# Patient Record
Sex: Male | Born: 1955
Health system: Southern US, Community
[De-identification: ages and names within clinical notes are randomized; demographics above are authoritative.]

## PROBLEM LIST (undated history)

## (undated) DIAGNOSIS — F319 Bipolar disorder, unspecified: Secondary | ICD-10-CM

## (undated) DIAGNOSIS — I509 Heart failure, unspecified: Secondary | ICD-10-CM

## (undated) DIAGNOSIS — F419 Anxiety disorder, unspecified: Secondary | ICD-10-CM

## (undated) DIAGNOSIS — F329 Major depressive disorder, single episode, unspecified: Secondary | ICD-10-CM

## (undated) DIAGNOSIS — B1910 Unspecified viral hepatitis B without hepatic coma: Secondary | ICD-10-CM

## (undated) DIAGNOSIS — J439 Emphysema, unspecified: Secondary | ICD-10-CM

## (undated) DIAGNOSIS — F1910 Other psychoactive substance abuse, uncomplicated: Secondary | ICD-10-CM

## (undated) DIAGNOSIS — E785 Hyperlipidemia, unspecified: Secondary | ICD-10-CM

## (undated) DIAGNOSIS — I219 Acute myocardial infarction, unspecified: Secondary | ICD-10-CM

## (undated) DIAGNOSIS — R569 Unspecified convulsions: Secondary | ICD-10-CM

## (undated) DIAGNOSIS — F32A Depression, unspecified: Secondary | ICD-10-CM

## (undated) DIAGNOSIS — I639 Cerebral infarction, unspecified: Secondary | ICD-10-CM

## (undated) DIAGNOSIS — I10 Essential (primary) hypertension: Secondary | ICD-10-CM

## (undated) DIAGNOSIS — IMO0002 Reserved for concepts with insufficient information to code with codable children: Secondary | ICD-10-CM

## (undated) DIAGNOSIS — B1920 Unspecified viral hepatitis C without hepatic coma: Secondary | ICD-10-CM

## (undated) DIAGNOSIS — M1990 Unspecified osteoarthritis, unspecified site: Secondary | ICD-10-CM

## (undated) HISTORY — DX: Essential (primary) hypertension: I10

## (undated) HISTORY — DX: Major depressive disorder, single episode, unspecified: F32.9

## (undated) HISTORY — PX: FRACTURE SURGERY: SHX138

## (undated) HISTORY — DX: Other psychoactive substance abuse, uncomplicated: F19.10

## (undated) HISTORY — DX: Hyperlipidemia, unspecified: E78.5

## (undated) HISTORY — DX: Depression, unspecified: F32.A

## (undated) HISTORY — PX: HEMORRHOID SURGERY: SHX153

## (undated) HISTORY — DX: Bipolar disorder, unspecified: F31.9

## (undated) HISTORY — DX: Heart failure, unspecified: I50.9

## (undated) HISTORY — DX: Anxiety disorder, unspecified: F41.9

---

## 2001-09-24 ENCOUNTER — Encounter: Payer: Self-pay | Admitting: Emergency Medicine

## 2001-09-24 ENCOUNTER — Emergency Department (HOSPITAL_COMMUNITY): Admission: EM | Admit: 2001-09-24 | Discharge: 2001-09-24 | Payer: Self-pay | Admitting: Emergency Medicine

## 2003-05-16 ENCOUNTER — Emergency Department (HOSPITAL_COMMUNITY): Admission: EM | Admit: 2003-05-16 | Discharge: 2003-05-16 | Payer: Self-pay | Admitting: Emergency Medicine

## 2003-05-28 ENCOUNTER — Emergency Department (HOSPITAL_COMMUNITY): Admission: AD | Admit: 2003-05-28 | Discharge: 2003-05-28 | Payer: Self-pay | Admitting: Family Medicine

## 2004-10-07 ENCOUNTER — Emergency Department (HOSPITAL_COMMUNITY): Admission: EM | Admit: 2004-10-07 | Discharge: 2004-10-07 | Payer: Self-pay | Admitting: Emergency Medicine

## 2004-10-10 ENCOUNTER — Emergency Department (HOSPITAL_COMMUNITY): Admission: EM | Admit: 2004-10-10 | Discharge: 2004-10-10 | Payer: Self-pay | Admitting: Emergency Medicine

## 2005-02-14 ENCOUNTER — Emergency Department: Payer: Self-pay | Admitting: Emergency Medicine

## 2005-02-15 ENCOUNTER — Other Ambulatory Visit: Payer: Self-pay

## 2005-10-16 ENCOUNTER — Ambulatory Visit: Payer: Self-pay | Admitting: Cardiovascular Disease

## 2006-09-21 ENCOUNTER — Ambulatory Visit: Payer: Self-pay | Admitting: Internal Medicine

## 2006-12-14 ENCOUNTER — Ambulatory Visit: Payer: Self-pay | Admitting: Internal Medicine

## 2007-01-05 ENCOUNTER — Ambulatory Visit: Payer: Self-pay | Admitting: Cardiovascular Disease

## 2007-04-19 ENCOUNTER — Ambulatory Visit: Payer: Self-pay | Admitting: Internal Medicine

## 2007-05-13 ENCOUNTER — Emergency Department: Payer: Self-pay | Admitting: Emergency Medicine

## 2007-05-13 ENCOUNTER — Other Ambulatory Visit: Payer: Self-pay

## 2008-06-20 ENCOUNTER — Ambulatory Visit: Payer: Self-pay | Admitting: Cardiovascular Disease

## 2010-09-13 ENCOUNTER — Observation Stay (HOSPITAL_COMMUNITY)
Admission: EM | Admit: 2010-09-13 | Discharge: 2010-09-14 | Disposition: A | Discharge status: Home / Self Care | Payer: PRIVATE HEALTH INSURANCE | Source: Ambulatory Visit | Attending: Internal Medicine | Admitting: Internal Medicine

## 2010-09-13 ENCOUNTER — Emergency Department (HOSPITAL_COMMUNITY): Payer: PRIVATE HEALTH INSURANCE

## 2010-09-13 DIAGNOSIS — R0602 Shortness of breath: Secondary | ICD-10-CM | POA: Insufficient documentation

## 2010-09-13 DIAGNOSIS — Z9861 Coronary angioplasty status: Secondary | ICD-10-CM | POA: Insufficient documentation

## 2010-09-13 DIAGNOSIS — I2510 Atherosclerotic heart disease of native coronary artery without angina pectoris: Secondary | ICD-10-CM | POA: Insufficient documentation

## 2010-09-13 DIAGNOSIS — F17200 Nicotine dependence, unspecified, uncomplicated: Secondary | ICD-10-CM | POA: Insufficient documentation

## 2010-09-13 DIAGNOSIS — J449 Chronic obstructive pulmonary disease, unspecified: Secondary | ICD-10-CM | POA: Insufficient documentation

## 2010-09-13 DIAGNOSIS — R0789 Other chest pain: Principal | ICD-10-CM | POA: Insufficient documentation

## 2010-09-13 DIAGNOSIS — R110 Nausea: Secondary | ICD-10-CM | POA: Insufficient documentation

## 2010-09-13 DIAGNOSIS — B1920 Unspecified viral hepatitis C without hepatic coma: Secondary | ICD-10-CM | POA: Insufficient documentation

## 2010-09-13 DIAGNOSIS — J4489 Other specified chronic obstructive pulmonary disease: Secondary | ICD-10-CM | POA: Insufficient documentation

## 2010-09-13 DIAGNOSIS — E785 Hyperlipidemia, unspecified: Secondary | ICD-10-CM | POA: Insufficient documentation

## 2010-09-13 DIAGNOSIS — F411 Generalized anxiety disorder: Secondary | ICD-10-CM | POA: Insufficient documentation

## 2010-09-13 DIAGNOSIS — Z79899 Other long term (current) drug therapy: Secondary | ICD-10-CM | POA: Insufficient documentation

## 2010-09-13 LAB — BASIC METABOLIC PANEL
BUN: 10 mg/dL (ref 6–23)
CO2: 21 mEq/L (ref 19–32)
Calcium: 9.8 mg/dL (ref 8.4–10.5)
Chloride: 108 mEq/L (ref 96–112)
Creatinine, Ser: 0.92 mg/dL (ref 0.4–1.5)
GFR calc Af Amer: >60 mL/min (ref >60)
GFR calc non Af Amer: >60 mL/min (ref >60)
Glucose, Bld: 118 mg/dL — ABNORMAL HIGH (ref 70–99)
Potassium: 3.8 mEq/L (ref 3.5–5.1)
Sodium: 141 mEq/L (ref 135–145)

## 2010-09-13 LAB — DIFFERENTIAL
Basophils Absolute: 0.1 10*3/uL (ref 0.0–0.1)
Basophils Relative: 1 % (ref 0–1)
Eosinophils Absolute: 0.2 10*3/uL (ref 0.0–0.7)
Eosinophils Relative: 2 % (ref 0–5)
Lymphocytes Relative: 13 % (ref 12–46)
Lymphs Abs: 1.4 10*3/uL (ref 0.7–4.0)
Monocytes Absolute: 1 10*3/uL (ref 0.1–1.0)
Monocytes Relative: 9 % (ref 3–12)
Neutro Abs: 8.2 10*3/uL — ABNORMAL HIGH (ref 1.7–7.7)
Neutrophils Relative %: 75 % (ref 43–77)

## 2010-09-13 LAB — POCT CARDIAC MARKERS
CKMB, poc: <1 ng/mL — ABNORMAL LOW (ref 1.0–8.0)
Myoglobin, poc: 287 ng/mL (ref 12–200)
Troponin i, poc: <0.05 ng/mL (ref 0.00–0.09)

## 2010-09-13 LAB — COMPREHENSIVE METABOLIC PANEL
ALT: 85 U/L — ABNORMAL HIGH (ref 0–53)
AST: 76 U/L — ABNORMAL HIGH (ref 0–37)
Albumin: 3.8 g/dL (ref 3.5–5.2)
Alkaline Phosphatase: 57 U/L (ref 39–117)
BUN: 9 mg/dL (ref 6–23)
CO2: 22 mEq/L (ref 19–32)
Calcium: 8.8 mg/dL (ref 8.4–10.5)
Chloride: 110 mEq/L (ref 96–112)
Creatinine, Ser: 0.78 mg/dL (ref 0.4–1.5)
GFR calc Af Amer: >60 mL/min (ref >60)
GFR calc non Af Amer: >60 mL/min (ref >60)
Glucose, Bld: 106 mg/dL — ABNORMAL HIGH (ref 70–99)
Potassium: 3.5 mEq/L (ref 3.5–5.1)
Sodium: 137 mEq/L (ref 135–145)
Total Bilirubin: 1.2 mg/dL (ref 0.3–1.2)
Total Protein: 6.4 g/dL (ref 6.0–8.3)

## 2010-09-13 LAB — URINALYSIS, ROUTINE W REFLEX MICROSCOPIC
Glucose, UA: NEGATIVE mg/dL
Hgb urine dipstick: NEGATIVE
Ketones, ur: 15 mg/dL — AB
Nitrite: NEGATIVE
Protein, ur: NEGATIVE mg/dL
Specific Gravity, Urine: 1.02 (ref 1.005–1.030)
Urobilinogen, UA: 1 mg/dL (ref 0.0–1.0)
pH: 5.5 (ref 5.0–8.0)

## 2010-09-13 LAB — CBC
HCT: 45 % (ref 39.0–52.0)
Hemoglobin: 16.1 g/dL (ref 13.0–17.0)
MCH: 31.4 pg (ref 26.0–34.0)
MCHC: 35.8 g/dL (ref 30.0–36.0)
MCV: 87.9 fL (ref 78.0–100.0)
Platelets: 217 10*3/uL (ref 150–400)
RBC: 5.12 MIL/uL (ref 4.22–5.81)
RDW: 12.7 % (ref 11.5–15.5)
WBC: 10.8 10*3/uL — ABNORMAL HIGH (ref 4.0–10.5)

## 2010-09-13 LAB — HEMOGLOBIN A1C
Hgb A1c MFr Bld: 5.7 % — ABNORMAL HIGH (ref <5.7)
Mean Plasma Glucose: 117 mg/dL — ABNORMAL HIGH (ref <117)

## 2010-09-13 LAB — CK TOTAL AND CKMB (NOT AT ARMC)
CK, MB: 1.3 ng/mL (ref 0.3–4.0)
Relative Index: 0.6 (ref 0.0–2.5)
Total CK: 211 U/L (ref 7–232)

## 2010-09-13 LAB — TROPONIN I: Troponin I: 0.01 ng/mL (ref 0.00–0.06)

## 2010-09-14 LAB — LIPID PANEL
Cholesterol: 119 mg/dL (ref 0–200)
HDL: 21 mg/dL — ABNORMAL LOW (ref >39)
LDL Cholesterol: 77 mg/dL (ref 0–99)
Total CHOL/HDL Ratio: 5.7 RATIO
Triglycerides: 107 mg/dL (ref <150)
VLDL: 21 mg/dL (ref 0–40)

## 2010-09-14 LAB — CARDIAC PANEL(CRET KIN+CKTOT+MB+TROPI)
CK, MB: 2.8 ng/mL (ref 0.3–4.0)
Relative Index: 1.1 (ref 0.0–2.5)
Total CK: 245 U/L — ABNORMAL HIGH (ref 7–232)
Troponin I: 0.01 ng/mL (ref 0.00–0.06)

## 2010-09-14 LAB — RAPID URINE DRUG SCREEN, HOSP PERFORMED
Amphetamines: NOT DETECTED
Barbiturates: NOT DETECTED
Benzodiazepines: POSITIVE — AB
Cocaine: NOT DETECTED
Opiates: POSITIVE — AB
Tetrahydrocannabinol: POSITIVE — AB

## 2010-09-23 NOTE — Discharge Summary (Signed)
[name], [name]               ACCOUNT NO.:  [id number]  MEDICAL RECORD NO.:  [id number]           PATIENT TYPE:  O  LOCATION:  3704                         FACILITY:  [facility]  PHYSICIAN:  Erick Blinks, MD     DATE OF BIRTH:  03-30-1956  DATE OF ADMISSION:  09/13/2010 DATE OF DISCHARGE:  09/14/2010                              DISCHARGE SUMMARY   PRIMARY CARE PHYSICIAN:  Optician, dispensing in [place].  DISCHARGE DIAGNOSES: 1. Atypical chest pain, resolved. 2. Chronic obstructive pulmonary disease. 3. Coronary artery disease, status post stents in the past. 4. History of hepatitis C. 5. History of polysubstance abuse. 6. Anxiety. 7. Hyperlipidemia. 8. Tobacco abuse.  DISCHARGE MEDICATIONS:  Unchanged from admission medications and are as follows. 1. Crestor 40 mg p.o. daily. 2. Lunesta 3 mg p.o. nightly p.r.n. 3. Lisinopril 2.5 mg p.o. daily. 4. Albuterol inhaler 2 puffs inhaled q.4 h. p.r.n. 5. Advair 250/50 one puff inhaled t.i.d. 6. Tramadol 50 mg 1 tablet p.o. t.i.d. p.r.n. 7. Mirtazapine 15 mg p.o. nightly. 8. Buspirone 15 mg 1 tablet p.o. t.i.d. 9. Xanax 1 mg 1 tablet p.o. t.i.d. p.r.n. 10.Coreg 6.25 mg 1 tablet p.o. b.i.d. 11.Lexapro 20 mg 1 tablet p.o. daily. 12.Plavix 75 mg p.o. daily. 13.Nitroglycerin sublingual 0.4 mg 1 tablet sublingual q.5 minutes     p.r.n. x3 for chest pain.  ADMISSION HISTORY:  This is a 55 year old gentleman who was brought to the emergency room with chest pain.  The patient reports that he had been walking in the sun for approximately 3-4 miles.  He went to sit down in the shade and started to develop substernal chest pain, which he reports as may have been radiating to his left arm.  The episode lasted a few hours and resolved after he was in the hospital.  He does report having similar episode when he had his heart attack in the past, although this was not as severe.  He does report some muscular soreness in his chest right  now as well, but has not had any recurrence of his pain since his admission.  The patient was admitted to rule out any an ACS.  For further details, please refer to history and physical dictated by Dr. Jomarie Longs on September 13, 2010.  HOSPITAL COURSE: 1. Chest pain.  The patient did not have any acute changes on his EKG.     His cardiac enzymes were found to be negative.  He has not had any     recurrence of pain since his admission.  The case was discussed     with Dr. Yates Decamp and EKGs were reviewed.  The patient has also     reportedly had a stress test that was found to be normal in the     past 1 year.  He currently does not have any pain, and he is     requesting to be discharged home.  With his history of coronary     artery disease, he will always have a risk for cardiac chest pain,     although his symptoms are somewhat atypical.  We cannot completely  rule out exertional angina.  The patient is adamantly requesting to     be discharged home.  We will have him follow up with Dr. Jacinto Halim next     week for possible repeat stress testing, but at this time, he     appears stable to be discharged home.  He is advised to return to     the emergency room if he has any recurrence of his chest pain, and     he does report having nitroglycerin at home if his pain recurs. 2. Remainder of the patient's medical issues have been stable.  CONSULTATIONS:  Cardiology, Cristy Hilts. Jacinto Halim, MD  PROCEDURES:  None.  DIAGNOSTIC IMAGING:  Chest x-ray from September 13, 2010, shows no active lung disease.  DISCHARGE INSTRUCTIONS:  The patient should continue on a heart-healthy diet, conduct his activities tolerated.  He will be following up with Dr. Jacinto Halim next week.  He has been given the number to his office as well as Dr. Verl Dicker office will be calling him with an appointment.  He has been advised to return to the emergency room if has recurrence of his pain.  The patient understands this and  agrees.  Condition at the time of discharge is improved.     Erick Blinks, MD     JM/MEDQ  D:  09/14/2010  T:  09/15/2010  Job:  [id number]  cc:   Cristy Hilts. Jacinto Halim, MD  Electronically Signed by Erick Blinks  on 09/23/2010 02:58:19 PM
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
## 2011-02-14 ENCOUNTER — Emergency Department (HOSPITAL_COMMUNITY): Payer: PRIVATE HEALTH INSURANCE

## 2011-02-14 ENCOUNTER — Emergency Department (HOSPITAL_COMMUNITY)
Admission: EM | Admit: 2011-02-14 | Discharge: 2011-02-14 | Disposition: A | Discharge status: Home / Self Care | Payer: PRIVATE HEALTH INSURANCE | Attending: Emergency Medicine | Admitting: Emergency Medicine

## 2011-02-14 DIAGNOSIS — Z7901 Long term (current) use of anticoagulants: Secondary | ICD-10-CM | POA: Insufficient documentation

## 2011-02-14 DIAGNOSIS — Z8619 Personal history of other infectious and parasitic diseases: Secondary | ICD-10-CM | POA: Insufficient documentation

## 2011-02-14 DIAGNOSIS — I252 Old myocardial infarction: Secondary | ICD-10-CM | POA: Insufficient documentation

## 2011-02-14 DIAGNOSIS — R51 Headache: Secondary | ICD-10-CM | POA: Insufficient documentation

## 2011-02-14 DIAGNOSIS — M542 Cervicalgia: Secondary | ICD-10-CM | POA: Insufficient documentation

## 2011-02-14 DIAGNOSIS — Y92410 Unspecified street and highway as the place of occurrence of the external cause: Secondary | ICD-10-CM | POA: Insufficient documentation

## 2011-02-14 DIAGNOSIS — I2510 Atherosclerotic heart disease of native coronary artery without angina pectoris: Secondary | ICD-10-CM | POA: Insufficient documentation

## 2011-02-14 DIAGNOSIS — S0990XA Unspecified injury of head, initial encounter: Secondary | ICD-10-CM | POA: Insufficient documentation

## 2011-02-14 DIAGNOSIS — IMO0002 Reserved for concepts with insufficient information to code with codable children: Secondary | ICD-10-CM | POA: Insufficient documentation

## 2011-04-15 ENCOUNTER — Other Ambulatory Visit: Payer: Self-pay | Admitting: Family Medicine

## 2011-04-15 DIAGNOSIS — I739 Peripheral vascular disease, unspecified: Secondary | ICD-10-CM

## 2011-04-18 ENCOUNTER — Ambulatory Visit
Admission: RE | Admit: 2011-04-18 | Discharge: 2011-04-18 | Disposition: A | Discharge status: Home / Self Care | Payer: PRIVATE HEALTH INSURANCE | Source: Ambulatory Visit | Attending: Family Medicine | Admitting: Family Medicine

## 2011-04-18 DIAGNOSIS — I739 Peripheral vascular disease, unspecified: Secondary | ICD-10-CM

## 2011-09-30 ENCOUNTER — Emergency Department (HOSPITAL_COMMUNITY): Payer: PRIVATE HEALTH INSURANCE

## 2011-09-30 ENCOUNTER — Emergency Department (HOSPITAL_COMMUNITY)
Admission: EM | Admit: 2011-09-30 | Discharge: 2011-09-30 | Disposition: A | Discharge status: Home / Self Care | Payer: PRIVATE HEALTH INSURANCE | Attending: Emergency Medicine | Admitting: Emergency Medicine

## 2011-09-30 ENCOUNTER — Encounter (HOSPITAL_COMMUNITY): Payer: Self-pay

## 2011-09-30 DIAGNOSIS — M79609 Pain in unspecified limb: Secondary | ICD-10-CM | POA: Insufficient documentation

## 2011-09-30 DIAGNOSIS — Z7982 Long term (current) use of aspirin: Secondary | ICD-10-CM | POA: Insufficient documentation

## 2011-09-30 DIAGNOSIS — Z79899 Other long term (current) drug therapy: Secondary | ICD-10-CM | POA: Insufficient documentation

## 2011-09-30 DIAGNOSIS — M25476 Effusion, unspecified foot: Secondary | ICD-10-CM | POA: Insufficient documentation

## 2011-09-30 DIAGNOSIS — S9000XA Contusion of unspecified ankle, initial encounter: Secondary | ICD-10-CM

## 2011-09-30 DIAGNOSIS — M129 Arthropathy, unspecified: Secondary | ICD-10-CM | POA: Insufficient documentation

## 2011-09-30 DIAGNOSIS — I252 Old myocardial infarction: Secondary | ICD-10-CM | POA: Insufficient documentation

## 2011-09-30 DIAGNOSIS — R269 Unspecified abnormalities of gait and mobility: Secondary | ICD-10-CM | POA: Insufficient documentation

## 2011-09-30 DIAGNOSIS — M25579 Pain in unspecified ankle and joints of unspecified foot: Secondary | ICD-10-CM | POA: Insufficient documentation

## 2011-09-30 DIAGNOSIS — M7989 Other specified soft tissue disorders: Secondary | ICD-10-CM | POA: Insufficient documentation

## 2011-09-30 DIAGNOSIS — M25473 Effusion, unspecified ankle: Secondary | ICD-10-CM | POA: Insufficient documentation

## 2011-09-30 HISTORY — DX: Unspecified osteoarthritis, unspecified site: M19.90

## 2011-09-30 HISTORY — DX: Acute myocardial infarction, unspecified: I21.9

## 2011-09-30 MED ORDER — OXYCODONE-ACETAMINOPHEN 5-325 MG PO TABS
2.0000 | ORAL_TABLET | Freq: Once | ORAL | Status: AC
  Administered 2011-09-30: 2 via ORAL
  Filled 2011-09-30: qty 2

## 2011-09-30 MED ORDER — NAPROXEN 500 MG PO TABS: 500.0000 mg | ORAL_TABLET | Freq: Two times a day (BID) | ORAL | Status: AC

## 2011-09-30 MED ORDER — IBUPROFEN 800 MG PO TABS
800.0000 mg | ORAL_TABLET | Freq: Once | ORAL | Status: AC
  Administered 2011-09-30: 800 mg via ORAL
  Filled 2011-09-30: qty 1

## 2011-09-30 MED ORDER — OXYCODONE-ACETAMINOPHEN 5-325 MG PO TABS
1.0000 | ORAL_TABLET | Freq: Four times a day (QID) | ORAL | Status: AC | PRN
Start: 2011-09-30 — End: 2011-10-10

## 2011-09-30 NOTE — Discharge Instructions (Signed)
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
## 2012-03-25 ENCOUNTER — Emergency Department: Payer: Self-pay | Admitting: Emergency Medicine

## 2012-03-25 LAB — BASIC METABOLIC PANEL
Anion Gap: 9 (ref 7–16)
BUN: 10 mg/dL (ref 7–18)
Calcium, Total: 8.7 mg/dL (ref 8.5–10.1)
Chloride: 109 mmol/L — ABNORMAL HIGH (ref 98–107)
Co2: 23 mmol/L (ref 21–32)
Creatinine: 1.01 mg/dL (ref 0.60–1.30)
EGFR (African American): >60
EGFR (Non-African Amer.): >60
Glucose: 186 mg/dL — ABNORMAL HIGH (ref 65–99)
Osmolality: 285 (ref 275–301)
Potassium: 3.8 mmol/L (ref 3.5–5.1)
Sodium: 141 mmol/L (ref 136–145)

## 2012-03-25 LAB — CBC
HCT: 42.5 % (ref 40.0–52.0)
HGB: 14.8 g/dL (ref 13.0–18.0)
MCH: 33.3 pg (ref 26.0–34.0)
MCHC: 34.8 g/dL (ref 32.0–36.0)
MCV: 96 fL (ref 80–100)
Platelet: 80 10*3/uL — ABNORMAL LOW (ref 150–440)
RBC: 4.44 10*6/uL (ref 4.40–5.90)
RDW: 15.5 % — ABNORMAL HIGH (ref 11.5–14.5)
WBC: 4.8 10*3/uL (ref 3.8–10.6)

## 2012-03-25 LAB — TROPONIN I: Troponin-I: 0.04 ng/mL

## 2012-10-14 ENCOUNTER — Inpatient Hospital Stay: Payer: Self-pay | Admitting: Internal Medicine

## 2012-10-14 LAB — CBC
HCT: 40.4 % (ref 40.0–52.0)
HGB: 14.1 g/dL (ref 13.0–18.0)
MCH: 32.9 pg (ref 26.0–34.0)
MCHC: 34.9 g/dL (ref 32.0–36.0)
MCV: 94 fL (ref 80–100)
Platelet: 65 10*3/uL — ABNORMAL LOW (ref 150–440)
RBC: 4.29 10*6/uL — ABNORMAL LOW (ref 4.40–5.90)
RDW: 14.9 % — ABNORMAL HIGH (ref 11.5–14.5)
WBC: 6.2 10*3/uL (ref 3.8–10.6)

## 2012-10-14 LAB — COMPREHENSIVE METABOLIC PANEL
Albumin: 3.5 g/dL (ref 3.4–5.0)
Alkaline Phosphatase: 74 U/L (ref 50–136)
Anion Gap: 4 — ABNORMAL LOW (ref 7–16)
BUN: 12 mg/dL (ref 7–18)
Bilirubin,Total: 0.7 mg/dL (ref 0.2–1.0)
Calcium, Total: 8.9 mg/dL (ref 8.5–10.1)
Chloride: 111 mmol/L — ABNORMAL HIGH (ref 98–107)
Co2: 28 mmol/L (ref 21–32)
Creatinine: 0.87 mg/dL (ref 0.60–1.30)
EGFR (African American): >60
EGFR (Non-African Amer.): >60
Glucose: 86 mg/dL (ref 65–99)
Osmolality: 284 (ref 275–301)
Potassium: 3.2 mmol/L — ABNORMAL LOW (ref 3.5–5.1)
SGOT(AST): 44 U/L — ABNORMAL HIGH (ref 15–37)
SGPT (ALT): 33 U/L (ref 12–78)
Sodium: 143 mmol/L (ref 136–145)
Total Protein: 6.3 g/dL — ABNORMAL LOW (ref 6.4–8.2)

## 2012-10-14 LAB — DRUG SCREEN, URINE
Amphetamines, Ur Screen: NEGATIVE (ref ?–1000)
Barbiturates, Ur Screen: NEGATIVE (ref ?–200)
Benzodiazepine, Ur Scrn: POSITIVE (ref ?–200)
Cannabinoid 50 Ng, Ur ~~LOC~~: POSITIVE (ref ?–50)
Cocaine Metabolite,Ur ~~LOC~~: NEGATIVE (ref ?–300)
MDMA (Ecstasy)Ur Screen: NEGATIVE (ref ?–500)
Methadone, Ur Screen: NEGATIVE (ref ?–300)
Opiate, Ur Screen: NEGATIVE (ref ?–300)
Phencyclidine (PCP) Ur S: NEGATIVE (ref ?–25)
Tricyclic, Ur Screen: NEGATIVE (ref ?–1000)

## 2012-10-14 LAB — URINALYSIS, COMPLETE
Bilirubin,UR: NEGATIVE
Blood: NEGATIVE
Glucose,UR: NEGATIVE mg/dL (ref 0–75)
Ketone: NEGATIVE
Leukocyte Esterase: NEGATIVE
Nitrite: NEGATIVE
Ph: 6 (ref 4.5–8.0)
Protein: NEGATIVE
RBC,UR: 1 /HPF (ref 0–5)
Specific Gravity: 1.004 (ref 1.003–1.030)
Squamous Epithelial: NONE SEEN
WBC UR: 1 /HPF (ref 0–5)

## 2012-10-14 LAB — ACETAMINOPHEN LEVEL: Acetaminophen: <2 ug/mL

## 2012-10-14 LAB — TROPONIN I
Troponin-I: <0.02 ng/mL
Troponin-I: <0.02 ng/mL

## 2012-10-14 LAB — TSH: Thyroid Stimulating Horm: 4.17 u[IU]/mL

## 2012-10-14 LAB — CK-MB
CK-MB: 1.5 ng/mL (ref 0.5–3.6)
CK-MB: 1.3 ng/mL (ref 0.5–3.6)

## 2012-10-14 LAB — ETHANOL
Ethanol %: <0.003 % (ref 0.000–0.080)
Ethanol: <3 mg/dL

## 2012-10-14 LAB — SALICYLATE LEVEL: Salicylates, Serum: <1.7 mg/dL

## 2012-10-15 LAB — CK-MB: CK-MB: 0.9 ng/mL (ref 0.5–3.6)

## 2012-10-15 LAB — BASIC METABOLIC PANEL
Anion Gap: 4 — ABNORMAL LOW (ref 7–16)
BUN: 10 mg/dL (ref 7–18)
Calcium, Total: 8.2 mg/dL — ABNORMAL LOW (ref 8.5–10.1)
Chloride: 114 mmol/L — ABNORMAL HIGH (ref 98–107)
Co2: 27 mmol/L (ref 21–32)
Creatinine: 0.69 mg/dL (ref 0.60–1.30)
EGFR (African American): >60
EGFR (Non-African Amer.): >60
Glucose: 85 mg/dL (ref 65–99)
Osmolality: 287 (ref 275–301)
Potassium: 3.6 mmol/L (ref 3.5–5.1)
Sodium: 145 mmol/L (ref 136–145)

## 2012-10-15 LAB — TROPONIN I: Troponin-I: <0.02 ng/mL

## 2012-10-22 ENCOUNTER — Emergency Department: Payer: Self-pay | Admitting: Emergency Medicine

## 2012-10-22 LAB — URINALYSIS, COMPLETE
Bilirubin,UR: NEGATIVE
Blood: NEGATIVE
Glucose,UR: NEGATIVE mg/dL (ref 0–75)
Ketone: NEGATIVE
Leukocyte Esterase: NEGATIVE
Nitrite: NEGATIVE
Ph: 5 (ref 4.5–8.0)
Protein: 30
RBC,UR: 21 /HPF (ref 0–5)
Specific Gravity: 1.015 (ref 1.003–1.030)
Squamous Epithelial: 1
WBC UR: 14 /HPF (ref 0–5)

## 2012-10-22 LAB — CBC
HCT: 39.8 % — ABNORMAL LOW (ref 40.0–52.0)
HGB: 13.9 g/dL (ref 13.0–18.0)
MCH: 32.8 pg (ref 26.0–34.0)
MCHC: 34.8 g/dL (ref 32.0–36.0)
MCV: 94 fL (ref 80–100)
Platelet: 67 10*3/uL — ABNORMAL LOW (ref 150–440)
RBC: 4.22 10*6/uL — ABNORMAL LOW (ref 4.40–5.90)
RDW: 14.8 % — ABNORMAL HIGH (ref 11.5–14.5)
WBC: 4.7 10*3/uL (ref 3.8–10.6)

## 2012-10-22 LAB — COMPREHENSIVE METABOLIC PANEL
Albumin: 3.3 g/dL — ABNORMAL LOW (ref 3.4–5.0)
Alkaline Phosphatase: 79 U/L (ref 50–136)
Anion Gap: 7 (ref 7–16)
BUN: 8 mg/dL (ref 7–18)
Bilirubin,Total: 0.4 mg/dL (ref 0.2–1.0)
Calcium, Total: 8.6 mg/dL (ref 8.5–10.1)
Chloride: 110 mmol/L — ABNORMAL HIGH (ref 98–107)
Co2: 24 mmol/L (ref 21–32)
Creatinine: 0.76 mg/dL (ref 0.60–1.30)
EGFR (African American): >60
EGFR (Non-African Amer.): >60
Glucose: 137 mg/dL — ABNORMAL HIGH (ref 65–99)
Osmolality: 282 (ref 275–301)
Potassium: 5.4 mmol/L — ABNORMAL HIGH (ref 3.5–5.1)
SGOT(AST): 79 U/L — ABNORMAL HIGH (ref 15–37)
SGPT (ALT): 41 U/L (ref 12–78)
Sodium: 141 mmol/L (ref 136–145)
Total Protein: 6.4 g/dL (ref 6.4–8.2)

## 2012-10-22 LAB — DRUG SCREEN, URINE

## 2012-10-22 LAB — ETHANOL
Ethanol %: <0.003 % (ref 0.000–0.080)
Ethanol: <3 mg/dL

## 2012-10-22 LAB — TROPONIN I: Troponin-I: <0.02 ng/mL

## 2012-11-18 ENCOUNTER — Emergency Department: Payer: Self-pay | Admitting: Emergency Medicine

## 2012-11-18 LAB — PRO B NATRIURETIC PEPTIDE: B-Type Natriuretic Peptide: 85 pg/mL (ref 0–125)

## 2012-11-18 LAB — CBC
HCT: 38.3 % — ABNORMAL LOW (ref 40.0–52.0)
HGB: 13.3 g/dL (ref 13.0–18.0)
MCH: 33.1 pg (ref 26.0–34.0)
MCHC: 34.7 g/dL (ref 32.0–36.0)
MCV: 95 fL (ref 80–100)
Platelet: 65 10*3/uL — ABNORMAL LOW (ref 150–440)
RBC: 4.02 10*6/uL — ABNORMAL LOW (ref 4.40–5.90)
RDW: 14.4 % (ref 11.5–14.5)
WBC: 6.1 10*3/uL (ref 3.8–10.6)

## 2012-11-18 LAB — COMPREHENSIVE METABOLIC PANEL
Albumin: 3.2 g/dL — ABNORMAL LOW (ref 3.4–5.0)
Alkaline Phosphatase: 100 U/L (ref 50–136)
Anion Gap: 6 — ABNORMAL LOW (ref 7–16)
BUN: 9 mg/dL (ref 7–18)
Bilirubin,Total: 1.2 mg/dL — ABNORMAL HIGH (ref 0.2–1.0)
Calcium, Total: 8.4 mg/dL — ABNORMAL LOW (ref 8.5–10.1)
Chloride: 108 mmol/L — ABNORMAL HIGH (ref 98–107)
Co2: 27 mmol/L (ref 21–32)
Creatinine: 1.25 mg/dL (ref 0.60–1.30)
EGFR (African American): >60
EGFR (Non-African Amer.): >60
Glucose: 151 mg/dL — ABNORMAL HIGH (ref 65–99)
Osmolality: 283 (ref 275–301)
Potassium: 3.3 mmol/L — ABNORMAL LOW (ref 3.5–5.1)
SGOT(AST): 75 U/L — ABNORMAL HIGH (ref 15–37)
SGPT (ALT): 81 U/L — ABNORMAL HIGH (ref 12–78)
Sodium: 141 mmol/L (ref 136–145)
Total Protein: 6.1 g/dL — ABNORMAL LOW (ref 6.4–8.2)

## 2012-11-18 LAB — TROPONIN I: Troponin-I: <0.02 ng/mL

## 2012-11-20 ENCOUNTER — Inpatient Hospital Stay: Payer: Self-pay | Admitting: Internal Medicine

## 2012-11-20 LAB — COMPREHENSIVE METABOLIC PANEL
Albumin: 3.3 g/dL — ABNORMAL LOW (ref 3.4–5.0)
Alkaline Phosphatase: 93 U/L (ref 50–136)
Anion Gap: 5 — ABNORMAL LOW (ref 7–16)
BUN: 9 mg/dL (ref 7–18)
Bilirubin,Total: 1 mg/dL (ref 0.2–1.0)
Calcium, Total: 9.1 mg/dL (ref 8.5–10.1)
Chloride: 109 mmol/L — ABNORMAL HIGH (ref 98–107)
Co2: 27 mmol/L (ref 21–32)
Creatinine: 0.94 mg/dL (ref 0.60–1.30)
EGFR (African American): >60
EGFR (Non-African Amer.): >60
Glucose: 110 mg/dL — ABNORMAL HIGH (ref 65–99)
Osmolality: 281 (ref 275–301)
Potassium: 3.6 mmol/L (ref 3.5–5.1)
SGOT(AST): 69 U/L — ABNORMAL HIGH (ref 15–37)
SGPT (ALT): 75 U/L (ref 12–78)
Sodium: 141 mmol/L (ref 136–145)
Total Protein: 6.4 g/dL (ref 6.4–8.2)

## 2012-11-20 LAB — CBC WITH DIFFERENTIAL/PLATELET
Basophil #: 0 10*3/uL (ref 0.0–0.1)
Basophil %: 1 %
Eosinophil #: 0.2 10*3/uL (ref 0.0–0.7)
Eosinophil %: 5.1 %
HCT: 39.4 % — ABNORMAL LOW (ref 40.0–52.0)
HGB: 13.7 g/dL (ref 13.0–18.0)
Lymphocyte #: 1.4 10*3/uL (ref 1.0–3.6)
Lymphocyte %: 28.4 %
MCH: 32.9 pg (ref 26.0–34.0)
MCHC: 34.8 g/dL (ref 32.0–36.0)
MCV: 95 fL (ref 80–100)
Monocyte #: 0.7 x10 3/mm (ref 0.2–1.0)
Monocyte %: 14.3 %
Neutrophil #: 2.5 10*3/uL (ref 1.4–6.5)
Neutrophil %: 51.2 %
Platelet: 63 10*3/uL — ABNORMAL LOW (ref 150–440)
RBC: 4.16 10*6/uL — ABNORMAL LOW (ref 4.40–5.90)
RDW: 14.8 % — ABNORMAL HIGH (ref 11.5–14.5)
WBC: 4.9 10*3/uL (ref 3.8–10.6)

## 2012-11-21 LAB — CBC WITH DIFFERENTIAL/PLATELET
Basophil #: 0 10*3/uL (ref 0.0–0.1)
Basophil %: 1 %
Eosinophil #: 0.2 10*3/uL (ref 0.0–0.7)
Eosinophil %: 4.3 %
HCT: 38.8 % — ABNORMAL LOW (ref 40.0–52.0)
HGB: 13.3 g/dL (ref 13.0–18.0)
Lymphocyte #: 1.6 10*3/uL (ref 1.0–3.6)
Lymphocyte %: 33.6 %
MCH: 32.8 pg (ref 26.0–34.0)
MCHC: 34.4 g/dL (ref 32.0–36.0)
MCV: 96 fL (ref 80–100)
Monocyte #: 0.6 x10 3/mm (ref 0.2–1.0)
Monocyte %: 12.4 %
Neutrophil #: 2.3 10*3/uL (ref 1.4–6.5)
Neutrophil %: 48.7 %
Platelet: 60 10*3/uL — ABNORMAL LOW (ref 150–440)
RBC: 4.07 10*6/uL — ABNORMAL LOW (ref 4.40–5.90)
RDW: 14.8 % — ABNORMAL HIGH (ref 11.5–14.5)
WBC: 4.7 10*3/uL (ref 3.8–10.6)

## 2012-11-21 LAB — BASIC METABOLIC PANEL
Anion Gap: 4 — ABNORMAL LOW (ref 7–16)
BUN: 13 mg/dL (ref 7–18)
Calcium, Total: 8.9 mg/dL (ref 8.5–10.1)
Chloride: 106 mmol/L (ref 98–107)
Co2: 31 mmol/L (ref 21–32)
Creatinine: 1.01 mg/dL (ref 0.60–1.30)
EGFR (African American): >60
EGFR (Non-African Amer.): >60
Glucose: 97 mg/dL (ref 65–99)
Osmolality: 281 (ref 275–301)
Potassium: 3.4 mmol/L — ABNORMAL LOW (ref 3.5–5.1)
Sodium: 141 mmol/L (ref 136–145)

## 2012-11-21 LAB — TSH: Thyroid Stimulating Horm: 0.475 u[IU]/mL

## 2012-11-23 LAB — CULTURE, BLOOD (SINGLE)

## 2012-11-24 LAB — CULTURE, BLOOD (SINGLE)

## 2012-11-25 LAB — CULTURE, BLOOD (SINGLE)

## 2012-12-13 ENCOUNTER — Emergency Department: Payer: Self-pay | Admitting: Internal Medicine

## 2012-12-13 LAB — BASIC METABOLIC PANEL
Anion Gap: 5 — ABNORMAL LOW (ref 7–16)
BUN: 8 mg/dL (ref 7–18)
Calcium, Total: 8.4 mg/dL — ABNORMAL LOW (ref 8.5–10.1)
Chloride: 113 mmol/L — ABNORMAL HIGH (ref 98–107)
Co2: 25 mmol/L (ref 21–32)
Creatinine: 0.94 mg/dL (ref 0.60–1.30)
EGFR (African American): >60
EGFR (Non-African Amer.): >60
Glucose: 87 mg/dL (ref 65–99)
Osmolality: 283 (ref 275–301)
Potassium: 4.2 mmol/L (ref 3.5–5.1)
Sodium: 143 mmol/L (ref 136–145)

## 2012-12-24 ENCOUNTER — Emergency Department (HOSPITAL_COMMUNITY)
Admission: EM | Admit: 2012-12-24 | Discharge: 2012-12-24 | Disposition: A | Discharge status: Home / Self Care | Payer: PRIVATE HEALTH INSURANCE | Attending: Emergency Medicine | Admitting: Emergency Medicine

## 2012-12-24 ENCOUNTER — Emergency Department (HOSPITAL_COMMUNITY): Payer: PRIVATE HEALTH INSURANCE

## 2012-12-24 ENCOUNTER — Encounter (HOSPITAL_COMMUNITY): Payer: Self-pay | Admitting: *Deleted

## 2012-12-24 DIAGNOSIS — F17200 Nicotine dependence, unspecified, uncomplicated: Secondary | ICD-10-CM | POA: Insufficient documentation

## 2012-12-24 DIAGNOSIS — S298XXA Other specified injuries of thorax, initial encounter: Secondary | ICD-10-CM | POA: Insufficient documentation

## 2012-12-24 DIAGNOSIS — I252 Old myocardial infarction: Secondary | ICD-10-CM | POA: Insufficient documentation

## 2012-12-24 DIAGNOSIS — Y92410 Unspecified street and highway as the place of occurrence of the external cause: Secondary | ICD-10-CM | POA: Insufficient documentation

## 2012-12-24 DIAGNOSIS — Z79899 Other long term (current) drug therapy: Secondary | ICD-10-CM | POA: Insufficient documentation

## 2012-12-24 DIAGNOSIS — M79605 Pain in left leg: Secondary | ICD-10-CM

## 2012-12-24 DIAGNOSIS — Y9389 Activity, other specified: Secondary | ICD-10-CM | POA: Insufficient documentation

## 2012-12-24 DIAGNOSIS — M129 Arthropathy, unspecified: Secondary | ICD-10-CM | POA: Insufficient documentation

## 2012-12-24 DIAGNOSIS — Z7982 Long term (current) use of aspirin: Secondary | ICD-10-CM | POA: Insufficient documentation

## 2012-12-24 DIAGNOSIS — S8990XA Unspecified injury of unspecified lower leg, initial encounter: Secondary | ICD-10-CM | POA: Insufficient documentation

## 2012-12-24 DIAGNOSIS — Z8709 Personal history of other diseases of the respiratory system: Secondary | ICD-10-CM | POA: Insufficient documentation

## 2012-12-24 HISTORY — DX: Emphysema, unspecified: J43.9

## 2012-12-24 LAB — CBC
HCT: 41.3 % (ref 39.0–52.0)
Hemoglobin: 14.1 g/dL (ref 13.0–17.0)
MCH: 31.8 pg (ref 26.0–34.0)
MCHC: 34.1 g/dL (ref 30.0–36.0)
MCV: 93.2 fL (ref 78.0–100.0)
Platelets: 69 10*3/uL — ABNORMAL LOW (ref 150–400)
RBC: 4.43 MIL/uL (ref 4.22–5.81)
RDW: 15 % (ref 11.5–15.5)
WBC: 4.8 10*3/uL (ref 4.0–10.5)

## 2012-12-24 LAB — BASIC METABOLIC PANEL
BUN: 12 mg/dL (ref 6–23)
CO2: 24 mEq/L (ref 19–32)
Calcium: 8.7 mg/dL (ref 8.4–10.5)
Chloride: 105 mEq/L (ref 96–112)
Creatinine, Ser: 0.86 mg/dL (ref 0.50–1.35)
GFR calc Af Amer: >90 mL/min (ref >90)
GFR calc non Af Amer: >90 mL/min (ref >90)
Glucose, Bld: 93 mg/dL (ref 70–99)
Potassium: 4.2 mEq/L (ref 3.5–5.1)
Sodium: 138 mEq/L (ref 135–145)

## 2012-12-24 MED ORDER — FENTANYL CITRATE 0.05 MG/ML IJ SOLN
50.0000 ug | Freq: Once | INTRAMUSCULAR | Status: AC
  Administered 2012-12-24: 50 ug via INTRAVENOUS
  Filled 2012-12-24: qty 2

## 2012-12-24 MED ORDER — HYDROMORPHONE HCL PF 1 MG/ML IJ SOLN
1.0000 mg | Freq: Once | INTRAMUSCULAR | Status: AC
  Administered 2012-12-24: 1 mg via INTRAVENOUS
  Filled 2012-12-24: qty 1

## 2012-12-24 MED ORDER — OXYCODONE-ACETAMINOPHEN 5-325 MG PO TABS: 1.0000 | ORAL_TABLET | ORAL | Status: DC | PRN
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
## 2012-12-24 NOTE — ED Notes (Signed)
MD at bedside. 

## 2012-12-24 NOTE — ED Notes (Signed)
Dr. Campos at bedside   

## 2013-01-31 LAB — BASIC METABOLIC PANEL
Anion Gap: 8 (ref 7–16)
BUN: 38 mg/dL — ABNORMAL HIGH (ref 7–18)
Calcium, Total: 8.9 mg/dL (ref 8.5–10.1)
Chloride: 82 mmol/L — ABNORMAL LOW (ref 98–107)
Co2: 35 mmol/L — ABNORMAL HIGH (ref 21–32)
Creatinine: 1.64 mg/dL — ABNORMAL HIGH (ref 0.60–1.30)
EGFR (African American): 53 — ABNORMAL LOW
EGFR (Non-African Amer.): 45 — ABNORMAL LOW
Glucose: 248 mg/dL — ABNORMAL HIGH (ref 65–99)
Osmolality: 269 (ref 275–301)
Potassium: 2.4 mmol/L — CL (ref 3.5–5.1)
Sodium: 125 mmol/L — ABNORMAL LOW (ref 136–145)

## 2013-01-31 LAB — CBC
HCT: 45.9 % (ref 40.0–52.0)
HGB: 15.7 g/dL (ref 13.0–18.0)
MCH: 32.1 pg (ref 26.0–34.0)
MCHC: 34.2 g/dL (ref 32.0–36.0)
MCV: 94 fL (ref 80–100)
Platelet: 88 10*3/uL — ABNORMAL LOW (ref 150–440)
RBC: 4.89 10*6/uL (ref 4.40–5.90)
RDW: 15.6 % — ABNORMAL HIGH (ref 11.5–14.5)
WBC: 12.8 10*3/uL — ABNORMAL HIGH (ref 3.8–10.6)

## 2013-01-31 LAB — CK TOTAL AND CKMB (NOT AT ARMC)
CK, Total: 297 U/L — ABNORMAL HIGH (ref 35–232)
CK-MB: 1.9 ng/mL (ref 0.5–3.6)

## 2013-01-31 LAB — TROPONIN I: Troponin-I: <0.02 ng/mL

## 2013-02-01 ENCOUNTER — Observation Stay: Payer: Self-pay | Admitting: Internal Medicine

## 2013-02-01 LAB — ETHANOL
Ethanol %: <0.003 % (ref 0.000–0.080)
Ethanol: <3 mg/dL

## 2013-02-01 LAB — URINALYSIS, COMPLETE
Bacteria: NONE SEEN
Bilirubin,UR: NEGATIVE
Blood: NEGATIVE
Glucose,UR: NEGATIVE mg/dL (ref 0–75)
Ketone: NEGATIVE
Leukocyte Esterase: NEGATIVE
Nitrite: NEGATIVE
Ph: 6 (ref 4.5–8.0)
Protein: 30
RBC,UR: 1 /HPF (ref 0–5)
Specific Gravity: 1.023 (ref 1.003–1.030)
Squamous Epithelial: NONE SEEN
WBC UR: 6 /HPF (ref 0–5)

## 2013-02-01 LAB — BASIC METABOLIC PANEL
Anion Gap: 2 — ABNORMAL LOW (ref 7–16)
BUN: 28 mg/dL — ABNORMAL HIGH (ref 7–18)
Calcium, Total: 9.2 mg/dL (ref 8.5–10.1)
Chloride: 93 mmol/L — ABNORMAL LOW (ref 98–107)
Co2: 36 mmol/L — ABNORMAL HIGH (ref 21–32)
Creatinine: 1.11 mg/dL (ref 0.60–1.30)
EGFR (African American): >60
EGFR (Non-African Amer.): >60
Glucose: 134 mg/dL — ABNORMAL HIGH (ref 65–99)
Osmolality: 270 (ref 275–301)
Potassium: 3.2 mmol/L — ABNORMAL LOW (ref 3.5–5.1)
Sodium: 131 mmol/L — ABNORMAL LOW (ref 136–145)

## 2013-02-01 LAB — DRUG SCREEN, URINE

## 2013-02-01 LAB — CK TOTAL AND CKMB (NOT AT ARMC)
CK, Total: 220 U/L (ref 35–232)
CK, Total: 159 U/L (ref 35–232)
CK-MB: 2.6 ng/mL (ref 0.5–3.6)
CK-MB: 2.3 ng/mL (ref 0.5–3.6)

## 2013-02-01 LAB — TROPONIN I
Troponin-I: 0.02 ng/mL
Troponin-I: 0.02 ng/mL

## 2013-02-01 LAB — AMMONIA: Ammonia, Plasma: 57 mcmol/L — ABNORMAL HIGH (ref 11–32)

## 2013-02-02 LAB — BASIC METABOLIC PANEL
Anion Gap: 4 — ABNORMAL LOW (ref 7–16)
BUN: 19 mg/dL — ABNORMAL HIGH (ref 7–18)
Calcium, Total: 9.5 mg/dL (ref 8.5–10.1)
Chloride: 95 mmol/L — ABNORMAL LOW (ref 98–107)
Co2: 35 mmol/L — ABNORMAL HIGH (ref 21–32)
Creatinine: 1.13 mg/dL (ref 0.60–1.30)
EGFR (African American): >60
EGFR (Non-African Amer.): >60
Glucose: 124 mg/dL — ABNORMAL HIGH (ref 65–99)
Osmolality: 272 (ref 275–301)
Potassium: 3 mmol/L — ABNORMAL LOW (ref 3.5–5.1)
Sodium: 134 mmol/L — ABNORMAL LOW (ref 136–145)

## 2013-02-02 LAB — CBC WITH DIFFERENTIAL/PLATELET
Basophil #: 0.1 10*3/uL (ref 0.0–0.1)
Basophil %: 1.2 %
Eosinophil #: 0.3 10*3/uL (ref 0.0–0.7)
Eosinophil %: 3.5 %
HCT: 45.3 % (ref 40.0–52.0)
HGB: 15.8 g/dL (ref 13.0–18.0)
Lymphocyte #: 2.2 10*3/uL (ref 1.0–3.6)
Lymphocyte %: 27.7 %
MCH: 32.6 pg (ref 26.0–34.0)
MCHC: 34.8 g/dL (ref 32.0–36.0)
MCV: 94 fL (ref 80–100)
Monocyte #: 1.2 x10 3/mm — ABNORMAL HIGH (ref 0.2–1.0)
Monocyte %: 15 %
Neutrophil #: 4.1 10*3/uL (ref 1.4–6.5)
Neutrophil %: 52.6 %
Platelet: 74 10*3/uL — ABNORMAL LOW (ref 150–440)
RBC: 4.84 10*6/uL (ref 4.40–5.90)
RDW: 15.4 % — ABNORMAL HIGH (ref 11.5–14.5)
WBC: 7.8 10*3/uL (ref 3.8–10.6)

## 2013-02-02 LAB — AMMONIA: Ammonia, Plasma: <25 mcmol/L (ref 11–32)

## 2013-02-02 LAB — MAGNESIUM: Magnesium: 2.3 mg/dL

## 2013-06-08 ENCOUNTER — Emergency Department: Payer: Self-pay | Admitting: Emergency Medicine

## 2013-06-08 LAB — URINALYSIS, COMPLETE
Bacteria: NONE SEEN
Bilirubin,UR: NEGATIVE
Blood: NEGATIVE
Glucose,UR: NEGATIVE mg/dL (ref 0–75)
Ketone: NEGATIVE
Leukocyte Esterase: NEGATIVE
Nitrite: NEGATIVE
Ph: 5 (ref 4.5–8.0)
Protein: NEGATIVE
RBC,UR: <1 /HPF (ref 0–5)
Specific Gravity: 1.012 (ref 1.003–1.030)
Squamous Epithelial: <1
WBC UR: 1 /HPF (ref 0–5)

## 2013-06-08 LAB — COMPREHENSIVE METABOLIC PANEL
Albumin: 3.3 g/dL — ABNORMAL LOW (ref 3.4–5.0)
Alkaline Phosphatase: 102 U/L
Anion Gap: 6 — ABNORMAL LOW (ref 7–16)
BUN: 7 mg/dL (ref 7–18)
Bilirubin,Total: 1 mg/dL (ref 0.2–1.0)
Calcium, Total: 8.9 mg/dL (ref 8.5–10.1)
Chloride: 110 mmol/L — ABNORMAL HIGH (ref 98–107)
Co2: 26 mmol/L (ref 21–32)
Creatinine: 0.83 mg/dL (ref 0.60–1.30)
EGFR (African American): >60
EGFR (Non-African Amer.): >60
Glucose: 108 mg/dL — ABNORMAL HIGH (ref 65–99)
Osmolality: 282 (ref 275–301)
Potassium: 3.9 mmol/L (ref 3.5–5.1)
SGOT(AST): 145 U/L — ABNORMAL HIGH (ref 15–37)
SGPT (ALT): 120 U/L — ABNORMAL HIGH (ref 12–78)
Sodium: 142 mmol/L (ref 136–145)
Total Protein: 6.5 g/dL (ref 6.4–8.2)

## 2013-06-08 LAB — DRUG SCREEN, URINE
Amphetamines, Ur Screen: NEGATIVE (ref ?–1000)
Barbiturates, Ur Screen: NEGATIVE (ref ?–200)
Benzodiazepine, Ur Scrn: POSITIVE (ref ?–200)
Cannabinoid 50 Ng, Ur ~~LOC~~: POSITIVE (ref ?–50)
Cocaine Metabolite,Ur ~~LOC~~: NEGATIVE (ref ?–300)
MDMA (Ecstasy)Ur Screen: POSITIVE (ref ?–500)
Methadone, Ur Screen: NEGATIVE (ref ?–300)
Opiate, Ur Screen: NEGATIVE (ref ?–300)
Phencyclidine (PCP) Ur S: NEGATIVE (ref ?–25)
Tricyclic, Ur Screen: NEGATIVE (ref ?–1000)

## 2013-06-08 LAB — CK: CK, Total: 98 U/L (ref 35–232)

## 2013-06-08 LAB — CBC
HCT: 40.5 % (ref 40.0–52.0)
HGB: 13.9 g/dL (ref 13.0–18.0)
MCH: 33.6 pg (ref 26.0–34.0)
MCHC: 34.4 g/dL (ref 32.0–36.0)
MCV: 98 fL (ref 80–100)
Platelet: 56 10*3/uL — ABNORMAL LOW (ref 150–440)
RBC: 4.15 10*6/uL — ABNORMAL LOW (ref 4.40–5.90)
RDW: 14.8 % — ABNORMAL HIGH (ref 11.5–14.5)
WBC: 3.5 10*3/uL — ABNORMAL LOW (ref 3.8–10.6)

## 2013-06-08 LAB — ETHANOL
Ethanol %: <0.003 % (ref 0.000–0.080)
Ethanol: <3 mg/dL

## 2013-06-16 DIAGNOSIS — IMO0002 Reserved for concepts with insufficient information to code with codable children: Secondary | ICD-10-CM

## 2013-06-16 HISTORY — DX: Reserved for concepts with insufficient information to code with codable children: IMO0002

## 2013-07-01 ENCOUNTER — Emergency Department (HOSPITAL_COMMUNITY): Payer: PRIVATE HEALTH INSURANCE

## 2013-07-01 ENCOUNTER — Encounter (HOSPITAL_COMMUNITY): Payer: Self-pay | Admitting: Emergency Medicine

## 2013-07-01 ENCOUNTER — Inpatient Hospital Stay (HOSPITAL_COMMUNITY)
Admission: EM | Admit: 2013-07-01 | Discharge: 2013-07-04 | DRG: 512 | Disposition: A | Discharge status: Home Health | Payer: PRIVATE HEALTH INSURANCE | Attending: Internal Medicine | Admitting: Internal Medicine

## 2013-07-01 DIAGNOSIS — IMO0002 Reserved for concepts with insufficient information to code with codable children: Secondary | ICD-10-CM

## 2013-07-01 DIAGNOSIS — J449 Chronic obstructive pulmonary disease, unspecified: Secondary | ICD-10-CM

## 2013-07-01 DIAGNOSIS — R269 Unspecified abnormalities of gait and mobility: Secondary | ICD-10-CM | POA: Diagnosis present

## 2013-07-01 DIAGNOSIS — J438 Other emphysema: Secondary | ICD-10-CM | POA: Diagnosis present

## 2013-07-01 DIAGNOSIS — Z7982 Long term (current) use of aspirin: Secondary | ICD-10-CM

## 2013-07-01 DIAGNOSIS — M545 Low back pain, unspecified: Secondary | ICD-10-CM | POA: Diagnosis present

## 2013-07-01 DIAGNOSIS — S52279A Monteggia's fracture of unspecified ulna, initial encounter for closed fracture: Secondary | ICD-10-CM | POA: Diagnosis not present

## 2013-07-01 DIAGNOSIS — S52209A Unspecified fracture of shaft of unspecified ulna, initial encounter for closed fracture: Secondary | ICD-10-CM

## 2013-07-01 DIAGNOSIS — R5381 Other malaise: Secondary | ICD-10-CM | POA: Diagnosis present

## 2013-07-01 DIAGNOSIS — K759 Inflammatory liver disease, unspecified: Secondary | ICD-10-CM | POA: Diagnosis present

## 2013-07-01 DIAGNOSIS — S5420XA Injury of radial nerve at forearm level, unspecified arm, initial encounter: Secondary | ICD-10-CM

## 2013-07-01 DIAGNOSIS — I252 Old myocardial infarction: Secondary | ICD-10-CM

## 2013-07-01 DIAGNOSIS — E785 Hyperlipidemia, unspecified: Secondary | ICD-10-CM | POA: Diagnosis present

## 2013-07-01 DIAGNOSIS — I10 Essential (primary) hypertension: Secondary | ICD-10-CM | POA: Diagnosis present

## 2013-07-01 DIAGNOSIS — F1010 Alcohol abuse, uncomplicated: Secondary | ICD-10-CM | POA: Diagnosis present

## 2013-07-01 DIAGNOSIS — G8929 Other chronic pain: Secondary | ICD-10-CM | POA: Diagnosis present

## 2013-07-01 DIAGNOSIS — F17200 Nicotine dependence, unspecified, uncomplicated: Secondary | ICD-10-CM | POA: Diagnosis present

## 2013-07-01 LAB — POCT I-STAT, CHEM 8
BUN: <3 mg/dL — ABNORMAL LOW (ref 6–23)
Calcium, Ion: 1.21 mmol/L (ref 1.12–1.23)
Chloride: 106 mEq/L (ref 96–112)
Creatinine, Ser: 0.8 mg/dL (ref 0.50–1.35)
Glucose, Bld: 94 mg/dL (ref 70–99)
HCT: 40 % (ref 39.0–52.0)
Hemoglobin: 13.6 g/dL (ref 13.0–17.0)
Potassium: 3.8 mEq/L (ref 3.7–5.3)
Sodium: 144 mEq/L (ref 137–147)
TCO2: 25 mmol/L (ref 0–100)

## 2013-07-01 LAB — CBC WITH DIFFERENTIAL/PLATELET
Basophils Absolute: 0 10*3/uL (ref 0.0–0.1)
Basophils Relative: 1 % (ref 0–1)
Eosinophils Absolute: 0.3 10*3/uL (ref 0.0–0.7)
Eosinophils Relative: 6 % — ABNORMAL HIGH (ref 0–5)
HCT: 39.2 % (ref 39.0–52.0)
Hemoglobin: 13.7 g/dL (ref 13.0–17.0)
Lymphocytes Relative: 36 % (ref 12–46)
Lymphs Abs: 1.8 10*3/uL (ref 0.7–4.0)
MCH: 33.7 pg (ref 26.0–34.0)
MCHC: 34.9 g/dL (ref 30.0–36.0)
MCV: 96.3 fL (ref 78.0–100.0)
Monocytes Absolute: 0.6 10*3/uL (ref 0.1–1.0)
Monocytes Relative: 11 % (ref 3–12)
Neutro Abs: 2.4 10*3/uL (ref 1.7–7.7)
Neutrophils Relative %: 47 % (ref 43–77)
Platelets: 73 10*3/uL — ABNORMAL LOW (ref 150–400)
RBC: 4.07 MIL/uL — ABNORMAL LOW (ref 4.22–5.81)
RDW: 14.7 % (ref 11.5–15.5)
WBC: 5.2 10*3/uL (ref 4.0–10.5)

## 2013-07-01 LAB — COMPREHENSIVE METABOLIC PANEL
ALT: 99 U/L — ABNORMAL HIGH (ref 0–53)
AST: 117 U/L — ABNORMAL HIGH (ref 0–37)
Albumin: 3.3 g/dL — ABNORMAL LOW (ref 3.5–5.2)
Alkaline Phosphatase: 98 U/L (ref 39–117)
BUN: 5 mg/dL — ABNORMAL LOW (ref 6–23)
CO2: 22 mEq/L (ref 19–32)
Calcium: 8.2 mg/dL — ABNORMAL LOW (ref 8.4–10.5)
Chloride: 107 mEq/L (ref 96–112)
Creatinine, Ser: 0.64 mg/dL (ref 0.50–1.35)
GFR calc Af Amer: >90 mL/min (ref >90)
GFR calc non Af Amer: >90 mL/min (ref >90)
Glucose, Bld: 94 mg/dL (ref 70–99)
Potassium: 3.9 mEq/L (ref 3.7–5.3)
Sodium: 142 mEq/L (ref 137–147)
Total Bilirubin: 1.1 mg/dL (ref 0.3–1.2)
Total Protein: 6.4 g/dL (ref 6.0–8.3)

## 2013-07-01 LAB — PROTIME-INR
INR: 1.26 (ref 0.00–1.49)
Prothrombin Time: 15.5 seconds — ABNORMAL HIGH (ref 11.6–15.2)

## 2013-07-01 LAB — ETHANOL: Alcohol, Ethyl (B): 57 mg/dL — ABNORMAL HIGH (ref 0–11)

## 2013-07-01 MED ORDER — IOHEXOL 300 MG/ML  SOLN
100.0000 mL | Freq: Once | INTRAMUSCULAR | Status: AC | PRN
  Administered 2013-07-01: 100 mL via INTRAVENOUS

## 2013-07-01 MED ORDER — SODIUM CHLORIDE 0.9 % IV BOLUS (SEPSIS)
125.0000 mL | Freq: Once | INTRAVENOUS | Status: AC
  Administered 2013-07-01: 125 mL via INTRAVENOUS

## 2013-07-01 MED ORDER — FENTANYL CITRATE 0.05 MG/ML IJ SOLN
100.0000 ug | Freq: Once | INTRAMUSCULAR | Status: AC
  Administered 2013-07-01: 100 ug via INTRAVENOUS
  Filled 2013-07-01: qty 2

## 2013-07-01 NOTE — ED Notes (Signed)
Pts family in room. All pt belongings given to family.

## 2013-07-01 NOTE — ED Notes (Signed)
Pt transported to CT and X-Ray

## 2013-07-01 NOTE — ED Notes (Signed)
Pt states cannot urinate at this time
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
## 2013-07-01 NOTE — ED Notes (Signed)
Pt requesting pain med.  Rates pain in left arm #10 on pain scale 0/10

## 2013-07-01 NOTE — ED Notes (Signed)
Pt returned from x-rays.  Family at bedside.
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
## 2013-07-01 NOTE — ED Notes (Signed)
Pt not in room.

## 2013-07-02 ENCOUNTER — Emergency Department (HOSPITAL_COMMUNITY): Payer: PRIVATE HEALTH INSURANCE

## 2013-07-02 ENCOUNTER — Encounter (HOSPITAL_COMMUNITY)
Admission: EM | Disposition: A | Discharge status: Home Health | Payer: Self-pay | Source: Home / Self Care | Attending: Internal Medicine

## 2013-07-02 ENCOUNTER — Inpatient Hospital Stay (HOSPITAL_COMMUNITY): Payer: PRIVATE HEALTH INSURANCE | Admitting: Anesthesiology

## 2013-07-02 ENCOUNTER — Encounter (HOSPITAL_COMMUNITY): Payer: PRIVATE HEALTH INSURANCE | Admitting: Anesthesiology

## 2013-07-02 ENCOUNTER — Inpatient Hospital Stay (HOSPITAL_COMMUNITY): Payer: PRIVATE HEALTH INSURANCE

## 2013-07-02 ENCOUNTER — Encounter (HOSPITAL_COMMUNITY): Payer: Self-pay | Admitting: Anesthesiology

## 2013-07-02 DIAGNOSIS — E785 Hyperlipidemia, unspecified: Secondary | ICD-10-CM | POA: Diagnosis present

## 2013-07-02 DIAGNOSIS — R269 Unspecified abnormalities of gait and mobility: Secondary | ICD-10-CM | POA: Diagnosis present

## 2013-07-02 DIAGNOSIS — IMO0002 Reserved for concepts with insufficient information to code with codable children: Secondary | ICD-10-CM | POA: Diagnosis not present

## 2013-07-02 DIAGNOSIS — S52209A Unspecified fracture of shaft of unspecified ulna, initial encounter for closed fracture: Secondary | ICD-10-CM | POA: Diagnosis present

## 2013-07-02 DIAGNOSIS — J438 Other emphysema: Secondary | ICD-10-CM | POA: Diagnosis present

## 2013-07-02 DIAGNOSIS — S52279A Monteggia's fracture of unspecified ulna, initial encounter for closed fracture: Secondary | ICD-10-CM | POA: Diagnosis present

## 2013-07-02 DIAGNOSIS — G8929 Other chronic pain: Secondary | ICD-10-CM | POA: Diagnosis present

## 2013-07-02 DIAGNOSIS — I10 Essential (primary) hypertension: Secondary | ICD-10-CM | POA: Diagnosis present

## 2013-07-02 DIAGNOSIS — F1010 Alcohol abuse, uncomplicated: Secondary | ICD-10-CM | POA: Diagnosis present

## 2013-07-02 DIAGNOSIS — I252 Old myocardial infarction: Secondary | ICD-10-CM | POA: Diagnosis not present

## 2013-07-02 DIAGNOSIS — M545 Low back pain, unspecified: Secondary | ICD-10-CM | POA: Diagnosis present

## 2013-07-02 DIAGNOSIS — R5381 Other malaise: Secondary | ICD-10-CM | POA: Diagnosis present

## 2013-07-02 DIAGNOSIS — K759 Inflammatory liver disease, unspecified: Secondary | ICD-10-CM | POA: Diagnosis present

## 2013-07-02 DIAGNOSIS — F17200 Nicotine dependence, unspecified, uncomplicated: Secondary | ICD-10-CM | POA: Diagnosis present

## 2013-07-02 DIAGNOSIS — Z7982 Long term (current) use of aspirin: Secondary | ICD-10-CM | POA: Diagnosis not present

## 2013-07-02 HISTORY — PX: ORIF ELBOW FRACTURE: SHX5031

## 2013-07-02 LAB — BASIC METABOLIC PANEL
BUN: 5 mg/dL — ABNORMAL LOW (ref 6–23)
CO2: 23 mEq/L (ref 19–32)
Calcium: 8.5 mg/dL (ref 8.4–10.5)
Chloride: 107 mEq/L (ref 96–112)
Creatinine, Ser: 0.67 mg/dL (ref 0.50–1.35)
GFR calc Af Amer: >90 mL/min (ref >90)
GFR calc non Af Amer: >90 mL/min (ref >90)
Glucose, Bld: 120 mg/dL — ABNORMAL HIGH (ref 70–99)
Potassium: 4.3 mEq/L (ref 3.7–5.3)
Sodium: 141 mEq/L (ref 137–147)

## 2013-07-02 LAB — CBC
HCT: 37.8 % — ABNORMAL LOW (ref 39.0–52.0)
Hemoglobin: 12.8 g/dL — ABNORMAL LOW (ref 13.0–17.0)
MCH: 33 pg (ref 26.0–34.0)
MCHC: 33.9 g/dL (ref 30.0–36.0)
MCV: 97.4 fL (ref 78.0–100.0)
Platelets: 69 10*3/uL — ABNORMAL LOW (ref 150–400)
RBC: 3.88 MIL/uL — ABNORMAL LOW (ref 4.22–5.81)
RDW: 15 % (ref 11.5–15.5)
WBC: 7.5 10*3/uL (ref 4.0–10.5)

## 2013-07-02 LAB — URINALYSIS, ROUTINE W REFLEX MICROSCOPIC
Bilirubin Urine: NEGATIVE
Glucose, UA: NEGATIVE mg/dL
Hgb urine dipstick: NEGATIVE
Ketones, ur: NEGATIVE mg/dL
Leukocytes, UA: NEGATIVE
Nitrite: NEGATIVE
Protein, ur: NEGATIVE mg/dL
Specific Gravity, Urine: 1.042 — ABNORMAL HIGH (ref 1.005–1.030)
Urobilinogen, UA: 1 mg/dL (ref 0.0–1.0)
pH: 5 (ref 5.0–8.0)

## 2013-07-02 LAB — SAMPLE TO BLOOD BANK

## 2013-07-02 LAB — RAPID URINE DRUG SCREEN, HOSP PERFORMED
Amphetamines: NOT DETECTED
Barbiturates: NOT DETECTED
Benzodiazepines: POSITIVE — AB
Cocaine: NOT DETECTED
Opiates: NOT DETECTED
Tetrahydrocannabinol: POSITIVE — AB

## 2013-07-02 LAB — ETHANOL: Alcohol, Ethyl (B): 60 mg/dL — ABNORMAL HIGH (ref 0–11)

## 2013-07-02 SURGERY — OPEN REDUCTION INTERNAL FIXATION (ORIF) ELBOW/OLECRANON FRACTURE
Anesthesia: General | Site: Arm Lower | Laterality: Left

## 2013-07-02 MED ORDER — FUROSEMIDE 20 MG PO TABS
20.0000 mg | ORAL_TABLET | Freq: Two times a day (BID) | ORAL | Status: DC
  Administered 2013-07-02 – 2013-07-04 (×5): 20 mg via ORAL
  Filled 2013-07-02 (×8): qty 1

## 2013-07-02 MED ORDER — BUSPIRONE HCL 15 MG PO TABS
15.0000 mg | ORAL_TABLET | Freq: Three times a day (TID) | ORAL | Status: DC
  Administered 2013-07-02 – 2013-07-04 (×7): 15 mg via ORAL
  Filled 2013-07-02 (×9): qty 1

## 2013-07-02 MED ORDER — BENZONATATE 100 MG PO CAPS
100.0000 mg | ORAL_CAPSULE | Freq: Three times a day (TID) | ORAL | Status: DC
  Administered 2013-07-02 – 2013-07-04 (×7): 100 mg via ORAL
  Filled 2013-07-02 (×9): qty 1

## 2013-07-02 MED ORDER — CARVEDILOL 6.25 MG PO TABS
6.2500 mg | ORAL_TABLET | Freq: Two times a day (BID) | ORAL | Status: DC
  Administered 2013-07-02 – 2013-07-04 (×3): 6.25 mg via ORAL
  Filled 2013-07-02 (×7): qty 1

## 2013-07-02 MED ORDER — GLYCOPYRROLATE 0.2 MG/ML IJ SOLN
INTRAMUSCULAR | Status: DC | PRN
  Administered 2013-07-02: 0.6 mg via INTRAVENOUS
  Administered 2013-07-02: 0.4 mg via INTRAVENOUS

## 2013-07-02 MED ORDER — MIRTAZAPINE 15 MG PO TABS
15.0000 mg | ORAL_TABLET | Freq: Every day | ORAL | Status: DC
  Administered 2013-07-02 – 2013-07-03 (×2): 15 mg via ORAL
  Filled 2013-07-02 (×3): qty 1

## 2013-07-02 MED ORDER — IPRATROPIUM-ALBUTEROL 0.5-2.5 (3) MG/3ML IN SOLN: 3.0000 mL | Freq: Once | RESPIRATORY_TRACT | Status: DC

## 2013-07-02 MED ORDER — ATORVASTATIN CALCIUM 20 MG PO TABS
20.0000 mg | ORAL_TABLET | Freq: Every day | ORAL | Status: DC
  Administered 2013-07-02 – 2013-07-03 (×2): 20 mg via ORAL
  Filled 2013-07-02 (×3): qty 1

## 2013-07-02 MED ORDER — ROCURONIUM BROMIDE 100 MG/10ML IV SOLN
INTRAVENOUS | Status: DC | PRN
  Administered 2013-07-02: 50 mg via INTRAVENOUS

## 2013-07-02 MED ORDER — ZOLPIDEM TARTRATE 5 MG PO TABS: 5.0000 mg | ORAL_TABLET | Freq: Every evening | ORAL | Status: DC | PRN

## 2013-07-02 MED ORDER — PROPOFOL 10 MG/ML IV BOLUS
INTRAVENOUS | Status: DC | PRN
  Administered 2013-07-02: 130 mg via INTRAVENOUS

## 2013-07-02 MED ORDER — SODIUM CHLORIDE 0.9 % IJ SOLN: 3.0000 mL | INTRAMUSCULAR | Status: DC | PRN

## 2013-07-02 MED ORDER — BUPIVACAINE HCL (PF) 0.25 % IJ SOLN: INTRAMUSCULAR | Status: DC | PRN

## 2013-07-02 MED ORDER — ONDANSETRON HCL 4 MG/2ML IJ SOLN: 4.0000 mg | Freq: Once | INTRAMUSCULAR | Status: DC | PRN

## 2013-07-02 MED ORDER — CEFAZOLIN SODIUM-DEXTROSE 2-3 GM-% IV SOLR
INTRAVENOUS | Status: DC | PRN
  Administered 2013-07-02: 2 g via INTRAVENOUS

## 2013-07-02 MED ORDER — PNEUMOCOCCAL VAC POLYVALENT 25 MCG/0.5ML IJ INJ
0.5000 mL | INJECTION | INTRAMUSCULAR | Status: AC
  Administered 2013-07-03: 0.5 mL via INTRAMUSCULAR
  Filled 2013-07-02: qty 0.5

## 2013-07-02 MED ORDER — ALPRAZOLAM 0.5 MG PO TABS: 1.0000 mg | ORAL_TABLET | ORAL | Status: DC

## 2013-07-02 MED ORDER — FENTANYL CITRATE 0.05 MG/ML IJ SOLN
INTRAMUSCULAR | Status: DC | PRN
  Administered 2013-07-02: 150 ug via INTRAVENOUS

## 2013-07-02 MED ORDER — SENNOSIDES-DOCUSATE SODIUM 8.6-50 MG PO TABS
1.0000 | ORAL_TABLET | Freq: Two times a day (BID) | ORAL | Status: DC
  Administered 2013-07-02 – 2013-07-04 (×5): 1 via ORAL
  Filled 2013-07-02 (×5): qty 1

## 2013-07-02 MED ORDER — ALBUTEROL SULFATE (2.5 MG/3ML) 0.083% IN NEBU
2.5000 mg | INHALATION_SOLUTION | Freq: Four times a day (QID) | RESPIRATORY_TRACT | Status: DC | PRN
  Administered 2013-07-02: 2.5 mg via RESPIRATORY_TRACT

## 2013-07-02 MED ORDER — ENOXAPARIN SODIUM 40 MG/0.4ML ~~LOC~~ SOLN: 40.0000 mg | SUBCUTANEOUS | Status: DC

## 2013-07-02 MED ORDER — LIDOCAINE HCL (CARDIAC) 20 MG/ML IV SOLN
INTRAVENOUS | Status: DC | PRN
  Administered 2013-07-02: 100 mg via INTRAVENOUS

## 2013-07-02 MED ORDER — CEFAZOLIN SODIUM-DEXTROSE 2-3 GM-% IV SOLR
INTRAVENOUS | Status: AC
  Filled 2013-07-02: qty 50

## 2013-07-02 MED ORDER — SUCCINYLCHOLINE CHLORIDE 20 MG/ML IJ SOLN
INTRAMUSCULAR | Status: DC | PRN
  Administered 2013-07-02: 100 mg via INTRAVENOUS

## 2013-07-02 MED ORDER — PHENYLEPHRINE HCL 10 MG/ML IJ SOLN
INTRAMUSCULAR | Status: DC | PRN
  Administered 2013-07-02 (×3): 160 ug via INTRAVENOUS

## 2013-07-02 MED ORDER — TRAZODONE HCL 50 MG PO TABS
50.0000 mg | ORAL_TABLET | Freq: Every day | ORAL | Status: DC
  Administered 2013-07-02: 100 mg via ORAL
  Administered 2013-07-03: 50 mg via ORAL
  Filled 2013-07-02 (×3): qty 2

## 2013-07-02 MED ORDER — HYDROMORPHONE HCL PF 1 MG/ML IJ SOLN
0.5000 mg | INTRAMUSCULAR | Status: DC | PRN
  Administered 2013-07-02 (×3): 0.5 mg via INTRAVENOUS
  Filled 2013-07-02 (×3): qty 1

## 2013-07-02 MED ORDER — CEFAZOLIN SODIUM 1-5 GM-% IV SOLN
1.0000 g | Freq: Three times a day (TID) | INTRAVENOUS | Status: DC
  Administered 2013-07-02 – 2013-07-04 (×8): 1 g via INTRAVENOUS
  Filled 2013-07-02 (×12): qty 50

## 2013-07-02 MED ORDER — SODIUM CHLORIDE 0.9 % IV SOLN
10.0000 mg | INTRAVENOUS | Status: DC | PRN
  Administered 2013-07-02: 50 ug/min via INTRAVENOUS

## 2013-07-02 MED ORDER — ALBUMIN HUMAN 5 % IV SOLN
INTRAVENOUS | Status: DC | PRN
  Administered 2013-07-02 (×2): via INTRAVENOUS

## 2013-07-02 MED ORDER — HYDROCODONE-ACETAMINOPHEN 5-325 MG PO TABS
1.0000 | ORAL_TABLET | ORAL | Status: DC | PRN
  Administered 2013-07-02 – 2013-07-03 (×4): 2 via ORAL
  Filled 2013-07-02 (×4): qty 2

## 2013-07-02 MED ORDER — SODIUM CHLORIDE 0.9 % IJ SOLN
3.0000 mL | Freq: Two times a day (BID) | INTRAMUSCULAR | Status: DC
  Administered 2013-07-03: 3 mL via INTRAVENOUS

## 2013-07-02 MED ORDER — ALPRAZOLAM 0.5 MG PO TABS
1.0000 mg | ORAL_TABLET | Freq: Four times a day (QID) | ORAL | Status: DC | PRN
  Administered 2013-07-02 – 2013-07-03 (×4): 1 mg via ORAL
  Filled 2013-07-02 (×4): qty 2

## 2013-07-02 MED ORDER — SODIUM CHLORIDE 0.9 % IJ SOLN
3.0000 mL | Freq: Two times a day (BID) | INTRAMUSCULAR | Status: DC
Start: 2013-07-02 — End: 2013-07-04
  Administered 2013-07-03 (×2): 3 mL via INTRAVENOUS

## 2013-07-02 MED ORDER — ONDANSETRON HCL 4 MG/2ML IJ SOLN
INTRAMUSCULAR | Status: DC | PRN
  Administered 2013-07-02: 4 mg via INTRAVENOUS

## 2013-07-02 MED ORDER — HYDROMORPHONE HCL PF 1 MG/ML IJ SOLN: 0.2500 mg | INTRAMUSCULAR | Status: DC | PRN

## 2013-07-02 MED ORDER — VITAMIN C 500 MG PO TABS
1000.0000 mg | ORAL_TABLET | Freq: Every day | ORAL | Status: DC
  Administered 2013-07-02 – 2013-07-04 (×3): 1000 mg via ORAL
  Filled 2013-07-02 (×3): qty 2

## 2013-07-02 MED ORDER — ASPIRIN EC 81 MG PO TBEC
81.0000 mg | DELAYED_RELEASE_TABLET | Freq: Every morning | ORAL | Status: DC
  Administered 2013-07-02 – 2013-07-04 (×3): 81 mg via ORAL
  Filled 2013-07-02 (×3): qty 1

## 2013-07-02 MED ORDER — BUPIVACAINE HCL (PF) 0.25 % IJ SOLN
INTRAMUSCULAR | Status: AC
  Filled 2013-07-02: qty 30

## 2013-07-02 MED ORDER — POTASSIUM CHLORIDE ER 8 MEQ PO TBCR
8.0000 meq | EXTENDED_RELEASE_TABLET | Freq: Two times a day (BID) | ORAL | Status: DC
  Administered 2013-07-02 – 2013-07-04 (×5): 8 meq via ORAL
  Filled 2013-07-02 (×7): qty 1

## 2013-07-02 MED ORDER — SODIUM CHLORIDE 0.9 % IV SOLN
250.0000 mL | INTRAVENOUS | Status: DC | PRN
Start: 2013-07-02 — End: 2013-07-04
  Administered 2013-07-02: 250 mL via INTRAVENOUS
  Administered 2013-07-02: 10 mL via INTRAVENOUS

## 2013-07-02 MED ORDER — OXYCODONE-ACETAMINOPHEN 5-325 MG PO TABS
1.0000 | ORAL_TABLET | ORAL | Status: DC | PRN
  Administered 2013-07-02 – 2013-07-04 (×9): 1 via ORAL
  Filled 2013-07-02 (×9): qty 1

## 2013-07-02 MED ORDER — ALBUTEROL SULFATE (2.5 MG/3ML) 0.083% IN NEBU
INHALATION_SOLUTION | RESPIRATORY_TRACT | Status: AC
  Filled 2013-07-02: qty 3

## 2013-07-02 MED ORDER — ALBUTEROL SULFATE (2.5 MG/3ML) 0.083% IN NEBU
2.5000 mg | INHALATION_SOLUTION | Freq: Once | RESPIRATORY_TRACT | Status: AC
  Administered 2013-07-02: 2.5 mg via RESPIRATORY_TRACT

## 2013-07-02 MED ORDER — ENOXAPARIN SODIUM 40 MG/0.4ML ~~LOC~~ SOLN
40.0000 mg | SUBCUTANEOUS | Status: DC
  Administered 2013-07-02 – 2013-07-03 (×2): 40 mg via SUBCUTANEOUS
  Filled 2013-07-02 (×3): qty 0.4

## 2013-07-02 MED ORDER — NEOSTIGMINE METHYLSULFATE 1 MG/ML IJ SOLN
INTRAMUSCULAR | Status: DC | PRN
  Administered 2013-07-02: 4 mg via INTRAVENOUS

## 2013-07-02 MED ORDER — ESCITALOPRAM OXALATE 20 MG PO TABS
20.0000 mg | ORAL_TABLET | Freq: Every morning | ORAL | Status: DC
  Administered 2013-07-02 – 2013-07-04 (×3): 20 mg via ORAL
  Filled 2013-07-02 (×3): qty 1

## 2013-07-02 MED ORDER — LACTATED RINGERS IV SOLN
INTRAVENOUS | Status: DC | PRN
  Administered 2013-07-02 (×4): via INTRAVENOUS

## 2013-07-02 MED ORDER — 0.9 % SODIUM CHLORIDE (POUR BTL) OPTIME
TOPICAL | Status: DC | PRN
  Administered 2013-07-02 (×2): 1000 mL

## 2013-07-02 MED ORDER — LISINOPRIL 2.5 MG PO TABS
2.5000 mg | ORAL_TABLET | Freq: Every day | ORAL | Status: DC
  Administered 2013-07-02 – 2013-07-04 (×2): 2.5 mg via ORAL
  Filled 2013-07-02 (×3): qty 1

## 2013-07-02 SURGICAL SUPPLY — 67 items
BANDAGE ELASTIC 3 VELCRO ST LF (GAUZE/BANDAGES/DRESSINGS) ×1 IMPLANT
BANDAGE ELASTIC 4 VELCRO ST LF (GAUZE/BANDAGES/DRESSINGS) ×1 IMPLANT
BANDAGE GAUZE 4  KLING STR (GAUZE/BANDAGES/DRESSINGS) ×2 IMPLANT
BANDAGE GAUZE ELAST BULKY 4 IN (GAUZE/BANDAGES/DRESSINGS) ×7 IMPLANT
BNDG CMPR 9X4 STRL LF SNTH (GAUZE/BANDAGES/DRESSINGS) ×1
BNDG COHESIVE 4X5 TAN STRL (GAUZE/BANDAGES/DRESSINGS) ×1 IMPLANT
BNDG ESMARK 4X9 LF (GAUZE/BANDAGES/DRESSINGS) ×2 IMPLANT
CANISTER SUCTION 2500CC (MISCELLANEOUS) ×1 IMPLANT
CLOTH BEACON ORANGE TIMEOUT ST (SAFETY) ×2 IMPLANT
CORDS BIPOLAR (ELECTRODE) ×2 IMPLANT
COVER MAYO STAND STRL (DRAPES) ×2 IMPLANT
COVER SURGICAL LIGHT HANDLE (MISCELLANEOUS) ×2 IMPLANT
CUFF TOURNIQUET SINGLE 18IN (TOURNIQUET CUFF) ×1 IMPLANT
CUFF TOURNIQUET SINGLE 24IN (TOURNIQUET CUFF) ×1 IMPLANT
DRAPE INCISE IOBAN 66X45 STRL (DRAPES) ×1 IMPLANT
DRAPE OEC MINIVIEW 54X84 (DRAPES) ×1 IMPLANT
DRSG ADAPTIC 3X8 NADH LF (GAUZE/BANDAGES/DRESSINGS) ×1 IMPLANT
GAUZE XEROFORM 1X8 LF (GAUZE/BANDAGES/DRESSINGS) ×1 IMPLANT
GAUZE XEROFORM 5X9 LF (GAUZE/BANDAGES/DRESSINGS) ×1 IMPLANT
GLOVE BIOGEL M STRL SZ7.5 (GLOVE) ×1 IMPLANT
GLOVE BIOGEL PI IND STRL 6.5 (GLOVE) IMPLANT
GLOVE BIOGEL PI INDICATOR 6.5 (GLOVE) ×2
GLOVE ECLIPSE 6.5 STRL STRAW (GLOVE) ×1 IMPLANT
GLOVE SS BIOGEL STRL SZ 8 (GLOVE) ×1 IMPLANT
GLOVE SUPERSENSE BIOGEL SZ 8 (GLOVE) ×2
GOWN SRG XL XLNG 56XLVL 4 (GOWN DISPOSABLE) ×3 IMPLANT
GOWN STRL NON-REIN LRG LVL3 (GOWN DISPOSABLE) ×4 IMPLANT
GOWN STRL NON-REIN XL XLG LVL4 (GOWN DISPOSABLE) ×6
KIT BASIN OR (CUSTOM PROCEDURE TRAY) ×2 IMPLANT
KIT ROOM TURNOVER OR (KITS) ×2 IMPLANT
LOOP VESSEL MAXI BLUE (MISCELLANEOUS) IMPLANT
MANIFOLD NEPTUNE II (INSTRUMENTS) ×2 IMPLANT
NDL HYPO 25GX1X1/2 BEV (NEEDLE) IMPLANT
NEEDLE HYPO 25GX1X1/2 BEV (NEEDLE) ×2 IMPLANT
NS IRRIG 1000ML POUR BTL (IV SOLUTION) ×4 IMPLANT
PACK ORTHO EXTREMITY (CUSTOM PROCEDURE TRAY) ×2 IMPLANT
PAD ARMBOARD 7.5X6 YLW CONV (MISCELLANEOUS) ×4 IMPLANT
PAD CAST 4YDX4 CTTN HI CHSV (CAST SUPPLIES) ×2 IMPLANT
PADDING CAST COTTON 4X4 STRL (CAST SUPPLIES) ×8
PLATE LOCK COMP 10H 3.5 FOOT (Plate) ×2 IMPLANT
SCREW CORTICAL 3.5MM  20MM (Screw) ×1 IMPLANT
SCREW CORTICAL 3.5MM 20MM (Screw) IMPLANT
SCREW CORTICAL 3.5MM 22MM (Screw) ×1 IMPLANT
SCREW CORTICAL 3.5MM 24MM (Screw) ×1 IMPLANT
SCREW LOCK CORT STAR 3.5X14 (Screw) ×2 IMPLANT
SCREW LOCK CORT STAR 3.5X16 (Screw) ×1 IMPLANT
SCREW LOCK CORT STAR 3.5X18 (Screw) ×1 IMPLANT
SCREW LOCK CORT STAR 3.5X20 (Screw) ×1 IMPLANT
SCREW LOCK CORT STAR 3.5X22 (Screw) ×1 IMPLANT
SOLUTION BETADINE 4OZ (MISCELLANEOUS) ×2 IMPLANT
SPECIMEN JAR SMALL (MISCELLANEOUS) ×1 IMPLANT
SPLINT FIBERGLASS 4X30 (CAST SUPPLIES) ×3 IMPLANT
SPONGE GAUZE 4X4 12PLY (GAUZE/BANDAGES/DRESSINGS) ×3 IMPLANT
SPONGE SCRUB IODOPHOR (GAUZE/BANDAGES/DRESSINGS) ×2 IMPLANT
SUCTION FRAZIER TIP 10 FR DISP (SUCTIONS) ×1 IMPLANT
SUT PROLENE 3 0 PS 2 (SUTURE) ×4 IMPLANT
SUT VIC AB 0 CT1 27 (SUTURE) ×2
SUT VIC AB 0 CT1 27XBRD ANBCTR (SUTURE) IMPLANT
SUT VIC AB 2-0 CT1 27 (SUTURE) ×2
SUT VIC AB 2-0 CT1 TAPERPNT 27 (SUTURE) IMPLANT
SUT VIC AB 3-0 FS2 27 (SUTURE) ×2 IMPLANT
SYR CONTROL 10ML LL (SYRINGE) ×1 IMPLANT
TOWEL OR 17X24 6PK STRL BLUE (TOWEL DISPOSABLE) ×2 IMPLANT
TOWEL OR 17X26 10 PK STRL BLUE (TOWEL DISPOSABLE) ×4 IMPLANT
TUBE CONNECTING 12X1/4 (SUCTIONS) ×1 IMPLANT
UNDERPAD 30X30 INCONTINENT (UNDERPADS AND DIAPERS) ×2 IMPLANT
WATER STERILE IRR 1000ML POUR (IV SOLUTION) ×1 IMPLANT

## 2013-07-02 NOTE — Preoperative (Signed)
Beta Blockers   Reason not to administer Beta Blockers:Not Applicable 

## 2013-07-02 NOTE — Anesthesia Procedure Notes (Signed)
Procedure Name: Intubation Date/Time: 07/02/2013 1:56 AM Performed by: Valetta Fuller Pre-anesthesia Checklist: Patient identified, Emergency Drugs available, Suction available and Patient being monitored Patient Re-evaluated:Patient Re-evaluated prior to inductionOxygen Delivery Method: Circle system utilized Preoxygenation: Pre-oxygenation with 100% oxygen Intubation Type: IV induction, Rapid sequence and Cricoid Pressure applied Laryngoscope Size: Miller and 2 Grade View: Grade I Tube type: Oral Tube size: 7.5 mm Number of attempts: 1 Airway Equipment and Method: Stylet Placement Confirmation: ETT inserted through vocal cords under direct vision and positive ETCO2 Secured at: 23 cm Tube secured with: Tape Dental Injury: Teeth and Oropharynx as per pre-operative assessment
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
## 2013-07-02 NOTE — Op Note (Signed)
See Dictation #[id number] Hardie Pulley MD

## 2013-07-02 NOTE — ED Notes (Signed)
Pt transported to CT ?

## 2013-07-02 NOTE — Progress Notes (Signed)
  Echocardiogram 2D Echocardiogram has been performed.  [name] 07/02/2013, 10:12 AM
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
## 2013-07-02 NOTE — Transfer of Care (Signed)
Immediate Anesthesia Transfer of Care Note  Patient: [name]  Procedure(s) Performed: Procedure(s): Open Reduction Internal fixationof ulnar shaft with Type 1 Monteigga fracture with surgical reconstruction (Left)  Patient Location: PACU  Anesthesia Type:General  Level of Consciousness: sedated  Airway & Oxygen Therapy: Patient connected to face mask oxygen  Post-op Assessment: Report given to PACU RN and Post -op Vital signs reviewed and stable  Post vital signs: Reviewed and stable  Complications: No apparent anesthesia complications
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
## 2013-07-03 DIAGNOSIS — S5420XA Injury of radial nerve at forearm level, unspecified arm, initial encounter: Secondary | ICD-10-CM

## 2013-07-03 NOTE — Anesthesia Postprocedure Evaluation (Signed)
  Anesthesia Post-op Note  Patient: [name]  Procedure(s) Performed: Procedure(s): Open Reduction Internal fixationof ulnar shaft with Type 1 Monteigga fracture with surgical reconstruction (Left)  Patient Location: PACU  Anesthesia Type:General  Level of Consciousness: awake, alert , oriented and patient cooperative  Airway and Oxygen Therapy: Patient Spontanous Breathing  Post-op Pain: mild  Post-op Assessment: Post-op Vital signs reviewed  Post-op Vital Signs: stable  Complications: No apparent anesthesia complications
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
## 2013-07-03 NOTE — Progress Notes (Signed)
Dr. Jerilee Hoh aware of pt's BP 94/47.  Instructed to hold Coreg and Lisinopril.  Did not want to order any IV fluids at this time d/t pt drinking and eating well.  Orders received and carried out.

## 2013-07-03 NOTE — Progress Notes (Signed)
Orthopedic Tech Progress Note Patient Details:  [name] [birth year] [id number]  Ortho Devices Type of Ortho Device: Arm sling Ortho Device/Splint Location: L UE Ortho Device/Splint Interventions: Application   Josephine Rudnick T 07/03/2013, 12:49 PM
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
## 2013-07-04 NOTE — Progress Notes (Signed)
Subjective: 2 Days Post-Op Procedure(s) (LRB): Open Reduction Internal fixationof ulnar shaft with Type 1 Monteigga fracture with surgical reconstruction (Left) Patient reports pain as is improved overall. He denies chest pain, shortness breath, cultures without bladder function. Is tolerating a regular diet without difficulties and voiding without difficulties. If this does have a local pain to the left forearm and elbow region but this is very tolerable and managed with his current pain regime.  Objective: Vital signs in last 24 hours: Temp:  [98.2 F (36.8 C)-98.3 F (36.8 C)] 98.2 F (36.8 C) (01/19 1300) Pulse Rate:  [69-86] 78 (01/19 1300) Resp:  [16-17] 17 (01/19 1300) BP: (107-134)/(56-73) 117/60 mmHg (01/19 1300) SpO2:  [90 %-93 %] 93 % (01/19 1300)  Intake/Output from previous day: 01/18 0701 - 01/19 0700 In: 899 [P.O.:720; I.V.:29; IV Piggyback:150] Out: 600 [Urine:600] Intake/Output this shift: Total I/O In: 640 [P.O.:640] Out: 300 [Urine:300]   Recent Labs  07/01/13 2110 07/01/13 2121 07/02/13 0840  HGB 13.7 13.6 12.8*    Recent Labs  07/01/13 2110 07/01/13 2121 07/02/13 0840  WBC 5.2  --  7.5  RBC 4.07*  --  3.88*  HCT 39.2 40.0 37.8*  PLT 73*  --  69*    Recent Labs  07/01/13 2110 07/01/13 2121 07/02/13 0840  NA 142 144 141  K 3.9 3.8 4.3  CL 107 106 107  CO2 22  --  23  BUN 5* <3* 5*  CREATININE 0.64 0.80 0.67  GLUCOSE 94 94 120*  CALCIUM 8.2*  --  8.5    Recent Labs  07/01/13 2110  INR 1.26    Patient is pleasant sitting in a chair awake and oriented.  Head normocephalic  Chest shows equal expansions present respirations are nonlabored  Evaluation left upper extremity shows his splint is intact is no signs of infection or dystrophy present. Radial nerve palsy present is making better active attempts at range of motion terms of flexion with the digits sensation is grossly intact refill is intact Assessment/Plan: 2 Days Post-Op  Procedure(s) (LRB): Open Reduction Internal fixationof ulnar shaft with Type 1 Monteigga fracture with surgical reconstruction (Left) He may be discharged today he's going to be returning to his home,. Given his general deconditioning history of chronic low back pain with unsteady gait and recent upper extremity injury requiring major surgical intervention he will have home health physical therapy for gait training strengthening and conditioning attempts at active range of motion of the fingers but no weightbearing to the left upper extremity. We have discussed with him keeping his dressings clean and dry do not remove these. He will need to follow up with [place] at our office in 2 weeks. Recommend oxycodone for pain take sparingly at home. All questions were encouraged and answered . I have spent greater than 20 minutes at bedside today.  Nielle Duford L 07/04/2013, 3:22 PM

## 2013-07-04 NOTE — Progress Notes (Signed)
Utilization review completed.  

## 2013-07-04 NOTE — Discharge Instructions (Signed)
Home Health Physical therapy to be provided by [facility] [phone]

## 2013-07-04 NOTE — Care Management Note (Signed)
CARE MANAGEMENT NOTE 07/04/2013  Patient:  GRAVES, NIPP A   Account Number:  [id number]  Date Initiated:  07/04/2013  Documentation initiated by:  Ricki Miller  Subjective/Objective Assessment:   58 yr old male s/p ORIF of left elbow fracture     Action/Plan:   Case manager spoke with patient concerning home health needs at discharge. Choice offered. Pateint uses a cane, doesn't need DME.   Anticipated DC Date:  07/04/2013   Anticipated DC Plan:  [place]  CM consult      [facility] Choice  HOME HEALTH   Choice offered to / List presented to:  C-1 Patient        [place] arranged  [facility]      [facility].   Status of service:  Completed, signed off Medicare Important Message given?   (If response is "NO", the following Medicare IM given date fields will be blank) Date Medicare IM given:   Date Additional Medicare IM given:    Discharge Disposition:  [place]  Per UR Regulation:    If discussed at Long Length of Stay Meetings, dates discussed:    Comments:
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
## 2013-07-04 NOTE — Progress Notes (Signed)
Await ortho clearance for DC as patient has no active medical conditions. OK from my standpoint to DC.  Domingo Mend, MD [facility] Pager: [phone]
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
## 2013-07-05 ENCOUNTER — Encounter (HOSPITAL_COMMUNITY): Payer: Self-pay | Admitting: Orthopedic Surgery

## 2013-08-10 ENCOUNTER — Emergency Department (HOSPITAL_COMMUNITY): Payer: PRIVATE HEALTH INSURANCE

## 2013-08-10 ENCOUNTER — Emergency Department (HOSPITAL_COMMUNITY)
Admission: EM | Admit: 2013-08-10 | Discharge: 2013-08-11 | Disposition: A | Discharge status: Home / Self Care | Payer: PRIVATE HEALTH INSURANCE | Attending: Emergency Medicine | Admitting: Emergency Medicine

## 2013-08-10 ENCOUNTER — Encounter (HOSPITAL_COMMUNITY): Payer: Self-pay | Admitting: Emergency Medicine

## 2013-08-10 DIAGNOSIS — F17200 Nicotine dependence, unspecified, uncomplicated: Secondary | ICD-10-CM | POA: Insufficient documentation

## 2013-08-10 DIAGNOSIS — Z79899 Other long term (current) drug therapy: Secondary | ICD-10-CM | POA: Insufficient documentation

## 2013-08-10 DIAGNOSIS — J438 Other emphysema: Secondary | ICD-10-CM | POA: Insufficient documentation

## 2013-08-10 DIAGNOSIS — Z7982 Long term (current) use of aspirin: Secondary | ICD-10-CM | POA: Insufficient documentation

## 2013-08-10 DIAGNOSIS — S59919A Unspecified injury of unspecified forearm, initial encounter: Secondary | ICD-10-CM

## 2013-08-10 DIAGNOSIS — S0990XA Unspecified injury of head, initial encounter: Secondary | ICD-10-CM | POA: Insufficient documentation

## 2013-08-10 DIAGNOSIS — M25522 Pain in left elbow: Secondary | ICD-10-CM

## 2013-08-10 DIAGNOSIS — Y939 Activity, unspecified: Secondary | ICD-10-CM | POA: Insufficient documentation

## 2013-08-10 DIAGNOSIS — Z9889 Other specified postprocedural states: Secondary | ICD-10-CM | POA: Insufficient documentation

## 2013-08-10 DIAGNOSIS — M129 Arthropathy, unspecified: Secondary | ICD-10-CM | POA: Insufficient documentation

## 2013-08-10 DIAGNOSIS — W010XXA Fall on same level from slipping, tripping and stumbling without subsequent striking against object, initial encounter: Secondary | ICD-10-CM | POA: Insufficient documentation

## 2013-08-10 DIAGNOSIS — Y929 Unspecified place or not applicable: Secondary | ICD-10-CM | POA: Insufficient documentation

## 2013-08-10 DIAGNOSIS — W1809XA Striking against other object with subsequent fall, initial encounter: Secondary | ICD-10-CM | POA: Insufficient documentation

## 2013-08-10 DIAGNOSIS — S6990XA Unspecified injury of unspecified wrist, hand and finger(s), initial encounter: Secondary | ICD-10-CM | POA: Insufficient documentation

## 2013-08-10 DIAGNOSIS — I252 Old myocardial infarction: Secondary | ICD-10-CM | POA: Insufficient documentation

## 2013-08-10 DIAGNOSIS — S59909A Unspecified injury of unspecified elbow, initial encounter: Secondary | ICD-10-CM | POA: Insufficient documentation

## 2013-08-10 DIAGNOSIS — Z8619 Personal history of other infectious and parasitic diseases: Secondary | ICD-10-CM | POA: Insufficient documentation

## 2013-08-10 HISTORY — DX: Unspecified viral hepatitis C without hepatic coma: B19.20

## 2013-08-10 HISTORY — DX: Unspecified viral hepatitis B without hepatic coma: B19.10

## 2013-08-10 LAB — CBC WITH DIFFERENTIAL/PLATELET
Basophils Absolute: 0 10*3/uL (ref 0.0–0.1)
Basophils Relative: 1 % (ref 0–1)
Eosinophils Absolute: 0.2 10*3/uL (ref 0.0–0.7)
Eosinophils Relative: 3 % (ref 0–5)
HCT: 40.6 % (ref 39.0–52.0)
Hemoglobin: 14.1 g/dL (ref 13.0–17.0)
Lymphocytes Relative: 35 % (ref 12–46)
Lymphs Abs: 2.4 10*3/uL (ref 0.7–4.0)
MCH: 32.8 pg (ref 26.0–34.0)
MCHC: 34.7 g/dL (ref 30.0–36.0)
MCV: 94.4 fL (ref 78.0–100.0)
Monocytes Absolute: 0.5 10*3/uL (ref 0.1–1.0)
Monocytes Relative: 7 % (ref 3–12)
Neutro Abs: 3.8 10*3/uL (ref 1.7–7.7)
Neutrophils Relative %: 55 % (ref 43–77)
Platelets: 69 10*3/uL — ABNORMAL LOW (ref 150–400)
RBC: 4.3 MIL/uL (ref 4.22–5.81)
RDW: 14.8 % (ref 11.5–15.5)
WBC: 6.9 10*3/uL (ref 4.0–10.5)

## 2013-08-10 LAB — BASIC METABOLIC PANEL
BUN: 11 mg/dL (ref 6–23)
CO2: 24 mEq/L (ref 19–32)
Calcium: 9.3 mg/dL (ref 8.4–10.5)
Chloride: 104 mEq/L (ref 96–112)
Creatinine, Ser: 0.7 mg/dL (ref 0.50–1.35)
GFR calc Af Amer: >90 mL/min (ref >90)
GFR calc non Af Amer: >90 mL/min (ref >90)
Glucose, Bld: 86 mg/dL (ref 70–99)
Potassium: 3.9 mEq/L (ref 3.7–5.3)
Sodium: 143 mEq/L (ref 137–147)

## 2013-08-10 MED ORDER — OXYCODONE-ACETAMINOPHEN 5-325 MG PO TABS
2.0000 | ORAL_TABLET | Freq: Once | ORAL | Status: AC
  Administered 2013-08-10: 2 via ORAL
  Filled 2013-08-10: qty 2
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
## 2013-08-11 MED ORDER — OXYCODONE HCL 5 MG PO TABS: 5.0000 mg | ORAL_TABLET | ORAL | Status: DC | PRN

## 2013-08-11 NOTE — ED Provider Notes (Signed)
Medical screening examination/treatment/procedure(s) were performed by non-physician practitioner and as supervising physician I was immediately available for consultation/collaboration.  Carmin Muskrat, MD 08/11/13 [phone]

## 2013-08-11 NOTE — ED Notes (Signed)
Patient discharged home.

## 2013-08-11 NOTE — Discharge Instructions (Signed)
Be sure to follow up with your primary care provider and orthopedist for recheck of left arm.

## 2013-08-25 ENCOUNTER — Encounter (HOSPITAL_COMMUNITY): Payer: Self-pay | Admitting: Emergency Medicine

## 2013-08-25 ENCOUNTER — Emergency Department (HOSPITAL_COMMUNITY)
Admission: EM | Admit: 2013-08-25 | Discharge: 2013-08-26 | Disposition: A | Discharge status: Home / Self Care | Payer: PRIVATE HEALTH INSURANCE | Attending: Emergency Medicine | Admitting: Emergency Medicine

## 2013-08-25 ENCOUNTER — Emergency Department (HOSPITAL_COMMUNITY): Payer: PRIVATE HEALTH INSURANCE

## 2013-08-25 DIAGNOSIS — Z7982 Long term (current) use of aspirin: Secondary | ICD-10-CM | POA: Insufficient documentation

## 2013-08-25 DIAGNOSIS — Z79899 Other long term (current) drug therapy: Secondary | ICD-10-CM | POA: Insufficient documentation

## 2013-08-25 DIAGNOSIS — J438 Other emphysema: Secondary | ICD-10-CM | POA: Insufficient documentation

## 2013-08-25 DIAGNOSIS — Y92009 Unspecified place in unspecified non-institutional (private) residence as the place of occurrence of the external cause: Secondary | ICD-10-CM | POA: Insufficient documentation

## 2013-08-25 DIAGNOSIS — M129 Arthropathy, unspecified: Secondary | ICD-10-CM | POA: Insufficient documentation

## 2013-08-25 DIAGNOSIS — F17200 Nicotine dependence, unspecified, uncomplicated: Secondary | ICD-10-CM | POA: Insufficient documentation

## 2013-08-25 DIAGNOSIS — Z8619 Personal history of other infectious and parasitic diseases: Secondary | ICD-10-CM | POA: Insufficient documentation

## 2013-08-25 DIAGNOSIS — Y9389 Activity, other specified: Secondary | ICD-10-CM | POA: Insufficient documentation

## 2013-08-25 DIAGNOSIS — F1910 Other psychoactive substance abuse, uncomplicated: Secondary | ICD-10-CM

## 2013-08-25 DIAGNOSIS — Z8781 Personal history of (healed) traumatic fracture: Secondary | ICD-10-CM | POA: Insufficient documentation

## 2013-08-25 DIAGNOSIS — IMO0002 Reserved for concepts with insufficient information to code with codable children: Secondary | ICD-10-CM | POA: Insufficient documentation

## 2013-08-25 DIAGNOSIS — I252 Old myocardial infarction: Secondary | ICD-10-CM | POA: Insufficient documentation

## 2013-08-25 DIAGNOSIS — W010XXA Fall on same level from slipping, tripping and stumbling without subsequent striking against object, initial encounter: Secondary | ICD-10-CM | POA: Insufficient documentation

## 2013-08-25 DIAGNOSIS — W19XXXA Unspecified fall, initial encounter: Secondary | ICD-10-CM

## 2013-08-25 DIAGNOSIS — F1210 Cannabis abuse, uncomplicated: Secondary | ICD-10-CM | POA: Insufficient documentation

## 2013-08-25 NOTE — ED Provider Notes (Signed)
CSN: [id number]     Arrival date & time 08/25/13  2311 History   First MD Initiated Contact with Patient 08/25/13 2323     Chief Complaint  Patient presents with  . Fall     (Consider location/radiation/quality/duration/timing/severity/associated sxs/prior Treatment) HPI 58 year old male presents to the ER via EMS after a possible syncopal episode.  Patient reports that he tripped, patient's mother feels like he may have passed out.  Patient has been drinking and using marijuana tonight.  He has history of frequent falls.  Patient with recent ORIF of left arm.  Patient complaining of mid to low back pain area.  He is noted to have blood around his mouth and appears to have bit his upper lip.  No prior history of seizure.  He should reports that he is aware the whole time of his fall,  Past Medical History  Diagnosis Date  . Heart attack   . Arthritis   . Emphysema   . Hepatitis B   . Hepatitis C    Past Surgical History  Procedure Laterality Date  . Orif elbow fracture Left 07/02/2013    Procedure: Open Reduction Internal fixationof ulnar shaft with Type 1 Monteigga fracture with surgical reconstruction;  Surgeon: Roseanne Kaufman, MD;  Location: [place] [facility];  Service: Orthopedics;  Laterality: Left;   History reviewed. No pertinent family history. History  Substance Use Topics  . Smoking status: Current Some Day Smoker    Types: Cigars  . Smokeless tobacco: Not on file     Comment: went from cigarettes to cigars  . Alcohol Use: Yes     Comment: occassional    Review of Systems  See History of Present Illness; otherwise all other systems are reviewed and negative   Allergies  Review of patient's allergies indicates no known allergies.  Home Medications   Current Outpatient Rx  Name  Route  Sig  Dispense  Refill  . albuterol (ACCUNEB) 1.25 MG/3ML nebulizer solution   Nebulization   Take 1 ampule by nebulization every 6 (six) hours as needed for wheezing or shortness of  breath.         . ALPRAZolam (XANAX) 1 MG tablet   Oral   Take 1 mg by mouth 4 (four) times daily as needed for anxiety.         [name] aspirin EC 81 MG tablet   Oral   Take 81 mg by mouth every morning.         . carvedilol (COREG) 6.25 MG tablet   Oral   Take 6.25 mg by mouth 2 (two) times daily with a meal.         . escitalopram (LEXAPRO) 20 MG tablet   Oral   Take 20 mg by mouth every morning.          . Eszopiclone (ESZOPICLONE) 3 MG TABS   Oral   Take 3 mg by mouth at bedtime. Take immediately before bedtime (Lunesta)         . furosemide (LASIX) 20 MG tablet   Oral   Take 20 mg by mouth 2 (two) times daily.         [name] HYDROcodone-acetaminophen (NORCO/VICODIN) 5-325 MG per tablet   Oral   Take 1 tablet by mouth every 6 (six) hours as needed for moderate pain.         [name] lisinopril (PRINIVIL,ZESTRIL) 2.5 MG tablet   Oral   Take 2.5 mg by mouth daily.         [name]  mirtazapine (REMERON) 15 MG tablet   Oral   Take 15 mg by mouth at bedtime.         [name] oxyCODONE (OXY IR/ROXICODONE) 5 MG immediate release tablet   Oral   Take 1-2 tablets (5-10 mg total) by mouth every 4 (four) hours as needed for severe pain.   10 tablet   0   . potassium chloride (K-DUR) 10 MEQ tablet   Oral   Take 10 mEq by mouth 2 (two) times daily.         [name] spironolactone (ALDACTONE) 25 MG tablet   Oral   Take 25 mg by mouth daily.          . traZODone (DESYREL) 100 MG tablet   Oral   Take 100 mg by mouth at bedtime.         . busPIRone (BUSPAR) 15 MG tablet   Oral   Take 15 mg by mouth 3 (three) times daily.          BP 117/70  Pulse 80  Temp(Src) 98.7 F (37.1 C)  Resp 18  SpO2 95% Physical Exam  Nursing note and vitals reviewed. Constitutional: He is oriented to person, place, and time. He appears well-developed and well-nourished. No distress.  HENT:  Head: Normocephalic and atraumatic.  Right Ear: External ear normal.  Left Ear: External ear normal.   Nose: Nose normal.  Mouth/Throat: Oropharynx is clear and moist.  Eyes: Conjunctivae and EOM are normal. Pupils are equal, round, and reactive to light.  Neck: Normal range of motion. Neck supple. No JVD present. No tracheal deviation present. No thyromegaly present.  Pt immobilized on KED with ccollar and blocks in place.  With inline immobilization, pt was rolled from the long spine board and back was palpated inspecting for pain and step off/crepitus area.  None noted to cervical spine   Cardiovascular: Normal rate, regular rhythm, normal heart sounds and intact distal pulses.  Exam reveals no gallop and no friction rub.   No murmur heard. Pulmonary/Chest: Effort normal and breath sounds normal. No stridor. No respiratory distress. He has no wheezes. He has no rales. He exhibits no tenderness.  Abdominal: Soft. Bowel sounds are normal. He exhibits no distension and no mass. There is no tenderness. There is no rebound and no guarding.  Musculoskeletal: Normal range of motion. He exhibits no edema and no tenderness.  Mild thoracic and lumbar pain.  Both midline and paraspinal.  No step-off or crepitus  Lymphadenopathy:    He has no cervical adenopathy.  Neurological: He is alert and oriented to person, place, and time. He has normal reflexes. No cranial nerve deficit. He exhibits normal muscle tone. Coordination normal.  Skin: Skin is warm and dry. No rash noted. No erythema. No pallor.  Psychiatric: He has a normal mood and affect. His behavior is normal. Judgment and thought content normal.    ED Course  Procedures (including critical care time) Labs Review Labs Reviewed - No data to display Imaging Review Dg Thoracic Spine 2 View  08/26/2013   CLINICAL DATA:  Back pain after fall.  EXAM: THORACIC SPINE - 2 VIEW  COMPARISON:  None.  FINDINGS: There is no evidence of thoracic spine fracture. No spondylolisthesis is noted. Minimal dextroscoliosis is noted. No other significant bone  abnormalities are identified.  IMPRESSION: No acute abnormality seen in the thoracic spine.   Electronically Signed   By: Sabino Dick M.D.   On: 08/26/2013 01:16   Dg Lumbar  Spine 2-3 Views  08/26/2013   CLINICAL DATA:  Lower back pain after fall.  EXAM: LUMBAR SPINE - 2-3 VIEW  COMPARISON:  None.  FINDINGS: No fracture or spondylolisthesis is noted. Mild degenerative disc disease is noted at L3-4 and L4-5 with anterior osteophyte formation. Moderate degenerative disc disease identified at L5-S1.  IMPRESSION: Multilevel degenerative disc disease. No acute abnormality seen in the lumbar spine.   Electronically Signed   By: Roque Lias M.D.   On: 08/26/2013 01:15   Ct Head Wo Contrast  08/26/2013   CLINICAL DATA:  Fall.  Possible syncope.  EXAM: CT HEAD WITHOUT CONTRAST  CT MAXILLOFACIAL WITHOUT CONTRAST  CT CERVICAL SPINE WITHOUT CONTRAST  TECHNIQUE: Multidetector CT imaging of the head, cervical spine, and maxillofacial structures were performed using the standard protocol without intravenous contrast. Multiplanar CT image reconstructions of the cervical spine and maxillofacial structures were also generated.  COMPARISON:  CT scan of head of July 01, 2013; CT scan of cervical spine of July 02, 2013.  FINDINGS: CT HEAD FINDINGS  Bony calvarium appears intact. Mild diffuse cortical atrophy is noted. No mass effect or midline shift is noted. Ventricular size is within normal limits. There is no evidence of mass lesion, hemorrhage or acute infarction.  CT MAXILLOFACIAL FINDINGS  Paranasal sinuses appear normal. Globes and orbits appear normal. No acute fracture is noted. Old healed fracture of left zygomatic arch is noted.  CT CERVICAL SPINE FINDINGS  No fracture or significant spondylolisthesis is noted. Degenerative disc disease is noted at C5-6 and C6-7 with anterior osteophyte formation. Other disc spaces appear well maintained. No significant abnormality seen involving posterior facet joints.   IMPRESSION: Mild diffuse cortical atrophy. No acute intracranial abnormality seen.  Old left zygomatic arch fracture. No acute abnormality seen in the maxillofacial region.  Degenerative disc disease is noted at C5-6 and C6-7. No acute abnormality seen in the cervical spine.   Electronically Signed   By: Roque Lias M.D.   On: 08/26/2013 00:48   Ct Cervical Spine Wo Contrast  08/26/2013   CLINICAL DATA:  Fall.  Possible syncope.  EXAM: CT HEAD WITHOUT CONTRAST  CT MAXILLOFACIAL WITHOUT CONTRAST  CT CERVICAL SPINE WITHOUT CONTRAST  TECHNIQUE: Multidetector CT imaging of the head, cervical spine, and maxillofacial structures were performed using the standard protocol without intravenous contrast. Multiplanar CT image reconstructions of the cervical spine and maxillofacial structures were also generated.  COMPARISON:  CT scan of head of July 01, 2013; CT scan of cervical spine of July 02, 2013.  FINDINGS: CT HEAD FINDINGS  Bony calvarium appears intact. Mild diffuse cortical atrophy is noted. No mass effect or midline shift is noted. Ventricular size is within normal limits. There is no evidence of mass lesion, hemorrhage or acute infarction.  CT MAXILLOFACIAL FINDINGS  Paranasal sinuses appear normal. Globes and orbits appear normal. No acute fracture is noted. Old healed fracture of left zygomatic arch is noted.  CT CERVICAL SPINE FINDINGS  No fracture or significant spondylolisthesis is noted. Degenerative disc disease is noted at C5-6 and C6-7 with anterior osteophyte formation. Other disc spaces appear well maintained. No significant abnormality seen involving posterior facet joints.  IMPRESSION: Mild diffuse cortical atrophy. No acute intracranial abnormality seen.  Old left zygomatic arch fracture. No acute abnormality seen in the maxillofacial region.  Degenerative disc disease is noted at C5-6 and C6-7. No acute abnormality seen in the cervical spine.   Electronically Signed   By: Marry Guan.D.  On: 08/26/2013 00:48   Ct Maxillofacial Wo Cm  08/26/2013   CLINICAL DATA:  Fall.  Possible syncope.  EXAM: CT HEAD WITHOUT CONTRAST  CT MAXILLOFACIAL WITHOUT CONTRAST  CT CERVICAL SPINE WITHOUT CONTRAST  TECHNIQUE: Multidetector CT imaging of the head, cervical spine, and maxillofacial structures were performed using the standard protocol without intravenous contrast. Multiplanar CT image reconstructions of the cervical spine and maxillofacial structures were also generated.  COMPARISON:  CT scan of head of July 01, 2013; CT scan of cervical spine of July 02, 2013.  FINDINGS: CT HEAD FINDINGS  Bony calvarium appears intact. Mild diffuse cortical atrophy is noted. No mass effect or midline shift is noted. Ventricular size is within normal limits. There is no evidence of mass lesion, hemorrhage or acute infarction.  CT MAXILLOFACIAL FINDINGS  Paranasal sinuses appear normal. Globes and orbits appear normal. No acute fracture is noted. Old healed fracture of left zygomatic arch is noted.  CT CERVICAL SPINE FINDINGS  No fracture or significant spondylolisthesis is noted. Degenerative disc disease is noted at C5-6 and C6-7 with anterior osteophyte formation. Other disc spaces appear well maintained. No significant abnormality seen involving posterior facet joints.  IMPRESSION: Mild diffuse cortical atrophy. No acute intracranial abnormality seen.  Old left zygomatic arch fracture. No acute abnormality seen in the maxillofacial region.  Degenerative disc disease is noted at C5-6 and C6-7. No acute abnormality seen in the cervical spine.   Electronically Signed   By: Sabino Dick M.D.   On: 08/26/2013 00:48     EKG Interpretation None      MDM   Final diagnoses:  Fall at home  Polysubstance abuse    58 year old male with syncope versus mechanical fall.  Plan for head, face, C-spine CT scans, lumbar and thoracic films.  Patient neurovascularly intact, suspect mechanical fall, as he reports  remembering the entire event.  2:18 AM Workup here unremarkable.  Patient is instructed not to drink alcohol and smoke marijuana while taking oxycodone and Xanax.  Patient has ambulated.  Plan to discharge home    Kalman Drape, MD 08/26/13 [phone]

## 2013-08-25 NOTE — ED Notes (Signed)
KED removed by EDP.

## 2013-08-25 NOTE — ED Notes (Signed)
Pt from home with possible syncopal episode.  Per EMS pt's mother sts pt fell and had "seizure-like" activity.  Pt not reporting any new pain except for intensified lower back pain.  Pt is on KED.  Pt does admit to drinking 6-7 beers and 2 marijuana joints. Nad.

## 2013-08-26 ENCOUNTER — Emergency Department (HOSPITAL_COMMUNITY): Payer: PRIVATE HEALTH INSURANCE

## 2013-08-26 NOTE — ED Notes (Signed)
Pt transported to CT ?

## 2013-08-26 NOTE — ED Notes (Signed)
Sister Neoma Laming notified about pt being discharged and needing a ride home.

## 2013-08-26 NOTE — ED Notes (Signed)
Pt back from X-ray.  

## 2013-08-26 NOTE — Discharge Instructions (Signed)
Your workup today did not show any new injuries.  It is very dangerous to drink alcohol and use drugs while you are on other medications.  Rest today.  Use your cane when getting up.   Fall Prevention and Home Safety Falls cause injuries and can affect all age groups. It is possible to use preventive measures to significantly decrease the likelihood of falls. There are many simple measures which can make your home safer and prevent falls. OUTDOORS  Repair cracks and edges of walkways and driveways.  Remove high doorway thresholds.  Trim shrubbery on the main path into your home.  Have good outside lighting.  Clear walkways of tools, rocks, debris, and clutter.  Check that handrails are not broken and are securely fastened. Both sides of steps should have handrails.  Have leaves, snow, and ice cleared regularly.  Use sand or salt on walkways during winter months.  In the garage, clean up grease or oil spills. BATHROOM  Install night lights.  Install grab bars by the toilet and in the tub and shower.  Use non-skid mats or decals in the tub or shower.  Place a plastic non-slip stool in the shower to sit on, if needed.  Keep floors dry and clean up all water on the floor immediately.  Remove soap buildup in the tub or shower on a regular basis.  Secure bath mats with non-slip, double-sided rug tape.  Remove throw rugs and tripping hazards from the floors. BEDROOMS  Install night lights.  Make sure a bedside light is easy to reach.  Do not use oversized bedding.  Keep a telephone by your bedside.  Have a firm chair with side arms to use for getting dressed.  Remove throw rugs and tripping hazards from the floor. KITCHEN  Keep handles on pots and pans turned toward the center of the stove. Use back burners when possible.  Clean up spills quickly and allow time for drying.  Avoid walking on wet floors.  Avoid hot utensils and knives.  Position shelves so they  are not too high or low.  Place commonly used objects within easy reach.  If necessary, use a sturdy step stool with a grab bar when reaching.  Keep electrical cables out of the way.  Do not use floor polish or wax that makes floors slippery. If you must use wax, use non-skid floor wax.  Remove throw rugs and tripping hazards from the floor. STAIRWAYS  Never leave objects on stairs.  Place handrails on both sides of stairways and use them. Fix any loose handrails. Make sure handrails on both sides of the stairways are as long as the stairs.  Check carpeting to make sure it is firmly attached along stairs. Make repairs to worn or loose carpet promptly.  Avoid placing throw rugs at the top or bottom of stairways, or properly secure the rug with carpet tape to prevent slippage. Get rid of throw rugs, if possible.  Have an electrician put in a light switch at the top and bottom of the stairs. OTHER FALL PREVENTION TIPS  Wear low-heel or rubber-soled shoes that are supportive and fit well. Wear closed toe shoes.  When using a stepladder, make sure it is fully opened and both spreaders are firmly locked. Do not climb a closed stepladder.  Add color or contrast paint or tape to grab bars and handrails in your home. Place contrasting color strips on first and last steps.  Learn and use mobility aids as needed. Install  an electrical emergency response system.  Turn on lights to avoid dark areas. Replace light bulbs that burn out immediately. Get light switches that glow.  Arrange furniture to create clear pathways. Keep furniture in the same place.  Firmly attach carpet with non-skid or double-sided tape.  Eliminate uneven floor surfaces.  Select a carpet pattern that does not visually hide the edge of steps.  Be aware of all pets. OTHER HOME SAFETY TIPS  Set the water temperature for 120 F (48.8 C).  Keep emergency numbers on or near the telephone.  Keep smoke detectors on  every level of the home and near sleeping areas. Document Released: 05/23/2002 Document Revised: 12/02/2011 Document Reviewed: 08/22/2011 [facility] Patient Information 2014 [place].  Polysubstance Abuse When people abuse more than one drug or type of drug it is called polysubstance or polydrug abuse. For example, many smokers also drink alcohol. This is one form of polydrug abuse. Polydrug abuse also refers to the use of a drug to counteract an unpleasant effect produced by another drug. It may also be used to help with withdrawal from another drug. People who take stimulants may become agitated. Sometimes this agitation is countered with a tranquilizer. This helps protect against the unpleasant side effects. Polydrug abuse also refers to the use of different drugs at the same time.  Anytime drug use is interfering with normal living activities, it has become abuse. This includes problems with family and friends. Psychological dependence has developed when your mind tells you that the drug is needed. This is usually followed by physical dependence which has developed when continuing increases of drug are required to get the same feeling or "high". This is known as addiction or chemical dependency. A person's risk is much higher if there is a history of chemical dependency in the family. SIGNS OF CHEMICAL DEPENDENCY  You have been told by friends or family that drugs have become a problem.  You fight when using drugs.  You are having blackouts (not remembering what you do while using).  You feel sick from using drugs but continue using.  You lie about use or amounts of drugs (chemicals) used.  You need chemicals to get you going.  You are suffering in work performance or in school because of drug use.  You get sick from use of drugs but continue to use anyway.  You need drugs to relate to people or feel comfortable in social situations.  You use drugs to forget problems. "Yes"  answered to any of the above signs of chemical dependency indicates there are problems. The longer the use of drugs continues, the greater the problems will become. If there is a family history of drug or alcohol use, it is best not to experiment with these drugs. Continual use leads to tolerance. After tolerance develops more of the drug is needed to get the same feeling. This is followed by addiction. With addiction, drugs become the most important part of life. It becomes more important to take drugs than participate in the other usual activities of life. This includes relating to friends and family. Addiction is followed by dependency. Dependency is a condition where drugs are now needed not just to get high, but to feel normal. Addiction cannot be cured but it can be stopped. This often requires outside help and the care of professionals. Treatment centers are listed in the yellow pages under: Cocaine, Narcotics, and Alcoholics Anonymous. Most hospitals and clinics can refer you to a specialized care center.  Talk to your caregiver if you need help. Document Released: 01/22/2005 Document Revised: 08/25/2011 Document Reviewed: 06/02/2005 [facility] Patient Information 2014 [place].   Emergency Department Resource Guide 1) Find a Doctor and Pay Out of Pocket Although you won't have to find out who is covered by your insurance plan, it is a good idea to ask around and get recommendations. You will then need to call the office and see if the doctor you have chosen will accept you as a new patient and what types of options they offer for patients who are self-pay. Some doctors offer discounts or will set up payment plans for their patients who do not have insurance, but you will need to ask so you aren't surprised when you get to your appointment.  2) Contact Your Local Health Department Not all health departments have doctors that can see patients for sick visits, but many do, so it is worth a call  to see if yours does. If you don't know where your local health department is, you can check in your phone book. The CDC also has a tool to help you locate your state's health department, and many state websites also have listings of all of their local health departments.  3) Find a [facility] If your illness is not likely to be very severe or complicated, you may want to try a walk in clinic. These are popping up all over the country in pharmacies, drugstores, and shopping centers. They're usually staffed by nurse practitioners or physician assistants that have been trained to treat common illnesses and complaints. They're usually fairly quick and inexpensive. However, if you have serious medical issues or chronic medical problems, these are probably not your best option.  No Primary Care Doctor: - Call Health Connect at  [phone] - they can help you locate a primary care doctor that  accepts your insurance, provides certain services, etc. - Physician Referral Service- [phone]  Chronic Pain Problems: Organization         Address  Phone   Notes  [facility]  [phone] Patients need to be referred by their primary care doctor.   Medication Assistance: Organization         Address  Phone   Notes  [facility] Medication [facility] [address] [place], [place] [address] [phone] --Must be a resident of [facility] -- Must have NO insurance coverage whatsoever (no Medicaid/ Medicare, etc.) -- The pt. MUST have a primary care doctor that directs their care regularly and follows them in the community   [facility]  [phone]   [employer]  [phone]    Agencies that provide inexpensive medical care: Organization         Address  Phone   Notes  [facility]  [phone]   [facility]    [phone]   [facility] [place], [place] [address] [phone]   [place] [address] [place] [phone]   Planned Parenthood    [phone]   [facility]    [phone]   [place] and [place] [address], [place] Phone:  [phone], Fax:  [phone] Hours of Operation:  9 am - 6 pm, M-F.  Also accepts Medicaid/Medicare and self-pay.  [facility] for [place] [address], [employer] Phone: (  [phone], Fax: [phone]. Hours of Operation:  8:30 am - 5:30 pm, M-F.  Also accepts Medicaid and self-pay.  [facility] [place] [address], [place] Phone: [phone]   [place], [place], [place] [phone], Ext. [phone] Mondays & Thursdays: 7-9 AM.  First 15 patients are seen on a first come, first serve basis.    [facility]:  Organization         Address  Phone   Notes  [facility] [address],  [phone] Also accepts self-pay patients.  [facility] [id number] [place], [place]  [phone]   [place], [address], [place] [phone]   [facility] [address], [place] [phone]   Lucianne Lei [address], [place]   [phone] Only accepts [place] Access [place] patients after they have their name applied to their card.   Self-Pay (no insurance) in [facility]:  Organization         Address  Phone   Notes  Sickle Cell Patients, [facility] [place] [phone]   [facility] Urgent Care [place] [phone]   [facility] [place]  [place], [place], [place] [phone]   [facility]/Dr. Osei-Bonsu  [address] [place] or [address], [place] [phone] Phone number for both [place] and [place] locations is the same.  [facility] [address], [place] [phone]   [facility] [address], [place] or [address] [phone]   [facility] [address], [place] [phone], phone; [phone], fax Sees patients 1st and 3rd Saturday of every month.  Must not qualify for public or private insurance (i.e. Medicaid, Medicare, [place] Health Choice, Veterans' Benefits)  Household income should be no more than 200% of the poverty level The clinic cannot treat you if you are pregnant or think you are pregnant  Sexually transmitted diseases are not treated at the clinic.    Dental Care: Organization         Address  Phone  Notes  [facility] Department of [facility] [phone] Accepts children up to age 39 who are enrolled in [place] or [place]; pregnant women with a Medicaid card; and children who have applied for Medicaid or [place] Health Choice, but were declined, whose parents can pay a reduced fee at time of service.  [facility] Department of [facility]  [address] [place] [address], [place] [phone] Accepts children up to age 59 who are enrolled in [place] or [place]; pregnant women with a Medicaid card; and children who have applied for Medicaid or [place] Health Choice, but were declined, whose parents can pay a reduced fee at time of service.  [facility] [facility] PRO[facility]  [place] [phone] Patients are seen by appointment only. Walk-ins are not accepted. [place] will see patients 70 years of age and older. Monday - Tuesday (8am-5pm) Most Wednesdays (8:30-5pm) $30 per visit, cash only  [place] Adult [facility] Access PRO[facility]  [address], [employer]  [place] [phone] Patients are  seen by appointment only. Walk-ins are not accepted. [place] will see patients 45 years of age and older. One Wednesday Evening (Monthly: Volunteer Based).  $30 per visit, cash only  [place]  [phone] for adults; Children under age 66, call Graduate Pediatric Dentistry at [phone]. Children aged 31-14, please call [phone] to request a pediatric application.  Dental services are provided in all areas of dental care including fillings, crowns and bridges, complete and partial dentures, implants, gum treatment, root canals, and extractions. Preventive care is also provided. Treatment is provided to both adults and children. Patients are selected via a lottery and there is often a waiting list.   [facility] [address], [place]  [phone] [email or web address]   [facility] [address] [place], [place] [phone], Ext. [phone] Second and Fourth Thursday of each month, opens at 6:30 AM; Clinic ends at 9 AM.  Patients are seen on a first-come first-served basis, and a limited number are seen during each clinic.   [facility]  [address] Hillard Danker [place], [place] [phone]   Eligibility Requirements You must have lived in [place], [place], or [place] counties for at least the last three months.   You cannot be eligible for state or federal sponsored [employer], including [employer], [place], or [employer].   You generally cannot be eligible for healthcare insurance through your employer.    How to apply: Eligibility screenings are held every Tuesday and Wednesday afternoon from 1:00 pm until 4:00 pm. You do not need an appointment for the interview!  [facility] [address] [place], [place]   [place]  [place] Department  [place]  [phone]     Behavioral Health Resources in the Community: Intensive Outpatient Programs Organization         Address  Phone  Notes  [place]. [address], [place], [place] [phone]   [facility] [address], [place], [place]   ADS: Alcohol & Drug S[facility]cs [address], [place], [place]   [place] [address],  [place], [place] or [phone]   Substance Abuse Resources Organization         Address  Phone  Notes  Alcohol and Drug Services  [phone]   [place]  [phone]   [place]   [place]  [phone]   Residential & Outpatient Substance Abuse Program  [phone]   Psychological Services Organization         Address  Phone  Notes  [facility] [place]  [place]  [phone]   [place] [address], [place] or [phone]    Mobile Crisis Teams Organization         Address  Phone  Notes  [facility], Mobile Crisis Care Unit  [phone]   [facility]  [address] [place], [place]   [place] [address] [place] [phone]    Self-Help/Support Groups Organization         Address  Phone             Notes  [place]. of [place] - variety of support groups  336- H3156881 Call for more information  Narcotics Anonymous (NA), [facility] [address], [employer] [place]  2 meetings at this location   Residential Facilities manager         Address  Phone  Notes  [facility] [place],    [place]  [phone]   [facility]  [address] [place] [phone], [place], [place]   [place], [place] [phone] Admissions: 8am-3pm M-F  Incentives  Substance [place] [address]    [place], [place] [phone]   [facility] [address] [place], [place], [place]   [facility] [address],  [place], [place]   [facility] - Intensive Outpatient [address], [place], [place]   [facility] ([place].) [place].,  [place], [place] [phone] or [phone]   [facility] (RTS) [address], [place], [place] Accepts Medicaid  [facility] [place] [address]  [place] [phone] Substance Abuse/Addiction Treatment   [facility] Organization         Address  Phone  Notes  [facility]  9857569272   Domenic Schwab, PhD [address] [place]lis [place], [place]   [phone] or [phone]   Charlotte Gerber [place], [place] [phone]   [facility] [address] [place], [place] [phone] Insurance/Medicaid/sponsorship through [facility] and Families [address]                                    [place], [place] [phone] [place] [address], [place] [phone]    Dr. Adele Schilder  [phone]   [facility] Dept. 1) [address], [place] 2) [place] 3)  [address], [place] [phone]  [phone]   [place] [phone] or [phone] (After Hours)

## 2013-08-27 ENCOUNTER — Emergency Department (HOSPITAL_COMMUNITY): Payer: PRIVATE HEALTH INSURANCE

## 2013-08-27 ENCOUNTER — Encounter (HOSPITAL_COMMUNITY): Payer: Self-pay | Admitting: Emergency Medicine

## 2013-08-27 ENCOUNTER — Inpatient Hospital Stay (HOSPITAL_COMMUNITY)
Admission: EM | Admit: 2013-08-27 | Discharge: 2013-09-01 | DRG: 896 | Disposition: A | Discharge status: Skilled Nursing Facility | Payer: PRIVATE HEALTH INSURANCE | Attending: Family Medicine | Admitting: Family Medicine

## 2013-08-27 ENCOUNTER — Inpatient Hospital Stay (HOSPITAL_COMMUNITY): Payer: PRIVATE HEALTH INSURANCE

## 2013-08-27 DIAGNOSIS — F3289 Other specified depressive episodes: Secondary | ICD-10-CM | POA: Diagnosis present

## 2013-08-27 DIAGNOSIS — F1320 Sedative, hypnotic or anxiolytic dependence, uncomplicated: Secondary | ICD-10-CM | POA: Diagnosis present

## 2013-08-27 DIAGNOSIS — S52209A Unspecified fracture of shaft of unspecified ulna, initial encounter for closed fracture: Secondary | ICD-10-CM

## 2013-08-27 DIAGNOSIS — G40909 Epilepsy, unspecified, not intractable, without status epilepticus: Secondary | ICD-10-CM | POA: Diagnosis present

## 2013-08-27 DIAGNOSIS — Z79899 Other long term (current) drug therapy: Secondary | ICD-10-CM

## 2013-08-27 DIAGNOSIS — F1020 Alcohol dependence, uncomplicated: Secondary | ICD-10-CM | POA: Diagnosis present

## 2013-08-27 DIAGNOSIS — I10 Essential (primary) hypertension: Secondary | ICD-10-CM

## 2013-08-27 DIAGNOSIS — I2583 Coronary atherosclerosis due to lipid rich plaque: Secondary | ICD-10-CM | POA: Diagnosis present

## 2013-08-27 DIAGNOSIS — R509 Fever, unspecified: Secondary | ICD-10-CM | POA: Diagnosis present

## 2013-08-27 DIAGNOSIS — R569 Unspecified convulsions: Secondary | ICD-10-CM | POA: Diagnosis present

## 2013-08-27 DIAGNOSIS — F19939 Other psychoactive substance use, unspecified with withdrawal, unspecified: Secondary | ICD-10-CM | POA: Diagnosis present

## 2013-08-27 DIAGNOSIS — G40409 Other generalized epilepsy and epileptic syndromes, not intractable, without status epilepticus: Secondary | ICD-10-CM | POA: Diagnosis present

## 2013-08-27 DIAGNOSIS — F1210 Cannabis abuse, uncomplicated: Secondary | ICD-10-CM | POA: Diagnosis present

## 2013-08-27 DIAGNOSIS — I498 Other specified cardiac arrhythmias: Secondary | ICD-10-CM | POA: Diagnosis not present

## 2013-08-27 DIAGNOSIS — F1911 Other psychoactive substance abuse, in remission: Secondary | ICD-10-CM | POA: Diagnosis present

## 2013-08-27 DIAGNOSIS — Z9181 History of falling: Secondary | ICD-10-CM

## 2013-08-27 DIAGNOSIS — F19239 Other psychoactive substance dependence with withdrawal, unspecified: Secondary | ICD-10-CM | POA: Diagnosis present

## 2013-08-27 DIAGNOSIS — Z87820 Personal history of traumatic brain injury: Secondary | ICD-10-CM

## 2013-08-27 DIAGNOSIS — F17200 Nicotine dependence, unspecified, uncomplicated: Secondary | ICD-10-CM | POA: Diagnosis present

## 2013-08-27 DIAGNOSIS — I252 Old myocardial infarction: Secondary | ICD-10-CM

## 2013-08-27 DIAGNOSIS — D696 Thrombocytopenia, unspecified: Secondary | ICD-10-CM | POA: Diagnosis present

## 2013-08-27 DIAGNOSIS — F1010 Alcohol abuse, uncomplicated: Secondary | ICD-10-CM

## 2013-08-27 DIAGNOSIS — F329 Major depressive disorder, single episode, unspecified: Secondary | ICD-10-CM | POA: Diagnosis present

## 2013-08-27 DIAGNOSIS — I621 Nontraumatic extradural hemorrhage: Secondary | ICD-10-CM | POA: Diagnosis present

## 2013-08-27 DIAGNOSIS — F411 Generalized anxiety disorder: Secondary | ICD-10-CM | POA: Diagnosis present

## 2013-08-27 DIAGNOSIS — Z7982 Long term (current) use of aspirin: Secondary | ICD-10-CM

## 2013-08-27 DIAGNOSIS — J438 Other emphysema: Secondary | ICD-10-CM | POA: Diagnosis present

## 2013-08-27 DIAGNOSIS — E8779 Other fluid overload: Secondary | ICD-10-CM | POA: Diagnosis present

## 2013-08-27 DIAGNOSIS — B1920 Unspecified viral hepatitis C without hepatic coma: Secondary | ICD-10-CM | POA: Diagnosis present

## 2013-08-27 DIAGNOSIS — R29898 Other symptoms and signs involving the musculoskeletal system: Secondary | ICD-10-CM | POA: Diagnosis not present

## 2013-08-27 DIAGNOSIS — I2510 Atherosclerotic heart disease of native coronary artery without angina pectoris: Secondary | ICD-10-CM | POA: Diagnosis present

## 2013-08-27 DIAGNOSIS — F10939 Alcohol use, unspecified with withdrawal, unspecified: Principal | ICD-10-CM | POA: Diagnosis present

## 2013-08-27 DIAGNOSIS — B1910 Unspecified viral hepatitis B without hepatic coma: Secondary | ICD-10-CM | POA: Diagnosis present

## 2013-08-27 DIAGNOSIS — J449 Chronic obstructive pulmonary disease, unspecified: Secondary | ICD-10-CM

## 2013-08-27 DIAGNOSIS — G4700 Insomnia, unspecified: Secondary | ICD-10-CM | POA: Diagnosis present

## 2013-08-27 DIAGNOSIS — F10239 Alcohol dependence with withdrawal, unspecified: Principal | ICD-10-CM | POA: Diagnosis present

## 2013-08-27 HISTORY — DX: Unspecified convulsions: R56.9

## 2013-08-27 HISTORY — DX: Reserved for concepts with insufficient information to code with codable children: IMO0002

## 2013-08-27 LAB — ETHANOL: Alcohol, Ethyl (B): <11 mg/dL (ref 0–11)

## 2013-08-27 LAB — GRAM STAIN: Special Requests: NORMAL

## 2013-08-27 LAB — BASIC METABOLIC PANEL
BUN: 9 mg/dL (ref 6–23)
CO2: 18 mEq/L — ABNORMAL LOW (ref 19–32)
Calcium: 9.2 mg/dL (ref 8.4–10.5)
Chloride: 102 mEq/L (ref 96–112)
Creatinine, Ser: 0.85 mg/dL (ref 0.50–1.35)
GFR calc Af Amer: >90 mL/min (ref >90)
GFR calc non Af Amer: >90 mL/min (ref >90)
Glucose, Bld: 109 mg/dL — ABNORMAL HIGH (ref 70–99)
Potassium: 4.1 mEq/L (ref 3.7–5.3)
Sodium: 141 mEq/L (ref 137–147)

## 2013-08-27 LAB — CBC
HCT: 40.1 % (ref 39.0–52.0)
Hemoglobin: 13.6 g/dL (ref 13.0–17.0)
MCH: 32.5 pg (ref 26.0–34.0)
MCHC: 33.9 g/dL (ref 30.0–36.0)
MCV: 95.9 fL (ref 78.0–100.0)
Platelets: 80 10*3/uL — ABNORMAL LOW (ref 150–400)
RBC: 4.18 MIL/uL — ABNORMAL LOW (ref 4.22–5.81)
RDW: 14.9 % (ref 11.5–15.5)
WBC: 5.9 10*3/uL (ref 4.0–10.5)

## 2013-08-27 LAB — GLUCOSE, CSF: Glucose, CSF: 62 mg/dL (ref 43–76)

## 2013-08-27 LAB — RAPID URINE DRUG SCREEN, HOSP PERFORMED
Amphetamines: NOT DETECTED
Barbiturates: NOT DETECTED
Benzodiazepines: POSITIVE — AB
Cocaine: NOT DETECTED
Opiates: NOT DETECTED
Tetrahydrocannabinol: POSITIVE — AB

## 2013-08-27 LAB — CSF CELL COUNT WITH DIFFERENTIAL
RBC Count, CSF: 7 /mm3 — ABNORMAL HIGH
Tube #: 1
WBC, CSF: 1 /mm3 (ref 0–5)

## 2013-08-27 LAB — CBG MONITORING, ED: Glucose-Capillary: 90 mg/dL (ref 70–99)

## 2013-08-27 LAB — PROTEIN, CSF: Total  Protein, CSF: 29 mg/dL (ref 15–45)

## 2013-08-27 LAB — URINALYSIS, ROUTINE W REFLEX MICROSCOPIC
Bilirubin Urine: NEGATIVE
Glucose, UA: NEGATIVE mg/dL
Hgb urine dipstick: NEGATIVE
Ketones, ur: NEGATIVE mg/dL
Leukocytes, UA: NEGATIVE
Nitrite: NEGATIVE
Protein, ur: NEGATIVE mg/dL
Specific Gravity, Urine: 1.019 (ref 1.005–1.030)
Urobilinogen, UA: 1 mg/dL (ref 0.0–1.0)
pH: 5 (ref 5.0–8.0)

## 2013-08-27 MED ORDER — LORAZEPAM 2 MG/ML IJ SOLN
1.0000 mg | Freq: Four times a day (QID) | INTRAMUSCULAR | Status: AC | PRN
  Filled 2013-08-27: qty 1

## 2013-08-27 MED ORDER — THIAMINE HCL 100 MG/ML IJ SOLN
100.0000 mg | Freq: Every day | INTRAMUSCULAR | Status: DC
  Administered 2013-08-27: 100 mg via INTRAVENOUS
  Filled 2013-08-27: qty 1
  Filled 2013-08-27: qty 2

## 2013-08-27 MED ORDER — ESCITALOPRAM OXALATE 20 MG PO TABS
20.0000 mg | ORAL_TABLET | Freq: Every morning | ORAL | Status: DC
  Administered 2013-08-28 – 2013-09-01 (×5): 20 mg via ORAL
  Filled 2013-08-27 (×5): qty 1

## 2013-08-27 MED ORDER — LORAZEPAM 1 MG PO TABS: 1.0000 mg | ORAL_TABLET | Freq: Four times a day (QID) | ORAL | Status: DC | PRN

## 2013-08-27 MED ORDER — LORAZEPAM 2 MG/ML IJ SOLN
0.0000 mg | Freq: Two times a day (BID) | INTRAMUSCULAR | Status: AC
  Administered 2013-08-27: 2 mg via INTRAVENOUS
  Filled 2013-08-27: qty 1

## 2013-08-27 MED ORDER — VANCOMYCIN HCL 10 G IV SOLR
2000.0000 mg | Freq: Once | INTRAVENOUS | Status: AC
  Administered 2013-08-27: 2000 mg via INTRAVENOUS
  Filled 2013-08-27: qty 2000

## 2013-08-27 MED ORDER — SPIRONOLACTONE 25 MG PO TABS
25.0000 mg | ORAL_TABLET | Freq: Every day | ORAL | Status: DC
  Administered 2013-08-28 – 2013-09-01 (×5): 25 mg via ORAL
  Filled 2013-08-27 (×5): qty 1

## 2013-08-27 MED ORDER — HEPARIN SODIUM (PORCINE) 5000 UNIT/ML IJ SOLN
5000.0000 [IU] | Freq: Three times a day (TID) | INTRAMUSCULAR | Status: DC
  Administered 2013-08-27 – 2013-09-01 (×14): 5000 [IU] via SUBCUTANEOUS
  Filled 2013-08-27 (×17): qty 1

## 2013-08-27 MED ORDER — ALBUTEROL SULFATE (2.5 MG/3ML) 0.083% IN NEBU: 1.2500 mg | INHALATION_SOLUTION | Freq: Four times a day (QID) | RESPIRATORY_TRACT | Status: DC | PRN

## 2013-08-27 MED ORDER — CARVEDILOL 6.25 MG PO TABS
6.2500 mg | ORAL_TABLET | Freq: Two times a day (BID) | ORAL | Status: DC
  Administered 2013-08-28 – 2013-08-29 (×4): 6.25 mg via ORAL
  Filled 2013-08-27 (×7): qty 1

## 2013-08-27 MED ORDER — SODIUM CHLORIDE 0.9 % IV SOLN
INTRAVENOUS | Status: DC
  Administered 2013-08-27: 22:00:00 via INTRAVENOUS

## 2013-08-27 MED ORDER — MIRTAZAPINE 15 MG PO TABS
15.0000 mg | ORAL_TABLET | Freq: Every day | ORAL | Status: DC
  Administered 2013-08-27 – 2013-08-31 (×5): 15 mg via ORAL
  Filled 2013-08-27 (×6): qty 1

## 2013-08-27 MED ORDER — VITAMIN B-1 100 MG PO TABS: 100.0000 mg | ORAL_TABLET | Freq: Every day | ORAL | Status: DC

## 2013-08-27 MED ORDER — LISINOPRIL 2.5 MG PO TABS
2.5000 mg | ORAL_TABLET | Freq: Every day | ORAL | Status: DC
  Administered 2013-08-28 – 2013-09-01 (×5): 2.5 mg via ORAL
  Filled 2013-08-27 (×5): qty 1

## 2013-08-27 MED ORDER — ADULT MULTIVITAMIN W/MINERALS CH
1.0000 | ORAL_TABLET | Freq: Every day | ORAL | Status: DC
  Administered 2013-08-28 – 2013-09-01 (×5): 1 via ORAL
  Filled 2013-08-27 (×5): qty 1

## 2013-08-27 MED ORDER — FOLIC ACID 1 MG PO TABS
1.0000 mg | ORAL_TABLET | Freq: Every day | ORAL | Status: DC
Start: 2013-08-28 — End: 2013-09-01
  Administered 2013-08-28 – 2013-09-01 (×5): 1 mg via ORAL
  Filled 2013-08-27 (×5): qty 1

## 2013-08-27 MED ORDER — LORAZEPAM 2 MG/ML IJ SOLN
0.0000 mg | Freq: Four times a day (QID) | INTRAMUSCULAR | Status: AC
  Administered 2013-08-27: 2 mg via INTRAVENOUS
  Administered 2013-08-28: 1 mg via INTRAVENOUS
  Filled 2013-08-27: qty 1

## 2013-08-27 MED ORDER — BUSPIRONE HCL 15 MG PO TABS
15.0000 mg | ORAL_TABLET | Freq: Three times a day (TID) | ORAL | Status: DC
  Administered 2013-08-27 – 2013-09-01 (×13): 15 mg via ORAL
  Filled 2013-08-27 (×16): qty 1

## 2013-08-27 MED ORDER — LORAZEPAM 1 MG PO TABS: 0.0000 mg | ORAL_TABLET | Freq: Four times a day (QID) | ORAL | Status: DC

## 2013-08-27 MED ORDER — FUROSEMIDE 20 MG PO TABS
20.0000 mg | ORAL_TABLET | Freq: Two times a day (BID) | ORAL | Status: DC
  Administered 2013-08-28 – 2013-09-01 (×8): 20 mg via ORAL
  Filled 2013-08-27 (×11): qty 1

## 2013-08-27 MED ORDER — VITAMIN B-1 100 MG PO TABS
100.0000 mg | ORAL_TABLET | Freq: Every day | ORAL | Status: DC
  Administered 2013-08-28 – 2013-09-01 (×5): 100 mg via ORAL
  Filled 2013-08-27 (×5): qty 1

## 2013-08-27 MED ORDER — DEXTROSE 5 % IV SOLN
2.0000 g | Freq: Once | INTRAVENOUS | Status: AC
  Administered 2013-08-27: 2 g via INTRAVENOUS
  Filled 2013-08-27: qty 2

## 2013-08-27 MED ORDER — SODIUM CHLORIDE 0.9 % IJ SOLN
3.0000 mL | Freq: Two times a day (BID) | INTRAMUSCULAR | Status: DC
  Administered 2013-08-28 – 2013-08-31 (×6): 3 mL via INTRAVENOUS

## 2013-08-27 MED ORDER — TRAZODONE HCL 100 MG PO TABS
100.0000 mg | ORAL_TABLET | Freq: Every day | ORAL | Status: DC
  Administered 2013-08-27 – 2013-08-31 (×5): 100 mg via ORAL
  Filled 2013-08-27 (×6): qty 1

## 2013-08-27 MED ORDER — ASPIRIN EC 81 MG PO TBEC
81.0000 mg | DELAYED_RELEASE_TABLET | Freq: Every morning | ORAL | Status: DC
  Administered 2013-08-28 – 2013-09-01 (×5): 81 mg via ORAL
  Filled 2013-08-27 (×5): qty 1

## 2013-08-27 MED ORDER — POTASSIUM CHLORIDE ER 10 MEQ PO TBCR
10.0000 meq | EXTENDED_RELEASE_TABLET | Freq: Two times a day (BID) | ORAL | Status: DC
  Administered 2013-08-27 – 2013-09-01 (×10): 10 meq via ORAL
  Filled 2013-08-27 (×11): qty 1

## 2013-08-27 MED ORDER — LORAZEPAM 1 MG PO TABS: 0.0000 mg | ORAL_TABLET | Freq: Two times a day (BID) | ORAL | Status: DC

## 2013-08-27 MED ORDER — ACETAMINOPHEN 500 MG PO TABS
1000.0000 mg | ORAL_TABLET | Freq: Once | ORAL | Status: AC
  Administered 2013-08-27: 1000 mg via ORAL
  Filled 2013-08-27: qty 2

## 2013-08-27 NOTE — ED Notes (Signed)
Pt reported having a seizure today while eating lunch. Mother was with Pt at time of the event. Mother reported to EMS that she helped son to floor ,so there was no fall to floor. On arrival to Ed Pt. A/O and speaking in full sentences. Pt now reports havind a HA on top of his head.10/10 pain for HA.

## 2013-08-27 NOTE — ED Notes (Signed)
PT reports he does not take any Meds for seizures.
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
## 2013-08-27 NOTE — ED Notes (Signed)
Returned from CT via stretcher

## 2013-08-27 NOTE — ED Notes (Signed)
Provider and Tech at bedside for LP

## 2013-08-27 NOTE — ED Notes (Signed)
Pt had stool on back and on bed linens.  Pt cleaned and linens changed. Pt states he has not been "drinking every day" lately. Sister remains at bedside
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
## 2013-08-28 DIAGNOSIS — G40909 Epilepsy, unspecified, not intractable, without status epilepticus: Secondary | ICD-10-CM | POA: Diagnosis present

## 2013-08-28 DIAGNOSIS — S52209A Unspecified fracture of shaft of unspecified ulna, initial encounter for closed fracture: Secondary | ICD-10-CM

## 2013-08-28 DIAGNOSIS — I2510 Atherosclerotic heart disease of native coronary artery without angina pectoris: Secondary | ICD-10-CM

## 2013-08-28 DIAGNOSIS — R569 Unspecified convulsions: Secondary | ICD-10-CM

## 2013-08-28 DIAGNOSIS — F1010 Alcohol abuse, uncomplicated: Secondary | ICD-10-CM

## 2013-08-28 DIAGNOSIS — F1911 Other psychoactive substance abuse, in remission: Secondary | ICD-10-CM

## 2013-08-28 DIAGNOSIS — B1920 Unspecified viral hepatitis C without hepatic coma: Secondary | ICD-10-CM

## 2013-08-28 DIAGNOSIS — B1910 Unspecified viral hepatitis B without hepatic coma: Secondary | ICD-10-CM

## 2013-08-28 DIAGNOSIS — I252 Old myocardial infarction: Secondary | ICD-10-CM

## 2013-08-28 LAB — HEPATIC FUNCTION PANEL
ALT: 36 U/L (ref 0–53)
AST: 48 U/L — ABNORMAL HIGH (ref 0–37)
Albumin: 3 g/dL — ABNORMAL LOW (ref 3.5–5.2)
Alkaline Phosphatase: 92 U/L (ref 39–117)
Bilirubin, Direct: 0.5 mg/dL — ABNORMAL HIGH (ref 0.0–0.3)
Indirect Bilirubin: 0.8 mg/dL (ref 0.3–0.9)
Total Bilirubin: 1.3 mg/dL — ABNORMAL HIGH (ref 0.3–1.2)
Total Protein: 5.7 g/dL — ABNORMAL LOW (ref 6.0–8.3)

## 2013-08-28 LAB — COMPREHENSIVE METABOLIC PANEL
ALT: 34 U/L (ref 0–53)
AST: 45 U/L — ABNORMAL HIGH (ref 0–37)
Albumin: 2.8 g/dL — ABNORMAL LOW (ref 3.5–5.2)
Alkaline Phosphatase: 90 U/L (ref 39–117)
BUN: 8 mg/dL (ref 6–23)
CO2: 20 mEq/L (ref 19–32)
Calcium: 8.4 mg/dL (ref 8.4–10.5)
Chloride: 103 mEq/L (ref 96–112)
Creatinine, Ser: 0.77 mg/dL (ref 0.50–1.35)
GFR calc Af Amer: >90 mL/min (ref >90)
GFR calc non Af Amer: >90 mL/min (ref >90)
Glucose, Bld: 90 mg/dL (ref 70–99)
Potassium: 3.4 mEq/L — ABNORMAL LOW (ref 3.7–5.3)
Sodium: 138 mEq/L (ref 137–147)
Total Bilirubin: 1.4 mg/dL — ABNORMAL HIGH (ref 0.3–1.2)
Total Protein: 5.6 g/dL — ABNORMAL LOW (ref 6.0–8.3)

## 2013-08-28 LAB — CREATININE, SERUM
Creatinine, Ser: 0.76 mg/dL (ref 0.50–1.35)
GFR calc Af Amer: >90 mL/min (ref >90)
GFR calc non Af Amer: >90 mL/min (ref >90)

## 2013-08-28 LAB — CBC
HCT: 34 % — ABNORMAL LOW (ref 39.0–52.0)
HCT: 35.4 % — ABNORMAL LOW (ref 39.0–52.0)
Hemoglobin: 11.7 g/dL — ABNORMAL LOW (ref 13.0–17.0)
Hemoglobin: 12.4 g/dL — ABNORMAL LOW (ref 13.0–17.0)
MCH: 32.1 pg (ref 26.0–34.0)
MCH: 32.7 pg (ref 26.0–34.0)
MCHC: 34.4 g/dL (ref 30.0–36.0)
MCHC: 35 g/dL (ref 30.0–36.0)
MCV: 93.4 fL (ref 78.0–100.0)
MCV: 93.4 fL (ref 78.0–100.0)
Platelets: 71 10*3/uL — ABNORMAL LOW (ref 150–400)
Platelets: 71 10*3/uL — ABNORMAL LOW (ref 150–400)
RBC: 3.64 MIL/uL — ABNORMAL LOW (ref 4.22–5.81)
RBC: 3.79 MIL/uL — ABNORMAL LOW (ref 4.22–5.81)
RDW: 14.7 % (ref 11.5–15.5)
RDW: 14.8 % (ref 11.5–15.5)
WBC: 7.2 10*3/uL (ref 4.0–10.5)
WBC: 7.2 10*3/uL (ref 4.0–10.5)

## 2013-08-28 LAB — MAGNESIUM: Magnesium: 1.7 mg/dL (ref 1.5–2.5)

## 2013-08-28 LAB — PROTIME-INR
INR: 1.34 (ref 0.00–1.49)
Prothrombin Time: 16.3 seconds — ABNORMAL HIGH (ref 11.6–15.2)

## 2013-08-28 LAB — PHOSPHORUS: Phosphorus: 2.3 mg/dL (ref 2.3–4.6)

## 2013-08-28 LAB — RPR: RPR Ser Ql: NONREACTIVE

## 2013-08-28 LAB — MRSA PCR SCREENING: MRSA by PCR: NEGATIVE

## 2013-08-28 LAB — HIV ANTIBODY (ROUTINE TESTING W REFLEX): HIV: NONREACTIVE

## 2013-08-28 LAB — AMMONIA: Ammonia: 80 umol/L — ABNORMAL HIGH (ref 11–60)

## 2013-08-28 LAB — VITAMIN B12: Vitamin B-12: 734 pg/mL (ref 211–911)

## 2013-08-28 NOTE — Progress Notes (Signed)
Family Medicine Teaching Service Daily Progress Note Intern Pager: [phone]  Patient name: [name] Medical record number: [id number] Date of birth: 1955/08/15 Age: 58 y.o. Gender: male  Primary Care Provider: No PCP Per Patient Consultants: None Code Status: full   Pt Overview and Major Events to Date:  08/27/13 - Palpitations c/w withdrawal from alcohol or benzos   Assessment and Plan: [name] is a 58 y.o. male presenting with reported seizure. PMH is significant for MI, COPD, HTN, Hepatitis B and C, alcohol/polysubstance abuse, frequent falls.   # Seizure:  Most likely causative of alcohol W/D seizures (48 hrs s/p last drink at time of seizure) & benzo w/d (xanax 6x per day, ran out on 3/11).  Can consider B12 deficiency (high normal MCV), ammonia, RPR or HIV etiology with contribution  - CSF and blood cultures pending, very low clinical suspicion of meningitis but if becomes febrile again would restart vanc/rocephin  - F/U  RPR, HIV, B12. - Ammonia elevated to 80, LFT's not grossly elevated.  Can consider lactulose trial to see if improvement in mentation  - Avoid Ultram for pain or Wellbutrin in setting of seizure  - May consider Keppra if appears to be other etiology than substance withdrawal   # Alcohol abuse, hx of substance abuse- Consider Wernicke's/Korsakoff (no acute concern w/o ophthalmoplegia or acute delirium), no confabulation  - CIWA protocol  - MV, Folate, Thiamine   # COPD  -Stable, continue home meds   # Hypertension: on coreg, lasix, spironolactone at home  - Continue home medications   # Hx of MI s/p stent x 2  -Continue home medications   # Fluid Overload - recent Echo (07/02/13) showing EF 50-55%, slightly reduced w/o diastolic dysfunction  - Continue Lasix, home dose   #Hx of Hepatitis B/C - No signs of cirrhosis or end stage liver disease on physical exam  - LFT's not grossly elevated  -Can consider abdominal US to evaluate for  cirrhosis/fluid overload pending lab work   #Isolated Thrombocytopenia - Could be many etiologies including alcohol, ITP, infectious (HCV/HIV) - Continue monitor CBC, seems chronic process w/ no acute  - Evaluate peripheral smear   FEN/GI: Advance diet as tolerates, NS @ 100 cc/hr  Prophylaxis: Heparin SQ   Disposition: Telemetry pending further evaluation   Subjective: Feeling well today, denies palpitations, HA, diaphoresis   Objective: Temp:  [98.4 F (36.9 C)-100.4 F (38 C)] 98.4 F (36.9 C) (03/15 0700) Pulse Rate:  [56-107] 84 (03/15 0509) Resp:  [17-26] 21 (03/15 0509) BP: (101-127)/(46-65) 106/56 mmHg (03/15 0509) SpO2:  [93 %-97 %] 93 % (03/15 0509) Weight:  [250 lb (113.399 kg)] 250 lb (113.399 kg) (03/14 1420)   Physical Exam: General: NAD, comfortable in bed, AAO x 3  HEENT: [place]/AT, mydriasis (8-9 mm B/L), no nystagmus, No scleral icterus  Cardiovascular: RRR, no murmurs appreciated  Respiratory: CTA B/L, no increased WOB, no accessory muscle use  Abdomen: Soft/NT/ND, NABS, no HSM  Extremities: L arm in full arm splint, +1 pitting edema B/L, + 2 distal pulses  Skin: No palmar erythema, no spider angiomas, no gynecomastia  Neuro: No focal deficits or asterixis    Laboratory:  Recent Labs Lab 08/27/13 1505 08/28/13 0014 08/28/13 0258  WBC 5.9 7.2 7.2  HGB 13.6 12.4* 11.7*  HCT 40.1 35.4* 34.0*  PLT 80* 71* 71*    Recent Labs Lab 08/27/13 1505 08/28/13 0014 08/28/13 0258  NA 141  --  138  K 4.1  --  3.4*  CL 102  --  103  CO2 18*  --  20  BUN 9  --  8  CREATININE 0.85 0.76 0.77  CALCIUM 9.2  --  8.4  PROT  --  5.7* 5.6*  BILITOT  --  1.3* 1.4*  ALKPHOS  --  92 90  ALT  --  36 34  AST  --  48* 45*  GLUCOSE 109*  --  90   Imaging/Diagnostic Tests: EKG - NSR, no acute infarct  CXR - No acute processes    Nolon Rod, DO 08/28/2013, 7:31 AM PGY-2, [place] Intern pager: [phone], text pages welcome

## 2013-08-28 NOTE — Progress Notes (Signed)
Utilization Review Completed.   Sharice Harriss, RN, BSN Nurse Case Manager  

## 2013-08-28 NOTE — H&P (Signed)
FMTS Attending Admission Note: Fifi Schindler MD [phone] pager office [phone] I  have seen and examined this patient, reviewed their chart. I have discussed this patient with the resident. I agree with the resident's findings, assessment and care plan. 

## 2013-08-28 NOTE — Progress Notes (Signed)
FMTS Attending Daily Note: Darren Mcmurray MD [phone] pager office [phone] I  have seen and examined this patient, reviewed their chart. I have discussed this patient with the resident. I agree with the resident's findings, assessment and care plan. From his history it seems pretty clear to me that his "seizure" witnessed by family is most likely related to  His combination of benzodiazepine and alcohol withdrawal. He has had one prior seizure in his life , he reports that was related to alcohol withdrawal. A Dr. Collie Siad (sp?) in [place] prescribes benzodiazepine for him for his "depression and anxiety" by his report. No signs of infectious etiology. Await final culture readings. Discussed alcohol program for him and he is not interested. Lives at home with his 11 yo mother and plans to return to that living situation upon d/c.

## 2013-08-29 ENCOUNTER — Encounter (HOSPITAL_COMMUNITY): Payer: Self-pay | Admitting: *Deleted

## 2013-08-29 DIAGNOSIS — J4489 Other specified chronic obstructive pulmonary disease: Secondary | ICD-10-CM

## 2013-08-29 DIAGNOSIS — I10 Essential (primary) hypertension: Secondary | ICD-10-CM

## 2013-08-29 DIAGNOSIS — J449 Chronic obstructive pulmonary disease, unspecified: Secondary | ICD-10-CM

## 2013-08-29 LAB — PATHOLOGIST SMEAR REVIEW

## 2013-08-29 MED ORDER — ENSURE COMPLETE PO LIQD
237.0000 mL | Freq: Two times a day (BID) | ORAL | Status: DC
  Administered 2013-08-29 – 2013-09-01 (×6): 237 mL via ORAL

## 2013-08-29 NOTE — Care Management Note (Unsigned)
    Page 1 of 1   09/01/2013     2:00:48 PM   CARE MANAGEMENT NOTE 09/01/2013  Patient:  ACY, ORSAK A   Account Number:  [id number]  Date Initiated:  08/29/2013  Documentation initiated by:  Marvetta Gibbons  Subjective/Objective Assessment:   Pt admitted with ETOH related seizures     Action/Plan:   PTA pt lived at home with mother- pt had [facility] at d/c in Jan. with [facility] for PT/OT and aide- spoke with [facility] and pt did meet goals with [facility] and was released from services. Pt has cane at home- PT eval ordered   Anticipated DC Date:  09/01/2013   Anticipated DC Plan:  [place]  In-house referral  Clinical Social Worker      DC Planning Services  CM consult      Choice offered to / List presented to:             [place].   Status of service:  Completed, signed off Medicare Important Message given?   (If response is "NO", the following Medicare IM given date fields will be blank) Date Medicare IM given:   Date Additional Medicare IM given:    Discharge Disposition:  [place]  Per UR Regulation:  Reviewed for med. necessity/level of care/duration of stay  If discussed at [place] of Stay Meetings, dates discussed:    Comments:  09/01/13 [place], BSN [phone] patient for MRI today, if ok will dc to snf to [facility]. Awaitng MRI results.

## 2013-08-29 NOTE — Evaluation (Signed)
Physical Therapy Evaluation Patient Details Name: [name] MRN: [id number] DOB: May 20, 1956 Today's Date: 08/29/2013 Time: 5093-2671 PT Time Calculation (min): 26 min  PT Assessment / Plan / Recommendation History of Present Illness  Admitted with Seizures, frequent falls  Clinical Impression  Pt admitted with above. Pt currently with functional limitations due to the deficits listed below (see PT Problem List).  Pt will benefit from skilled PT to increase their independence and safety with mobility to allow discharge to the venue listed below.     Pt was able to dc home with [facility] services after admission in January; according to Case Mgr note, pt did meet [facility] therapy goals, and was dc'd from [facility] therapy  Considering his functional performance on Eval, DC home is reasonable -- Still, noted pt's mother is concerned that she is unable to safely care for pt at home;   SNF is worth consideration -- would a rehabilitative intervention decr the likelihood to readmission, and address gait and balance deficits to maximize independence and safety with mobility prior to dc home?   PT Assessment  Patient needs continued PT services    Follow Up Recommendations  Supervision/Assistance - 24 hour If pt's mother can't safely supervise pt , must consider SNF    Does the patient have the potential to tolerate intense rehabilitation      Barriers to Discharge Decreased caregiver support Pt's mother is 42 years old and is quite worried about her ability to care for pt    Equipment Recommendations  Cane; Pt is to use a cane for steadiness whenever he is walking   Recommendations for Other Services OT consult   Frequency Min 3X/week    Precautions / Restrictions Precautions Precautions: Fall Required Braces or Orthoses: Other Brace/Splint Other Brace/Splint: L UE elbow splint Restrictions Weight Bearing Restrictions: Yes LUE Weight Bearing: Non weight bearing Other Position/Activity  Restrictions: PT will plan to proceed NWB unless otherwise ordered by Dr. Vanetta Shawl office   Pertinent Vitals/Pain no apparent distress       Mobility  Bed Mobility Overal bed mobility: Needs Assistance Bed Mobility: Supine to Sit Supine to sit: Supervision General bed mobility comments: Cues to initiate and supervision for safety, line untangle Transfers Overall transfer level: Needs assistance Equipment used: None Transfers: Sit to/from Stand Sit to Stand: Supervision General transfer comment: Noted general unsteadiness with pt bracing backs of LEs against bed for stability at initial stand Ambulation/Gait Ambulation/Gait assistance: Min assist Ambulation Distance (Feet): 200 Feet Assistive device: None;1 person hand held assist Gait Pattern/deviations: Decreased stride length;Narrow base of support Gait velocity: slowed General Gait Details: initiated amb without UE support or device with noted unsteadiness; Improved with unilateral handheld assist to approximate cane use    Exercises     PT Diagnosis: Difficulty walking;Abnormality of gait  PT Problem List: Decreased activity tolerance;Decreased balance;Decreased mobility;Decreased coordination;Decreased cognition;Decreased knowledge of use of DME;Decreased safety awareness PT Treatment Interventions: DME instruction;Gait training;Stair training;Functional mobility training;Therapeutic activities;Therapeutic exercise;Balance training;Neuromuscular re-education;Cognitive remediation;Patient/family education     PT Goals(Current goals can be found in the care plan section) Acute Rehab PT Goals Patient Stated Goal: wants to go home PT Goal Formulation: With patient Time For Goal Achievement: 09/05/13 Potential to Achieve Goals: Good  Visit Information  Last PT Received On: 08/29/13 Assistance Needed: +1 History of Present Illness: Admitted with Seizures, frequent falls       Prior [place] expects to be discharged to:: Private residence Living Arrangements: Parent Available  Help at Discharge: Available PRN/intermittently Type of Home: Mobile home Home Access: Stairs to enter Entrance Stairs-Number of Steps: 4-5 Entrance Stairs-Rails: Right;Left Home Layout: One level Home Equipment: Cane - single point Prior Function Level of Independence: Independent with assistive device(s) Comments: walks full-time with a cane Communication Communication: No difficulties Dominant Hand: Right    Cognition  Cognition Arousal/Alertness: Awake/alert Behavior During Therapy: WFL for tasks assessed/performed Overall Cognitive Status: Impaired/Different from baseline Area of Impairment: Memory General Comments: Pt did not remember this therapist from January admission (which is understandable); Still, he was unable to recall and report his HHPT visits, or his follow-up visits for his elbow at Dr. Vanetta Shawl office    Extremity/Trunk Assessment Upper Extremity Assessment Upper Extremity Assessment: Defer to OT evaluation (LUE in elbow immobilized splint) Lower Extremity Assessment Lower Extremity Assessment: Overall WFL for tasks assessed (for simple mobility tasks)   Balance Balance Overall balance assessment: History of Falls;Needs assistance Standing balance support: No upper extremity supported Standing balance-Leahy Scale: Fair Standing balance comment: Pt seems much more at ease with UE support Standardized Balance Assessment Standardized Balance Assessment :  (Consider Berg next session)  End of Session PT - End of Session Equipment Utilized During Treatment: Gait belt Activity Tolerance: Patient tolerated treatment well Patient left: in bed;with call bell/phone within reach Nurse Communication: Mobility status  GP     Roney Marion Hamff 08/29/2013, 4:18 PM Roney Marion, [place] Pager [phone] Office [phone]
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
## 2013-08-29 NOTE — Progress Notes (Signed)
Pt tx [facility]West per MD order, pt verbalized understanding of tx, pt VSS, report called to receiving RN, all questions answered

## 2013-08-29 NOTE — Progress Notes (Signed)
FMTS Attending Note  I personally saw and evaluated the patient. The plan of care was discussed with the resident team. I agree with the assessment and plan as documented by the resident.   1. Seizure/Falls due to alcohol use/withdrawl - no seizure activity since admission, continue CIWA, agree with plan for PT/OT evaluation with possible SNF placement.  2. Hypertension - controlled on current regimen however bradycardic, continue to monitor and consider decreasing dose of Coreg. 3. Other medical conditions stable as outlined in resident note.   Dossie Arbour MD
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
## 2013-08-30 LAB — GLUCOSE, CAPILLARY: Glucose-Capillary: 128 mg/dL — ABNORMAL HIGH (ref 70–99)

## 2013-08-30 MED ORDER — ACETAMINOPHEN 325 MG PO TABS
650.0000 mg | ORAL_TABLET | Freq: Four times a day (QID) | ORAL | Status: DC | PRN
  Administered 2013-08-30: 650 mg via ORAL
  Filled 2013-08-30: qty 2

## 2013-08-30 MED ORDER — CARVEDILOL 3.125 MG PO TABS
3.1250 mg | ORAL_TABLET | Freq: Two times a day (BID) | ORAL | Status: DC
  Administered 2013-08-30 – 2013-09-01 (×3): 3.125 mg via ORAL
  Filled 2013-08-30 (×6): qty 1

## 2013-08-30 NOTE — Progress Notes (Signed)
Family Medicine Teaching Service Daily Progress Note Intern Pager: [phone]  Patient name: [name] Medical record number: [id number] Date of birth: [birth year] Age: 58 y.o. Gender: male  Primary Care Provider: No PCP Per Patient Consultants: None Code Status: full   Pt Overview and Major Events to Date:  08/27/13- Palpitations c/w withdrawal from alcohol or benzos  08/28/13- Received 1mg  ativan for CIWA 5  Assessment and Plan: [name] is a 58 y.o. male presenting with reported seizure. PMH is significant for MI, COPD, HTN, Hepatitis B and C, alcohol/polysubstance abuse, frequent falls.   # Seizure:  Most likely causative of alcohol W/D seizures (96 hrs s/p last drink at time of seizure) & benzo w/d (xanax 6x per day, ran out on 3/11).  Additional workup including non-reactive HIV and RPR, normal B12 (734) - CSF and blood cultures pending (NGTD) - Ammonia elevated to 80, LFT's not grossly elevated.  Can consider lactulose trial to see if improvement in mentation  - May consider Keppra if appears to be other etiology than substance withdrawal - PT/OT eval for gait/balance/weakness.  - CSW for SNF placement: 64yo mother does not feel comfortable assisting with his care at home (nor would she be able to help if he were to have more falls). At this time the medical team, patient and family agree SNF would be best placement for him after discharge.  # Alcohol abuse, hx of substance abuse- Consider Wernicke's/Korsakoff (no acute concern w/o ophthalmoplegia or acute delirium), no confabulation. Total protein 5.7, albumin 3.0. - CIWA protocol (scores 0) - MV, Folate, Thiamine   # COPD  -Stable, continue home meds   # Hypertension: on coreg, lasix, spironolactone at home  - Continue home medications   # Hx of MI s/p stent x 2  -Continue home medications   # Fluid Overload - recent Echo (07/02/13) showing EF 50-55%, slightly reduced w/o diastolic dysfunction  - Continue Lasix,  home dose   # Hx of Hepatitis B/C - No signs of cirrhosis or end stage liver disease on physical exam  - LFT's not grossly elevated - Can consider abdominal US to evaluate for cirrhosis/fluid overload if needed  # Isolated Thrombocytopenia - Could be many etiologies including alcohol, ITP, infectious (HCV/HIV); appears near baseline of 70 from previous ED/hospitalization - Continue monitor CBC, seems chronic process w/ no acute  - Evaluate peripheral smear   FEN/GI: Advance diet as tolerates, NS @ 100 cc/hr  Prophylaxis: Heparin SQ   Disposition: pending SNF placement  Subjective:  No complaints this morning. He is agreeable to go to SNF.  Objective: Temp:  [98 F (36.7 C)-98.8 F (37.1 C)] 98.8 F (37.1 C) (03/17 0617) Pulse Rate:  [54-65] 65 (03/17 0617) Resp:  [16-18] 18 (03/17 0617) BP: (92-117)/(49-74) 104/66 mmHg (03/17 0617) SpO2:  [92 %-96 %] 92 % (03/17 0617)  Physical Exam: General: NAD, comfortable in bed HEENT: [place]/AT, PERRL, EOMI, no nystagmus, No scleral icterus  Cardiovascular: Bradycardic but regular rhythm, no murmurs appreciated  Respiratory: CTA B/L, no increased WOB, no accessory muscle use  Abdomen: Soft/NT/ND, NABS, no HSM  Extremities: L arm in full arm splint, +1 pitting edema B/L, + 2 PT pulses  Skin: No palmar erythema, no spider angiomas, no gynecomastia  Neuro: Alert and oriented x 4, no focal deficits. Gait is slow, unable to heel-to-toe walk. Negative Rhomberg's  Laboratory:  Recent Labs Lab 08/27/13 1505 08/28/13 0014 08/28/13 0258  WBC 5.9 7.2 7.2  HGB 13.6 12.4* 11.7*  HCT 40.1 35.4* 34.0*  PLT 80* 71* 71*    Recent Labs Lab 08/27/13 1505 08/28/13 0014 08/28/13 0258  NA 141  --  138  K 4.1  --  3.4*  CL 102  --  103  CO2 18*  --  20  BUN 9  --  8  CREATININE 0.85 0.76 0.77  CALCIUM 9.2  --  8.4  PROT  --  5.7* 5.6*  BILITOT  --  1.3* 1.4*  ALKPHOS  --  92 90  ALT  --  36 34  AST  --  48* 45*  GLUCOSE 109*  --  90    Imaging/Diagnostic Tests: EKG - NSR, no acute infarct  CXR - No acute processes  CT head - mild atrophy, no mass/hemorrhage/edema/acute appearing infarct  Tawanna Sat, MD 08/30/2013, 8:11 AM PGY-1, [place] Intern pager: [phone], text pages welcome
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
## 2013-08-30 NOTE — Progress Notes (Signed)
Interval progress note  Patient seen and examined by me after I was called by RN about unresponsiveness and absent left hand grip / left pupillary responsiveness.  When I saw patient a few minutes later, he stated he was feeling weak in left hand and remembers the entire last 30 minutes but did not feel like he had any seizure or other abnormal activity. He just suddenly started feeling left sided weakness in hand. Per nurse, his tele had baseline movement around time just prior to decreased hand grip.  O: BP 114/73  Pulse 81  Temp(Src) 98.7 F (37.1 C) (Oral)  Resp 18  Ht 5\' 8"  (1.727 m)  Wt 250 lb (113.399 kg)  BMI 38.02 kg/m2  SpO2 94% GEN: NAD NEURO: CN 2-12 tested and intact except diminished sensation to gross touch bilateral forehead and ?decreased left shoulder shrug though also possibly due to patient having difficulty following instruction. Left hand grip (4/5 decr compared to right 5/5. Bilateral LE 5/5 strength. PERRL Mildly slowed speech  A/P: DDx for reported decreased left hand grip strength and pupillary left sided unresponsiveness includes seizure with todd's paralysis vs TIA/stroke. - Called/consulted neurology for recs on EEG and/or MRI. Already had CT head on admission negative for acute changes.  Hilton Sinclair, MD

## 2013-08-30 NOTE — Progress Notes (Signed)
FMTS Attending Note  I personally saw and evaluated the patient. The plan of care was discussed with the resident team. I agree with the assessment and plan as documented by the resident.   Eve Rey MD 
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
## 2013-08-30 NOTE — Progress Notes (Signed)
Patient found verbally unresponsive for approximatly 40-50 seconds with eye movement, altered mental status, alert to self. Absent L hand grip, unresponsive L pupil. VS stable. Patients L grip returned and L pupil became responsive within approximatly 10 minutes. Patient stated had an unresolved headache.

## 2013-08-31 LAB — CSF CULTURE: Special Requests: NORMAL

## 2013-08-31 LAB — CSF CULTURE W GRAM STAIN: Culture: NO GROWTH

## 2013-08-31 MED ORDER — LORAZEPAM 1 MG PO TABS: 1.0000 mg | ORAL_TABLET | Freq: Four times a day (QID) | ORAL | Status: DC | PRN

## 2013-08-31 MED ORDER — LORAZEPAM 2 MG/ML IJ SOLN
1.0000 mg | Freq: Four times a day (QID) | INTRAMUSCULAR | Status: DC | PRN
Start: 2013-08-31 — End: 2013-09-01
  Administered 2013-08-31: 1 mg via INTRAVENOUS
  Filled 2013-08-31: qty 1

## 2013-08-31 MED ORDER — ALBUTEROL SULFATE (2.5 MG/3ML) 0.083% IN NEBU
INHALATION_SOLUTION | RESPIRATORY_TRACT | Status: AC
  Filled 2013-08-31: qty 3

## 2013-08-31 NOTE — Clinical Social Work Placement (Signed)
Clinical Social Work Department CLINICAL SOCIAL WORK PLACEMENT NOTE 08/31/2013  Patient:  TIMTOHY, BROSKI A  Account Number:  [id number] Admit date:  08/27/2013  Clinical Social Worker:  Kemper Durie, [place]  Date/time:  08/31/2013 03:00 PM  Clinical Social Work is seeking post-discharge placement for this patient at the following level of care:   [place]   (*CSW will update this form in Epic as items are completed)   08/31/2013  Patient/family provided with [facility] Department of Clinical Social Work's list of facilities offering this level of care within the geographic area requested by the patient (or if unable, by the patient's family).  08/31/2013  Patient/family informed of their freedom to choose among providers that offer the needed level of care, that participate in Medicare, Medicaid or managed care program needed by the patient, have an available bed and are willing to accept the patient.  08/31/2013  Patient/family informed of [facility]' ownership interest in [facility], as well as of the fact that they are under no obligation to receive care at this facility.  PASARR submitted to EDS on 08/31/2013 PASARR number received from EDS on 08/31/2013  FL2 transmitted to all facilities in geographic area requested by pt/family on  08/31/2013 FL2 transmitted to all facilities within larger geographic area on   Patient informed that his/her managed care company has contracts with or will negotiate with  certain facilities, including the following:     Patient/family informed of bed offers received:  08/31/2013 Patient chooses bed at [facility] Physician recommends and patient chooses bed at    Patient to be transferred to [facility] on   Patient to be transferred to facility by   The following physician request were entered in Epic:   Additional Comments:   Liz Beach, [place], [place], [phone]

## 2013-08-31 NOTE — Progress Notes (Signed)
Subjective: Continues to have left hand weakness.   Exam: Filed Vitals:   08/31/13 1416  BP: 119/74  Pulse: 56  Temp: 98.2 F (36.8 C)  Resp: 18   Gen: In bed, NAD MS: Awake, Alert, interactive GQ:BVQXI, EOMI Motor: Moves right side and left leg well. Left hand with difficulty with finger extension and interossei.  Sensory:intact to LT  Impression: 58 yo M with possible staring spell followed by left hand weakness. Possibilities include small focal seizure(though not definite and would not start AED based on history alone), left C8 radiculopathy. A small motor area stroke is a less likely possibility.   Recommendations: 1) MRI brain, C-spine 2) will continue to follow.  3) PT,OT.   Roland Rack, MD [facility] [phone]  If 7pm- 7am, please page neurology on call at [phone].

## 2013-08-31 NOTE — Progress Notes (Signed)
FMTS Attending Note  I personally saw and evaluated the patient. The plan of care was discussed with the resident team. I agree with the assessment and plan as documented by the resident.   Patient had suspected seizure yesterday, decreased mental status for approximately 45 minutes, has associated left arm weakness. Patient was evaluated by the FMTS at the time of the episode.   He is back to his baseline mental status today. PERRL, EOMI, CN 2-12 intact, strength 5/5 in upper extremities, sensation to light touch intact in upper extremities, pronator drift negative. Awaiting MRI brain as ordered by Neurology.   Dossie Arbour MD

## 2013-08-31 NOTE — Progress Notes (Signed)
Paged by nurse that patient was having seizure like activity. She noticed that he was not responding and moving his arms discordantly.  I went to evaluate the patient and nurse had given Ativan 1 mg x 1. She said that he was much improved and back to normal.  He was alert and oriented x 3.  He states that his last drink of alcohol was last Thursday. He averages 2 beers per day. He hasn't using any cocaine or heroin in the last month.   A/P: Withdrawal due to EtOH and benzo. Patient has been elevated for infection. Blood cx with no growth to date and CSF cx with no growth to date. Continue with CIWA protocal.   Rosemarie Ax, MD PGY-1, [facility] 08/31/2013, 4:27 PM FPTS Service pager: [phone] (text pages welcome through [facility])

## 2013-08-31 NOTE — Clinical Social Work Psychosocial (Signed)
Clinical Social Work Department BRIEF PSYCHOSOCIAL ASSESSMENT 08/31/2013  Patient:  [name], [name]     Account Number:  [id number]     Admit date:  08/27/2013  Clinical Social Worker:  Lovey Newcomer  Date/Time:  08/31/2013 11:30 AM  Referred by:  Physician  Date Referred:  08/31/2013 Referred for  SNF Placement   Other Referral:   Interview type:  Patient Other interview type:   Patient alert and oriented at time of assessment.    PSYCHOSOCIAL DATA Living Status:  PARENTS Admitted from facility:   Level of care:   Primary support name:  [name] Primary support relationship to patient:  PARENT Degree of support available:   Patient has limited support. Patient's mother is supportive of patient but is not able to care for him in his current condition.    CURRENT CONCERNS Current Concerns  Post-Acute Placement   Other Concerns:    SOCIAL WORK ASSESSMENT / PLAN CSW met with patient at bedside to complete assessment. Patient was resting comfortably upright in bed. CSW explained that PT has recommended HHPT but stated that SNF should be considered if his mother cannot help him at home. Patient states that he wants to go to SNF because his mother cannot help him at home. CSW explained SNF search/placement process. Patient's questions fully answered. Patient is indifferent about SNF placement and states that he doesn't care where he goes.   Assessment/plan status:  Psychosocial Support/Ongoing Assessment of Needs Other assessment/ plan:   COmplete FL2, Fax, PASRR   Information/referral to community resources:   CSW contact information and SNF list given to patient.    PATIENT'S/FAMILY'S RESPONSE TO PLAN OF CARE: Patient states that he is agreeable to SNF placement in [place]. Patient was pleasant, appropriate, and appreciative of CSW contact. CSW will follow up with bed offers.       Liz Beach, [place], [place], [phone]

## 2013-08-31 NOTE — Progress Notes (Signed)
Family Medicine Teaching Service Daily Progress Note Intern Pager: [phone]  Patient name: [name] Medical record number: [id number] Date of birth: 01-24-1956 Age: 58 y.o. Gender: male  Primary Care Provider: No PCP Per Patient Consultants: None Code Status: full   Pt Overview and Major Events to Date:  3/14- Palpitations c/w withdrawal from alcohol or benzos  3/15- Received 1mg  ativan for CIWA 5 3/17- Left arm weakness, MRI and neuro ordered  Assessment and Plan: [name] is a 58 y.o. male presenting with reported seizure. PMH is significant for MI, COPD, HTN, Hepatitis B and C, alcohol/polysubstance abuse, frequent falls.   # Seizure:  Most likely causative of alcohol W/D seizures (96 hrs s/p last drink at time of seizure) & benzo w/d (xanax 6x per day, ran out on 3/11).  Additional workup including non-reactive HIV and RPR, normal B12 (734) - CSF and blood cultures pending (NGTD) - Ammonia elevated to 80, LFT's not grossly elevated.  Can consider lactulose trial to see if improvement in mentation  - May consider Keppra if appears to be other etiology than substance withdrawal - PT/OT eval for gait/balance/weakness. PT now recommending SNF - CSW for SNF placement: 90yo mother does not feel comfortable assisting with his care at home (nor would she be able to help if he were to have more falls). At this time the medical team, patient and family agree SNF would be best placement for him after discharge. - had suspected seizure yesterday, evaluated by neurology and recommended MRI but not EEG.  # Alcohol abuse, hx of substance abuse- Consider Wernicke's/Korsakoff (no acute concern w/o ophthalmoplegia or acute delirium), no confabulation. Total protein 5.7, albumin 3.0. - CIWA protocol (scores 0) - MV, Folate, Thiamine   # COPD  -Stable, continue home meds   # Hypertension: on coreg, lasix, spironolactone at home  - Continue home medications   # Hx of MI s/p stent x 2   -Continue home medications   # Fluid Overload - recent Echo (07/02/13) showing EF 50-55%, slightly reduced w/o diastolic dysfunction  - Continue Lasix, home dose   # Hx of Hepatitis B/C - No signs of cirrhosis or end stage liver disease on physical exam  - LFT's not grossly elevated - Can consider abdominal US to evaluate for cirrhosis/fluid overload if needed  # Isolated Thrombocytopenia - Could be many etiologies including alcohol, ITP, infectious (HCV/HIV); appears near baseline of 70 from previous ED/hospitalization - Continue monitor CBC, seems chronic process w/ no acute  - Evaluate peripheral smear = negative for other pathology  FEN/GI: saline lock, diet heart Prophylaxis: Heparin SQ   Disposition: pending SNF placement  Subjective:  Has no complaints this morning, denies pain. Says he is still willing to go to SNF  Objective: Temp:  [97.4 F (36.3 C)-98.7 F (37.1 C)] 97.9 F (36.6 C) (03/18 0612) Pulse Rate:  [52-81] 61 (03/18 0612) Resp:  [18] 18 (03/18 0612) BP: (92-135)/(53-76) 110/69 mmHg (03/18 0612) SpO2:  [93 %-99 %] 95 % (03/18 0612)  Physical Exam: General: NAD, comfortable in bed HEENT: [place]/AT, PERRL (mildly dilated), EOMI, no nystagmus, No scleral icterus  Cardiovascular: Bradycardic but regular rhythm, no murmurs appreciated  Respiratory: CTA B/L, no increased WOB, no accessory muscle use  Abdomen: Soft/NT/ND, NABS, no HSM  Extremities: L arm in full arm splint, +1 pitting edema B/L, + 2 PT pulses  Skin: No palmar erythema, no spider angiomas, no gynecomastia  Neuro: Alert and oriented x 4. CN grossly normal.  Strength 5/5 for grip strength bilaterally, hip flexion 4/5 bilaterally, ankle extension and flexion 5/5 bilaterally.  Laboratory:  Recent Labs Lab 08/27/13 1505 08/28/13 0014 08/28/13 0258  WBC 5.9 7.2 7.2  HGB 13.6 12.4* 11.7*  HCT 40.1 35.4* 34.0*  PLT 80* 71* 71*    Recent Labs Lab 08/27/13 1505 08/28/13 0014 08/28/13 0258  NA  141  --  138  K 4.1  --  3.4*  CL 102  --  103  CO2 18*  --  20  BUN 9  --  8  CREATININE 0.85 0.76 0.77  CALCIUM 9.2  --  8.4  PROT  --  5.7* 5.6*  BILITOT  --  1.3* 1.4*  ALKPHOS  --  92 90  ALT  --  36 34  AST  --  48* 45*  GLUCOSE 109*  --  90   Imaging/Diagnostic Tests: EKG - NSR, no acute infarct  CXR - No acute processes  CT head - mild atrophy, no mass/hemorrhage/edema/acute appearing infarct  Tawanna Sat, MD 08/31/2013, 8:10 AM PGY-1, [place] Intern pager: [phone], text pages welcome

## 2013-08-31 NOTE — Significant Event (Signed)
Rapid Response Event Note  Overview: Time Called: 1532 Arrival Time: 1534 Event Type: Other (Comment)  Initial Focused Assessment:  Called by primary RN for patient not responding to RN.  Upon my arrival to bedside, RN in room.  Patient lying in bed, anxious, diaphoretic, c/o headach, restless.  Patient hallucinating.     Interventions:   PIV 20 gauge inserted in Right AC on ist attempt. Patient tolerated well.  Recommended to give ativan for withdrawal aymptoms.  RN paged md and updated En to call if assistance needed   Event Summary:   at      at          Kennedy Bucker

## 2013-08-31 NOTE — Progress Notes (Signed)
Physical Therapy Treatment Patient Details Name: [name] MRN: [id number] DOB: 01/24/1956 Today's Date: 08/31/2013 Time: 4332-9518 PT Time Calculation (min): 24 min  PT Assessment / Plan / Recommendation  History of Present Illness Admitted with Seizures, frequent falls   PT Comments   Pt presents with flat affect throughout session today. Pt demo decreased safety awareness and balance deficits with mobility. Pt kept Lt digits flexed throughout session. Uncertain if Lt sided weakness vs decreased ability to follow commands/ decr motor planning. D/C disposition updated. MD present at end of session and notified of new symptoms.   Follow Up Recommendations  SNF     Does the patient have the potential to tolerate intense rehabilitation     Barriers to Discharge        Equipment Recommendations  Cane    Recommendations for Other Services OT consult  Frequency Min 3X/week   Progress towards PT Goals Progress towards PT goals: Progressing toward goals  Plan Discharge plan needs to be updated    Precautions / Restrictions Precautions Precautions: Fall Required Braces or Orthoses: Other Brace/Splint Other Brace/Splint: L UE elbow splint Restrictions Weight Bearing Restrictions: Yes LUE Weight Bearing: Non weight bearing Other Position/Activity Restrictions: PT will plan to proceed NWB unless otherwise ordered by Dr. Vanetta Shawl office   Pertinent Vitals/Pain No complaints of pain.    Mobility  Bed Mobility Overal bed mobility: Needs Assistance Bed Mobility: Supine to Sit;Sit to Supine Supine to sit: Supervision;HOB elevated Sit to supine: Supervision General bed mobility comments: pt requires incr time to sequence and relies heavily on handrails; supervision for cues for sequencing; pt tends to keep Lt digits flexed  Transfers Overall transfer level: Needs assistance Equipment used: Straight cane Transfers: Sit to/from Stand Sit to Stand: Min guard General transfer  comment: pt with difficulty sequencing and managing SPC with transfers; max cues for safety; min guard to steady; pt very slow pace Ambulation/Gait Ambulation/Gait assistance: Min assist Ambulation Distance (Feet): 170 Feet Assistive device: Straight cane Gait Pattern/deviations: Staggering left;Staggering right;Shuffle;Decreased stride length;Narrow base of support Gait velocity: slowed Gait velocity interpretation: Below normal speed for age/gender General Gait Details: pt with difficulty sequencing steps with SPC; cues for sequencing and safety ; (A) throughout gt to maintain balance; pt seemed unaware that he would stagger and become off balance; pt keeping Lt digits flexed at all times          PT Diagnosis:    PT Problem List:   PT Treatment Interventions:     PT Goals (current goals can now be found in the care plan section) Acute Rehab PT Goals Patient Stated Goal: to go home PT Goal Formulation: With patient Time For Goal Achievement: 09/05/13 Potential to Achieve Goals: Good  Visit Information  Last PT Received On: 08/31/13 Assistance Needed: +1 History of Present Illness: Admitted with Seizures, frequent falls    Subjective Data  Subjective: Pt lying supine; "i guess we can walk if you want"  Patient Stated Goal: to go home   Cognition  Cognition Arousal/Alertness: Awake/alert Behavior During Therapy: Flat affect Overall Cognitive Status: No family/caregiver present to determine baseline cognitive functioning Area of Impairment: Orientation;Attention;Following commands;Safety/judgement;Problem solving Orientation Level: Disoriented to;Time Current Attention Level: Sustained Following Commands: Follows one step commands inconsistently Safety/Judgement: Decreased awareness of deficits;Decreased awareness of safety Problem Solving: Slow processing;Decreased initiation;Difficulty sequencing;Requires verbal cues;Requires tactile cues General Comments: pt with very  flat affect and difficulty with motor planning/sequencing    Balance  Balance Overall balance assessment: Needs  assistance;History of Falls Sitting-balance support: Feet supported;No upper extremity supported Sitting balance-Leahy Scale: Normal Standing balance support: During functional activity;Single extremity supported Standing balance-Leahy Scale: Poor Standing balance comment: pt very unsteady; demo decreased weightshift to Lt  General Comments General comments (skin integrity, edema, etc.): pt with Lt digits flexed and demo decr Lt grip strength; decreased LAQ in sitting; pt with difficulty motor planning vs weakness in Lt side ?   End of Session PT - End of Session Equipment Utilized During Treatment: Gait belt Activity Tolerance: Patient tolerated treatment well Patient left: in bed;with call bell/phone within reach Nurse Communication: Mobility status   GP     Gustavus Bryant, [place] 08/31/2013, 9:51 AM

## 2013-08-31 NOTE — Progress Notes (Signed)
Entered room patient agitated, anxious, diaphoretic and not responding to commands. Rapid response nurse called and patient given ativan for DT's. MD notified.

## 2013-09-01 ENCOUNTER — Inpatient Hospital Stay (HOSPITAL_COMMUNITY): Payer: PRIVATE HEALTH INSURANCE

## 2013-09-01 MED ORDER — CARVEDILOL 3.125 MG PO TABS: 3.1250 mg | ORAL_TABLET | Freq: Two times a day (BID) | ORAL | Status: DC

## 2013-09-01 MED ORDER — LORAZEPAM 1 MG PO TABS: 0.5000 mg | ORAL_TABLET | Freq: Four times a day (QID) | ORAL | Status: DC | PRN

## 2013-09-01 MED ORDER — FOLIC ACID 1 MG PO TABS: 1.0000 mg | ORAL_TABLET | Freq: Every day | ORAL | Status: DC

## 2013-09-01 MED ORDER — THIAMINE HCL 100 MG PO TABS: 100.0000 mg | ORAL_TABLET | Freq: Every day | ORAL | Status: DC

## 2013-09-01 NOTE — Progress Notes (Signed)
Family Medicine Teaching Service Daily Progress Note Intern Pager: [phone]  Patient name: [name] Medical record number: [id number] Date of birth: May 04, 1956 Age: 58 y.o. Gender: male  Primary Care Provider: No PCP Per Patient Consultants: None Code Status: full   Pt Overview and Major Events to Date:  3/14- Palpitations c/w withdrawal from alcohol or benzos  3/15- Received 1mg  ativan for CIWA 5 3/17- Left arm weakness, MRI and neuro ordered 3/18- Received ativan 1mg  x1 for withdrawal symptoms  Assessment and Plan: [name] is a 58 y.o. male presenting with reported seizure. PMH is significant for MI, COPD, HTN, Hepatitis B and C, alcohol/polysubstance abuse, frequent falls.   # Seizure:  Most likely causative of alcohol W/D seizures (96 hrs s/p last drink at time of seizure) & benzo w/d (xanax 6x per day, ran out on 3/11).  Additional workup including non-reactive HIV and RPR, normal B12 (734) - CSF and blood cultures pending (NGTD) - Ammonia elevated to 80, LFT's not grossly elevated.  Can consider lactulose trial to see if improvement in mentation  - PT/OT eval for gait/balance/weakness. PT recommending SNF - CSW for SNF placement: 68yo mother does not feel comfortable assisting with his care at home (nor would she be able to help if he were to have more falls). At this time the medical team, patient and family agree SNF would be best placement for him after discharge. - had suspected seizure 3/17, evaluated by neurology and recommended MRI but not EEG.  # Alcohol abuse, hx of substance abuse- Consider Wernicke's/Korsakoff (no acute concern w/o ophthalmoplegia or acute delirium), no confabulation. Total protein 5.7, albumin 3.0. - CIWA protocol: yesterday 2, 24 (received ativan 1mg ), 1, 1 this morning. He does not recall any symptoms of withdrawal from yesterday - MV, Folate, Thiamine   # COPD  -Stable, continue home meds   # Hypertension: on coreg, lasix,  spironolactone at home  - Continue home medications   # Hx of MI s/p stent x 2  -Continue home medications   # Fluid Overload - recent Echo (07/02/13) showing EF 50-55%, slightly reduced w/o diastolic dysfunction  - Continue Lasix, home dose   # Hx of Hepatitis B/C - No signs of cirrhosis or end stage liver disease on physical exam  - LFT's not grossly elevated - Can consider abdominal US to evaluate for cirrhosis/fluid overload if needed  # Isolated Thrombocytopenia - Could be many etiologies including alcohol, ITP, infectious (HCV/HIV); appears near baseline of 70 from previous ED/hospitalization - Continue monitor CBC, seems chronic process w/ no acute  - Evaluate peripheral smear = negative for other pathology  FEN/GI: saline lock, diet heart Prophylaxis: Heparin SQ   Disposition: pending SNF placement  Subjective:  Yesterday afternoon had CIWA score 24 with seizure like activity, nurse gave 1mg  ativan and by the time MD arrived was back to baseline, A+Ox3 (see separate progress note by Dr. Raeford Razor).   No complaints this morning, denies pain, fevers, chills, sweating, tremors, hallucinations.   Objective: Temp:  [98.2 F (36.8 C)-98.7 F (37.1 C)] 98.7 F (37.1 C) (03/19 0500) Pulse Rate:  [56-98] 59 (03/19 0500) Resp:  [18-20] 18 (03/19 0500) BP: (102-180)/(63-91) 126/79 mmHg (03/19 0500) SpO2:  [94 %-96 %] 95 % (03/19 0500)  Physical Exam: General: NAD, sitting on side of bed eating breakfast HEENT: [place]/AT, PERRL (mildly dilated), EOMI, no nystagmus, No scleral icterus  Cardiovascular: Bradycardic but regular rhythm, no murmurs appreciated  Respiratory: CTA B/L, no increased WOB,  no accessory muscle use  Abdomen: Soft/NT/ND, NABS, no HSM  Extremities: trace edema, no cyanosis Neuro: Alert and oriented x 4. CN grossly normal. Strength 5/5 for grip strength bilaterally, ankle extension 5/5 bilaterally.  Laboratory:  Recent Labs Lab 08/27/13 1505 08/28/13 0014  08/28/13 0258  WBC 5.9 7.2 7.2  HGB 13.6 12.4* 11.7*  HCT 40.1 35.4* 34.0*  PLT 80* 71* 71*    Recent Labs Lab 08/27/13 1505 08/28/13 0014 08/28/13 0258  NA 141  --  138  K 4.1  --  3.4*  CL 102  --  103  CO2 18*  --  20  BUN 9  --  8  CREATININE 0.85 0.76 0.77  CALCIUM 9.2  --  8.4  PROT  --  5.7* 5.6*  BILITOT  --  1.3* 1.4*  ALKPHOS  --  92 90  ALT  --  36 34  AST  --  48* 45*  GLUCOSE 109*  --  90   Imaging/Diagnostic Tests: EKG - NSR, no acute infarct  CXR - No acute processes  CT head - mild atrophy, no mass/hemorrhage/edema/acute appearing infarct  Tawanna Sat, MD 09/01/2013, 8:17 AM PGY-1, [place] Intern pager: [phone], text pages welcome

## 2013-09-01 NOTE — Progress Notes (Signed)
Report called to [facility]. Pt stable upon d/c via ambulance. PIV removed and intact. Pt with no further questions upon discharge.

## 2013-09-01 NOTE — Discharge Summary (Signed)
I agree with the discharge summary as documented.   Kyle Fletke MD  

## 2013-09-01 NOTE — Progress Notes (Signed)
FMTS Attending Note  I personally saw and evaluated the patient. The plan of care was discussed with the resident team. I agree with the assessment and plan as documented by the resident.   Patient had another suspected seizure yesterday around 4 PM, back to baseline, no events overnight, suspect seizure activity due to alcohol/benzodiazepine withdrawal, has not required any additional ativan today, agree with multivitamin, folate, and thiamine. MRI brain negative for acute pathology. Consider EEG if seizure activity persists. Patient will need to be continued on as needed ativan at time of discharge.   Discharge planning - patient planning to go to SN, will wait until patient has been asymptomatic/seizure free for 24 hours prior to discharge. Anticipate discharge later today.  Dossie Arbour MD

## 2013-09-01 NOTE — Clinical Social Work Placement (Signed)
Clinical Social Work Department CLINICAL SOCIAL WORK PLACEMENT NOTE 09/01/2013  Patient:  [name], [name]  Account Number:  [id number] Admit date:  08/27/2013  Clinical Social Worker:  Kemper Durie, [place]  Date/time:  08/31/2013 03:00 PM  Clinical Social Work is seeking post-discharge placement for this patient at the following level of care:   [place]   (*CSW will update this form in Epic as items are completed)   08/31/2013  Patient/family provided with [place] Department of Clinical Social Work's list of facilities offering this level of care within the geographic area requested by the patient (or if unable, by the patient's family).  08/31/2013  Patient/family informed of their freedom to choose among providers that offer the needed level of care, that participate in Medicare, Medicaid or managed care program needed by the patient, have an available bed and are willing to accept the patient.  08/31/2013  Patient/family informed of [facility]' ownership interest in [facility], as well as of the fact that they are under no obligation to receive care at this facility.  PASARR submitted to EDS on 08/31/2013 PASARR number received from EDS on 08/31/2013  FL2 transmitted to all facilities in geographic area requested by pt/family on  08/31/2013 FL2 transmitted to all facilities within larger geographic area on   Patient informed that his/her managed care company has contracts with or will negotiate with  certain facilities, including the following:     Patient/family informed of bed offers received:  08/31/2013 Patient chooses bed at [facility] Physician recommends and patient chooses bed at    Patient to be transferred to [facility] on  09/01/2013 Patient to be transferred to facility by Ambulance  The following physician request were entered in Epic:   Additional Comments: Per MD patient ready to [facility].  RN, patient, patient's mother Nelie, and facility aware of DC. DC Summary sent to admission coordinator. RN will request ambulance transport for patient by calling [phone] (option 1, option 3). DC packet with number for report ([phone]) on chart. CSW confirmed with RN liaison and admissions coordinator that CIWA score of 2 is acceptable and that they can admit him today. CSW signing off at this time.   Liz Beach, [place], [place], [phone]

## 2013-09-02 LAB — CULTURE, BLOOD (ROUTINE X 2)
Culture: NO GROWTH
Culture: NO GROWTH

## 2013-09-02 NOTE — Discharge Summary (Addendum)
I agree with the discharge summary as documented.  I became aware of the abnormal MRI c-spine after the patient had been discharged. The resident discussed epidural hematoma found on MRI c-spine with neurology who felt that this was likely incidental and not contributing to the patients symptoms. Will have the resident team call the SNF that the patient was discharged to and schedule follow up with neurology.   Dossie Arbour MD

## 2014-01-15 ENCOUNTER — Emergency Department (HOSPITAL_COMMUNITY)
Admission: EM | Admit: 2014-01-15 | Discharge: 2014-01-15 | Disposition: A | Discharge status: Home / Self Care | Payer: PRIVATE HEALTH INSURANCE | Attending: Emergency Medicine | Admitting: Emergency Medicine

## 2014-01-15 ENCOUNTER — Emergency Department (HOSPITAL_COMMUNITY): Payer: PRIVATE HEALTH INSURANCE

## 2014-01-15 ENCOUNTER — Encounter (HOSPITAL_COMMUNITY): Payer: Self-pay | Admitting: Emergency Medicine

## 2014-01-15 DIAGNOSIS — G40909 Epilepsy, unspecified, not intractable, without status epilepticus: Secondary | ICD-10-CM | POA: Diagnosis not present

## 2014-01-15 DIAGNOSIS — Z8619 Personal history of other infectious and parasitic diseases: Secondary | ICD-10-CM | POA: Diagnosis not present

## 2014-01-15 DIAGNOSIS — Z7982 Long term (current) use of aspirin: Secondary | ICD-10-CM | POA: Insufficient documentation

## 2014-01-15 DIAGNOSIS — F17200 Nicotine dependence, unspecified, uncomplicated: Secondary | ICD-10-CM | POA: Diagnosis not present

## 2014-01-15 DIAGNOSIS — Z79899 Other long term (current) drug therapy: Secondary | ICD-10-CM | POA: Insufficient documentation

## 2014-01-15 DIAGNOSIS — Z8781 Personal history of (healed) traumatic fracture: Secondary | ICD-10-CM | POA: Insufficient documentation

## 2014-01-15 DIAGNOSIS — R079 Chest pain, unspecified: Secondary | ICD-10-CM | POA: Diagnosis present

## 2014-01-15 DIAGNOSIS — Z8679 Personal history of other diseases of the circulatory system: Secondary | ICD-10-CM | POA: Insufficient documentation

## 2014-01-15 DIAGNOSIS — M129 Arthropathy, unspecified: Secondary | ICD-10-CM | POA: Diagnosis not present

## 2014-01-15 LAB — COMPREHENSIVE METABOLIC PANEL
ALT: 55 U/L — ABNORMAL HIGH (ref 0–53)
AST: 63 U/L — ABNORMAL HIGH (ref 0–37)
Albumin: 3.9 g/dL (ref 3.5–5.2)
Alkaline Phosphatase: 103 U/L (ref 39–117)
Anion gap: 18 — ABNORMAL HIGH (ref 5–15)
BUN: 9 mg/dL (ref 6–23)
CO2: 20 mEq/L (ref 19–32)
Calcium: 9.1 mg/dL (ref 8.4–10.5)
Chloride: 103 mEq/L (ref 96–112)
Creatinine, Ser: 0.9 mg/dL (ref 0.50–1.35)
GFR calc Af Amer: >90 mL/min (ref >90)
GFR calc non Af Amer: >90 mL/min (ref >90)
Glucose, Bld: 95 mg/dL (ref 70–99)
Potassium: 3.4 mEq/L — ABNORMAL LOW (ref 3.7–5.3)
Sodium: 141 mEq/L (ref 137–147)
Total Bilirubin: 1 mg/dL (ref 0.3–1.2)
Total Protein: 6.8 g/dL (ref 6.0–8.3)

## 2014-01-15 LAB — CBC WITH DIFFERENTIAL/PLATELET
Basophils Absolute: 0 10*3/uL (ref 0.0–0.1)
Basophils Relative: 0 % (ref 0–1)
Eosinophils Absolute: 0.2 10*3/uL (ref 0.0–0.7)
Eosinophils Relative: 3 % (ref 0–5)
HCT: 38.8 % — ABNORMAL LOW (ref 39.0–52.0)
Hemoglobin: 13.4 g/dL (ref 13.0–17.0)
Lymphocytes Relative: 24 % (ref 12–46)
Lymphs Abs: 1.7 10*3/uL (ref 0.7–4.0)
MCH: 30.9 pg (ref 26.0–34.0)
MCHC: 34.5 g/dL (ref 30.0–36.0)
MCV: 89.4 fL (ref 78.0–100.0)
Monocytes Absolute: 0.8 10*3/uL (ref 0.1–1.0)
Monocytes Relative: 11 % (ref 3–12)
Neutro Abs: 4.5 10*3/uL (ref 1.7–7.7)
Neutrophils Relative %: 62 % (ref 43–77)
Platelets: 58 10*3/uL — ABNORMAL LOW (ref 150–400)
RBC: 4.34 MIL/uL (ref 4.22–5.81)
RDW: 14.9 % (ref 11.5–15.5)
WBC: 7.2 10*3/uL (ref 4.0–10.5)

## 2014-01-15 LAB — I-STAT TROPONIN, ED: Troponin i, poc: 0 ng/mL (ref 0.00–0.08)

## 2014-01-15 LAB — LIPASE, BLOOD: Lipase: 19 U/L (ref 11–59)

## 2014-01-15 MED ORDER — HYDROMORPHONE HCL PF 1 MG/ML IJ SOLN
1.0000 mg | Freq: Once | INTRAMUSCULAR | Status: AC
  Administered 2014-01-15: 1 mg via INTRAVENOUS
  Filled 2014-01-15: qty 1

## 2014-01-15 MED ORDER — GI COCKTAIL ~~LOC~~
30.0000 mL | Freq: Once | ORAL | Status: AC
  Administered 2014-01-15: 30 mL via ORAL
  Filled 2014-01-15: qty 30

## 2014-01-15 MED ORDER — ONDANSETRON HCL 4 MG/2ML IJ SOLN
4.0000 mg | Freq: Once | INTRAMUSCULAR | Status: AC
  Administered 2014-01-15: 4 mg via INTRAVENOUS
  Filled 2014-01-15: qty 2
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
## 2014-01-15 NOTE — ED Notes (Signed)
Per EMS, Pt. Reports centralized sharp chest pain x3 hours. States has had some stressful personal events recently and thinks that's what causing it. Took 2 nitro prior to EMS arrival, and given a third nitro by EMS with 324 ASA. Did not help with pain.

## 2014-01-15 NOTE — ED Notes (Signed)
Patient told ed provider that he was going AMA md aware

## 2014-01-15 NOTE — ED Notes (Signed)
Pt. Requesting to leave, pulled out IV. States will stay until all the workup is back.

## 2014-01-15 NOTE — ED Notes (Signed)
Neoma Laming (sister): [phone]. To be called on discharge to give patient a ride.
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
## 2014-04-20 ENCOUNTER — Emergency Department (HOSPITAL_COMMUNITY): Payer: PRIVATE HEALTH INSURANCE

## 2014-04-20 ENCOUNTER — Emergency Department (HOSPITAL_COMMUNITY)
Admission: EM | Admit: 2014-04-20 | Discharge: 2014-04-20 | Disposition: A | Discharge status: Home / Self Care | Payer: PRIVATE HEALTH INSURANCE | Attending: Emergency Medicine | Admitting: Emergency Medicine

## 2014-04-20 DIAGNOSIS — Z7982 Long term (current) use of aspirin: Secondary | ICD-10-CM | POA: Diagnosis not present

## 2014-04-20 DIAGNOSIS — W19XXXA Unspecified fall, initial encounter: Secondary | ICD-10-CM

## 2014-04-20 DIAGNOSIS — G40909 Epilepsy, unspecified, not intractable, without status epilepticus: Secondary | ICD-10-CM | POA: Diagnosis not present

## 2014-04-20 DIAGNOSIS — Y92009 Unspecified place in unspecified non-institutional (private) residence as the place of occurrence of the external cause: Secondary | ICD-10-CM | POA: Insufficient documentation

## 2014-04-20 DIAGNOSIS — J439 Emphysema, unspecified: Secondary | ICD-10-CM | POA: Diagnosis not present

## 2014-04-20 DIAGNOSIS — G8929 Other chronic pain: Secondary | ICD-10-CM | POA: Diagnosis not present

## 2014-04-20 DIAGNOSIS — Z79899 Other long term (current) drug therapy: Secondary | ICD-10-CM | POA: Diagnosis not present

## 2014-04-20 DIAGNOSIS — W01198A Fall on same level from slipping, tripping and stumbling with subsequent striking against other object, initial encounter: Secondary | ICD-10-CM | POA: Insufficient documentation

## 2014-04-20 DIAGNOSIS — Z8619 Personal history of other infectious and parasitic diseases: Secondary | ICD-10-CM | POA: Insufficient documentation

## 2014-04-20 DIAGNOSIS — I252 Old myocardial infarction: Secondary | ICD-10-CM | POA: Diagnosis not present

## 2014-04-20 DIAGNOSIS — S3992XA Unspecified injury of lower back, initial encounter: Secondary | ICD-10-CM | POA: Insufficient documentation

## 2014-04-20 DIAGNOSIS — R55 Syncope and collapse: Secondary | ICD-10-CM | POA: Diagnosis present

## 2014-04-20 DIAGNOSIS — Y9389 Activity, other specified: Secondary | ICD-10-CM | POA: Diagnosis not present

## 2014-04-20 DIAGNOSIS — M545 Low back pain, unspecified: Secondary | ICD-10-CM

## 2014-04-20 DIAGNOSIS — R062 Wheezing: Secondary | ICD-10-CM

## 2014-04-20 DIAGNOSIS — M1990 Unspecified osteoarthritis, unspecified site: Secondary | ICD-10-CM | POA: Insufficient documentation

## 2014-04-20 DIAGNOSIS — Z720 Tobacco use: Secondary | ICD-10-CM | POA: Diagnosis not present

## 2014-04-20 LAB — CBC WITH DIFFERENTIAL/PLATELET
Basophils Absolute: 0 10*3/uL (ref 0.0–0.1)
Basophils Relative: 0 % (ref 0–1)
Eosinophils Absolute: 0.1 10*3/uL (ref 0.0–0.7)
Eosinophils Relative: 3 % (ref 0–5)
HCT: 38 % — ABNORMAL LOW (ref 39.0–52.0)
Hemoglobin: 13 g/dL (ref 13.0–17.0)
Lymphocytes Relative: 41 % (ref 12–46)
Lymphs Abs: 1.3 10*3/uL (ref 0.7–4.0)
MCH: 30.9 pg (ref 26.0–34.0)
MCHC: 34.2 g/dL (ref 30.0–36.0)
MCV: 90.3 fL (ref 78.0–100.0)
Monocytes Absolute: 0.2 10*3/uL (ref 0.1–1.0)
Monocytes Relative: 8 % (ref 3–12)
Neutro Abs: 1.5 10*3/uL — ABNORMAL LOW (ref 1.7–7.7)
Neutrophils Relative %: 48 % (ref 43–77)
Platelets: 57 10*3/uL — ABNORMAL LOW (ref 150–400)
RBC: 4.21 MIL/uL — ABNORMAL LOW (ref 4.22–5.81)
RDW: 15.5 % (ref 11.5–15.5)
WBC: 3.1 10*3/uL — ABNORMAL LOW (ref 4.0–10.5)

## 2014-04-20 LAB — COMPREHENSIVE METABOLIC PANEL
ALT: 39 U/L (ref 0–53)
AST: 45 U/L — ABNORMAL HIGH (ref 0–37)
Albumin: 3.3 g/dL — ABNORMAL LOW (ref 3.5–5.2)
Alkaline Phosphatase: 92 U/L (ref 39–117)
Anion gap: 13 (ref 5–15)
BUN: 8 mg/dL (ref 6–23)
CO2: 24 mEq/L (ref 19–32)
Calcium: 9.1 mg/dL (ref 8.4–10.5)
Chloride: 104 mEq/L (ref 96–112)
Creatinine, Ser: 0.84 mg/dL (ref 0.50–1.35)
GFR calc Af Amer: >90 mL/min (ref >90)
GFR calc non Af Amer: >90 mL/min (ref >90)
Glucose, Bld: 93 mg/dL (ref 70–99)
Potassium: 3.8 mEq/L (ref 3.7–5.3)
Sodium: 141 mEq/L (ref 137–147)
Total Bilirubin: 0.5 mg/dL (ref 0.3–1.2)
Total Protein: 6.5 g/dL (ref 6.0–8.3)

## 2014-04-20 LAB — I-STAT CG4 LACTIC ACID, ED: Lactic Acid, Venous: 1.4 mmol/L (ref 0.5–2.2)

## 2014-04-20 LAB — URINALYSIS, ROUTINE W REFLEX MICROSCOPIC
Bilirubin Urine: NEGATIVE
Glucose, UA: NEGATIVE mg/dL
Hgb urine dipstick: NEGATIVE
Ketones, ur: NEGATIVE mg/dL
Leukocytes, UA: NEGATIVE
Nitrite: NEGATIVE
Protein, ur: NEGATIVE mg/dL
Specific Gravity, Urine: 1.013 (ref 1.005–1.030)
Urobilinogen, UA: 0.2 mg/dL (ref 0.0–1.0)
pH: 5.5 (ref 5.0–8.0)

## 2014-04-20 LAB — ETHANOL: Alcohol, Ethyl (B): 29 mg/dL — ABNORMAL HIGH (ref 0–11)

## 2014-04-20 LAB — TROPONIN I: Troponin I: <0.3 ng/mL (ref <0.30)

## 2014-04-20 MED ORDER — METHYLPREDNISOLONE SODIUM SUCC 125 MG IJ SOLR
125.0000 mg | Freq: Once | INTRAMUSCULAR | Status: AC
  Administered 2014-04-20: 125 mg via INTRAVENOUS
  Filled 2014-04-20: qty 2

## 2014-04-20 MED ORDER — SODIUM CHLORIDE 0.9 % IV BOLUS (SEPSIS)
1000.0000 mL | Freq: Once | INTRAVENOUS | Status: AC
  Administered 2014-04-20: 1000 mL via INTRAVENOUS

## 2014-04-20 MED ORDER — ALBUTEROL SULFATE (2.5 MG/3ML) 0.083% IN NEBU
5.0000 mg | INHALATION_SOLUTION | Freq: Once | RESPIRATORY_TRACT | Status: AC
  Administered 2014-04-20: 5 mg via RESPIRATORY_TRACT
  Filled 2014-04-20: qty 6

## 2014-04-20 MED ORDER — IPRATROPIUM BROMIDE 0.02 % IN SOLN
0.5000 mg | Freq: Once | RESPIRATORY_TRACT | Status: AC
  Administered 2014-04-20: 0.5 mg via RESPIRATORY_TRACT
  Filled 2014-04-20: qty 2.5

## 2014-04-20 NOTE — ED Notes (Signed)
Bed: WA08 Expected date:  Expected time:  Means of arrival:  Comments: EMS 

## 2014-04-20 NOTE — Progress Notes (Signed)
  CARE MANAGEMENT ED NOTE 04/20/2014  Patient:  [name], [name]   Account Number:  [id number]  Date Initiated:  04/20/2014  Documentation initiated by:  Livia Snellen  Subjective/Objective Assessment:   Patient presents to Ed with near syncopal event     Subjective/Objective Assessment Detail:   Patient with pmhx of MI, Seizures, Hep B, Hep C     Action/Plan:   Action/Plan Detail:   Anticipated DC Date:  04/20/2014     Status Recommendation to Physician:   Result of Recommendation:    Other ED Luck  Other  PCP issues    Choice offered to / List presented to:            Status of service:  Completed, signed off  ED Comments:   ED Comments Detail:  EDCM spoke to patient at bedside.  Patient requesting list of pcps who accept his insurnace as he would like to find Vasquez new pcp.  EDCM provided patient with list of pcps who accept [employer]/Medicare insurance within [place] 15 mile radius of patient's zip code.  Patient thankful for resources.  No further EDCM needs at this time.
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
## 2014-04-20 NOTE — Discharge Instructions (Signed)
1. Medications: usual home medications 2. Treatment: rest, drink plenty of fluids,  3. Follow Up: Please followup with your primary doctor in 3 days for discussion of your diagnoses and further evaluation after today's visit; if you do not have a primary care doctor use the resource guide provided to find one; Please return to the ER for repeat episodes

## 2014-04-20 NOTE — ED Notes (Signed)
Entered patient room to discharge patient. Patient not found in room. Patient gown laying on bed.

## 2014-04-20 NOTE — ED Provider Notes (Signed)
Patient presented to the ER with near syncope.patient describes sudden onset of generalized weakness and dizziness, fell to the floor because his arms and legs were shaking. He reports no loss of consciousness, however. Patient reports a history of seizure in the past.  Face to face Exam: HEENT - PERRLA Lungs - CTAB Heart - RRR, no M/R/G Abd - S/NT/ND Neuro - alert, oriented x3  Plan: Workup performed, no acute pathology seen. Etiology of the episode is unclear. Offered admission, patient declines.   Orpah Greek, MD 04/20/14 [phone]

## 2014-07-04 DIAGNOSIS — I5032 Chronic diastolic (congestive) heart failure: Secondary | ICD-10-CM | POA: Diagnosis not present

## 2014-07-04 DIAGNOSIS — I2510 Atherosclerotic heart disease of native coronary artery without angina pectoris: Secondary | ICD-10-CM | POA: Diagnosis not present

## 2014-07-14 ENCOUNTER — Emergency Department (HOSPITAL_COMMUNITY)
Admission: EM | Admit: 2014-07-14 | Discharge: 2014-07-14 | Disposition: A | Discharge status: Home / Self Care | Payer: Medicaid Other | Attending: Emergency Medicine | Admitting: Emergency Medicine

## 2014-07-14 ENCOUNTER — Encounter (HOSPITAL_COMMUNITY): Payer: Self-pay | Admitting: Emergency Medicine

## 2014-07-14 DIAGNOSIS — Z8679 Personal history of other diseases of the circulatory system: Secondary | ICD-10-CM | POA: Insufficient documentation

## 2014-07-14 DIAGNOSIS — Z79899 Other long term (current) drug therapy: Secondary | ICD-10-CM | POA: Insufficient documentation

## 2014-07-14 DIAGNOSIS — J439 Emphysema, unspecified: Secondary | ICD-10-CM | POA: Insufficient documentation

## 2014-07-14 DIAGNOSIS — Z720 Tobacco use: Secondary | ICD-10-CM | POA: Insufficient documentation

## 2014-07-14 DIAGNOSIS — Z8669 Personal history of other diseases of the nervous system and sense organs: Secondary | ICD-10-CM | POA: Diagnosis not present

## 2014-07-14 DIAGNOSIS — I2510 Atherosclerotic heart disease of native coronary artery without angina pectoris: Secondary | ICD-10-CM | POA: Diagnosis not present

## 2014-07-14 DIAGNOSIS — Z87828 Personal history of other (healed) physical injury and trauma: Secondary | ICD-10-CM | POA: Diagnosis not present

## 2014-07-14 DIAGNOSIS — E780 Pure hypercholesterolemia: Secondary | ICD-10-CM | POA: Diagnosis not present

## 2014-07-14 DIAGNOSIS — R454 Irritability and anger: Secondary | ICD-10-CM | POA: Diagnosis not present

## 2014-07-14 DIAGNOSIS — Z8619 Personal history of other infectious and parasitic diseases: Secondary | ICD-10-CM | POA: Diagnosis not present

## 2014-07-14 DIAGNOSIS — Z046 Encounter for general psychiatric examination, requested by authority: Secondary | ICD-10-CM | POA: Diagnosis present

## 2014-07-14 DIAGNOSIS — I5032 Chronic diastolic (congestive) heart failure: Secondary | ICD-10-CM | POA: Diagnosis not present

## 2014-07-14 DIAGNOSIS — Z7982 Long term (current) use of aspirin: Secondary | ICD-10-CM | POA: Diagnosis not present

## 2014-07-14 DIAGNOSIS — B182 Chronic viral hepatitis C: Secondary | ICD-10-CM | POA: Diagnosis not present

## 2014-07-14 LAB — COMPREHENSIVE METABOLIC PANEL
ALT: 54 U/L — ABNORMAL HIGH (ref 0–53)
AST: 57 U/L — ABNORMAL HIGH (ref 0–37)
Albumin: 4.3 g/dL (ref 3.5–5.2)
Alkaline Phosphatase: 107 U/L (ref 39–117)
Anion gap: 9 (ref 5–15)
BUN: 12 mg/dL (ref 6–23)
CO2: 24 mmol/L (ref 19–32)
Calcium: 9.1 mg/dL (ref 8.4–10.5)
Chloride: 107 mmol/L (ref 96–112)
Creatinine, Ser: 0.92 mg/dL (ref 0.50–1.35)
GFR calc Af Amer: >90 mL/min (ref >90)
GFR calc non Af Amer: 90 mL/min — ABNORMAL LOW (ref >90)
Glucose, Bld: 94 mg/dL (ref 70–99)
Potassium: 3.8 mmol/L (ref 3.5–5.1)
Sodium: 140 mmol/L (ref 135–145)
Total Bilirubin: 1.4 mg/dL — ABNORMAL HIGH (ref 0.3–1.2)
Total Protein: 7.2 g/dL (ref 6.0–8.3)

## 2014-07-14 LAB — RAPID URINE DRUG SCREEN, HOSP PERFORMED
Amphetamines: NOT DETECTED
Barbiturates: NOT DETECTED
Benzodiazepines: POSITIVE — AB
Cocaine: NOT DETECTED
Opiates: NOT DETECTED
Tetrahydrocannabinol: POSITIVE — AB

## 2014-07-14 LAB — CBC
HCT: 45.3 % (ref 39.0–52.0)
Hemoglobin: 15.8 g/dL (ref 13.0–17.0)
MCH: 31.7 pg (ref 26.0–34.0)
MCHC: 34.9 g/dL (ref 30.0–36.0)
MCV: 90.8 fL (ref 78.0–100.0)
Platelets: 74 10*3/uL — ABNORMAL LOW (ref 150–400)
RBC: 4.99 MIL/uL (ref 4.22–5.81)
RDW: 15 % (ref 11.5–15.5)
WBC: 7.5 10*3/uL (ref 4.0–10.5)

## 2014-07-14 LAB — ACETAMINOPHEN LEVEL: Acetaminophen (Tylenol), Serum: <10 ug/mL — ABNORMAL LOW (ref 10–30)

## 2014-07-14 LAB — SALICYLATE LEVEL: Salicylate Lvl: <4 mg/dL (ref 2.8–20.0)

## 2014-07-14 LAB — ETHANOL: Alcohol, Ethyl (B): <5 mg/dL (ref 0–9)
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
## 2014-07-14 NOTE — Discharge Instructions (Signed)
Anger Management Anger is a normal human emotion. However, anger can range from mild irritation to rage. When your anger becomes harmful to yourself or others, it is unhealthy anger.  CAUSES  There are many reasons for unhealthy anger. Many people learn how to express anger from observing how their family expressed anger. In troubled, chaotic, or abusive families, anger can be expressed as rage or even violence. Children can grow up never learning how healthy anger can be expressed. Factors that contribute to unhealthy anger include:   Drug or alcohol abuse.  Post-traumatic stress disorder.  Traumatic brain injury. COMPLICATIONS  People with unhealthy anger tend to overreact and retaliate against a real or imagined threat. The need to retaliate can turn into violence or verbal abuse against another person. Chronic anger can lead to health problems, such as hypertension, high blood pressure, and depression. TREATMENT  Exercising, relaxing, meditating, or writing out your feelings all can be beneficial in managing moderate anger. For unhealthy anger, the following methods may be used:  Cognitive-behavioral counseling (learning skills to change the thoughts that influence your mood).  Relaxation training.  Interpersonal counseling.  Assertive communication skills.  Medication. Document Released: 03/30/2007 Document Revised: 08/25/2011 Document Reviewed: 08/08/2010 [facility] Patient Information 2015 [place], [place]. This information is not intended to replace advice given to you by your health care provider. Make sure you discuss any questions you have with your health care provider.

## 2014-07-14 NOTE — ED Notes (Signed)
Patient discharged home.
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
## 2014-07-14 NOTE — ED Notes (Signed)
Bed: KF84 Expected date:  Expected time:  Means of arrival:  Comments: Hold for [place]

## 2014-07-28 ENCOUNTER — Emergency Department: Payer: Self-pay | Admitting: Emergency Medicine

## 2014-07-28 DIAGNOSIS — S59919A Unspecified injury of unspecified forearm, initial encounter: Secondary | ICD-10-CM | POA: Diagnosis not present

## 2014-07-28 DIAGNOSIS — F10120 Alcohol abuse with intoxication, uncomplicated: Secondary | ICD-10-CM | POA: Diagnosis not present

## 2014-07-28 DIAGNOSIS — Y907 Blood alcohol level of 200-239 mg/100 ml: Secondary | ICD-10-CM | POA: Diagnosis not present

## 2014-07-28 DIAGNOSIS — S0990XA Unspecified injury of head, initial encounter: Secondary | ICD-10-CM | POA: Diagnosis not present

## 2014-07-28 DIAGNOSIS — I10 Essential (primary) hypertension: Secondary | ICD-10-CM | POA: Diagnosis not present

## 2014-07-28 DIAGNOSIS — Z7901 Long term (current) use of anticoagulants: Secondary | ICD-10-CM | POA: Diagnosis not present

## 2014-07-28 DIAGNOSIS — S30810A Abrasion of lower back and pelvis, initial encounter: Secondary | ICD-10-CM | POA: Diagnosis not present

## 2014-07-28 DIAGNOSIS — S50812A Abrasion of left forearm, initial encounter: Secondary | ICD-10-CM | POA: Diagnosis not present

## 2014-07-28 DIAGNOSIS — Z23 Encounter for immunization: Secondary | ICD-10-CM | POA: Diagnosis not present

## 2014-07-28 DIAGNOSIS — R030 Elevated blood-pressure reading, without diagnosis of hypertension: Secondary | ICD-10-CM | POA: Diagnosis not present

## 2014-07-28 DIAGNOSIS — F10129 Alcohol abuse with intoxication, unspecified: Secondary | ICD-10-CM | POA: Diagnosis not present

## 2014-07-28 DIAGNOSIS — Z720 Tobacco use: Secondary | ICD-10-CM | POA: Diagnosis not present

## 2014-07-28 DIAGNOSIS — S199XXA Unspecified injury of neck, initial encounter: Secondary | ICD-10-CM | POA: Diagnosis not present

## 2014-07-28 DIAGNOSIS — Z7982 Long term (current) use of aspirin: Secondary | ICD-10-CM | POA: Diagnosis not present

## 2014-09-01 ENCOUNTER — Telehealth: Payer: Self-pay

## 2014-09-01 NOTE — Telephone Encounter (Signed)
Sister Debrorah called requesting a referral to a psychologist   [phone]

## 2014-09-12 DIAGNOSIS — M545 Low back pain: Secondary | ICD-10-CM | POA: Diagnosis not present

## 2014-09-18 ENCOUNTER — Ambulatory Visit: Payer: Self-pay | Admitting: Family Medicine

## 2014-10-06 NOTE — H&P (Signed)
PATIENT NAME:  [name], EICHENBERGER MR#:  [id number] DATE OF BIRTH:  07-19-1955  DATE OF ADMISSION:  02/01/2013  REFERRING PHYSICIAN: Valli Glance. Owens Shark, MD  PRIMARY CARE PHYSICIAN: He sees a physician in [place].   CHIEF COMPLAINT: Altered mental status and chest pain.   HISTORY OF PRESENT ILLNESS: This is a 59 year old male with past medical history of hepatitis C and B and ex-chronic alcoholic user with history of COPD and coronary artery disease. The patient was brought by his sister due to decreased mental status and increased confusion and lethargy over the last few days. The patient is baseline awake and alert x3. Over the last few days, he has been progressively more lethargic and confused. Family denies any drug use, any overdose. As well, there is no recent illness, no fever, no injury. The patient is an extremely poor historian and cannot give any reliable history. At one point, per documentation, he mentioned he has some complaints of chest pain, which he currently denies, but as well, cannot take any reliable review of systems from him. His first troponin was negative. He was given 324 of aspirin. The patient's ammonia level was elevated at 57. As well, he was noticed to have hyponatremia and hypokalemia as well. The hospitalist service was requested to admit the patient for further hydration and replacement of his electrolytes and treatment of his encephalopathy. The patient was found to be on multiple psych medications, including benzodiazepines, Remeron and BuSpar. Family reported the patient has been compliant with his meds. The patient was discharged in June from the hospital with cellulitis.   REVIEW OF SYSTEMS:  GENERAL: The patient is confused , cannot give reliable review of systems, but he denies any fever, chills, fatigue, weakness.  EYES: Denies blurry vision, double vision, inflammation or glaucoma.  ENT: Denies tinnitus, ear pain, epistaxis, discharge.  RESPIRATORY: Denies cough,  wheezing, shortness of breath.  CARDIOVASCULAR: Denies chest pain, even though earlier he complained of that. As well, denies any edema or palpitations.  GASTROINTESTINAL: Denies nausea, vomiting, diarrhea, abdominal pain.  GENITOURINARY: Denies dysuria, hematuria, renal colic.  ENDOCRINE: Denies polyuria, polydipsia, heat or cold intolerance.  HEMATOLOGY: Denies anemia, easy bruising, bleeding diathesis.  INTEGUMENTARY: Denies acne, rash or skin lesions.  MUSCULOSKELETAL: Denies any gout, arthritis or cramps.  NEUROLOGIC: Denies any headache, migraine, weakness or numbness.  PSYCHIATRIC: Denies any anxiety. Reports he quit drinking a long time ago. Denies any substance abuse.   PAST MEDICAL HISTORY:  1. COPD.  2. Tobacco abuse.  3. Coronary artery disease, status post stent.  4. Hepatitis B and hepatitis C with possible liver cirrhosis.   PAST SURGICAL HISTORY: Coronary artery stent placement.   FAMILY HISTORY: Significant for rheumatoid arthritis and osteoarthritis in his mother.   SOCIAL HISTORY: The patient is a chronic smoker, still smokes a few cigarettes every day. He has ex-chronic alcohol use, denies any current alcohol use.   ALLERGIES: No known drug allergies.   HOME MEDICATIONS:  1. Aspirin 81 mg oral daily.  2. Spironolactone 25 mg oral daily.  3. Lisinopril 2.5 mg oral daily.  4. Nitroglycerin sublingual as needed.  5. Lexapro 20 mg oral daily.  6. Mirtazapine 15 mg oral at bedtime.  7. Trazodone 50 mg 3 times a day.  8. Crestor 40 mg oral daily.  9. Benzonatate 100 mg oral 3 times a day as needed.  10. Alprazolam 3 to 4 tablets of 1 mg oral daily.  11. Buspirone 15 mg oral 3 times a day.  12. Lunesta 3 mg oral daily at bedtime.  13. Coreg 6.25 mg oral 2 times a day.  14. ProAir as needed.  15. Lasix 20 mg oral daily. 16. Metolazone 2.5 mg oral every other day.  17. Nicotine patch.  18. Potassium chloride oral daily.   PHYSICAL EXAMINATION:  VITAL SIGNS:  Temperature 97.8, pulse 68, respiratory rate 18, blood pressure 123/58, last one was 97/60 on the monitor, saturating 96% on room.  GENERAL: Well-nourished male who looks comfortable, in no apparent distress.  HEENT: Head atraumatic, normocephalic. Pupils equally reactive to light. Pink conjunctivae. Anicteric sclerae. Moist oral mucosa.  NECK: Supple. No thyromegaly. No JVD. No carotid bruits.  CHEST: Good air entry bilaterally. No wheezing, rales, rhonchi.  CARDIOVASCULAR: S1, S2 heard. No rubs, murmurs or gallops.  ABDOMEN: Obese, distended, soft, without tenderness. No hepatosplenomegaly. No masses. No hernias.  SKIN: Has multiple skin tattoos. No erythema. No rash.  MUSCULOSKELETAL: No joint swelling. No clubbing.  EXTREMITIES: Mild pitting edema, very minimal. No clubbing. No cyanosis. Dorsalis pedis pulse felt bilaterally.  NEUROLOGIC: Cranial nerves grossly intact. Moves all extremities without significant deficits.  PSYCHIATRIC: The patient is awake, alert x2. Appears to be lethargic, sluggish, confused.  PERTINENT LABORATORY DATA: Glucose 248, BUN 38, creatinine 1.64, sodium 125, potassium 2.4, chloride 8.2, CO2 35. Ammonia 57. Troponin is 0.02. Urine drug is positive for benzodiazepines. White blood cell 12.8, hemoglobin 15.7, hematocrit 45.9, platelets 88. Urinalysis negative for leukocyte esterase and nitrite.   ASSESSMENT AND PLAN:  1. Altered mental status, encephalopathy. This appears to be multifactorial, mainly due to hepatic encephalopathy as the patient had elevated ammonia level. As well, there is medication induced as he is on multiple psych medications, including multiple sedative medications. As well, some electrolyte abnormality, including hyponatremia, may be contributing to this. So, will start the patient on lactulose. As well, will hold all his sedative medication, including the diazepam, the mirtazapine, the Remeron, and will correct his electrolyte imbalance.  2. Chest  pain, appears to be atypical as it was reproducible by palpation. Will give him 1 dose of 325 of aspirin. Will continue to cycle his cardiac enzymes and follow the trend.  3. Hyponatremia. The patient is on multiple diuretics, might be causing this. As well, he appears to be clinically dehydrated. So, will start him on normal saline and will recheck level this afternoon.  4. Hypokalemia with potassium of 2.4. It is significant, so he will be put on IV potassium for replacement with his maintenance fluid. Will recheck level this afternoon. Will be on telemetry monitor.  5. History of chronic obstructive pulmonary disease. Does not appear to be in an exacerbation. No wheezing. Will keep him on p.r.n. DuoNebs.  6. History of tobacco abuse. The patient will be kept on the nicotine patch.  7. Deep vein thrombosis prophylaxis. Given his thrombocytopenia, will be kept on sequential compression device and TED hose.  8. Thrombocytopenia. This is most likely secondary to his liver disease from hepatitis B and C. Appears to be improving. Will avoid chemical anticoagulation.   TOTAL TIME SPENT ON ADMISSION AND PATIENT CARE: 60 minutes.   ____________________________ Albertine Patricia, MD dse:OSi D: 02/01/2013 06:49:17 ET T: 02/01/2013 08:59:00 ET JOB#: [id number]  cc: Albertine Patricia, MD, <Dictator> Bennett Vanscyoc Graciela Husbands MD ELECTRONICALLY SIGNED 02/05/2013 12:17

## 2014-10-06 NOTE — H&P (Signed)
ADDENDUM  PATIENT NAMEKHAYREE, Vasquez MR#:  [id number] DATE OF BIRTH:  September 29, 1955  DATE OF ADMISSION:  02/01/2013  ASSESSMENT AND PLAN:  1.  Acute renal failure. The patient appears to be clinically dehydrated, so it is most likely due to volume depletion. He is on multiple diuretics. We will hold all of his diuresis including spironolactone, Lasix and metolazone. Also, we will hold his lisinopril until he is appropriately hydrated.  2.  Hypertension. Blood pressure has been on the lower side, so will hold all his antihypertensive medications until blood pressure improves.   ____________________________ Albertine Patricia, MD dse:aw D: 02/01/2013 06:51:33 ET T: 02/01/2013 08:38:32 ET JOB#: [id number]  cc: Albertine Patricia, MD, <Dictator> Ellinore Merced Graciela Husbands MD ELECTRONICALLY SIGNED 02/05/2013 12:16

## 2014-10-06 NOTE — Discharge Summary (Signed)
PATIENT NAME:  [name], [name] MR#:  [id number] DATE OF BIRTH:  27-Jul-1955  DATE OF ADMISSION:  11/20/2012 DATE OF DISCHARGE:  11/21/2012  PRIMARY CARE PHYSICIAN: Iona Beard, MD in [place]:  1.  Cellulitis, ankle.  2.  Weight gain and edema.  3.  Chronic obstructive pulmonary disease.  4.  Coronary artery disease.  5.  Hyperlipidemia.  6.  Hepatitis C and B.  7.  Anxiety, depression. 8.  Thrombocytopenia.   MEDICATIONS ON DISCHARGE: Include Bayer low-dose aspirin 81 mg daily, carvedilol 6.25 mg twice a day, nitroglycerin 0.4 mg sublingual tablet every 5 minutes as needed for chest pain, ProAir HFA 2 puffs 4 times a day as needed for shortness of breath, Crestor 40 mg at bedtime, Remeron 15 mg at bedtime, Lexapro 20 mg daily, BuSpar 15 mg 3 times a day, lisinopril 2.5 mg daily, Lunesta 3 mg at bedtime, cephalexin 500 mg every 8 hours for 9 days, furosemide 20 mg daily, potassium chloride 8 mEq daily, nicotine patch 14 mg per chest wall daily.   DIET: Regular diet, regular consistency.   ACTIVITY: As tolerated.   FOLLOWUP:  In 1 to 2 weeks with Dr. Iona Beard in [place].   REASON FOR ADMISSION: The patient was admitted 11/20/2012 and discharged 11/21/2012. The patient had bilateral leg swelling for 6 weeks and positive blood culture. He was admitted with suspected bacteremia with a mild cellulitis in the right ankle and leg edema.   LABORATORY AND RADIOLOGICAL DATA: (During the hospital course)  There was a blood culture on the 5th that showed Staphylococcus species, 3 are coag negative.  On this hospital stay, his white blood cell count is 4.9, H and H 13.7 and 39.4, platelet count of 63. Glucose 110, BUN 9, creatinine 0.94, sodium 141, potassium 3.6, chloride 109, CO2 27, calcium 9.1. Liver function tests: AST slightly elevated at 69. Blood cultures were negative. TSH 0.475. Creatinine upon discharge 1.01.  Potassium was 3.1   HOSPITAL COURSE PER PROBLEM LIST:   1.  Cellulitis of the ankle: The patient was initially started on clindamycin with the staph and the initial blood culture. This was changed over to p.o. Keflex when the staph species is coag-negative, which is a contaminant, to complete treatment for cellulitis in the ankle. This is not bacteremia since the staph had 3 organisms which were all coag-negative.  This is a contaminant.  2.  Weight gain and edema: The patient was started on IV Lasix and diuresed.  I did have to replace potassium.  I switched him over to low-dose oral Lasix. Can consider switching the Lasix over to Aldactone as outpatient.  3.  Chronic obstructive pulmonary disease and tobacco abuse:  The patient's respiratory status is stable.  Lungs were clear upon discharge.  The patient was given a nicotine patch upon discharge.  4.  Coronary artery disease:  The patient is on aspirin, Coreg and a statin.  5.  Hyperlipidemia: The patient is on a statin.  6.  Hepatitis C and B with edema: Low-dose Lasix given here but can consider Aldactone as outpatient.  7.  Anxiety, depression: The patient is on numerous psychiatric medications.  8.  Thrombocytopenia, chronic:  Probably secondary to liver disease.   TIME SPENT ON DISCHARGE: 35 minutes.   ____________________________ Tana Conch. Leslye Peer, MD rjw:cb D: 11/21/2012 16:11:06 ET T: 11/21/2012 22:37:15 ET JOB#: [id number]  cc: Tana Conch. Leslye Peer, MD, <Dictator> Iona Beard, MD  Marisue Brooklyn MD ELECTRONICALLY SIGNED 12/01/2012 15:59

## 2014-10-06 NOTE — H&P (Signed)
PATIENT NAMEANDREW, [name] MR#:  [id number] DATE OF BIRTH:  01-11-1956  DATE OF ADMISSION:  11/20/2012  PRIMARY CARE PHYSICIAN: No local physician. His physician is located in [place].   REFERRING PHYSICIAN: Marjean Donna, MD.   CHIEF COMPLAINT: Bilateral leg swelling x 6 weeks and positive 1 blood culture.   HISTORY OF PRESENT ILLNESS: Mr. [name] is a 59 year old Caucasian male with a past medical history of hepatitis C and B and ex-chronic alcoholic, history of COPD and coronary artery disease. The patient states that for the last 6 weeks or more he noticed gradual increase in bilateral leg swelling and edema. He came yesterday to the Emergency Department. His symptoms did not warrant an admission. He also reported a questionable snake bite to right leg 3 weeks ago however; the snake struck over his boot without certainty of penetration. The patient did not see any skin markings for the snake fangs or holes and he did not feel any pain. Today, there is only mild redness, may be some cellulitis. However, the snake bite incident was 3 weeks ago. The patient was discharged home yesterday; however, today 1 blood culture in anaerobic bottle grew up gram-positive cocci. The other blood culture was showing no growth. The patient was called to come back to the hospital to be admitted. Today, he is asymptomatic. He feels no problem. Denies any fever. No chills. No cough. No sputum production. No chest pain. He has baseline shortness of breath from his emphysema. This is unchanged. No diarrhea. No urinary symptoms. The patient is in the process to be admitted after taking further blood cultures and may be starting antibiotics.   REVIEW OF SYSTEMS: CONSTITUTIONAL: Denies any fever. No chills. No fatigue. EYES: No blurring of vision. No double vision. ENT: No hearing impairment. No sore throat. No dysphagia. CARDIOVASCULAR: No chest pain. No syncope. No palpitations, but he has peripheral edema.  RESPIRATORY: No cough. No sputum production. No hemoptysis. He has baseline shortness of breath from his emphysema. This is unchanged. GASTROINTESTINAL: No abdominal pain, no vomiting, no diarrhea. GENITOURINARY: No dysuria. No frequency of urination. MUSCULOSKELETAL: No joint pain or swelling. No muscular pain or swelling. INTEGUMENTARY: He has mild erythema over the right leg area just above the ankle. No ulcers. NEUROLOGY: No focal weakness. No seizure activity. No headache. PSYCHIATRY: History of anxiety, but he is fine now. Denies any depression. ENDOCRINE: No polyuria or polydipsia. No heat or cold intolerance.   PAST MEDICAL HISTORY: Chronic obstructive pulmonary disease, primarily emphysema, tobacco abuse, coronary artery disease, status post stent implant, hepatitis B and hepatitis C.   PAST SURGICAL HISTORY: Coronary artery stent implants.   FAMILY HISTORY: His mother suffered from rheumatoid arthritis and degenerative osteoarthritis. She has some kind of blood disorder that it is unclear for him. She is still alive and she is 22 years old. His father is 23 years old and he is ex-chronic alcoholic.   SOCIAL HISTORY: He is single and unemployed. Social habits: Chronic smoker. He used to smoke 4 to 5 packs of unfiltered cigarettes, then later he changed that to smoking cigars. He is ex-chronic alcoholic. He used to drink in the past Gin and some Moonshine.   ADMISSION MEDICATIONS: Alprazolam 1 mg 3 times a day as needed, mirtazapine 15 mg at bedtime, BuSpar or buspirone 15 mg 1 tablet 3 times a day, escitalopram 20 mg once a day, nitroglycerin sublingual p.r.n., Crestor 40 mg once a day, Pro-Air HFA inhalation p.r.n., Lunesta 3 mg at bedtime  as needed, lisinopril 2.5 mg once a day, dicyclomine 20 mg at bedtime and Carvedilol 6.25 mg twice a day.   ALLERGIES: No known drug allergies.   PHYSICAL EXAMINATION:  VITAL SIGNS: Blood pressure 95/59, respiratory rate 18, pulse 65, temperature 98.1,  oxygen saturation 94%.  GENERAL APPEARANCE: This is a middle-aged male sitting on the stretcher in no acute distress eating his meal.  HEAD AND NECK: No pallor. No icterus. No cyanosis.  EYES, EARS, NOSE, THROAT: Normal hearing, no discharge, no ulcers. Examination of the nose showed no bleeding, no discharge, no ulcers. Oropharyngeal examination revealed normal lips and tongue. No oral thrush, no ulcers. Examination of the eyes showed normal eyelids and conjunctivae. Pupils about 5 to 6 mm, round, equal and reactive to light.  NECK: Supple. Trachea at midline. No thyromegaly. No cervical lymphadenopathy. No masses.  HEART: Normal S1, S2. No S3, S4. No murmur. No gallop. No carotid bruits. Distant heart sounds.   LUNGS: Normal breathing pattern without use of accessory muscles. No rales. No wheezing.  ABDOMEN: Obese, distended, soft without tenderness. No hepatosplenomegaly. No masses. No hernias.  SKIN: Revealed mild erythema around the right ankle area, may be consistent with mild cellulitis. No ulcers. There is peripheral edema +1 to +2 around the ankles bilaterally.  MUSCULOSKELETAL: No joint swelling. No clubbing.  NEUROLOGIC: Cranial nerves II through XII are intact. No focal motor deficit.  PSYCHIATRIC: The patient is alert and oriented x 3. Mood and affect were normal.   LABORATORY FINDINGS: Serum glucose 110, BUN 9, creatinine 0.9, sodium 141, potassium 3.6. Calcium 9.1, total protein 6.4, albumin 3.3, bilirubin 1, AST 69, ALT is 75. CBC showed white count of 4000, hemoglobin 13, hematocrit 39, platelet count 63. Again, the blood cultures x 2, 1 showed no growth and the other showed gram-positive cocci in the anaerobic bottle only.   ASSESSMENT:  1.  One positive blood culture for bacteremia in the anaerobic bottle growing gram-positive cocci. 2.  Mild cellulitis in the right leg area.  3.  Bilateral leg edema for several weeks, likely from his chronic liver disease and portal  hypertension.  4.  Chronic liver disease, likely liver cirrhosis is suspected.  5.  Thrombocytopenia presumed from portal hypertension.  6.  Chronic obstructive pulmonary disease.  7.  Ex-chronic alcoholic.  8.  Chronic heavy smoker.  9.  Coronary artery disease, status post stenting.  10. Hyperlipidemia. 11. Anxiety disorder.   PLAN: We will admit the patient to the medical floor. We will take additional 2 blood cultures. Follow up on the results of the positive anaerobic bottle to identify the organism. IV clindamycin was started to cover for the mild cellulitis, pending results of the blood cultures. We will start IV Lasix small dose, 20 mg twice a day, to reduce the peripheral edema. TED stocking for his deep vein thrombosis prophylaxis. I will consult Dr. Clayborn Bigness, infectious disease specialty, to elaborate on the positive blood culture.   Time spent in evaluating this patient took more than 1 hour.   ____________________________ Clovis Pu. Lenore Manner, MD amd:aw D: 11/20/2012 03:49:25 ET T: 11/20/2012 06:47:11 ET JOB#: [id number]  cc: Clovis Pu. Lenore Manner, MD, <Dictator> Mike Craze Irven Coe MD ELECTRONICALLY SIGNED 12/03/2012 3:14

## 2014-10-06 NOTE — Consult Note (Signed)
Details:   - Psychiatry: Brief followup with this patient. He was on the telephone when I came to visit and I could not do an extensive evaluation. Patient continues to show no signs of active suicidal behavior. He appears to be more alert today. He denies any suicidal ideation. He expresses pretty good insight into the problematic nature of his behavior. No change to my diagnosis from yesterday. No indication for commitment or transfer to psychiatry. It should be emphasized to the patient very strongly that he followup with Dr. Kasandra Knudsen after discharge. No change to medication.   Electronic Signatures: Gonzella Lex (MD)  (Signed 03-May-14 14:11)  Authored: Details   Last Updated: 03-May-14 14:11 by Gonzella Lex (MD)

## 2014-10-06 NOTE — Discharge Summary (Signed)
PATIENT NAMEJALIL, [name] MR#:  [id number] DATE OF BIRTH:  10-May-1956  DATE OF ADMISSION:  10/14/2012 DATE OF DISCHARGE:  10/16/2012  PRIMARY PSYCHIATRIST:  Dr. Kasandra Knudsen.   DISCHARGE DIAGNOSES:  1.  Xanax overdose.  2.  Attempted suicide.  3.  Depression.  4.  Chronic obstructive pulmonary disease, stable.  3.  Tobacco abuse.   CONSULTANTS:  Dr. Weber Cooks of Psychiatry.   ADMITTING HISTORY AND PHYSICAL AND HOSPITAL COURSE:  Please see detailed H and P dictated previously. In brief, a 59 year old male patient with a history of COPD on Xanax for anxiety, depression, and presented to the hospital after attempting suicide impulsively with 50 of Xanax pills. Initially the patient had acute encephalopathy secondary to the medications and respiratory failure and improved well with this as the medications wore off. Was seen by Dr. Weber Cooks who assured the patient is okay to go home, did not feel like he any further suicidal ideation, on discussing patient was regretful and mentioned that he was impulsive and would not repeat this. The patient is being discharged home with decreased Xanax dose to follow up with Dr. Kasandra Knudsen, his psychiatrist, and the patient has been given a number for suicidal line and he has good social support with family to discuss and he lives with his mother.   On the day of discharge, the patient does not have any wheezing on lung examination, vitals are in the stable range.   DISCHARGE MEDICATIONS INCLUDE:  1.  Aspirin 81 mg oral once a day.  2. Coreg 6.25 mg oral 2 times a day.  3.  Nitroglycerin 0.4 mg sublingual as needed for chest pain.  4.  Plavix 75 mg oral once a day.  5.  ProAir HFA 2 puffs inhaled 4 times a day as needed.  6.  Crestor 40 mg oral once a day.  7.  Remeron 15 mg oral once a day.  8.  Escitalopram 20 mg oral once a day.  9.  Alprazolam 1 mg oral 2 times a day as needed. Reduced from 4 times a day.   DISCHARGE INSTRUCTIONS:  Low-sodium diet. Activity as  tolerated. Follow up with Dr. Kasandra Knudsen and his primary care physician in 1 to 2 weeks.   TIME SPENT ON DAY OF DISCHARGE IN DISCHARGE ACTIVITY:  40 minutes.   ____________________________ Leia Alf Zadin Lange, MD srs:jm D: 10/16/2012 13:04:12 ET T: 10/16/2012 16:50:39 ET JOB#: [id number]  cc: Alveta Heimlich R. Darvin Neighbours, MD, <Dictator> Myer Haff, MD Neita Carp MD ELECTRONICALLY SIGNED 10/27/2012 13:57

## 2014-10-06 NOTE — Consult Note (Signed)
PATIENT NAME:  [name], [name] MR#:  [id number] DATE OF BIRTH:  15-Sep-1955  DATE OF CONSULTATION:  10/15/2012  CONSULTING PHYSICIAN:  Gonzella Lex, MD  IDENTIFYING INFORMATION AND REASON FOR CONSULT: A 59 year old man who presented to the hospital having taken a very large overdose of Xanax. Evaluation for suicidality and appropriate treatment.   HISTORY OF PRESENT ILLNESS: Information obtained from the patient and the chart. The patient's chief complaint: "I took a big overdose and tried to kill myself." The patient states that he had been feeling extremely frustrated because he felt that he was being treated poorly by his probation officer. There is actually quite a long story to it, and it sounds like he probably has a good point. In any case, he felt fed up and lost his temper and impulsively took a handful of Xanax. After he did that, he became more convinced that he wanted to kill himself, so he says he swallowed the rest of his bottle of Xanax. After he did this, he called a friend to tell him what he had done, which resulted in his being brought to the hospital immediately. He did not consume any alcohol or any other drugs with it. He says that at that time he did it, he actually thought that he wanted to kill himself. The patient describes a mood that he is chronically dysphoric, irritable and prone to losing his temper. He does not describe a clearcut new major depressive syndrome. His appetite is stable. General sleep pattern is stable. He had been enjoying multiple things in his life, and in fact, had been looking forward to a trip to the beach, which his probation officer then forbade him to go on, which is why he did this. The patient says that he had not been frequently thinking about suicide, but that it was an impulsive act. The patient was not abusing any drugs or alcohol. He has been under a great deal of pressure recently. He had legal problems which, from his way of looking at it, were  all unfair in the first place, that then resulted in this probation, which he believes is being unfairly inflicted on him. He also has some chronic pain, has had multiple stresses and suffered many griefs over the course of his lifetime. He is currently seeing Dr. Kasandra Knudsen, although he only sees him about every 6 months. The patient says his only medicines from Dr. Kasandra Knudsen are the Xanax, but I have documentation that Dr. Kasandra Knudsen also prescribes Lunesta, and the patient turns out to also be taking probably 2 antidepressants from somewhere.   PAST PSYCHIATRIC HISTORY: Never been in a psychiatric hospital. He says he has never seriously tried to kill himself in the past, but he has thought about it quite a bit. In fact, he has had frequent suicidal thoughts over the years. The patient is not aware of having any psychiatric treatment, but his medicine list shows him to be on 2 antidepressants. The patient says that he has had a lifelong problem with anger and irritability, losing his temper, acting impulsively. He paints a picture in which over the last few years, this has all gotten better since he has stopped drinking and using drugs and become a born again [place]; however, it sounds like he is still prone to some mood swings.   SOCIAL HISTORY: Currently on probation. He believes that he is not being treated fairly by his Engineer, manufacturing systems. The patient lives next door to his elderly mother in a  trailer. He lives by himself. The patient has been married 6 times, not currently married. Has 2 adult children with whom he is in only infrequent contact. His closest companions are the members of his [place] with whom he was planning to go to [employer].   FAMILY HISTORY: He describes his father as being a violent and abusive man with a substance abuse problem.   SUBSTANCE ABUSE HISTORY: The patient said he was a heavy drinker until about 1996, at which point he got sober and has not picked up any since then. He also  had a history of heroin dependence for years, but kicked that at least 12 years ago and has not used any since then. He does take Xanax regularly from Dr. Kasandra Knudsen, but denies that he abuses it.   CURRENT MEDICATIONS: There is a lengthy list in the chart of current medications. I am not sure how accurate they all are. It is listed as being ProAir 2 puffs 4 times a day, nitroglycerin p.r.n., mirtazapine 15 mg once a day, presumably at night, lisinopril 2.5 mg once a day, Lexapro 20 mg once a day, Crestor 40 mg a day, Plavix 75 mg a day, carvedilol 6.25 mg twice a day, aspirin 81 mg a day and Xanax 1 mg 3 to 4 times a day as needed for anxiety.   ALLERGIES: No known drug allergies.   REVIEW OF SYSTEMS: Currently he states his mood is feeling good. Denies depression. Denies suicidal or homicidal ideation. Does not feel hopeless. Feels optimistic about the future. Denies auditory or visual hallucinations. Denies any remarkable physical symptoms right now.   MENTAL STATUS EXAMINATION: The patient was awake and alert when I came to see him and was cooperative and pleasant for a long conversation. Made good eye contact. Normal psychomotor activity. Speech normal in rate, tone and volume. Affect appropriate, reactive, pleasant. Mood stated as being good. Thoughts generally are lucid. He still seems to be a little bit sedated, probably from the effects of the Xanax, as evidenced by the fact that several times during his story, he repeated the same section of the story to me without remembering he had already told me something. His thoughts, however, appear to be free of delusions. No bizarre thinking. Denies hallucinations. Denies suicidal or homicidal ideation. Long-term judgment seems to be chronically impaired by a tendency to anger and irritability. At the moment, it appears to be reasonably good with adequate insight. Appears to have normal intelligence.   ASSESSMENT: A 59 year old man with a long history of anger,  irritability and impulsivity which certainly must have been worse in the past when he was abusing substances. He has sobered up and gotten in touch with some positive people and now seems to be generally conducting his life pretty well. It sounds like he had some bad luck resulting in these legal charges and probably really was jerked around by his probation officer. Nevertheless, obviously his reaction to this was excessive and inappropriate. He does acknowledge that. Right now, he does not present with a major depressive syndrome. He does not appear to be psychotic. The patient does not require psychiatric hospitalization on the behavioral health unit, but certainly would do well to have more frequent outpatient psychiatric treatment, including probably therapy or at least more frequent visits with his psychiatrist.   TREATMENT PLAN: Supportive and psychoeducational therapy and medication education done with the patient. No change to his current medication other than to restart his Xanax 1  mg p.r.n. 3 to 4 times a day and his Remeron 15 mg at night. I will follow up with him tomorrow. If he is still doing well tomorrow, we can take him off the suicide precautions. I do not anticipate that we will need to bring him down to psychiatry. He should be specifically advised to get in touch with Dr. Kasandra Knudsen for followup once he leaves the hospital at the very least.   DIAGNOSIS, PRINCIPAL AND PRIMARY:  AXIS I: Adjustment disorder with mixed disturbance of mood and conduct.   SECONDARY DIAGNOSES:  AXIS I:  1. Alcohol dependence, in sustained remission.  2. Opiate dependence, in sustained remission.  3. Rule out posttraumatic stress disorder.  AXIS II: Deferred.  AXIS III: Status post Xanax overdose, coronary artery disease, asthma, chronic obstructive pulmonary disease, hypertension.  AXIS IV: Severe from multiple stresses as outlined above, acute and chronic.  AXIS V: Functioning at time of evaluation is 28.     ____________________________ Gonzella Lex, MD jtc:OSi D: 10/15/2012 22:21:42 ET T: 10/16/2012 06:36:54 ET JOB#: [id number]  cc: Gonzella Lex, MD, <Dictator> Gonzella Lex MD ELECTRONICALLY SIGNED 10/16/2012 14:27

## 2014-10-06 NOTE — Discharge Summary (Signed)
PATIENT NAMEALYN, [name] MR#:  [id number] DATE OF BIRTH:  [birth year]  DATE OF ADMISSION:  02/01/2013 DATE OF DISCHARGE:  02/02/2013  DISCHARGE DIAGNOSES 1.  Hepatic encephalopathy.  2.  Acute renal failure.  3.  Hyperkalemia.  4.  Chronic obstructive pulmonary disease.  5.  Coronary artery disease.   IMAGING STUDIES DONE: Include 1.  A CT of the head without contrast which showed no acute abnormalities.  2.  Chest x-ray, PA and lateral, showed no acute cardiopulmonary disease. Stable appearance.   CONSULTS: None.   ADMITTING HISTORY AND PHYSICAL: Please see detailed H and P dictated by dictated by Dr. Waldron Labs previously. In brief, a 59 year old male patient with past history of hepatitis B and past chronic alcohol user presented to the Emergency Room with altered mental status. The patient was found to have elevated ammonia, confusion, admitted with hepatic encephalopathy to the hospitalist service. He had some vague chest pain with history of GERD.  He was ruled out with cardiac enzymes. The patient improved well, being restarted on the lactulose. By day 2, he was feeling well. He was dehydrated, being on high-dose Lasix, and hypokalemic which were replaced and were back to normal by the time of discharge. I have discussed the case with the patient and his sister and reduced the dose on the Lasix, asked the patient to be compliant with his lactulose and discharged back home.   Prior to discharge, on exam patient did not have any abdominal tenderness and normal bowel sounds on exam.   DISCHARGE MEDICATIONS: Include 1.  Bayer aspirin 81 mg oral once a day.  2.  Coreg 6.25 mg oral 2 times a day.  3.  Nitroglycerin 0.4 sublingual as needed for chest pain.  4.  ProAir HFA 2 puffs inhaled 4 times a day as needed for shortness of breath.  5.  Crestor 40 mg oral once a day.  6.  Remeron 15 mg oral once a day.  7.  Escitalopram 20 mg oral once a day.  8.  Buspirone 15 mg oral 2 times a  day.  9.  Lisinopril 2.5 mg oral once a day.  10.  Lunesta 3 mg oral once a day at bedtime.  11.  Spironolactone 25 mg oral once a day.  12.  Trazodone 100 mg, 1 tablet oral 3 times a day.  13.  Benzonatate 100 mg oral 3 times a day as needed.  14.  Alprazolam 1 mg, half tablet oral 2 times a day as needed.  15.  Lasix 20 mg, 2 tablets oral once a day.  16.  Lactulose 30 mL oral 2 times a day.  17.  Potassium chloride 20 mEq oral once a day.   DISCHARGE INSTRUCTIONS: Low-sodium, regular consistency diet. Activity as tolerated and followup with primary care physician, Dr. Berdine Addison, in 1 to 2 weeks.   TIME SPENT ON DAY OF DISCHARGE IN DISCHARGE ACTIVITY: Was 43 minutes.   ____________________________ Darren Alf Jaedin Regina, MD srs:cs D: 02/04/2013 20:48:00 ET T: 02/04/2013 21:39:08 ET JOB#: [id number]  cc: Curt Bears A. Hill, NP Martavia Tye R. Ilena Dieckman, MD, <Dictator>  Neita Carp MD ELECTRONICALLY SIGNED 02/04/2013 22:06

## 2014-10-06 NOTE — H&P (Signed)
PATIENT NAMEDESMUND, Vasquez MR#:  [id number] DATE OF BIRTH:  Jun 07, 1956  DATE OF ADMISSION:  10/14/2012  PRIMARY CARE PHYSICIAN: Unknown.   REFERRING PHYSICIAN: Connye Burkitt. Lovena Le, MD  CHIEF COMPLAINT: Overdose on Xanax.   HISTORY OF PRESENT ILLNESS: The patient is a 59 year old white male with a past medical history of hypertension, hyperlipidemia, peripheral vascular disease, coronary artery disease, COPD, who was brought to the Emergency Department after taking 50 pills of Xanax. No family members are available to obtain history. The history is mainly obtained from the Emergency Department physician as well as nursing staff. Uncertain about the cause of ingestion of the high dose of the Xanax. At the time of my interview, the patient is extremely confused, unable to obtain any history from the patient. The patient has been illogically speaking. As mentioned above, workup in the Emergency Department. Urine drug screen is positive for cannabinoids and benzos. ABGs currently pending. The patient is able to maintain blood pressure as well as oxygen saturations well. Involuntary commitment paperwork has been done.   PAST MEDICAL HISTORY:  1. Hypertension.  2. Hyperlipidemia.  3. COPD.  4. Continued tobacco use.  5. Peripheral vascular disease.  6. Coronary artery disease, with unknown workup and interventions.  7. Polysubstance abuse.  8. Hepatitis C.  9. Hepatitis B.  10. Degenerative joint disease.   PAST SURGICAL HISTORY: Coronary artery bypass grafting.   ALLERGIES: No known drug allergies.   HOME MEDICATIONS:  1. ProAir 90 mcg 2 puffs 4 times a day.  2. Nitroglycerin 0.4 mg 1 tablet sublingual every 5 minutes as needed.  3. Mirtazapine 15 mg 1 tablet once a day.  4. Lisinopril 2.5 mg once a day.  5. Escitalopram 20 mg once a day.  6. Crestor 40 mg once a day.  7. Plavix 75 mg once a day.  8. Carvedilol 6.25 mg 2 times a day.  9. Aspirin 81 mg once a day.  10. Alprazolam 1 mg 1  tablet 3 to 4 times a day as needed for anxiety.   SOCIAL HISTORY: Continues to smoke per records. Unknown history if the patient uses alcohol, and some mention that the patient uses polysubstance.   FAMILY HISTORY: Could not be obtained from the patient.   REVIEW OF SYSTEMS: Could not be obtained from the patient considering the patient's altered mental status.   PHYSICAL EXAMINATION:  GENERAL: This is a well-built, well-nourished male, looks much older than his stated age, lying down in the bed, confused.  VITAL SIGNS: Temperature 98.6, pulse 64, blood pressure 111/64, respiratory rate of 18, oxygen saturation is 99% on 2 liters of oxygen.  HEENT: Head normocephalic, atraumatic. Eyes: No scleral icterus. Mid dilated pupils, equal and reactive to light. Mucous membranes: Mild dryness. Could not examine the pharynx. NECK: Supple. No lymphadenopathy. No JVD. No carotid bruit. Could not appreciate thyromegaly.  CHEST: Has no focal tenderness.  LUNGS: Bilaterally clear to auscultation.  HEART: S1 and S2 regular. No murmurs are heard.  ABDOMEN: Bowel sounds present. Soft, nontender, nondistended. No hepatosplenomegaly.  MUSCULOSKELETAL: Could not examine; however, moving all 4 extremities.  SKIN: No rash or lesions.  NEUROLOGIC: The patient is extremely confused.  EXTREMITIES: No pedal edema. Pulses 2+. Moving all 4 extremities.  LYMPHATIC SYSTEM: No cervical, axillary or inguinal lymphadenopathy.   LABORATORIES:  1. CMP is completely within normal limits except for potassium of 3.2.  2. Alcohol level is less than 3.  3. TSH 4.17.  4. Urine drug screen  is positive for cannabinoids and benzos.   ASSESSMENT AND PLAN: The patient is a 59 year old male who comes in with overdose with benzodiazepines of uncertain cause.   1. Benzodiazepine overdose. Admit the patient to the monitored bed. Keep the patient on suicide precautions. Will have a close followup with the vital signs every 2 hours for  initial 6 to 7 hours, followed by then check the vital signs every 4 hours. If the patient becomes more oriented, we can keep the patient on vital signs per the floor protocol. Will consider consulting psychiatry once the patient is medically stable.  2. Altered mental status. In addition to benzodiazepines, the patient is somewhat more confused. Also concerned about any CO2 retention considering the patient's history of chronic obstructive pulmonary disease. Obtain the ABG.  3. Hypokalemia. Will replace by IV fluids.  4. Tobacco use. Will keep the patient on nicotine patch.  5. Hypertension, currently well controlled.  6. History of chronic obstructive pulmonary disease. No current issues. Will continue to watch. 7. Keep the patient on deep vein thrombosis prophylaxis with Lovenox.   TIME SPENT: 55 minutes.   ____________________________ Monica Becton, MD pv:OSi D: 10/14/2012 07:57:18 ET T: 10/14/2012 08:23:38 ET JOB#: [id number]  cc: Monica Becton, MD, <Dictator> Monica Becton MD ELECTRONICALLY SIGNED 10/18/2012 6:57

## 2014-10-06 NOTE — Consult Note (Signed)
Brief Consult Note: Diagnosis: adjustment disorder with mood and conduct symptoms, ro ptsd.   Patient was seen by consultant.   Consult note dictated.   Orders entered.   Comments: Psychiatry: PAtient seen. Chart reviewed. Note dictated. Patient made a very impulsive but serious suicide attempt but now is in good spirits and denies any wish to die. I put him on his remeron 15mg  that is on his med list and restarted the xanax 1mg  q6prn anxiety as Dr Kasandra Knudsen does prescribe it and we dont want him in withdrawl. Will check in tomorrow and if he's ok will dc suicide precautions. Prob doesn't need BH transfer if he stays free of depression symptoms tomorrow.  Electronic Signatures: Gonzella Lex (MD)  (Signed [phone] 20:56)  Authored: Brief Consult Note   Last Updated: 02-May-14 20:56 by Gonzella Lex (MD)

## 2015-01-21 IMAGING — CR DG LUMBAR SPINE COMPLETE 4+V
5 series · 5 of 5 positions shown · non-contrast
Comparison: 08/26/2013 and CT scan dated 07/01/2013

CLINICAL DATA: Low back pain secondary to a fall and blunt trauma
today.

EXAM:
LUMBAR SPINE - COMPLETE 4+ VIEW

[t lumbar spine ap]
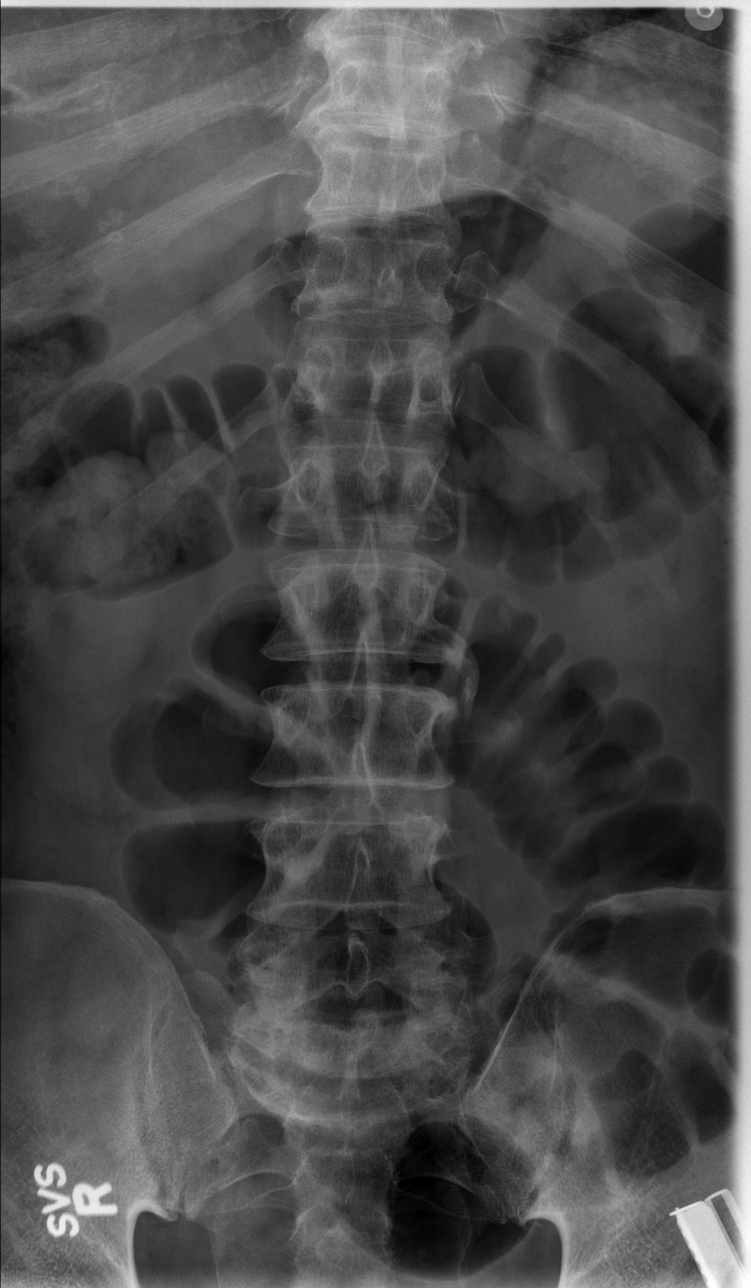

[t lumbar spine obl (1 of 2)]
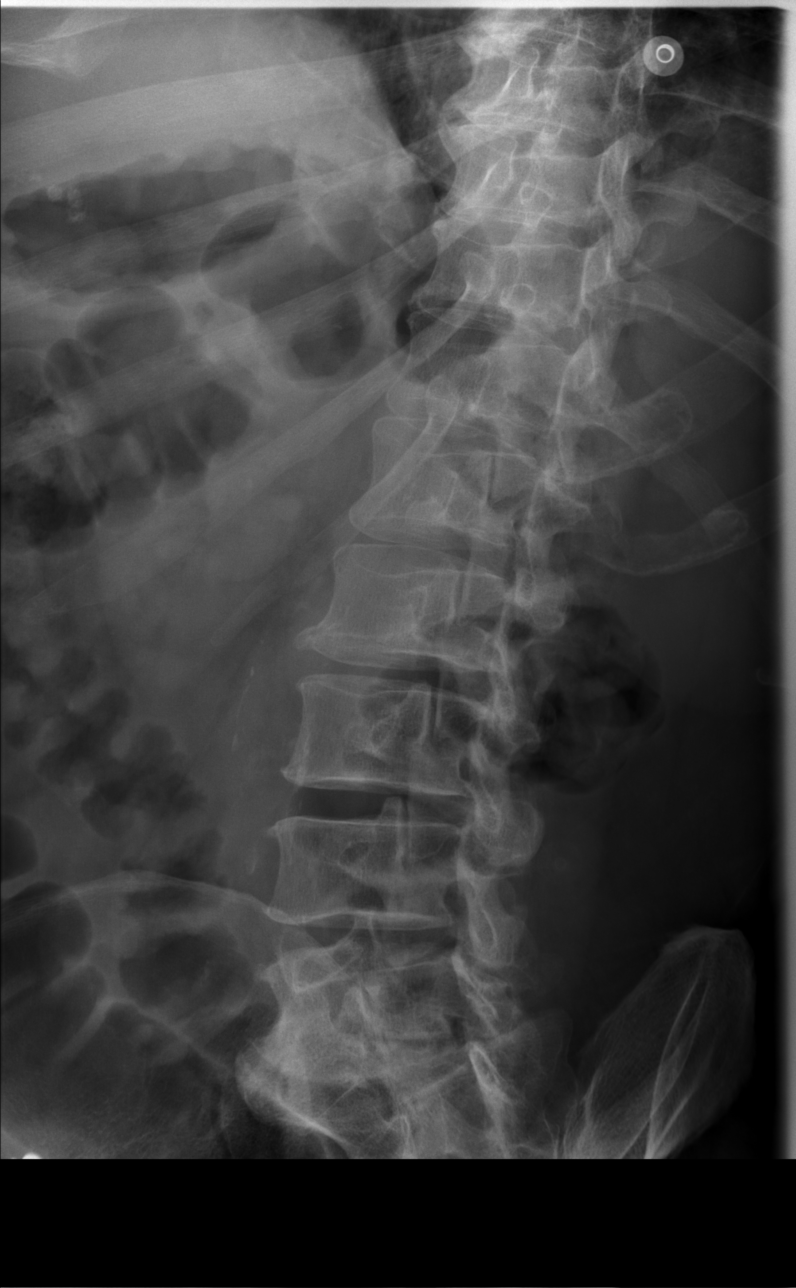

[t lumbar spine obl (2 of 2)]
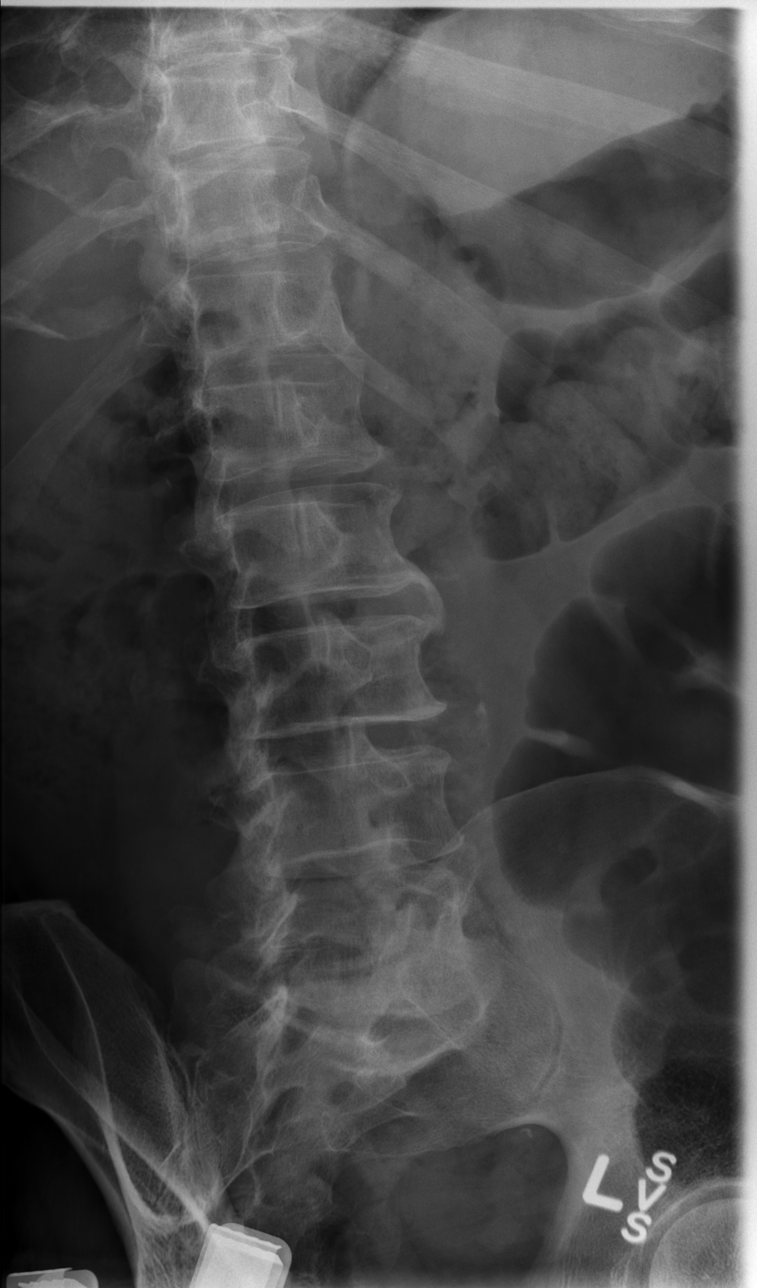

[t lumbar spine lat]
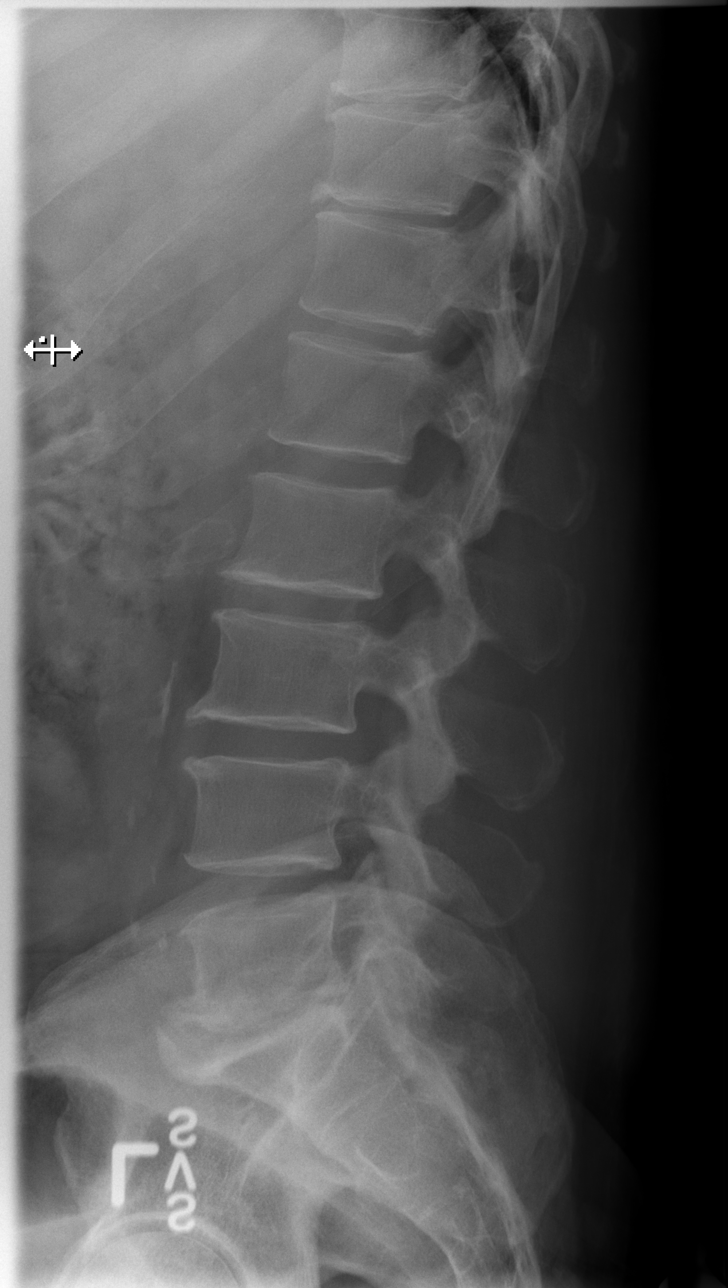

[t lumbar l-5 s-1 spot]
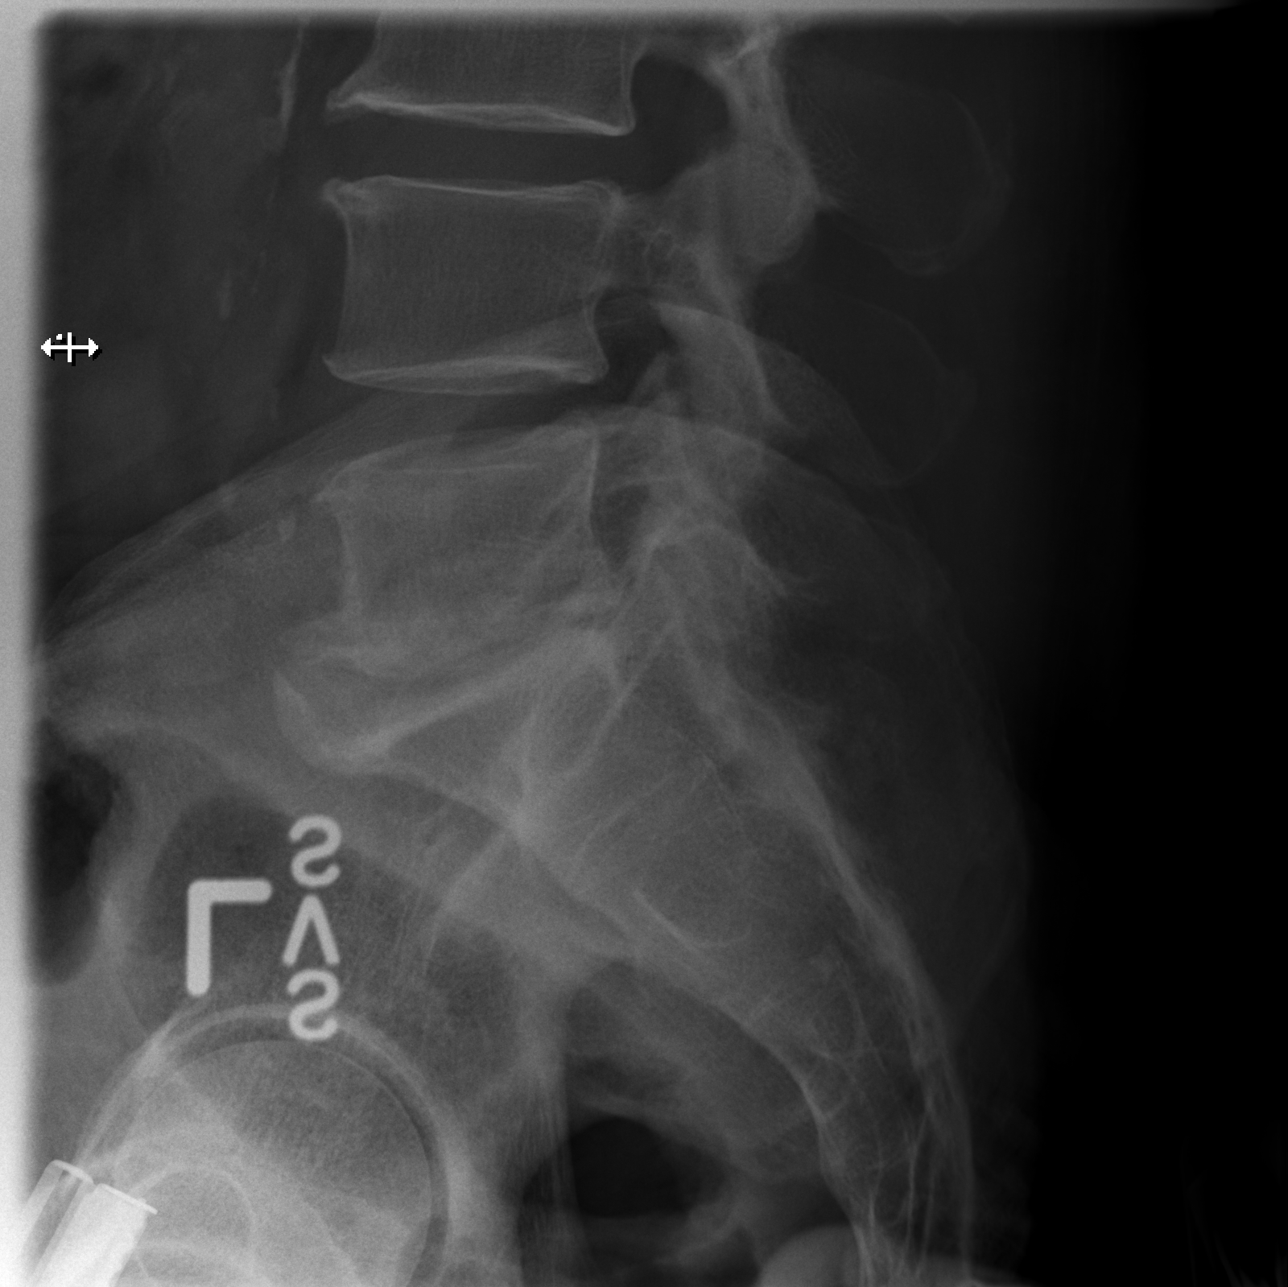

[5 of 5 positions shown; findings below may reference images not displayed]

FINDINGS: There is chronic narrowing of the L5-S1 disc space. There is no
fracture or subluxation or other acute abnormality. Note is made of
multiple gallstones as noted on the prior CT scan.
IMPRESSION: No acute abnormality.  Chronic degenerative disc disease at L5-S1.

## 2015-02-10 ENCOUNTER — Telehealth: Payer: Self-pay | Admitting: Physician Assistant

## 2015-02-10 NOTE — Telephone Encounter (Signed)
Patient has an appointment on 02/21/2015 with [name], PA-C. Patient's sister called requesting that we mail the new patient paperwork to her home. Her name is Ledford Goodson and her address is [address] [place], [place] [address]. Any questions please call her at [phone]

## 2015-02-13 NOTE — Telephone Encounter (Signed)
Paperwork mailed as requested.

## 2015-02-21 ENCOUNTER — Encounter: Payer: Self-pay | Admitting: Physician Assistant

## 2015-02-21 ENCOUNTER — Ambulatory Visit (INDEPENDENT_AMBULATORY_CARE_PROVIDER_SITE_OTHER): Payer: Medicare Other | Admitting: Physician Assistant

## 2015-02-21 ENCOUNTER — Other Ambulatory Visit: Payer: Self-pay | Admitting: Physician Assistant

## 2015-02-21 VITALS — BP 101/67 | HR 66 | Temp 97.7°F | Resp 16 | Ht 68.0 in | Wt 217.4 lb

## 2015-02-21 DIAGNOSIS — Z720 Tobacco use: Secondary | ICD-10-CM | POA: Diagnosis not present

## 2015-02-21 DIAGNOSIS — Z79899 Other long term (current) drug therapy: Secondary | ICD-10-CM

## 2015-02-21 DIAGNOSIS — Z23 Encounter for immunization: Secondary | ICD-10-CM | POA: Diagnosis not present

## 2015-02-21 DIAGNOSIS — R739 Hyperglycemia, unspecified: Secondary | ICD-10-CM | POA: Diagnosis not present

## 2015-02-21 DIAGNOSIS — F99 Mental disorder, not otherwise specified: Secondary | ICD-10-CM | POA: Diagnosis not present

## 2015-02-21 DIAGNOSIS — Z8249 Family history of ischemic heart disease and other diseases of the circulatory system: Secondary | ICD-10-CM | POA: Diagnosis not present

## 2015-02-21 DIAGNOSIS — I2510 Atherosclerotic heart disease of native coronary artery without angina pectoris: Secondary | ICD-10-CM | POA: Diagnosis not present

## 2015-02-21 MED ORDER — NITROGLYCERIN 0.4 MG SL SUBL: 0.4000 mg | SUBLINGUAL_TABLET | SUBLINGUAL | Status: DC | PRN

## 2015-02-21 MED ORDER — TRAZODONE HCL 100 MG PO TABS: 100.0000 mg | ORAL_TABLET | Freq: Every day | ORAL | Status: DC
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
## 2015-02-22 DIAGNOSIS — E780 Pure hypercholesterolemia: Secondary | ICD-10-CM | POA: Diagnosis not present

## 2015-02-22 DIAGNOSIS — I5032 Chronic diastolic (congestive) heart failure: Secondary | ICD-10-CM | POA: Diagnosis not present

## 2015-02-22 DIAGNOSIS — I2510 Atherosclerotic heart disease of native coronary artery without angina pectoris: Secondary | ICD-10-CM | POA: Diagnosis not present

## 2015-02-22 DIAGNOSIS — B182 Chronic viral hepatitis C: Secondary | ICD-10-CM | POA: Diagnosis not present

## 2015-02-22 LAB — CBC WITH DIFFERENTIAL/PLATELET
Basophils Absolute: 0 10*3/uL (ref 0.0–0.1)
Basophils Relative: 1 % (ref 0–1)
Eosinophils Absolute: 0.2 10*3/uL (ref 0.0–0.7)
Eosinophils Relative: 4 % (ref 0–5)
HCT: 40.8 % (ref 39.0–52.0)
Hemoglobin: 14.5 g/dL (ref 13.0–17.0)
Lymphocytes Relative: 31 % (ref 12–46)
Lymphs Abs: 1.5 10*3/uL (ref 0.7–4.0)
MCH: 32.1 pg (ref 26.0–34.0)
MCHC: 35.5 g/dL (ref 30.0–36.0)
MCV: 90.3 fL (ref 78.0–100.0)
MPV: 12.3 fL (ref 8.6–12.4)
Monocytes Absolute: 0.6 10*3/uL (ref 0.1–1.0)
Monocytes Relative: 12 % (ref 3–12)
Neutro Abs: 2.4 10*3/uL (ref 1.7–7.7)
Neutrophils Relative %: 52 % (ref 43–77)
Platelets: 68 10*3/uL — ABNORMAL LOW (ref 150–400)
RBC: 4.52 MIL/uL (ref 4.22–5.81)
RDW: 15.3 % (ref 11.5–15.5)
WBC: 4.7 10*3/uL (ref 4.0–10.5)

## 2015-02-22 LAB — COMPLETE METABOLIC PANEL WITH GFR
ALT: 45 U/L (ref 9–46)
AST: 51 U/L — ABNORMAL HIGH (ref 10–35)
Albumin: 3.8 g/dL (ref 3.6–5.1)
Alkaline Phosphatase: 115 U/L (ref 40–115)
BUN: 6 mg/dL — ABNORMAL LOW (ref 7–25)
CO2: 20 mmol/L (ref 20–31)
Calcium: 8.6 mg/dL (ref 8.6–10.3)
Chloride: 107 mmol/L (ref 98–110)
Creat: 0.79 mg/dL (ref 0.70–1.25)
GFR, Est African American: >89 mL/min (ref >=60)
GFR, Est Non African American: >89 mL/min (ref >=60)
Glucose, Bld: 88 mg/dL (ref 65–99)
Potassium: 3.9 mmol/L (ref 3.5–5.3)
Sodium: 140 mmol/L (ref 135–146)
Total Bilirubin: 1.1 mg/dL (ref 0.2–1.2)
Total Protein: 6.5 g/dL (ref 6.1–8.1)

## 2015-02-23 ENCOUNTER — Telehealth: Payer: Self-pay | Admitting: Family Medicine

## 2015-02-23 ENCOUNTER — Other Ambulatory Visit: Payer: Self-pay | Admitting: Physician Assistant

## 2015-02-23 DIAGNOSIS — Z139 Encounter for screening, unspecified: Secondary | ICD-10-CM

## 2015-02-23 LAB — HEMOGLOBIN A1C
Hgb A1c MFr Bld: 5.2 % (ref <5.7)
Mean Plasma Glucose: 103 mg/dL (ref <117)

## 2015-02-23 LAB — HIV ANTIBODY (ROUTINE TESTING W REFLEX): HIV 1&2 Ab, 4th Generation: NONREACTIVE

## 2015-02-23 NOTE — Telephone Encounter (Signed)
Called [facility] to add onto patient labs HIV & A1C

## 2015-02-23 NOTE — Progress Notes (Signed)
Spoke with Ms. Allision NP at Dr. Mila Palmer office.  Reports his heart medications are 2.5 mg Lisinopril qd,  Coreg 3.125 qd, Spironolactone 25 mg qd, and Lasix 20 mg prn qd.  Will check a BMET, CBC, HIV, A1c.  [name], MS, PA-C   1:06 PM, 02/23/2015

## 2015-03-01 ENCOUNTER — Telehealth: Payer: Self-pay

## 2015-03-01 NOTE — Telephone Encounter (Signed)
Pt wants [name] to know he has an appointment with his psychiatrist mid November. He would like a medication for his anxiety until he has his appointment. Please advise at [phone]

## 2015-03-02 NOTE — Telephone Encounter (Signed)
Psychiatric illness: He is to see a psychiatrist in the next two weeks. Given his history of bipolar I am not comfortable with managing depression, as SSRI therapy would increase the likely hood of mania. Will follow this problem per psych reqs. Caution advised with benzodiazepines given history of drug abuse, of which patient is very candid.   Called pt to let him know. He states he will just get it off the street. Carolynne Edouard

## 2015-03-04 ENCOUNTER — Other Ambulatory Visit: Payer: Self-pay | Admitting: Physician Assistant

## 2015-03-04 DIAGNOSIS — Z79899 Other long term (current) drug therapy: Secondary | ICD-10-CM

## 2015-03-04 MED ORDER — SPIRONOLACTONE 25 MG PO TABS: 25.0000 mg | ORAL_TABLET | Freq: Every day | ORAL | Status: DC

## 2015-03-04 MED ORDER — CARVEDILOL 3.125 MG PO TABS: 3.1250 mg | ORAL_TABLET | Freq: Two times a day (BID) | ORAL | Status: DC

## 2015-03-04 MED ORDER — HYDROXYZINE HCL 10 MG PO TABS: 10.0000 mg | ORAL_TABLET | Freq: Three times a day (TID) | ORAL | Status: DC | PRN

## 2015-03-04 MED ORDER — LISINOPRIL 2.5 MG PO TABS: 2.5000 mg | ORAL_TABLET | Freq: Every day | ORAL | Status: DC

## 2015-03-04 MED ORDER — FUROSEMIDE 20 MG PO TABS
20.0000 mg | ORAL_TABLET | ORAL | Status: DC | PRN
Start: 2015-03-04 — End: 2016-04-15

## 2015-03-04 MED ORDER — ASPIRIN EC 81 MG PO TBEC: 81.0000 mg | DELAYED_RELEASE_TABLET | Freq: Every morning | ORAL | Status: DC

## 2015-03-04 MED ORDER — BUSPIRONE HCL 15 MG PO TABS: 15.0000 mg | ORAL_TABLET | Freq: Three times a day (TID) | ORAL | Status: DC | PRN

## 2015-03-04 NOTE — Telephone Encounter (Signed)
Spoke with patient.  Increasing Trazadone 150 to aid with sleep.  Adding Buspar and Atarax for sleep.  BZD avoided given history of polysubstance abuse.  [name], MS, PA-C   9:07 AM, 03/04/2015

## 2015-03-07 ENCOUNTER — Other Ambulatory Visit: Payer: Self-pay | Admitting: Physician Assistant

## 2015-03-07 ENCOUNTER — Ambulatory Visit (INDEPENDENT_AMBULATORY_CARE_PROVIDER_SITE_OTHER): Payer: Medicare Other | Admitting: Physician Assistant

## 2015-03-07 ENCOUNTER — Encounter: Payer: Self-pay | Admitting: Physician Assistant

## 2015-03-07 VITALS — BP 98/65 | HR 54 | Temp 97.5°F | Resp 16 | Ht 68.0 in | Wt 220.0 lb

## 2015-03-07 DIAGNOSIS — Z139 Encounter for screening, unspecified: Secondary | ICD-10-CM

## 2015-03-07 DIAGNOSIS — K759 Inflammatory liver disease, unspecified: Secondary | ICD-10-CM

## 2015-03-07 DIAGNOSIS — Z79899 Other long term (current) drug therapy: Secondary | ICD-10-CM

## 2015-03-07 LAB — COMPLETE METABOLIC PANEL WITH GFR
ALT: 58 U/L — ABNORMAL HIGH (ref 9–46)
AST: 56 U/L — ABNORMAL HIGH (ref 10–35)
Albumin: 4.2 g/dL (ref 3.6–5.1)
Alkaline Phosphatase: 114 U/L (ref 40–115)
BUN: 14 mg/dL (ref 7–25)
CO2: 24 mmol/L (ref 20–31)
Calcium: 9.1 mg/dL (ref 8.6–10.3)
Chloride: 106 mmol/L (ref 98–110)
Creat: 0.93 mg/dL (ref 0.70–1.25)
GFR, Est African American: >89 mL/min (ref >=60)
GFR, Est Non African American: 89 mL/min (ref >=60)
Glucose, Bld: 81 mg/dL (ref 65–99)
Potassium: 4.4 mmol/L (ref 3.5–5.3)
Sodium: 138 mmol/L (ref 135–146)
Total Bilirubin: 0.8 mg/dL (ref 0.2–1.2)
Total Protein: 7 g/dL (ref 6.1–8.1)

## 2015-03-07 LAB — BASIC METABOLIC PANEL
BUN: 14 mg/dL (ref 7–25)
CO2: 24 mmol/L (ref 20–31)
Calcium: 9.1 mg/dL (ref 8.6–10.3)
Chloride: 106 mmol/L (ref 98–110)
Creat: 0.93 mg/dL (ref 0.70–1.25)
Glucose, Bld: 81 mg/dL (ref 65–99)
Potassium: 4.4 mmol/L (ref 3.5–5.3)
Sodium: 138 mmol/L (ref 135–146)
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
## 2015-03-08 ENCOUNTER — Ambulatory Visit (HOSPITAL_COMMUNITY): Payer: 59 | Admitting: Psychiatry

## 2015-03-08 ENCOUNTER — Encounter (HOSPITAL_COMMUNITY): Payer: Self-pay | Admitting: Psychiatry

## 2015-03-08 LAB — CBC WITH DIFFERENTIAL/PLATELET
Basophils Absolute: 0 10*3/uL (ref 0.0–0.1)
Basophils Relative: 1 % (ref 0–1)
Eosinophils Absolute: 0.1 10*3/uL (ref 0.0–0.7)
Eosinophils Relative: 3 % (ref 0–5)
HCT: 46.4 % (ref 39.0–52.0)
Hemoglobin: 15.7 g/dL (ref 13.0–17.0)
Lymphocytes Relative: 33 % (ref 12–46)
Lymphs Abs: 1.6 10*3/uL (ref 0.7–4.0)
MCH: 31.2 pg (ref 26.0–34.0)
MCHC: 33.8 g/dL (ref 30.0–36.0)
MCV: 92.1 fL (ref 78.0–100.0)
MPV: 12.1 fL (ref 8.6–12.4)
Monocytes Absolute: 0.6 10*3/uL (ref 0.1–1.0)
Monocytes Relative: 13 % — ABNORMAL HIGH (ref 3–12)
Neutro Abs: 2.4 10*3/uL (ref 1.7–7.7)
Neutrophils Relative %: 50 % (ref 43–77)
Platelets: 78 10*3/uL — ABNORMAL LOW (ref 150–400)
RBC: 5.04 MIL/uL (ref 4.22–5.81)
RDW: 15.4 % (ref 11.5–15.5)
WBC: 4.7 10*3/uL (ref 4.0–10.5)

## 2015-03-08 LAB — HEPATITIS B SURFACE ANTIGEN: Hepatitis B Surface Ag: NEGATIVE

## 2015-03-08 LAB — HIV ANTIBODY (ROUTINE TESTING W REFLEX): HIV 1&2 Ab, 4th Generation: NONREACTIVE

## 2015-03-08 LAB — HEPATITIS B SURFACE ANTIBODY, QUANTITATIVE: Hepatitis B-Post: 1.6 m[IU]/mL

## 2015-03-08 LAB — HEPATITIS C ANTIBODY: HCV Ab: REACTIVE — AB

## 2015-03-08 LAB — HEMOGLOBIN A1C
Hgb A1c MFr Bld: 5.3 % (ref <5.7)
Mean Plasma Glucose: 105 mg/dL (ref <117)

## 2015-03-09 ENCOUNTER — Telehealth: Payer: Self-pay

## 2015-03-09 LAB — HEPATITIS C RNA QUANTITATIVE
HCV Quantitative Log: 5.52 {Log} — ABNORMAL HIGH (ref <1.18)
HCV Quantitative: 333032 IU/mL — ABNORMAL HIGH (ref <15)

## 2015-03-09 NOTE — Telephone Encounter (Signed)
Pt was seen by [name] on [phone] and was referred to behavioral health for physiatric care. Pt went there yesterday and was there for four hours before being referred to [facility] in [place] because they said they couldn't help him. He then went to [facility] and was there for three hours before being referred to other offices in [place] because they said they could not help him, also. Pt and his sisters are upset because they've been to several offices that won't help him with his physiatric concerns. Pt wants someone in [place] to treat him for depression/poss bipolar disorder and will prescribe him the necessary medications.

## 2015-03-12 ENCOUNTER — Other Ambulatory Visit: Payer: Self-pay | Admitting: Physician Assistant

## 2015-03-12 DIAGNOSIS — F99 Mental disorder, not otherwise specified: Secondary | ICD-10-CM

## 2015-03-12 NOTE — Telephone Encounter (Signed)
Patient's sister called to follow up on previous phone message documented on 03/09/2015. She states that her brother is getting worse physically and emotionally. Please call patient's sister Neoma Laming [phone]

## 2015-03-12 NOTE — Telephone Encounter (Signed)
Please advise that I have referred patient to [facility].  They should be able to help him. I wish there were more that we could do.

## 2015-03-13 NOTE — Telephone Encounter (Signed)
Called and spoke with the people at [facility] and asked them who they would possibly recommend to send this pt.  She advised me to call [facility].  I called [facility] and this place does psychiatric services and prescribe medications.  Called sister Neoma Laming and advised her that they are to walk-in anytime between 8-3 M-F, the first assessment will take about 3 hours and from there on they will do appts.    [facility], [place] [phone]

## 2015-03-13 NOTE — Telephone Encounter (Signed)
PT patient's sister Neoma Laming [phone]/// Informed her that [facility] has received referral and transferred call for next steps///

## 2015-03-13 NOTE — Telephone Encounter (Signed)
This is great.  Thank you for your help Sheketia. [name], MS, PA-C   7:01 PM, 03/13/2015

## 2015-03-23 DIAGNOSIS — B182 Chronic viral hepatitis C: Secondary | ICD-10-CM | POA: Diagnosis not present

## 2015-03-23 DIAGNOSIS — I2510 Atherosclerotic heart disease of native coronary artery without angina pectoris: Secondary | ICD-10-CM | POA: Diagnosis not present

## 2015-03-23 DIAGNOSIS — E7800 Pure hypercholesterolemia, unspecified: Secondary | ICD-10-CM | POA: Diagnosis not present

## 2015-03-23 DIAGNOSIS — I5032 Chronic diastolic (congestive) heart failure: Secondary | ICD-10-CM | POA: Diagnosis not present

## 2015-03-28 DIAGNOSIS — E7800 Pure hypercholesterolemia, unspecified: Secondary | ICD-10-CM | POA: Diagnosis not present

## 2015-03-28 DIAGNOSIS — E7801 Familial hypercholesterolemia: Secondary | ICD-10-CM | POA: Diagnosis not present

## 2015-04-17 DIAGNOSIS — K7210 Chronic hepatic failure without coma: Secondary | ICD-10-CM | POA: Diagnosis not present

## 2015-04-18 ENCOUNTER — Ambulatory Visit (HOSPITAL_COMMUNITY): Payer: Medicaid Other | Admitting: Psychiatry

## 2015-04-18 ENCOUNTER — Other Ambulatory Visit: Payer: Self-pay

## 2015-04-18 ENCOUNTER — Other Ambulatory Visit: Payer: Self-pay | Admitting: Physician Assistant

## 2015-04-18 ENCOUNTER — Ambulatory Visit: Payer: Medicare Other | Admitting: Physician Assistant

## 2015-04-18 MED ORDER — SPIRONOLACTONE 25 MG PO TABS: 25.0000 mg | ORAL_TABLET | Freq: Every day | ORAL | Status: DC

## 2015-04-18 NOTE — Telephone Encounter (Signed)
Informed patient that his medication had been called in for him. Pt. Verbalized understanding.

## 2015-04-18 NOTE — Telephone Encounter (Signed)
Patient came into the clinic requesting medication refills. He stated that he needed the following: atorvastatin 10mg  and spironoactone 25mg . Patient uses [facility].   Call back number is [phone]

## 2015-04-24 ENCOUNTER — Ambulatory Visit: Payer: Medicare Other | Admitting: Physician Assistant

## 2015-05-24 ENCOUNTER — Telehealth: Payer: Self-pay

## 2015-05-24 NOTE — Telephone Encounter (Signed)
PA completed for hydroxyzine on covermymeds. Pending. Pt is being treated with this for anxiety and can not be Rxd benzos d/t hx of polysubstance abuse.  Pt is also being Rxd Buspar.

## 2015-05-25 NOTE — Telephone Encounter (Signed)
Received notice from ins that PA is not needed for hydroxyzine, it is covered. I faxed notice to pharmacy.

## 2015-05-30 ENCOUNTER — Encounter: Payer: Self-pay | Admitting: Physician Assistant

## 2015-05-30 ENCOUNTER — Ambulatory Visit (INDEPENDENT_AMBULATORY_CARE_PROVIDER_SITE_OTHER): Payer: Medicare Other | Admitting: Physician Assistant

## 2015-05-30 VITALS — BP 90/62 | HR 59 | Temp 98.4°F | Resp 16 | Ht 69.0 in | Wt 214.0 lb

## 2015-05-30 DIAGNOSIS — M549 Dorsalgia, unspecified: Secondary | ICD-10-CM

## 2015-05-30 DIAGNOSIS — G8929 Other chronic pain: Secondary | ICD-10-CM

## 2015-05-30 DIAGNOSIS — Z09 Encounter for follow-up examination after completed treatment for conditions other than malignant neoplasm: Secondary | ICD-10-CM | POA: Diagnosis not present

## 2015-05-30 DIAGNOSIS — B182 Chronic viral hepatitis C: Secondary | ICD-10-CM

## 2015-05-30 LAB — COMPLETE METABOLIC PANEL WITH GFR
ALT: 59 U/L — ABNORMAL HIGH (ref 9–46)
AST: 54 U/L — ABNORMAL HIGH (ref 10–35)
Albumin: 3.9 g/dL (ref 3.6–5.1)
Alkaline Phosphatase: 86 U/L (ref 40–115)
BUN: 6 mg/dL — ABNORMAL LOW (ref 7–25)
CO2: 24 mmol/L (ref 20–31)
Calcium: 8.7 mg/dL (ref 8.6–10.3)
Chloride: 107 mmol/L (ref 98–110)
Creat: 0.88 mg/dL (ref 0.70–1.25)
GFR, Est African American: >89 mL/min (ref >=60)
GFR, Est Non African American: >89 mL/min (ref >=60)
Glucose, Bld: 65 mg/dL (ref 65–99)
Potassium: 3.7 mmol/L (ref 3.5–5.3)
Sodium: 140 mmol/L (ref 135–146)
Total Bilirubin: 0.9 mg/dL (ref 0.2–1.2)
Total Protein: 6.3 g/dL (ref 6.1–8.1)

## 2015-05-30 LAB — TSH: TSH: 0.96 u[IU]/mL (ref 0.350–4.500)
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
## 2015-05-31 LAB — CBC
HCT: 43.5 % (ref 39.0–52.0)
Hemoglobin: 15.1 g/dL (ref 13.0–17.0)
MCH: 31 pg (ref 26.0–34.0)
MCHC: 34.7 g/dL (ref 30.0–36.0)
MCV: 89.3 fL (ref 78.0–100.0)
MPV: 12.1 fL (ref 8.6–12.4)
Platelets: 71 10*3/uL — ABNORMAL LOW (ref 150–400)
RBC: 4.87 MIL/uL (ref 4.22–5.81)
RDW: 14.9 % (ref 11.5–15.5)
WBC: 5.5 10*3/uL (ref 4.0–10.5)

## 2015-06-06 ENCOUNTER — Other Ambulatory Visit: Payer: Medicaid Other

## 2015-06-06 DIAGNOSIS — B182 Chronic viral hepatitis C: Secondary | ICD-10-CM | POA: Diagnosis not present

## 2015-06-06 LAB — IRON: Iron: 134 ug/dL (ref 50–180)

## 2015-06-07 LAB — PROTIME-INR
INR: 1.17 (ref <1.50)
Prothrombin Time: 15.1 seconds (ref 11.6–15.2)

## 2015-06-07 LAB — ANA: Anti Nuclear Antibody(ANA): NEGATIVE

## 2015-06-07 LAB — HEPATITIS A ANTIBODY, TOTAL: Hep A Total Ab: NONREACTIVE

## 2015-06-07 LAB — HEPATITIS B CORE ANTIBODY, TOTAL: Hep B Core Total Ab: REACTIVE — AB

## 2015-06-11 LAB — HCV RNA,LIPA RFLX NS5A DRUG RESIST

## 2015-06-11 NOTE — Telephone Encounter (Signed)
[name] gave me back form that he completed for PA and I faxed it again, but it looks like PA was not needed. Will see response sent this time.

## 2015-06-12 NOTE — Telephone Encounter (Signed)
PA was approved this time through 06/15/16. Notified pharm.

## 2015-06-14 DIAGNOSIS — G8929 Other chronic pain: Secondary | ICD-10-CM | POA: Diagnosis not present

## 2015-06-14 DIAGNOSIS — M5442 Lumbago with sciatica, left side: Secondary | ICD-10-CM | POA: Diagnosis not present

## 2015-06-20 ENCOUNTER — Ambulatory Visit (INDEPENDENT_AMBULATORY_CARE_PROVIDER_SITE_OTHER): Payer: Medicare Other | Admitting: Internal Medicine

## 2015-06-20 ENCOUNTER — Encounter: Payer: Self-pay | Admitting: Internal Medicine

## 2015-06-20 VITALS — BP 96/63 | HR 57 | Temp 97.6°F | Ht 68.0 in | Wt 215.0 lb

## 2015-06-20 DIAGNOSIS — B182 Chronic viral hepatitis C: Secondary | ICD-10-CM | POA: Insufficient documentation

## 2015-06-20 MED ORDER — LEDIPASVIR-SOFOSBUVIR 90-400 MG PO TABS: 1.0000 | ORAL_TABLET | Freq: Every day | ORAL | Status: DC

## 2015-06-20 NOTE — Patient Instructions (Signed)
Date 06/20/2015  Dear Mr. Skahan, As discussed in the [facility], your hepatitis C therapy will include the following medications:          Harvoni 90mg /400mg  tablet:           Take 1 tablet by mouth once daily   Please note that ALL MEDICATIONS WILL START ON THE SAME DATE for a total of 12 weeks. ---------------------------------------------------------------- Your HCV Treatment Start Date: TBA   Your HCV genotype:  1b    Liver Fibrosis: TBD    ---------------------------------------------------------------- YOUR PHARMACY CONTACT:   [place] [facility] [place] Phone: [phone] Hours: Monday to Friday 7:30 am to 6:00 pm   Please always contact your pharmacy at least 3-4 business days before you run out of medications to ensure your next month's medication is ready or 1 week prior to running out if you receive it by mail.  Remember, each prescription is for 28 days. ---------------------------------------------------------------- GENERAL NOTES REGARDING YOUR HEPATITIS C MEDICATION:  SOFOSBUVIR/LEDIPASVIR (HARVONI): - Harvoni tablet is taken daily with OR without food. - The tablets are orange. - The tablets should be stored at room temperature.  - Acid reducing agents such as H2 blockers (ie. Pepcid (famotidine), Zantac (ranitidine), Tagamet (cimetidine), Axid (nizatidine) and proton pump inhibitors (ie. Prilosec (omeprazole), Protonix (pantoprazole), Nexium (esomeprazole), or Aciphex (rabeprazole)) can decrease effectiveness of Harvoni. Do not take until you have discussed with a health care provider.    -Antacids that contain magnesium and/or aluminum hydroxide (ie. Milk of Magensia, Rolaids, Gaviscon, Maalox, Mylanta, an dArthritis Pain Formula)can reduce absorption of Harvoni, so take them at least 4 hours before or after Harvoni.  -Calcium carbonate (calcium supplements or antacids such as Tums, Caltrate,  Os-Cal)needs to be taken at least 4 hours hours before or after Harvoni.  -St. John's wort or any products that contain St. John's wort like some herbal supplements  Please inform the office prior to starting any of these medications.  - The common side effects associated with Harvoni include:      1. Fatigue      2. Headache      3. Nausea      4. Diarrhea      5. Insomnia  Please note that this only lists the most common side effects and is NOT a comprehensive list of the potential side effects of these medications. For more information, please review the drug information sheets that come with your medication package from the pharmacy.  ---------------------------------------------------------------- GENERAL HELPFUL HINTS ON HCV THERAPY: 1. Stay well-hydrated. 2. Notify the [facility] of any changes in your other over-the-counter/herbal or prescription medications. 3. If you miss a dose of your medication, take the missed dose as soon as you remember. Return to your regular time/dose schedule the next day.  4.  Do not stop taking your medications without first talking with your healthcare provider. 5.  You may take Tylenol (acetaminophen), as long as the dose is less than 2000 mg (OR no more than 4 tablets of the Tylenol Extra Strengths 500mg  tablet) in 24 hours. 6.  You will see our pharmacist-specialist within the first 2 weeks of starting your medication. 7.  You will need to obtain routine labs around week 4 and12 weeks after starting and then 3 to 6 months after finishing Harvoni.    Scharlene Gloss, [place] [facility] [place],  [place]  [phone]
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
## 2015-06-21 DIAGNOSIS — G8929 Other chronic pain: Secondary | ICD-10-CM | POA: Diagnosis not present

## 2015-06-21 DIAGNOSIS — M5442 Lumbago with sciatica, left side: Secondary | ICD-10-CM | POA: Diagnosis not present

## 2015-06-30 ENCOUNTER — Other Ambulatory Visit: Payer: Self-pay | Admitting: Physician Assistant

## 2015-07-02 ENCOUNTER — Other Ambulatory Visit: Payer: Self-pay | Admitting: Physician Assistant

## 2015-07-04 ENCOUNTER — Other Ambulatory Visit: Payer: Self-pay | Admitting: Physician Assistant

## 2015-07-04 ENCOUNTER — Telehealth: Payer: Self-pay

## 2015-07-04 DIAGNOSIS — M5442 Lumbago with sciatica, left side: Secondary | ICD-10-CM | POA: Diagnosis not present

## 2015-07-04 DIAGNOSIS — G8929 Other chronic pain: Secondary | ICD-10-CM | POA: Diagnosis not present

## 2015-07-04 DIAGNOSIS — F4323 Adjustment disorder with mixed anxiety and depressed mood: Secondary | ICD-10-CM

## 2015-07-04 MED ORDER — HYDROXYZINE HCL 10 MG PO TABS: 10.0000 mg | ORAL_TABLET | Freq: Three times a day (TID) | ORAL | Status: DC | PRN

## 2015-07-04 NOTE — Telephone Encounter (Signed)
Pt is checking on a request  For refill on pain meds

## 2015-07-05 NOTE — Telephone Encounter (Signed)
Spoke with pt, advised Rx sent yesterday. Atarax prescription.

## 2015-07-11 DIAGNOSIS — M5442 Lumbago with sciatica, left side: Secondary | ICD-10-CM | POA: Diagnosis not present

## 2015-07-11 DIAGNOSIS — G8929 Other chronic pain: Secondary | ICD-10-CM | POA: Diagnosis not present

## 2015-07-18 ENCOUNTER — Ambulatory Visit (HOSPITAL_COMMUNITY)
Admission: RE | Admit: 2015-07-18 | Discharge: 2015-07-18 | Disposition: A | Discharge status: Home / Self Care | Payer: Medicare Other | Source: Ambulatory Visit | Attending: Internal Medicine | Admitting: Internal Medicine

## 2015-07-18 DIAGNOSIS — I10 Essential (primary) hypertension: Secondary | ICD-10-CM | POA: Insufficient documentation

## 2015-07-18 DIAGNOSIS — R161 Splenomegaly, not elsewhere classified: Secondary | ICD-10-CM | POA: Diagnosis not present

## 2015-07-18 DIAGNOSIS — B182 Chronic viral hepatitis C: Secondary | ICD-10-CM | POA: Diagnosis not present

## 2015-07-18 DIAGNOSIS — K8020 Calculus of gallbladder without cholecystitis without obstruction: Secondary | ICD-10-CM | POA: Insufficient documentation

## 2015-07-25 MED FILL — *HARVONI 90-400 MG TABLET: 90-400 | 28 days supply | Qty: 28 | Fill #0

## 2015-07-26 ENCOUNTER — Other Ambulatory Visit: Payer: Self-pay | Admitting: Pharmacist Clinician (PhC)/ Clinical Pharmacy Specialist

## 2015-07-26 ENCOUNTER — Encounter: Payer: Self-pay | Admitting: Pharmacy Technician

## 2015-07-26 MED ORDER — LEDIPASVIR-SOFOSBUVIR 90-400 MG PO TABS: 1.0000 | ORAL_TABLET | Freq: Every day | ORAL | Status: DC

## 2015-07-31 ENCOUNTER — Other Ambulatory Visit: Payer: Self-pay | Admitting: Physician Assistant

## 2015-08-16 MED FILL — *HARVONI 90-400 MG TABLET: 90-400 | 28 days supply | Qty: 28 | Fill #1

## 2015-08-20 ENCOUNTER — Ambulatory Visit (INDEPENDENT_AMBULATORY_CARE_PROVIDER_SITE_OTHER): Payer: Medicare Other | Admitting: Pharmacist Clinician (PhC)/ Clinical Pharmacy Specialist

## 2015-08-20 ENCOUNTER — Other Ambulatory Visit: Payer: Medicare Other

## 2015-08-20 ENCOUNTER — Ambulatory Visit: Payer: Self-pay

## 2015-08-20 DIAGNOSIS — B182 Chronic viral hepatitis C: Secondary | ICD-10-CM
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
## 2015-08-21 LAB — HEPATITIS C RNA QUANTITATIVE: HCV Quantitative: NOT DETECTED IU/mL (ref <15)

## 2015-09-05 DIAGNOSIS — H2513 Age-related nuclear cataract, bilateral: Secondary | ICD-10-CM | POA: Diagnosis not present

## 2015-09-12 MED FILL — *HARVONI 90-400 MG TABLET: 90-400 | 28 days supply | Qty: 28 | Fill #2

## 2015-09-13 ENCOUNTER — Telehealth: Payer: Self-pay | Admitting: Pharmacist Clinician (PhC)/ Clinical Pharmacy Specialist

## 2015-09-13 NOTE — Telephone Encounter (Signed)
[name] called about getting his 3rd month of Harvoni. He lives in [place] and have driver license issue. Janit Bern will mail it to him today.

## 2015-09-19 DIAGNOSIS — B182 Chronic viral hepatitis C: Secondary | ICD-10-CM | POA: Diagnosis not present

## 2015-09-19 DIAGNOSIS — I5032 Chronic diastolic (congestive) heart failure: Secondary | ICD-10-CM | POA: Diagnosis not present

## 2015-09-19 DIAGNOSIS — E7800 Pure hypercholesterolemia, unspecified: Secondary | ICD-10-CM | POA: Diagnosis not present

## 2015-09-19 DIAGNOSIS — I2510 Atherosclerotic heart disease of native coronary artery without angina pectoris: Secondary | ICD-10-CM | POA: Diagnosis not present

## 2015-10-03 ENCOUNTER — Ambulatory Visit: Payer: Medicare Other | Admitting: Physician Assistant

## 2015-10-16 ENCOUNTER — Ambulatory Visit (INDEPENDENT_AMBULATORY_CARE_PROVIDER_SITE_OTHER): Payer: Medicare Other | Admitting: Internal Medicine

## 2015-10-16 VITALS — BP 109/75 | HR 53 | Temp 97.3°F | Wt 207.0 lb

## 2015-10-16 DIAGNOSIS — K7460 Unspecified cirrhosis of liver: Secondary | ICD-10-CM | POA: Insufficient documentation

## 2015-10-16 DIAGNOSIS — B182 Chronic viral hepatitis C: Secondary | ICD-10-CM

## 2015-10-16 LAB — COMPLETE METABOLIC PANEL WITH GFR
ALT: 30 U/L (ref 9–46)
AST: 30 U/L (ref 10–35)
Albumin: 4.2 g/dL (ref 3.6–5.1)
Alkaline Phosphatase: 65 U/L (ref 40–115)
BUN: 11 mg/dL (ref 7–25)
CO2: 23 mmol/L (ref 20–31)
Calcium: 8.8 mg/dL (ref 8.6–10.3)
Chloride: 102 mmol/L (ref 98–110)
Creat: 0.87 mg/dL (ref 0.70–1.25)
GFR, Est African American: >89 mL/min (ref >=60)
GFR, Est Non African American: >89 mL/min (ref >=60)
Glucose, Bld: 112 mg/dL — ABNORMAL HIGH (ref 65–99)
Potassium: 3.8 mmol/L (ref 3.5–5.3)
Sodium: 137 mmol/L (ref 135–146)
Total Bilirubin: 0.9 mg/dL (ref 0.2–1.2)
Total Protein: 6.4 g/dL (ref 6.1–8.1)

## 2015-10-16 NOTE — Assessment & Plan Note (Signed)
Doing well.  Will check today and then rtc 3 months for SVR12.

## 2015-10-16 NOTE — Assessment & Plan Note (Signed)
Discussed avoidance of alcohol.  Had pneumovax.  I will refer to GI for ?EGD.   I will check [place] screening next visit and every 6 months.

## 2015-10-16 NOTE — Addendum Note (Signed)
Addended by: Rodman Key A on: 10/16/2015 05:02 PM   Modules accepted: Medications

## 2015-10-16 NOTE — Progress Notes (Signed)
   Subjective:    Patient ID: [name], male    DOB: 17-Oct-1955, 60 y.o.   MRN: [id number]  HPI Here for follow up of HCV.    Has genotype 1b, viral load of 333,000 and F3/4 on elastography.  Started Harvoni about 12 weeks ago and has just 2 pills left.  Early viral load is undetectable.  Has had some fatigue but tolerable.  Has not missed any doses.  No weight loss, no rashes.  Here with his sister.     Review of Systems  Constitutional: Positive for fatigue.  Gastrointestinal: Negative for diarrhea.  Skin: Negative for rash.  Neurological: Negative for dizziness and headaches.       Objective:   Physical Exam  Constitutional: He appears well-developed and well-nourished. No distress.  Eyes: No scleral icterus.  Cardiovascular: Normal rate, regular rhythm and normal heart sounds.   No murmur heard. Pulmonary/Chest: No respiratory distress.  Skin: No rash noted.          Assessment & Plan:

## 2015-10-17 LAB — HEPATITIS C RNA QUANTITATIVE: HCV Quantitative: NOT DETECTED IU/mL (ref <15)

## 2015-10-30 ENCOUNTER — Other Ambulatory Visit: Payer: Self-pay | Admitting: Physician Assistant

## 2015-12-04 ENCOUNTER — Emergency Department
Admission: EM | Admit: 2015-12-04 | Discharge: 2015-12-04 | Disposition: A | Discharge status: Home / Self Care | Payer: Medicare Other | Attending: Emergency Medicine | Admitting: Emergency Medicine

## 2015-12-04 ENCOUNTER — Encounter: Payer: Self-pay | Admitting: Emergency Medicine

## 2015-12-04 ENCOUNTER — Emergency Department: Payer: Medicare Other

## 2015-12-04 DIAGNOSIS — L089 Local infection of the skin and subcutaneous tissue, unspecified: Secondary | ICD-10-CM | POA: Insufficient documentation

## 2015-12-04 DIAGNOSIS — B9689 Other specified bacterial agents as the cause of diseases classified elsewhere: Secondary | ICD-10-CM

## 2015-12-04 DIAGNOSIS — Z7982 Long term (current) use of aspirin: Secondary | ICD-10-CM | POA: Diagnosis not present

## 2015-12-04 DIAGNOSIS — Y999 Unspecified external cause status: Secondary | ICD-10-CM | POA: Insufficient documentation

## 2015-12-04 DIAGNOSIS — Z79899 Other long term (current) drug therapy: Secondary | ICD-10-CM | POA: Insufficient documentation

## 2015-12-04 DIAGNOSIS — I110 Hypertensive heart disease with heart failure: Secondary | ICD-10-CM | POA: Diagnosis not present

## 2015-12-04 DIAGNOSIS — S60811A Abrasion of right wrist, initial encounter: Secondary | ICD-10-CM | POA: Diagnosis not present

## 2015-12-04 DIAGNOSIS — F17210 Nicotine dependence, cigarettes, uncomplicated: Secondary | ICD-10-CM | POA: Insufficient documentation

## 2015-12-04 DIAGNOSIS — M1990 Unspecified osteoarthritis, unspecified site: Secondary | ICD-10-CM | POA: Diagnosis not present

## 2015-12-04 DIAGNOSIS — S60211A Contusion of right wrist, initial encounter: Secondary | ICD-10-CM | POA: Diagnosis not present

## 2015-12-04 DIAGNOSIS — S60812A Abrasion of left wrist, initial encounter: Secondary | ICD-10-CM | POA: Diagnosis not present

## 2015-12-04 DIAGNOSIS — I509 Heart failure, unspecified: Secondary | ICD-10-CM | POA: Insufficient documentation

## 2015-12-04 DIAGNOSIS — Y929 Unspecified place or not applicable: Secondary | ICD-10-CM | POA: Insufficient documentation

## 2015-12-04 DIAGNOSIS — F319 Bipolar disorder, unspecified: Secondary | ICD-10-CM | POA: Diagnosis not present

## 2015-12-04 DIAGNOSIS — W4909XA Other specified item causing external constriction, initial encounter: Secondary | ICD-10-CM | POA: Insufficient documentation

## 2015-12-04 DIAGNOSIS — E785 Hyperlipidemia, unspecified: Secondary | ICD-10-CM | POA: Diagnosis not present

## 2015-12-04 DIAGNOSIS — Y939 Activity, unspecified: Secondary | ICD-10-CM | POA: Diagnosis not present

## 2015-12-04 DIAGNOSIS — Z8669 Personal history of other diseases of the nervous system and sense organs: Secondary | ICD-10-CM | POA: Diagnosis not present

## 2015-12-04 DIAGNOSIS — T07XXXA Unspecified multiple injuries, initial encounter: Secondary | ICD-10-CM

## 2015-12-04 DIAGNOSIS — A499 Bacterial infection, unspecified: Secondary | ICD-10-CM | POA: Diagnosis not present

## 2015-12-04 DIAGNOSIS — M25531 Pain in right wrist: Secondary | ICD-10-CM | POA: Diagnosis present

## 2015-12-04 MED ORDER — BACITRACIN ZINC 500 UNIT/GM EX OINT: TOPICAL_OINTMENT | Freq: Two times a day (BID) | CUTANEOUS | Status: DC

## 2015-12-04 MED ORDER — SULFAMETHOXAZOLE-TRIMETHOPRIM 800-160 MG PO TABS: 1.0000 | ORAL_TABLET | Freq: Two times a day (BID) | ORAL | Status: DC

## 2015-12-04 MED ORDER — NAPROXEN 500 MG PO TABS: 500.0000 mg | ORAL_TABLET | Freq: Two times a day (BID) | ORAL | Status: DC

## 2015-12-04 MED ORDER — BACITRACIN ZINC 500 UNIT/GM EX OINT
TOPICAL_OINTMENT | CUTANEOUS | Status: AC
  Filled 2015-12-04: qty 0.9

## 2015-12-04 MED ORDER — TETANUS-DIPHTH-ACELL PERTUSSIS 5-2.5-18.5 LF-MCG/0.5 IM SUSP
0.5000 mL | Freq: Once | INTRAMUSCULAR | Status: AC
  Administered 2015-12-04: 0.5 mL via INTRAMUSCULAR
  Filled 2015-12-04: qty 0.5

## 2015-12-04 NOTE — Discharge Instructions (Signed)
Restart maintenance medication tomorrow morning.

## 2015-12-04 NOTE — ED Notes (Signed)
Brought in via family  Having pain to both wrists  States he was arrested on Friday and he thinks the cuffs were on too tight  Swelling and abrasion noted to right wrist. And pain to left wrist and some bruising to right upper leg
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
## 2015-12-19 ENCOUNTER — Ambulatory Visit (INDEPENDENT_AMBULATORY_CARE_PROVIDER_SITE_OTHER): Payer: Medicare Other | Admitting: Physician Assistant

## 2015-12-19 VITALS — BP 108/76 | HR 62 | Temp 98.1°F | Resp 18 | Ht 68.0 in | Wt 205.0 lb

## 2015-12-19 DIAGNOSIS — Z23 Encounter for immunization: Secondary | ICD-10-CM | POA: Diagnosis not present

## 2015-12-19 DIAGNOSIS — Z79899 Other long term (current) drug therapy: Secondary | ICD-10-CM

## 2015-12-19 DIAGNOSIS — G8929 Other chronic pain: Secondary | ICD-10-CM

## 2015-12-19 DIAGNOSIS — M549 Dorsalgia, unspecified: Secondary | ICD-10-CM | POA: Diagnosis not present

## 2015-12-19 DIAGNOSIS — Z1211 Encounter for screening for malignant neoplasm of colon: Secondary | ICD-10-CM
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
## 2015-12-19 NOTE — Patient Instructions (Signed)
     IF you received an x-ray today, you will receive an invoice from [facility]. Please contact [facility] at [phone] with questions or concerns regarding your invoice.   IF you received labwork today, you will receive an invoice from [facility]. Please contact [facility] at [phone] with questions or concerns regarding your invoice.   Our billing staff will not be able to assist you with questions regarding bills from these companies.  You will be contacted with the lab results as soon as they are available. The fastest way to get your results is to activate your My Chart account. Instructions are located on the last page of this paperwork. If you have not heard from us regarding the results in 2 weeks, please contact this office.      

## 2015-12-20 ENCOUNTER — Encounter: Payer: Self-pay | Admitting: Gastroenterology

## 2016-01-08 DIAGNOSIS — M545 Low back pain: Secondary | ICD-10-CM | POA: Diagnosis not present

## 2016-01-08 DIAGNOSIS — M542 Cervicalgia: Secondary | ICD-10-CM | POA: Diagnosis not present

## 2016-01-11 ENCOUNTER — Other Ambulatory Visit: Payer: Self-pay

## 2016-01-11 DIAGNOSIS — Z79899 Other long term (current) drug therapy: Secondary | ICD-10-CM

## 2016-01-11 NOTE — Telephone Encounter (Signed)
PATIENT WOULD LIKE THIS MESSAGE TO GO TO [name] - MR. [name] GOES TO [name] AND HE WENT THERE Thursday TO BE SEEN AND THEY SENT HIM HOME. THEN THEY CALLED HIM BACK IN THE AFTERNOON AND TOLD HIM TO COME BACK. HE STATES HE DOES NOT HAVE TRANSPORTATION AND HE COULD NOT COME BACK. HE SAID HE TAKES 4 OR 5 DIFFERENT MEDICATIONS AND HE HAS BEEN OUT FOR A WEEK. HIS NEXT APPOINTMENT WITH [facility] IS AUG. 15 TH. HE WOULD LIKE [name] TO REFILL HIS ZOLOFT, KLONOPIN AND HIS OTHER MEDS. SO HE WON'T HAVE TO GO "[facility] [place]." BEST PHONE [phone]  PHARMACY CHOICE IS [facility].  [place]

## 2016-01-14 MED ORDER — TRINTELLIX 10 MG PO TABS: 1.0000 | ORAL_TABLET | Freq: Every day | ORAL | 0 refills | Status: DC

## 2016-01-14 MED ORDER — DIVALPROEX SODIUM 500 MG PO DR TAB: 500.0000 mg | DELAYED_RELEASE_TABLET | Freq: Every day | ORAL | 0 refills | Status: DC

## 2016-01-14 MED ORDER — TRAZODONE HCL 100 MG PO TABS: 100.0000 mg | ORAL_TABLET | Freq: Every day | ORAL | 0 refills | Status: DC

## 2016-01-14 MED ORDER — QUETIAPINE FUMARATE ER 150 MG PO TB24: 150.0000 mg | ORAL_TABLET | Freq: Every day | ORAL | 0 refills | Status: DC

## 2016-01-14 MED ORDER — TRINTELLIX 5 MG PO TABS: 5.0000 mg | ORAL_TABLET | Freq: Every evening | ORAL | 0 refills | Status: DC | PRN

## 2016-01-14 MED ORDER — SERTRALINE HCL 100 MG PO TABS: 50.0000 mg | ORAL_TABLET | Freq: Every day | ORAL | 0 refills | Status: DC

## 2016-01-14 NOTE — Telephone Encounter (Addendum)
Sorry, pharmacist stated that the Trintellix 5 mg is not covered taking more that 1 per night by ins because it is not meant to be an as needed med. The 5 mg is actually a very low dose and very likely not therapeutic for pt, so if Legrand Como thinks pt needs to take 10 mg Qhs the ins will cover 1 tab of that Qhs, or just one of the 5 mg per night, but won't allow for him to take a second dose if he feels that he needs it. Legrand Como,  Please advise. Pended the 10 mg dose as name brand (same as 5 mg Rx).

## 2016-01-14 NOTE — Telephone Encounter (Signed)
Pt advised of [name]'s message.

## 2016-01-14 NOTE — Addendum Note (Signed)
Addended by: Elwyn Reach A on: 01/14/2016 11:46 AM   Modules accepted: Orders

## 2016-01-14 NOTE — Telephone Encounter (Signed)
Jonelle Sidle,  Thank you for communicating with patient.  I'm happy to help him refill non controlled substances.  His klonopin needs to come from his psychiatrist.  If they sent him home I am sure they will be able to provide refills until he can get back in. We can refill his other meds to get him through but not benzodiazepines.   Micahel

## 2016-01-14 NOTE — Telephone Encounter (Signed)
Pharmacist called and stated that the Trillipex

## 2016-01-14 NOTE — Telephone Encounter (Signed)
Ok I sent in the medications pt needed minus the controlled meds. Pt advised.

## 2016-01-16 ENCOUNTER — Ambulatory Visit (INDEPENDENT_AMBULATORY_CARE_PROVIDER_SITE_OTHER): Payer: Medicare Other | Admitting: Internal Medicine

## 2016-01-16 ENCOUNTER — Telehealth: Payer: Self-pay | Admitting: Gastroenterology

## 2016-01-16 ENCOUNTER — Encounter: Payer: Self-pay | Admitting: Internal Medicine

## 2016-01-16 VITALS — BP 91/63 | HR 53 | Temp 98.0°F | Ht 67.0 in | Wt 205.0 lb

## 2016-01-16 DIAGNOSIS — K7460 Unspecified cirrhosis of liver: Secondary | ICD-10-CM | POA: Diagnosis not present

## 2016-01-16 DIAGNOSIS — B182 Chronic viral hepatitis C: Secondary | ICD-10-CM | POA: Diagnosis not present

## 2016-01-16 DIAGNOSIS — Z23 Encounter for immunization: Secondary | ICD-10-CM

## 2016-01-16 NOTE — Addendum Note (Signed)
Addended by: Thayer Headings on: 01/16/2016 02:13 PM   Modules accepted: Orders

## 2016-01-16 NOTE — Assessment & Plan Note (Signed)
SVR12 for test of cure today and will let him know of results

## 2016-01-16 NOTE — Assessment & Plan Note (Signed)
[place] screening due and will schedule

## 2016-01-16 NOTE — Telephone Encounter (Signed)
Dr Loletha Carrow can we add EGD to the already set up colon?

## 2016-01-16 NOTE — Progress Notes (Signed)
   Subjective:    Patient ID: [name], male    DOB: 29-Jun-1955, 60 y.o.   MRN: [id number]  HPI Here for follow up of HCV.    Has genotype 1b, viral load of 333,000 and F3/4 on elastography.  Completed Harvoni about 12 weeks ago.  Early and end of treatment viral load is undetectable.   Has not missed any doses.  No weight loss, no rashes.  Here with his sister.  No new complaints.    Review of Systems  Constitutional: Negative for fatigue.  Gastrointestinal: Negative for diarrhea.  Skin: Negative for rash.  Neurological: Negative for dizziness and headaches.       Objective:   Physical Exam  Constitutional: He appears well-developed and well-nourished. No distress.  Eyes: No scleral icterus.  Cardiovascular: Normal rate, regular rhythm and normal heart sounds.   No murmur heard. Pulmonary/Chest: No respiratory distress.  Skin: No rash noted.          Assessment & Plan:

## 2016-01-16 NOTE — Addendum Note (Signed)
Addended by: Landis Gandy on: 01/16/2016 02:53 PM   Modules accepted: Orders

## 2016-01-17 ENCOUNTER — Other Ambulatory Visit: Payer: Self-pay | Admitting: Internal Medicine

## 2016-01-17 NOTE — Telephone Encounter (Signed)
Appt for previsit and colon have been cancelled and appt with Dr Loletha Carrow has been scheduled.  The pt and referring have been notified.

## 2016-01-17 NOTE — Telephone Encounter (Signed)
I am glad Dr Linus Salmons brought this to my attention. I reviewed the chart, and the patient's last platelet count was 71K in dec 2016, and his INR was normal at that time.  It is not clear to me that he is up to date on [facility] screening either. He may need his procedures done at the hospital.  For those reasons, please cancel the [facility] preop visit and make this patient a new patient appt to see me.  Thank you  - HD

## 2016-01-18 LAB — HEPATITIS C RNA QUANTITATIVE: HCV Quantitative: NOT DETECTED IU/mL (ref <15)

## 2016-01-25 ENCOUNTER — Ambulatory Visit
Admission: RE | Admit: 2016-01-25 | Discharge: 2016-01-25 | Disposition: A | Discharge status: Home / Self Care | Payer: Medicare Other | Source: Ambulatory Visit | Attending: Internal Medicine | Admitting: Internal Medicine

## 2016-01-25 DIAGNOSIS — K7460 Unspecified cirrhosis of liver: Secondary | ICD-10-CM | POA: Diagnosis not present

## 2016-01-28 ENCOUNTER — Telehealth: Payer: Self-pay | Admitting: *Deleted

## 2016-01-28 ENCOUNTER — Telehealth: Payer: Self-pay

## 2016-01-28 NOTE — Telephone Encounter (Signed)
Patient notified

## 2016-01-28 NOTE — Telephone Encounter (Signed)
-----   Message from Thayer Headings, MD sent at 01/28/2016 12:55 PM EDT ----- Please let him or his sister know that his final HCV viral load remains negative and he is now considered cured.  Thanks

## 2016-01-28 NOTE — Telephone Encounter (Signed)
The patient called to discuss the gastroenterology referral that [name] did for him.  He said he is having trouble getting transportation for his colonoscopy appointment and is interested in possibly doing the version that is mailed.

## 2016-01-28 NOTE — Telephone Encounter (Deleted)
-----   Message from Thayer Headings, MD sent at 01/28/2016 12:55 PM EDT ----- Please let him or his sister know that his final HCV viral load remains negative and he is now considered cured.  Thanks

## 2016-02-04 ENCOUNTER — Other Ambulatory Visit: Payer: Self-pay | Admitting: Physician Assistant

## 2016-02-07 ENCOUNTER — Telehealth: Payer: Self-pay

## 2016-02-07 NOTE — Telephone Encounter (Signed)
THIS MESSAGE IF FOR [name] [facility]: PATIENT WOULD LIKE [name] TO CALL HIM AS SOON AS POSSIBLE REGARDING HIS BACK SITUATION. HE STATES HE IS SO CONFUSED HE DOES NOT KNOW WHAT TO DO NEXT? HE SAID HE WENT TO THE SPECIALIST REGARDING HIS BACK AND THEY DID X-RAYS. THEY WANT TO DO AN MRI, BUT HE HAS HAD A HEART ATTACK BEFORE AND HAS 3 STINTS. THEY WANT TO GET ALL OF HIS MEDICAL RECORDS FROM [facility] TO SEE WHAT KIND OF STINTS HE HAD PLACED. HE SAID THE SPECIALIST CALLED HIM BACK THIS MORNING AND SAID [facility] SENT HIS RECORDS SOME WHERE ELSE? HE JUST NEEDS TO TALK TO [name] BECAUSE THIS IS "REALLY TURNING OUT TO BE A MESS.'' BEST PHONE [phone] (CELL)  [place]

## 2016-02-08 ENCOUNTER — Other Ambulatory Visit: Payer: Self-pay | Admitting: Physician Assistant

## 2016-02-08 NOTE — Telephone Encounter (Signed)
Please call call him with the information. I have reviewed the information and he should continue with orthopedist plan as they doing a good job of trying to help him.

## 2016-02-08 NOTE — Telephone Encounter (Signed)
Patient notified.    He will call and get Legrand Como name on insurance card as PCP.

## 2016-02-08 NOTE — Telephone Encounter (Signed)
Do you just want pt to come in to discuss?

## 2016-02-09 NOTE — Telephone Encounter (Signed)
Patient prescriptions were called in for 1 month, he will need to come in for a follow up with [name] for additional refills.

## 2016-02-11 ENCOUNTER — Ambulatory Visit (INDEPENDENT_AMBULATORY_CARE_PROVIDER_SITE_OTHER): Payer: Medicare Other | Admitting: Physician Assistant

## 2016-02-11 ENCOUNTER — Encounter: Payer: Self-pay | Admitting: Physician Assistant

## 2016-02-11 VITALS — BP 110/70 | HR 54 | Temp 98.1°F | Resp 18 | Ht 68.0 in | Wt 198.4 lb

## 2016-02-11 DIAGNOSIS — M5442 Lumbago with sciatica, left side: Secondary | ICD-10-CM

## 2016-02-11 DIAGNOSIS — M5441 Lumbago with sciatica, right side: Secondary | ICD-10-CM

## 2016-02-11 DIAGNOSIS — G8929 Other chronic pain: Secondary | ICD-10-CM | POA: Diagnosis not present

## 2016-02-11 NOTE — Progress Notes (Signed)
Sat Feb 15, 2005  1:46 AM EDT Patient: [name], [name]   [id number] ______________________________________________________________________________  Cardiology Rpt:  Final    02/15/2005 01:46  Cardiac Cath   [place], [place]  [place], [place] MRN: [id number]  DOB: 30-May-1956  IP Referring Cardiologist: Alphonse Guild, MD Procedure Date: 02/15/2005  [place] #: [id number]  Shiquan Ghanem underwent the following procedures:  Balloon PTCA - primary vessel  Drug eluting stent - single vessel  Left heart catheterization  Left ventriculogram  Starclose  Aortic pressure measurement  Coronary angiogram - left  Coronary angiogram - right  INDICATION(S) FOR ENCOUNTER (*indicates primary reason for catheterization)  * 410.31 - AMI, Inferoposterior wall, initial episode of care  414.01 - Coronary atherosclerosis, of native vessel  ENCOUNTER CATEGORY  Acute evolving MI <= 6 hours  DIAGNOSTIC SUMMARY  Coronary Artery Disease    Left Main:  insignificant    LAD system:  insignificant    LCX system:  total    RCA system:  normal    Number of vessels with significant CAD:  1 - Vessel  Left Ventriculogram    Ejection Fraction:   57%  INTERVENTIONAL SUMMARY  Lesions Attempted:  1    Lesion #1:   LPAV 100%  (pre) to Normal (post)  FINAL DIAGNOSIS  410.31  AMI, Inferoposterior wall, initial episode of care  414.01  Coronary atherosclerosis, of native vessel  ELECTRONIC COPY OF A PREVIOUSLY APPROVED REPORT.    [name], MD, was personally present as the attending physician    throughout the entire procedure.  __________________________________     __________________________________  [name], MD                   [name], MD  BACKGROUND INFORMATION  CARDIOVASCULAR RISK FACTORS/OTHER MEDICAL PROBLEMS  Unlisted/other -- marijuana abuse  SYMPTOM SUMMARY - ANGINA  Documented MI this hospitalization  CURRENT AND PREVIOUS EVENTS  Date         Hospital                         Event  ----------  -------------------------------  -----  02/14/2005  [facility] --  MI                [place], [place]  KILLIP CLASS  I  ALLERGIES  None reported.  PHYSICAL EXAMINATION  Lungs: Rales:  none  Heart:        S2 Splitting: normal     no S3 present     no S4 present  Pulses              Carotid     Femoral     DP          PT  Left        2           2           2           2   Right       2           2           2           2   Pulses - 0 - absent; 1 - diminished; 2 - normal  Bruits - * present  Neurologic exam: alert & oriented x3  LAB VALUES**  Lab  Value  Units   Reference    Date  -------------  -----  ------  -----------  ----------  Hemoglobin *   15.9   g/dL    (12.0-15.5)  02/15/2005  Hematocrit     44     %       (0.35-0.45)  02/15/2005  Platelets      251    X10*9   (150-450)    02/15/2005  Sodium         139.0  mmol/L  (135-145)    02/15/2005  Potassium      3.7    mmol/L  (3.2-4.8)    02/15/2005  Urea Nitrogen  12.0   mg/dL   (7-21)       02/15/2005  Creatinine     0.8    mg/dL   (0.7-1.4)    02/15/2005  Prothrombin    12.5   sec     (11.1-13.0)  02/15/2005  *This lab value is abnormal.  **Lab values were the most recent at the time the report was generated.    Please refer to the medical record for additional notes or updates.  PROCEDURE INFORMATION  PREMEDICATION, SEDATION AND ANESTHESIA  Medication                   Dose        Route  Interval  Comment  ---------------------------  ----------  -----  --------  -------  Pre-existing Medications:    HEPARIN SOLUTION           800 units   drip   /hr    NTG IV                     40 mcg      drip   /min    OXYGEN                     2 L         nasal  /min  Premedication & Anesthesia:    1%  XYLOCAINE              10 ml       sq     once  Contrast Log:    ISOVUE  INJECTION          200 ml             total     [id number]  Procedure  Medications:    BENADRYL                   25 mg       iv     once    DILAUDID                   0.5 mg      iv     once    VERSED INJECTION           0.5 mg      iv     once    HEPARIN INJECTION          1500 units  ia     once    REOPRO                     23.6 mg     iv     once    REOPRO  10 mcg      drip   /min    ATROPINE                   1 mg        iv     once    EPINEPHRINE                1 mg        iv     once    OXYGEN                     4 L         nasal  /min    NTG IC                     50 mcg      ic     once    1%  XYLOCAINE WITH         7 ml        sq     once      EPINEPHRINE    PLAVIX                     600 mg      po     once    NORMAL SALINE              950 ml      iv     once  PROCEDURE DATA AND CONDITION AT COMPLETION OF STUDY  Time arrived in lab: 01:46             Departure time from lab: 03:01  Location prior to procedure: CCU       Returned to patient's room    [id number])  Consent signed: Yes   Sedation consent: Yes  Final patient condition: Stable  ACCESS SITES  Right femoral artery (6 fr sheath)  ACUTE MI DATA  Time of symptom onset: 02/14/2005 20:30:00  Time of first angiographic reperfusion: 02/15/2005 02:16:00  Pain grade upon patient arrival in lab: 5/10  Pain grade at end of encounter: no grade  IRA Reperfusion: after guidewire or PTCA  OXIMETRY, CARDIAC OUTPUT, AND CALCULATED DATA  Height: 172.7 cm   Weight: 93.7 kg    Body Surface Area: 2.07                                          m*2  Heart rate: 64   Blood pressure: 132/92  HEMODYNAMIC AND VALVE DATA  Run-Site              Systolic      Diastolic     Mean  --------------------  ------------  ------------  ------------  Baseline    LV                  91            15    Ao                  101           77            89  LEFT VENTRICULAR CINEANGIOGRAM  Mitral Apparatus:  Normal  Method:  Single plane  Ejection Fraction:   57%  Overall Contraction:  Normal   Contraction Pattern:  R  Anterior  Normal  A  Apical    Normal  O  Inferior  Mild Akinesis  CORONARY ARTERIOGRAMS  Dominance: left  SA node origin: right  AV node origin: right  Branch                    Stenosis         Lesion Type  TIMI Flow  ------------------------  ---------------  -----------  ---------  Left Main    L Ostium                25%  extending   Tubular                              to L Main  Left Circumflex    OM1  (Large)    OM2  (Large)    OM3  (Small)    LPL1  (Small)    LPL2                                                  TIMI 0    LPL3                                                  TIMI 0    *LPAV                   100%  extending  Tubular      TIMI 0                              to Dist LCX    L PDA                                                 TIMI 0    L AV Node                                             TIMI 0    L PDA Septal                                          TIMI 0  Left Anterior Descending    Mid LAD                 25%              Tubular    D1  (Large)             25%              Tubular    D2  (Small)    D3  (Small)  Right Coronary Artery     normal  *  Denotes significant lesion   Infarct Vessel:  LCX  MI Location:  inferior, lateral  Initial Infarct Territory Collateral Perfusion: grade 0 - no collateral    filling  INTERVENTIONAL CATHETERIZATION ----------------------------------------------------------------------------  Lesion #1:  LPAV 100%  (pre)  -> 50%  (interval)  -> Normal (post)  (IRA) ----------------------------------------------------------------------------  Catheters:    J&J/Cordis 6Fr XB 3.5  Guide wires:    Freight forwarder .014  x 180cm Wire  Devices:    Paediatric nurse 2 Monorail 71mm X 2.92mm    J&J/Cordis CYPHER drug eluting Stent RX 3.50 X 88mm    [employer]/SciMed Quantum Maverick 15 - 3.33mm Mon  Details:    Maximum atmosphere:  10    TIMI:  0(pre) -> 3(interval)  -> 3(post)    Initial RX:  PTCA    Additional RX:  CYPHER drug eluting Stent, PTCA  CATHETERIZATION INSTRUMENTS  Catheters:    6Fr ST PLUS JL4    6Fr ST PLUS Pigtail    6Fr JR 4 Vista Brite    6Fr XB 3.5  Devices:    Maverick 2 Monorail 38mm X 2.56mm    Quantum Maverick 15 - 3.66mm Mon    Starclose closure system    CYPHER drug eluting Stent RX 3.50 X 4mm  Guide wires:    Ecologist .014  x 180cm Wire  OUTCOME SUMMARY  Successful IRA revascularization with intervention  SPECIMENS REMOVED  None  REFERRING Nettleton Becker, MD  [place]  [place], [place]  [id number]

## 2016-02-11 NOTE — Patient Instructions (Addendum)
Go to the orthopedist office and deliver your cath report to [facility] and tell them we helped you change your insurance provider information.    Call [facility] and find out when you next appointment is.  You will be running out of Klonopin at the end of this month so maybe they would be willing to give you enough medication to make it to your next appointment.   Come back and see me in one to two months so we can see how you are doing.      IF you received an x-ray today, you will receive an invoice from [facility]. Please contact [facility] at [phone] with questions or concerns regarding your invoice.   IF you received labwork today, you will receive an invoice from [employer]. Please contact [facility] at [phone] with questions or concerns regarding your invoice.   Our billing staff will not be able to assist you with questions regarding bills from these companies.  You will be contacted with the lab results as soon as they are available. The fastest way to get your results is to activate your My Chart account. Instructions are located on the last page of this paperwork. If you have not heard from [place] regarding the results in 2 weeks, please contact this office.
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
## 2016-02-16 DIAGNOSIS — M545 Low back pain: Secondary | ICD-10-CM | POA: Diagnosis not present

## 2016-02-25 ENCOUNTER — Encounter: Payer: Self-pay | Admitting: Gastroenterology

## 2016-02-29 DIAGNOSIS — M545 Low back pain: Secondary | ICD-10-CM | POA: Diagnosis not present

## 2016-02-29 DIAGNOSIS — M4696 Unspecified inflammatory spondylopathy, lumbar region: Secondary | ICD-10-CM | POA: Diagnosis not present

## 2016-03-03 ENCOUNTER — Emergency Department (HOSPITAL_COMMUNITY)
Admission: EM | Admit: 2016-03-03 | Discharge: 2016-03-03 | Disposition: A | Discharge status: Home / Self Care | Payer: Medicare Other | Attending: Emergency Medicine | Admitting: Emergency Medicine

## 2016-03-03 ENCOUNTER — Emergency Department (HOSPITAL_COMMUNITY): Payer: Medicare Other

## 2016-03-03 ENCOUNTER — Encounter (HOSPITAL_COMMUNITY): Payer: Self-pay

## 2016-03-03 DIAGNOSIS — I252 Old myocardial infarction: Secondary | ICD-10-CM | POA: Diagnosis not present

## 2016-03-03 DIAGNOSIS — R079 Chest pain, unspecified: Secondary | ICD-10-CM | POA: Diagnosis not present

## 2016-03-03 DIAGNOSIS — E860 Dehydration: Secondary | ICD-10-CM | POA: Diagnosis not present

## 2016-03-03 DIAGNOSIS — Z7982 Long term (current) use of aspirin: Secondary | ICD-10-CM | POA: Diagnosis not present

## 2016-03-03 DIAGNOSIS — R109 Unspecified abdominal pain: Secondary | ICD-10-CM | POA: Diagnosis not present

## 2016-03-03 DIAGNOSIS — I110 Hypertensive heart disease with heart failure: Secondary | ICD-10-CM | POA: Diagnosis not present

## 2016-03-03 DIAGNOSIS — I951 Orthostatic hypotension: Secondary | ICD-10-CM | POA: Insufficient documentation

## 2016-03-03 DIAGNOSIS — I509 Heart failure, unspecified: Secondary | ICD-10-CM | POA: Insufficient documentation

## 2016-03-03 DIAGNOSIS — I2510 Atherosclerotic heart disease of native coronary artery without angina pectoris: Secondary | ICD-10-CM | POA: Insufficient documentation

## 2016-03-03 DIAGNOSIS — I959 Hypotension, unspecified: Secondary | ICD-10-CM | POA: Diagnosis not present

## 2016-03-03 DIAGNOSIS — F17210 Nicotine dependence, cigarettes, uncomplicated: Secondary | ICD-10-CM | POA: Diagnosis not present

## 2016-03-03 LAB — CBC WITH DIFFERENTIAL/PLATELET
Basophils Absolute: 0 10*3/uL (ref 0.0–0.1)
Basophils Relative: 0 %
Eosinophils Absolute: 0.1 10*3/uL (ref 0.0–0.7)
Eosinophils Relative: 3 %
HCT: 39.6 % (ref 39.0–52.0)
Hemoglobin: 13.8 g/dL (ref 13.0–17.0)
Lymphocytes Relative: 29 %
Lymphs Abs: 1.4 10*3/uL (ref 0.7–4.0)
MCH: 31.7 pg (ref 26.0–34.0)
MCHC: 34.8 g/dL (ref 30.0–36.0)
MCV: 91 fL (ref 78.0–100.0)
Monocytes Absolute: 0.7 10*3/uL (ref 0.1–1.0)
Monocytes Relative: 15 %
Neutro Abs: 2.5 10*3/uL (ref 1.7–7.7)
Neutrophils Relative %: 53 %
Platelets: 50 10*3/uL — ABNORMAL LOW (ref 150–400)
RBC: 4.35 MIL/uL (ref 4.22–5.81)
RDW: 13.7 % (ref 11.5–15.5)
WBC: 4.8 10*3/uL (ref 4.0–10.5)

## 2016-03-03 LAB — BASIC METABOLIC PANEL
Anion gap: 6 (ref 5–15)
BUN: 14 mg/dL (ref 6–20)
CO2: 25 mmol/L (ref 22–32)
Calcium: 8.3 mg/dL — ABNORMAL LOW (ref 8.9–10.3)
Chloride: 106 mmol/L (ref 101–111)
Creatinine, Ser: 1.23 mg/dL (ref 0.61–1.24)
GFR calc Af Amer: >60 mL/min (ref >60)
GFR calc non Af Amer: >60 mL/min (ref >60)
Glucose, Bld: 87 mg/dL (ref 65–99)
Potassium: 3.4 mmol/L — ABNORMAL LOW (ref 3.5–5.1)
Sodium: 137 mmol/L (ref 135–145)

## 2016-03-03 LAB — I-STAT TROPONIN, ED: Troponin i, poc: 0 ng/mL (ref 0.00–0.08)

## 2016-03-03 MED ORDER — IOPAMIDOL (ISOVUE-370) INJECTION 76%
INTRAVENOUS | Status: AC
  Administered 2016-03-03: 100 mL
  Filled 2016-03-03: qty 100

## 2016-03-03 NOTE — ED Notes (Signed)
Dr. Laneta Simmers at bedside and 2L bolus started, crash cart brought to bedside

## 2016-03-03 NOTE — ED Notes (Signed)
Pt ambulated in the hall without difficulty

## 2016-03-03 NOTE — ED Triage Notes (Signed)
Pt. Went to lunch and started complaining of feeling dizzy and weak. Went to his orthopedic appointment and upon their assessment, they called EMS. Pressure was 70's over 40's and pt. CO of leg weakness. hy of MI, stent placement, and CHF.

## 2016-03-03 NOTE — Discharge Instructions (Signed)
Please take a half a tablet of carvedilol in the morning and evening instead of a full tablet.  Please follow-up with your primary care doctor and your cardiologist this week.  Return to the ER for worsening symptoms.

## 2016-03-03 NOTE — ED Provider Notes (Signed)
Medical screening examination/treatment/procedure(s) were conducted as a shared visit with non-physician practitioner(s) and myself.  I personally evaluated the patient during the encounter.   EKG Interpretation None      60 y.o. male presents with hypotensive episode and presyncope. Beta blocked, fluid responsive on initial assessment. Suspect he is symptomatic from beta blockade and mild dehydration. Ambulated without difficulty after BP corrected. Close OP follow up recommended. Plan to follow up with PCP as needed and return precautions discussed for worsening or new concerning symptoms.   See related encounter note  Emergency Focused Ultrasound Exam Limited Ultrasound Assessment for the evaluation of Hypotension (RUSH PROTOCOL)  Performed and interpreted by Dr. Laneta Simmers Indication: Hypotension Multiple images of the bilateral lungs, heart, inferior vena cava, abdomen, and abdominal aorta are obtained for the purposes of estimating presence/absence of pneumothorax, cardiac contractility, volume status, abdominal free fluid and aortic aneurysm with a multifrequency probe. Findings: + B lines and sliding lung, no anechoic fluid in abdomen, nml cardiac contractility, no anechoic fluid surrounding heart, variable IVC collapse, no aortic dilation Interpretation: no pneumothorax, no hemoperitoneum, no pericardial effusion, depressed CVP, no abdominal aortic aneurysm Images archived electronically.  CPT Codes: thorax [id number],  cardiac [id number], abdomen [id number], limited retroperitoneal [phone] (study includes all codes)    Leo Grosser, MD March 06, 2016 [phone]
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
## 2016-03-10 ENCOUNTER — Other Ambulatory Visit: Payer: Self-pay | Admitting: Physician Assistant

## 2016-03-13 ENCOUNTER — Telehealth: Payer: Self-pay

## 2016-03-13 NOTE — Telephone Encounter (Signed)
Electronic request for Trintellix 10mg  tabs. 8/26 note states needs OV Pt seen 8/28, no mention of refill on this med Please advise.

## 2016-03-13 NOTE — Telephone Encounter (Signed)
We can refill for one month. Please advise that he will need to get this from his psychiatrist in the future and please advise that his sister call the psychiatrist and make sure he has an appointment.

## 2016-03-14 NOTE — Telephone Encounter (Signed)
Called pt and advised of RF. Pt was not sure if he had an appt scheduled w/psychiatrist. Called and spoke to sister Claiborne Billings and explained [name]'s message and that one RF was sent in for 1 mos. She agreed to call [facility] and make sure pt has appt sch within the month so that they can Rx this medication in future.

## 2016-03-19 ENCOUNTER — Emergency Department (HOSPITAL_COMMUNITY)
Admission: EM | Admit: 2016-03-19 | Discharge: 2016-03-19 | Disposition: A | Discharge status: Home / Self Care | Payer: Medicare Other | Attending: Emergency Medicine | Admitting: Emergency Medicine

## 2016-03-19 ENCOUNTER — Emergency Department (HOSPITAL_COMMUNITY): Payer: Medicare Other

## 2016-03-19 ENCOUNTER — Other Ambulatory Visit: Payer: Self-pay

## 2016-03-19 ENCOUNTER — Encounter (HOSPITAL_COMMUNITY): Payer: Self-pay

## 2016-03-19 DIAGNOSIS — R55 Syncope and collapse: Secondary | ICD-10-CM

## 2016-03-19 DIAGNOSIS — Z79899 Other long term (current) drug therapy: Secondary | ICD-10-CM | POA: Insufficient documentation

## 2016-03-19 DIAGNOSIS — E860 Dehydration: Secondary | ICD-10-CM | POA: Diagnosis not present

## 2016-03-19 DIAGNOSIS — Z7982 Long term (current) use of aspirin: Secondary | ICD-10-CM | POA: Diagnosis not present

## 2016-03-19 DIAGNOSIS — I252 Old myocardial infarction: Secondary | ICD-10-CM | POA: Insufficient documentation

## 2016-03-19 DIAGNOSIS — S299XXA Unspecified injury of thorax, initial encounter: Secondary | ICD-10-CM | POA: Diagnosis not present

## 2016-03-19 DIAGNOSIS — I2510 Atherosclerotic heart disease of native coronary artery without angina pectoris: Secondary | ICD-10-CM | POA: Diagnosis not present

## 2016-03-19 DIAGNOSIS — I110 Hypertensive heart disease with heart failure: Secondary | ICD-10-CM | POA: Diagnosis not present

## 2016-03-19 DIAGNOSIS — I509 Heart failure, unspecified: Secondary | ICD-10-CM | POA: Diagnosis not present

## 2016-03-19 DIAGNOSIS — I951 Orthostatic hypotension: Secondary | ICD-10-CM | POA: Diagnosis not present

## 2016-03-19 DIAGNOSIS — R531 Weakness: Secondary | ICD-10-CM

## 2016-03-19 DIAGNOSIS — R031 Nonspecific low blood-pressure reading: Secondary | ICD-10-CM | POA: Diagnosis not present

## 2016-03-19 DIAGNOSIS — F17210 Nicotine dependence, cigarettes, uncomplicated: Secondary | ICD-10-CM | POA: Diagnosis not present

## 2016-03-19 DIAGNOSIS — R51 Headache: Secondary | ICD-10-CM | POA: Diagnosis not present

## 2016-03-19 LAB — COMPREHENSIVE METABOLIC PANEL
ALT: 20 U/L (ref 17–63)
AST: 25 U/L (ref 15–41)
Albumin: 3.7 g/dL (ref 3.5–5.0)
Alkaline Phosphatase: 40 U/L (ref 38–126)
Anion gap: 7 (ref 5–15)
BUN: 11 mg/dL (ref 6–20)
CO2: 23 mmol/L (ref 22–32)
Calcium: 9 mg/dL (ref 8.9–10.3)
Chloride: 109 mmol/L (ref 101–111)
Creatinine, Ser: 0.91 mg/dL (ref 0.61–1.24)
GFR calc Af Amer: >60 mL/min (ref >60)
GFR calc non Af Amer: >60 mL/min (ref >60)
Glucose, Bld: 97 mg/dL (ref 65–99)
Potassium: 3.9 mmol/L (ref 3.5–5.1)
Sodium: 139 mmol/L (ref 135–145)
Total Bilirubin: 1.1 mg/dL (ref 0.3–1.2)
Total Protein: 5.6 g/dL — ABNORMAL LOW (ref 6.5–8.1)

## 2016-03-19 LAB — CBC WITH DIFFERENTIAL/PLATELET
Basophils Absolute: 0 10*3/uL (ref 0.0–0.1)
Basophils Relative: 1 %
Eosinophils Absolute: 0.2 10*3/uL (ref 0.0–0.7)
Eosinophils Relative: 3 %
HCT: 39.5 % (ref 39.0–52.0)
Hemoglobin: 14.1 g/dL (ref 13.0–17.0)
Lymphocytes Relative: 36 %
Lymphs Abs: 1.6 10*3/uL (ref 0.7–4.0)
MCH: 32 pg (ref 26.0–34.0)
MCHC: 35.7 g/dL (ref 30.0–36.0)
MCV: 89.6 fL (ref 78.0–100.0)
Monocytes Absolute: 0.6 10*3/uL (ref 0.1–1.0)
Monocytes Relative: 12 %
Neutro Abs: 2.2 10*3/uL (ref 1.7–7.7)
Neutrophils Relative %: 48 %
Platelets: 61 10*3/uL — ABNORMAL LOW (ref 150–400)
RBC: 4.41 MIL/uL (ref 4.22–5.81)
RDW: 13.8 % (ref 11.5–15.5)
WBC: 4.5 10*3/uL (ref 4.0–10.5)

## 2016-03-19 LAB — URINALYSIS, ROUTINE W REFLEX MICROSCOPIC
Glucose, UA: NEGATIVE mg/dL
Hgb urine dipstick: NEGATIVE
Ketones, ur: NEGATIVE mg/dL
Leukocytes, UA: NEGATIVE
Nitrite: NEGATIVE
Protein, ur: NEGATIVE mg/dL
Specific Gravity, Urine: 1.027 (ref 1.005–1.030)
pH: 6.5 (ref 5.0–8.0)

## 2016-03-19 LAB — TSH: TSH: 0.652 u[IU]/mL (ref 0.350–4.500)

## 2016-03-19 LAB — I-STAT TROPONIN, ED
Troponin i, poc: 0 ng/mL (ref 0.00–0.08)
Troponin i, poc: 0 ng/mL (ref 0.00–0.08)

## 2016-03-19 LAB — T4, FREE: Free T4: 0.98 ng/dL (ref 0.61–1.12)

## 2016-03-19 MED ORDER — SODIUM CHLORIDE 0.9 % IV BOLUS (SEPSIS)
1000.0000 mL | Freq: Once | INTRAVENOUS | Status: AC
  Administered 2016-03-19: 1000 mL via INTRAVENOUS

## 2016-03-19 NOTE — Telephone Encounter (Signed)
01/2016 last ov 02/2016 last labs

## 2016-03-19 NOTE — Telephone Encounter (Signed)
PATIENT WOULD LIKE [name] TO REFILL HIS DIVALPROEX SOD ER 500. HE THINKS IT IS FOR DEPRESSION. HE SAID HE DOES NOT HAVE TIME TO COME INTO THE OFFICE THIS MONTH. BEST PHONE [phone] (CELL)  PHARMACY CHOICE IS [facility]  ON [name].  [name]

## 2016-03-19 NOTE — Telephone Encounter (Signed)
1 months supply only.  He needs to get this from [facility].

## 2016-03-19 NOTE — ED Notes (Signed)
Pt transported to CT ?
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
## 2016-03-19 NOTE — ED Notes (Signed)
Dr. Billy Fischer states patient may drink/eat.

## 2016-03-19 NOTE — ED Notes (Signed)
Dr. Schlossman at bedside at this time.  

## 2016-03-19 NOTE — ED Triage Notes (Signed)
Per EMS - pt seen Friday for hotn and bradycardia. Pt d/c home and told to decrease carvedilol dosage. Pt still not feeling well today, family called EMS. Initial BP 100/64 and hr 40bpm; last BP 116/66 and hr 50bpm. Negative for orthostatic changes. Denies CP/shortness of breath.

## 2016-03-20 ENCOUNTER — Other Ambulatory Visit: Payer: Self-pay | Admitting: Physician Assistant

## 2016-03-24 ENCOUNTER — Encounter (HOSPITAL_COMMUNITY): Payer: Self-pay | Admitting: Emergency Medicine

## 2016-03-24 ENCOUNTER — Emergency Department (HOSPITAL_COMMUNITY)
Admission: EM | Admit: 2016-03-24 | Discharge: 2016-03-24 | Disposition: A | Discharge status: Home / Self Care | Payer: Medicare Other | Attending: Emergency Medicine | Admitting: Emergency Medicine

## 2016-03-24 DIAGNOSIS — Z7982 Long term (current) use of aspirin: Secondary | ICD-10-CM | POA: Diagnosis not present

## 2016-03-24 DIAGNOSIS — I2510 Atherosclerotic heart disease of native coronary artery without angina pectoris: Secondary | ICD-10-CM | POA: Diagnosis not present

## 2016-03-24 DIAGNOSIS — I509 Heart failure, unspecified: Secondary | ICD-10-CM | POA: Insufficient documentation

## 2016-03-24 DIAGNOSIS — R569 Unspecified convulsions: Secondary | ICD-10-CM

## 2016-03-24 DIAGNOSIS — I110 Hypertensive heart disease with heart failure: Secondary | ICD-10-CM | POA: Insufficient documentation

## 2016-03-24 DIAGNOSIS — I959 Hypotension, unspecified: Secondary | ICD-10-CM | POA: Diagnosis not present

## 2016-03-24 DIAGNOSIS — F17290 Nicotine dependence, other tobacco product, uncomplicated: Secondary | ICD-10-CM | POA: Insufficient documentation

## 2016-03-24 DIAGNOSIS — Z79899 Other long term (current) drug therapy: Secondary | ICD-10-CM | POA: Diagnosis not present

## 2016-03-24 DIAGNOSIS — G40909 Epilepsy, unspecified, not intractable, without status epilepticus: Secondary | ICD-10-CM | POA: Diagnosis present

## 2016-03-24 LAB — COMPREHENSIVE METABOLIC PANEL
ALT: 48 U/L (ref 17–63)
AST: 54 U/L — ABNORMAL HIGH (ref 15–41)
Albumin: 3.9 g/dL (ref 3.5–5.0)
Alkaline Phosphatase: 63 U/L (ref 38–126)
Anion gap: 13 (ref 5–15)
BUN: 10 mg/dL (ref 6–20)
CO2: 15 mmol/L — ABNORMAL LOW (ref 22–32)
Calcium: 8.5 mg/dL — ABNORMAL LOW (ref 8.9–10.3)
Chloride: 112 mmol/L — ABNORMAL HIGH (ref 101–111)
Creatinine, Ser: 0.94 mg/dL (ref 0.61–1.24)
GFR calc Af Amer: >60 mL/min (ref >60)
GFR calc non Af Amer: >60 mL/min (ref >60)
Glucose, Bld: 109 mg/dL — ABNORMAL HIGH (ref 65–99)
Potassium: 3.6 mmol/L (ref 3.5–5.1)
Sodium: 140 mmol/L (ref 135–145)
Total Bilirubin: 1.2 mg/dL (ref 0.3–1.2)
Total Protein: 6 g/dL — ABNORMAL LOW (ref 6.5–8.1)

## 2016-03-24 LAB — CBC WITH DIFFERENTIAL/PLATELET
Basophils Absolute: 0 10*3/uL (ref 0.0–0.1)
Basophils Relative: 0 %
Eosinophils Absolute: 0.1 10*3/uL (ref 0.0–0.7)
Eosinophils Relative: 1 %
HCT: 40.2 % (ref 39.0–52.0)
Hemoglobin: 13.7 g/dL (ref 13.0–17.0)
Lymphocytes Relative: 26 %
Lymphs Abs: 2.8 10*3/uL (ref 0.7–4.0)
MCH: 31.4 pg (ref 26.0–34.0)
MCHC: 34.1 g/dL (ref 30.0–36.0)
MCV: 92 fL (ref 78.0–100.0)
Monocytes Absolute: 1.5 10*3/uL — ABNORMAL HIGH (ref 0.1–1.0)
Monocytes Relative: 14 %
Neutro Abs: 6.4 10*3/uL (ref 1.7–7.7)
Neutrophils Relative %: 59 %
Platelets: 91 10*3/uL — ABNORMAL LOW (ref 150–400)
RBC: 4.37 MIL/uL (ref 4.22–5.81)
RDW: 14.4 % (ref 11.5–15.5)
WBC: 10.8 10*3/uL — ABNORMAL HIGH (ref 4.0–10.5)

## 2016-03-24 LAB — TROPONIN I: Troponin I: <0.03 ng/mL (ref <0.03)

## 2016-03-24 LAB — VALPROIC ACID LEVEL: Valproic Acid Lvl: <10 ug/mL — ABNORMAL LOW (ref 50.0–100.0)

## 2016-03-24 LAB — CBG MONITORING, ED: Glucose-Capillary: 107 mg/dL — ABNORMAL HIGH (ref 65–99)

## 2016-03-24 LAB — ETHANOL: Alcohol, Ethyl (B): <5 mg/dL (ref <5)

## 2016-03-24 LAB — BRAIN NATRIURETIC PEPTIDE: B Natriuretic Peptide: 270.7 pg/mL — ABNORMAL HIGH (ref 0.0–100.0)

## 2016-03-24 MED ORDER — LORAZEPAM 2 MG/ML IJ SOLN
INTRAMUSCULAR | Status: AC
  Administered 2016-03-24: 2 mg
  Filled 2016-03-24: qty 1

## 2016-03-24 MED ORDER — SODIUM CHLORIDE 0.9 % IV BOLUS (SEPSIS)
1000.0000 mL | Freq: Once | INTRAVENOUS | Status: AC
  Administered 2016-03-24: 1000 mL via INTRAVENOUS

## 2016-03-24 MED ORDER — DIVALPROEX SODIUM 500 MG PO DR TAB
500.0000 mg | DELAYED_RELEASE_TABLET | Freq: Once | ORAL | Status: AC
  Administered 2016-03-24: 500 mg via ORAL
  Filled 2016-03-24: qty 1

## 2016-03-24 MED ORDER — DIVALPROEX SODIUM 500 MG PO DR TAB: 500.0000 mg | DELAYED_RELEASE_TABLET | Freq: Three times a day (TID) | ORAL | 0 refills | Status: DC
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
## 2016-03-24 NOTE — ED Notes (Signed)
Bed: WA17 Expected date:  Expected time:  Means of arrival:  Comments: 60 yo M, seizure

## 2016-03-25 ENCOUNTER — Ambulatory Visit: Payer: Self-pay | Admitting: Gastroenterology

## 2016-03-25 MED ORDER — DIVALPROEX SODIUM 500 MG PO DR TAB: 500.0000 mg | DELAYED_RELEASE_TABLET | Freq: Every day | ORAL | 0 refills | Status: DC

## 2016-03-25 NOTE — Telephone Encounter (Signed)
Left message about one months and needed to get future refills from [facility]

## 2016-04-15 ENCOUNTER — Ambulatory Visit (INDEPENDENT_AMBULATORY_CARE_PROVIDER_SITE_OTHER): Payer: Medicare Other | Admitting: Physician Assistant

## 2016-04-15 ENCOUNTER — Ambulatory Visit (INDEPENDENT_AMBULATORY_CARE_PROVIDER_SITE_OTHER): Payer: Medicare Other

## 2016-04-15 VITALS — BP 110/64 | HR 56 | Temp 97.6°F | Resp 16 | Ht 68.0 in | Wt 185.4 lb

## 2016-04-15 DIAGNOSIS — R569 Unspecified convulsions: Secondary | ICD-10-CM

## 2016-04-15 DIAGNOSIS — Z0000 Encounter for general adult medical examination without abnormal findings: Secondary | ICD-10-CM

## 2016-04-15 DIAGNOSIS — R634 Abnormal weight loss: Secondary | ICD-10-CM

## 2016-04-15 LAB — COMPREHENSIVE METABOLIC PANEL
ALT: 16 U/L (ref 9–46)
AST: 24 U/L (ref 10–35)
Albumin: 4.5 g/dL (ref 3.6–5.1)
Alkaline Phosphatase: 53 U/L (ref 40–115)
BUN: 18 mg/dL (ref 7–25)
CO2: 23 mmol/L (ref 20–31)
Calcium: 9.3 mg/dL (ref 8.6–10.3)
Chloride: 106 mmol/L (ref 98–110)
Creat: 0.91 mg/dL (ref 0.70–1.25)
Glucose, Bld: 79 mg/dL (ref 65–99)
Potassium: 4.2 mmol/L (ref 3.5–5.3)
Sodium: 140 mmol/L (ref 135–146)
Total Bilirubin: 1.1 mg/dL (ref 0.2–1.2)
Total Protein: 6.7 g/dL (ref 6.1–8.1)

## 2016-04-15 LAB — HEPATIC FUNCTION PANEL
ALT: 16 U/L (ref 9–46)
AST: 24 U/L (ref 10–35)
Albumin: 4.5 g/dL (ref 3.6–5.1)
Alkaline Phosphatase: 53 U/L (ref 40–115)
Bilirubin, Direct: 0.3 mg/dL — ABNORMAL HIGH (ref <=0.2)
Indirect Bilirubin: 0.8 mg/dL (ref 0.2–1.2)
Total Bilirubin: 1.1 mg/dL (ref 0.2–1.2)
Total Protein: 6.7 g/dL (ref 6.1–8.1)

## 2016-04-15 LAB — CBC
HCT: 44.4 % (ref 38.5–50.0)
Hemoglobin: 15.6 g/dL (ref 13.2–17.1)
MCH: 31.4 pg (ref 27.0–33.0)
MCHC: 35.1 g/dL (ref 32.0–36.0)
MCV: 89.3 fL (ref 80.0–100.0)
MPV: 10.4 fL (ref 7.5–12.5)
Platelets: 81 10*3/uL — ABNORMAL LOW (ref 140–400)
RBC: 4.97 MIL/uL (ref 4.20–5.80)
RDW: 14.9 % (ref 11.0–15.0)
WBC: 7 10*3/uL (ref 3.8–10.8)

## 2016-04-15 LAB — POCT SEDIMENTATION RATE: POCT SED RATE: 3 mm/hr (ref 0–22)

## 2016-04-15 LAB — TSH: TSH: 3.05 mIU/L (ref 0.40–4.50)

## 2016-04-15 MED ORDER — ALBUTEROL SULFATE HFA 108 (90 BASE) MCG/ACT IN AERS: 2.0000 | INHALATION_SPRAY | Freq: Four times a day (QID) | RESPIRATORY_TRACT | 6 refills | Status: DC | PRN
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
## 2016-04-15 NOTE — Patient Instructions (Signed)
     IF you received an x-ray today, you will receive an invoice from [facility]. Please contact [facility] at [phone] with questions or concerns regarding your invoice.   IF you received labwork today, you will receive an invoice from [facility]. Please contact [facility] at [phone] with questions or concerns regarding your invoice.   Our billing staff will not be able to assist you with questions regarding bills from these companies.  You will be contacted with the lab results as soon as they are available. The fastest way to get your results is to activate your My Chart account. Instructions are located on the last page of this paperwork. If you have not heard from us regarding the results in 2 weeks, please contact this office.      

## 2016-04-19 ENCOUNTER — Other Ambulatory Visit: Payer: Self-pay | Admitting: Physician Assistant

## 2016-04-22 ENCOUNTER — Other Ambulatory Visit: Payer: Self-pay | Admitting: Physician Assistant

## 2016-04-22 NOTE — Progress Notes (Signed)
Patient advised.

## 2016-04-22 NOTE — Progress Notes (Signed)
Please let him know that he is only taking 4 psych meds at this time.  Sertraline/Zoloft, Seroquel XR, Buspar, and Trazodone.  Advise that he take the trintellex and put it in the bag of medications that he is not longer taking. I will ammend his med list here. I want to see him back in abut 20 days. [name], MS, PA-C 2:51 PM, 04/22/2016

## 2016-04-26 ENCOUNTER — Encounter (HOSPITAL_COMMUNITY): Payer: Self-pay

## 2016-04-26 ENCOUNTER — Inpatient Hospital Stay (HOSPITAL_COMMUNITY)
Admission: EM | Admit: 2016-04-26 | Discharge: 2016-04-27 | DRG: 810 | Disposition: A | Discharge status: Home / Self Care | Payer: Medicare Other | Attending: Internal Medicine | Admitting: Internal Medicine

## 2016-04-26 DIAGNOSIS — F319 Bipolar disorder, unspecified: Secondary | ICD-10-CM | POA: Diagnosis present

## 2016-04-26 DIAGNOSIS — Z79899 Other long term (current) drug therapy: Secondary | ICD-10-CM

## 2016-04-26 DIAGNOSIS — I110 Hypertensive heart disease with heart failure: Secondary | ICD-10-CM | POA: Diagnosis not present

## 2016-04-26 DIAGNOSIS — Z6827 Body mass index (BMI) 27.0-27.9, adult: Secondary | ICD-10-CM

## 2016-04-26 DIAGNOSIS — J439 Emphysema, unspecified: Secondary | ICD-10-CM | POA: Diagnosis not present

## 2016-04-26 DIAGNOSIS — D696 Thrombocytopenia, unspecified: Secondary | ICD-10-CM

## 2016-04-26 DIAGNOSIS — F419 Anxiety disorder, unspecified: Secondary | ICD-10-CM | POA: Diagnosis present

## 2016-04-26 DIAGNOSIS — I509 Heart failure, unspecified: Secondary | ICD-10-CM | POA: Diagnosis present

## 2016-04-26 DIAGNOSIS — G40909 Epilepsy, unspecified, not intractable, without status epilepticus: Secondary | ICD-10-CM | POA: Diagnosis present

## 2016-04-26 DIAGNOSIS — D61818 Other pancytopenia: Secondary | ICD-10-CM | POA: Diagnosis not present

## 2016-04-26 DIAGNOSIS — R634 Abnormal weight loss: Secondary | ICD-10-CM | POA: Diagnosis not present

## 2016-04-26 DIAGNOSIS — E785 Hyperlipidemia, unspecified: Secondary | ICD-10-CM | POA: Diagnosis present

## 2016-04-26 DIAGNOSIS — K7460 Unspecified cirrhosis of liver: Secondary | ICD-10-CM | POA: Diagnosis not present

## 2016-04-26 DIAGNOSIS — D649 Anemia, unspecified: Secondary | ICD-10-CM | POA: Diagnosis present

## 2016-04-26 DIAGNOSIS — I2510 Atherosclerotic heart disease of native coronary artery without angina pectoris: Secondary | ICD-10-CM | POA: Diagnosis present

## 2016-04-26 DIAGNOSIS — R569 Unspecified convulsions: Secondary | ICD-10-CM | POA: Diagnosis not present

## 2016-04-26 DIAGNOSIS — I2583 Coronary atherosclerosis due to lipid rich plaque: Secondary | ICD-10-CM | POA: Diagnosis present

## 2016-04-26 DIAGNOSIS — B182 Chronic viral hepatitis C: Secondary | ICD-10-CM | POA: Diagnosis present

## 2016-04-26 DIAGNOSIS — I10 Essential (primary) hypertension: Secondary | ICD-10-CM | POA: Diagnosis not present

## 2016-04-26 DIAGNOSIS — Z823 Family history of stroke: Secondary | ICD-10-CM

## 2016-04-26 DIAGNOSIS — K7030 Alcoholic cirrhosis of liver without ascites: Secondary | ICD-10-CM | POA: Diagnosis not present

## 2016-04-26 DIAGNOSIS — F17290 Nicotine dependence, other tobacco product, uncomplicated: Secondary | ICD-10-CM | POA: Diagnosis not present

## 2016-04-26 DIAGNOSIS — Z791 Long term (current) use of non-steroidal anti-inflammatories (NSAID): Secondary | ICD-10-CM

## 2016-04-26 DIAGNOSIS — I252 Old myocardial infarction: Secondary | ICD-10-CM

## 2016-04-26 DIAGNOSIS — F3170 Bipolar disorder, currently in remission, most recent episode unspecified: Secondary | ICD-10-CM | POA: Diagnosis present

## 2016-04-26 DIAGNOSIS — Z7982 Long term (current) use of aspirin: Secondary | ICD-10-CM

## 2016-04-26 LAB — CBC WITH DIFFERENTIAL/PLATELET
Basophils Absolute: 0 10*3/uL (ref 0.0–0.1)
Basophils Absolute: 0 10*3/uL (ref 0.0–0.1)
Basophils Relative: 1 %
Basophils Relative: 0 %
Eosinophils Absolute: 0 10*3/uL (ref 0.0–0.7)
Eosinophils Absolute: 0.1 10*3/uL (ref 0.0–0.7)
Eosinophils Relative: 2 %
Eosinophils Relative: 2 %
HCT: 19.6 % — ABNORMAL LOW (ref 39.0–52.0)
HCT: 34.2 % — ABNORMAL LOW (ref 39.0–52.0)
Hemoglobin: 6.9 g/dL — CL (ref 13.0–17.0)
Hemoglobin: 12.1 g/dL — ABNORMAL LOW (ref 13.0–17.0)
Lymphocytes Relative: 22 %
Lymphocytes Relative: 26 %
Lymphs Abs: 0.5 10*3/uL — ABNORMAL LOW (ref 0.7–4.0)
Lymphs Abs: 1.2 10*3/uL (ref 0.7–4.0)
MCH: 31.9 pg (ref 26.0–34.0)
MCH: 31.2 pg (ref 26.0–34.0)
MCHC: 35.2 g/dL (ref 30.0–36.0)
MCHC: 35.4 g/dL (ref 30.0–36.0)
MCV: 90.7 fL (ref 78.0–100.0)
MCV: 88.1 fL (ref 78.0–100.0)
Monocytes Absolute: 0.3 10*3/uL (ref 0.1–1.0)
Monocytes Absolute: 0.7 10*3/uL (ref 0.1–1.0)
Monocytes Relative: 13 %
Monocytes Relative: 15 %
Neutro Abs: 1.4 10*3/uL — ABNORMAL LOW (ref 1.7–7.7)
Neutro Abs: 2.5 10*3/uL (ref 1.7–7.7)
Neutrophils Relative %: 62 %
Neutrophils Relative %: 56 %
Platelets: 23 10*3/uL — CL (ref 150–400)
Platelets: 44 10*3/uL — ABNORMAL LOW (ref 150–400)
RBC: 2.16 MIL/uL — ABNORMAL LOW (ref 4.22–5.81)
RBC: 3.88 MIL/uL — ABNORMAL LOW (ref 4.22–5.81)
RDW: 14.8 % (ref 11.5–15.5)
RDW: 14.7 % (ref 11.5–15.5)
WBC: 2.2 10*3/uL — ABNORMAL LOW (ref 4.0–10.5)
WBC: 4.4 10*3/uL (ref 4.0–10.5)

## 2016-04-26 LAB — COMPREHENSIVE METABOLIC PANEL
ALT: 25 U/L (ref 17–63)
AST: 53 U/L — ABNORMAL HIGH (ref 15–41)
Albumin: 4.5 g/dL (ref 3.5–5.0)
Alkaline Phosphatase: 51 U/L (ref 38–126)
Anion gap: 10 (ref 5–15)
BUN: 15 mg/dL (ref 6–20)
CO2: 23 mmol/L (ref 22–32)
Calcium: 9.1 mg/dL (ref 8.9–10.3)
Chloride: 106 mmol/L (ref 101–111)
Creatinine, Ser: 0.98 mg/dL (ref 0.61–1.24)
GFR calc Af Amer: >60 mL/min (ref >60)
GFR calc non Af Amer: >60 mL/min (ref >60)
Glucose, Bld: 93 mg/dL (ref 65–99)
Potassium: 5.1 mmol/L (ref 3.5–5.1)
Sodium: 139 mmol/L (ref 135–145)
Total Bilirubin: 1.7 mg/dL — ABNORMAL HIGH (ref 0.3–1.2)
Total Protein: 7 g/dL (ref 6.5–8.1)

## 2016-04-26 LAB — URINALYSIS, ROUTINE W REFLEX MICROSCOPIC
Glucose, UA: NEGATIVE mg/dL
Hgb urine dipstick: NEGATIVE
Ketones, ur: 15 mg/dL — AB
Leukocytes, UA: NEGATIVE
Nitrite: NEGATIVE
Protein, ur: NEGATIVE mg/dL
Specific Gravity, Urine: 1.022 (ref 1.005–1.030)
pH: 6.5 (ref 5.0–8.0)

## 2016-04-26 LAB — PROTIME-INR
INR: 1.28
Prothrombin Time: 16.1 seconds — ABNORMAL HIGH (ref 11.4–15.2)

## 2016-04-26 LAB — TSH: TSH: 2.154 u[IU]/mL (ref 0.350–4.500)

## 2016-04-26 LAB — RETICULOCYTES
RBC.: 3.89 MIL/uL — ABNORMAL LOW (ref 4.22–5.81)
Retic Count, Absolute: 81.7 10*3/uL (ref 19.0–186.0)
Retic Ct Pct: 2.1 % (ref 0.4–3.1)

## 2016-04-26 LAB — FIBRINOGEN: Fibrinogen: 206 mg/dL — ABNORMAL LOW (ref 210–475)

## 2016-04-26 LAB — SAVE SMEAR

## 2016-04-26 LAB — APTT: aPTT: 37 seconds — ABNORMAL HIGH (ref 24–36)

## 2016-04-26 LAB — ETHANOL: Alcohol, Ethyl (B): <5 mg/dL (ref <5)

## 2016-04-26 LAB — POC OCCULT BLOOD, ED: Fecal Occult Bld: NEGATIVE

## 2016-04-26 LAB — VALPROIC ACID LEVEL: Valproic Acid Lvl: 51 ug/mL (ref 50.0–100.0)

## 2016-04-26 MED ORDER — ACETAMINOPHEN 650 MG RE SUPP: 650.0000 mg | Freq: Four times a day (QID) | RECTAL | Status: DC | PRN

## 2016-04-26 MED ORDER — HYDROXYZINE HCL 10 MG PO TABS: 10.0000 mg | ORAL_TABLET | Freq: Three times a day (TID) | ORAL | Status: DC | PRN

## 2016-04-26 MED ORDER — LORAZEPAM 2 MG/ML IJ SOLN
1.0000 mg | Freq: Once | INTRAMUSCULAR | Status: AC
  Administered 2016-04-26: 1 mg via INTRAVENOUS
  Filled 2016-04-26: qty 1

## 2016-04-26 MED ORDER — HYDROCODONE-ACETAMINOPHEN 5-325 MG PO TABS: 1.0000 | ORAL_TABLET | ORAL | Status: DC | PRN

## 2016-04-26 MED ORDER — TRAZODONE HCL 100 MG PO TABS
100.0000 mg | ORAL_TABLET | Freq: Every day | ORAL | Status: DC
  Administered 2016-04-27: 100 mg via ORAL
  Filled 2016-04-26: qty 1

## 2016-04-26 MED ORDER — ENSURE ENLIVE PO LIQD
237.0000 mL | Freq: Two times a day (BID) | ORAL | Status: DC
  Administered 2016-04-27 (×2): 237 mL via ORAL

## 2016-04-26 MED ORDER — ONDANSETRON HCL 4 MG/2ML IJ SOLN
4.0000 mg | Freq: Four times a day (QID) | INTRAMUSCULAR | Status: DC | PRN
Start: 2016-04-26 — End: 2016-04-27

## 2016-04-26 MED ORDER — POLYETHYLENE GLYCOL 3350 17 G PO PACK: 17.0000 g | PACK | Freq: Every day | ORAL | Status: DC | PRN

## 2016-04-26 MED ORDER — SODIUM CHLORIDE 0.9% FLUSH: 3.0000 mL | INTRAVENOUS | Status: DC | PRN

## 2016-04-26 MED ORDER — TIZANIDINE HCL 4 MG PO TABS: 4.0000 mg | ORAL_TABLET | Freq: Three times a day (TID) | ORAL | Status: DC | PRN

## 2016-04-26 MED ORDER — ACETAMINOPHEN 325 MG PO TABS: 650.0000 mg | ORAL_TABLET | Freq: Four times a day (QID) | ORAL | Status: DC | PRN

## 2016-04-26 MED ORDER — ONDANSETRON HCL 4 MG PO TABS: 4.0000 mg | ORAL_TABLET | Freq: Four times a day (QID) | ORAL | Status: DC | PRN

## 2016-04-26 MED ORDER — QUETIAPINE FUMARATE ER 50 MG PO TB24
150.0000 mg | ORAL_TABLET | Freq: Every day | ORAL | Status: DC
  Administered 2016-04-27: 150 mg via ORAL
  Filled 2016-04-26: qty 3

## 2016-04-26 MED ORDER — SODIUM CHLORIDE 0.9 % IV SOLN: 250.0000 mL | INTRAVENOUS | Status: DC | PRN

## 2016-04-26 MED ORDER — LORAZEPAM 2 MG/ML IJ SOLN: 2.0000 mg | INTRAMUSCULAR | Status: DC | PRN

## 2016-04-26 MED ORDER — BUSPIRONE HCL 10 MG PO TABS
15.0000 mg | ORAL_TABLET | Freq: Three times a day (TID) | ORAL | Status: DC
  Administered 2016-04-27 (×3): 15 mg via ORAL
  Filled 2016-04-26 (×3): qty 2

## 2016-04-26 MED ORDER — ALBUTEROL SULFATE (2.5 MG/3ML) 0.083% IN NEBU: 2.5000 mg | INHALATION_SOLUTION | RESPIRATORY_TRACT | Status: DC | PRN

## 2016-04-26 MED ORDER — SODIUM CHLORIDE 0.9 % IV SOLN: 10.0000 mL/h | Freq: Once | INTRAVENOUS | Status: DC

## 2016-04-26 MED ORDER — SODIUM CHLORIDE 0.9% FLUSH
3.0000 mL | Freq: Two times a day (BID) | INTRAVENOUS | Status: DC
  Administered 2016-04-27: 3 mL via INTRAVENOUS

## 2016-04-26 MED ORDER — SODIUM CHLORIDE 0.9 % IV BOLUS (SEPSIS)
1000.0000 mL | Freq: Once | INTRAVENOUS | Status: AC
  Administered 2016-04-26: 1000 mL via INTRAVENOUS

## 2016-04-26 MED ORDER — DIVALPROEX SODIUM 250 MG PO DR TAB
500.0000 mg | DELAYED_RELEASE_TABLET | Freq: Three times a day (TID) | ORAL | Status: DC
  Administered 2016-04-27 (×3): 500 mg via ORAL
  Filled 2016-04-26 (×3): qty 2

## 2016-04-26 MED ORDER — ALBUTEROL SULFATE HFA 108 (90 BASE) MCG/ACT IN AERS: 2.0000 | INHALATION_SPRAY | RESPIRATORY_TRACT | Status: DC | PRN

## 2016-04-26 MED ORDER — SODIUM CHLORIDE 0.9 % IV SOLN: Freq: Once | INTRAVENOUS | Status: DC

## 2016-04-26 MED ORDER — SERTRALINE HCL 50 MG PO TABS
50.0000 mg | ORAL_TABLET | Freq: Every day | ORAL | Status: DC
  Administered 2016-04-27: 50 mg via ORAL
  Filled 2016-04-26: qty 1
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
## 2016-04-26 NOTE — ED Triage Notes (Addendum)
Patient bib [facility].  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  Patient was discovered by friend stating he had a siezure (unwitnessed)  Patient with Hx of siezures, per patient is compliant with medications.  Denies alcohol use or drug use today.  Per [facility] patient has small abrasion to the left elbow, blood on the nose but no oral trauma. Per [facility] patient A&Ox3.

## 2016-04-26 NOTE — ED Notes (Signed)
Bed: RESB Expected date:  Expected time:  Means of arrival:  Comments: EMS ? seizure 

## 2016-04-26 NOTE — Progress Notes (Signed)
Pt arrived to room 1514 and pt nurse is getting patient settled. Phone call taken from [place] with lab since pt nurse is with pt. States previous CBC from ED was contaminated and cancelled. A recheck on CBC was performed and pt Hgb is 12.1. This patient nurse will be notified. Will continue to monitor.
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
## 2016-04-26 NOTE — ED Notes (Signed)
Patient aware of the need for urine.   

## 2016-04-27 DIAGNOSIS — K7460 Unspecified cirrhosis of liver: Secondary | ICD-10-CM | POA: Diagnosis not present

## 2016-04-27 DIAGNOSIS — I252 Old myocardial infarction: Secondary | ICD-10-CM | POA: Diagnosis not present

## 2016-04-27 DIAGNOSIS — F319 Bipolar disorder, unspecified: Secondary | ICD-10-CM | POA: Diagnosis not present

## 2016-04-27 DIAGNOSIS — E785 Hyperlipidemia, unspecified: Secondary | ICD-10-CM | POA: Diagnosis not present

## 2016-04-27 DIAGNOSIS — D649 Anemia, unspecified: Secondary | ICD-10-CM | POA: Diagnosis not present

## 2016-04-27 DIAGNOSIS — D61818 Other pancytopenia: Secondary | ICD-10-CM | POA: Diagnosis not present

## 2016-04-27 DIAGNOSIS — G40909 Epilepsy, unspecified, not intractable, without status epilepticus: Secondary | ICD-10-CM

## 2016-04-27 DIAGNOSIS — I509 Heart failure, unspecified: Secondary | ICD-10-CM | POA: Diagnosis not present

## 2016-04-27 DIAGNOSIS — J439 Emphysema, unspecified: Secondary | ICD-10-CM | POA: Diagnosis not present

## 2016-04-27 DIAGNOSIS — I2510 Atherosclerotic heart disease of native coronary artery without angina pectoris: Secondary | ICD-10-CM | POA: Diagnosis not present

## 2016-04-27 DIAGNOSIS — I10 Essential (primary) hypertension: Secondary | ICD-10-CM | POA: Diagnosis not present

## 2016-04-27 DIAGNOSIS — I110 Hypertensive heart disease with heart failure: Secondary | ICD-10-CM | POA: Diagnosis not present

## 2016-04-27 DIAGNOSIS — R569 Unspecified convulsions: Secondary | ICD-10-CM

## 2016-04-27 LAB — CBC WITH DIFFERENTIAL/PLATELET
Basophils Absolute: 0 10*3/uL (ref 0.0–0.1)
Basophils Relative: 1 %
Eosinophils Absolute: 0.1 10*3/uL (ref 0.0–0.7)
Eosinophils Relative: 3 %
HCT: 36.2 % — ABNORMAL LOW (ref 39.0–52.0)
Hemoglobin: 12.4 g/dL — ABNORMAL LOW (ref 13.0–17.0)
Lymphocytes Relative: 32 %
Lymphs Abs: 1.1 10*3/uL (ref 0.7–4.0)
MCH: 30.3 pg (ref 26.0–34.0)
MCHC: 34.3 g/dL (ref 30.0–36.0)
MCV: 88.5 fL (ref 78.0–100.0)
Monocytes Absolute: 0.6 10*3/uL (ref 0.1–1.0)
Monocytes Relative: 17 %
Neutro Abs: 1.7 10*3/uL (ref 1.7–7.7)
Neutrophils Relative %: 47 %
Platelets: 52 10*3/uL — ABNORMAL LOW (ref 150–400)
RBC: 4.09 MIL/uL — ABNORMAL LOW (ref 4.22–5.81)
RDW: 14.7 % (ref 11.5–15.5)
WBC: 3.6 10*3/uL — ABNORMAL LOW (ref 4.0–10.5)

## 2016-04-27 LAB — TYPE AND SCREEN
ABO/RH(D): A POS
Antibody Screen: NEGATIVE

## 2016-04-27 LAB — FOLATE: Folate: 14.1 ng/mL (ref >5.9)

## 2016-04-27 LAB — GLUCOSE, CAPILLARY: Glucose-Capillary: 107 mg/dL — ABNORMAL HIGH (ref 65–99)

## 2016-04-27 LAB — IRON AND TIBC
Iron: 98 ug/dL (ref 45–182)
Saturation Ratios: 32 % (ref 17.9–39.5)
TIBC: 311 ug/dL (ref 250–450)
UIBC: 213 ug/dL

## 2016-04-27 LAB — FERRITIN: Ferritin: 55 ng/mL (ref 24–336)

## 2016-04-27 LAB — VITAMIN B12: Vitamin B-12: 641 pg/mL (ref 180–914)

## 2016-04-27 LAB — C-REACTIVE PROTEIN: CRP: <0.8 mg/dL (ref <1.0)

## 2016-04-27 LAB — SEDIMENTATION RATE: Sed Rate: 0 mm/hr (ref 0–16)

## 2016-04-27 LAB — ABO/RH: ABO/RH(D): A POS

## 2016-04-27 MED ORDER — DIVALPROEX SODIUM 500 MG PO DR TAB: 500.0000 mg | DELAYED_RELEASE_TABLET | Freq: Three times a day (TID) | ORAL | 0 refills | Status: DC

## 2016-04-27 NOTE — Progress Notes (Signed)
Patient discharged.  Patient leaving with his personal belongings.  However, patient reports 2 rings and his keys missing.  Safety Zone Portal put in for missing items.  Patient being picked up by his sister at entrance.  Room air, denies pain, no s/s of distress, reports understanding of discharge instructions.  No complaints.  Patient described rings as :  1 - black onyx sterling silver with wolf's head and 1 - sterling silver and gold with Darl Householder in center.  Key chain is silver with 3 master keys, a house key, and a lawn mower key.

## 2016-04-27 NOTE — Progress Notes (Signed)
PCT on call Dr. To inform him that the repeat HGB was 12.1. Per Md. The transfusion has been put on hold.

## 2016-04-27 NOTE — Plan of Care (Signed)
Called about patient's Hb concentration, initially noted to be 6.9 at 21.02 HR and blood transfusion ordered, at 22.55Hr, a repeat lab showed Hb of 12.1. Lab reported the latest result is the correct Hb concentration.   Plan: Hold Blood Transfusion Continue other management as ordered.  Ned Clines, MD

## 2016-04-27 NOTE — Care Management Note (Signed)
Case Management Note  Patient Details  Name: [name] MRN: [id number] Date of Birth: Aug 21, 1955  Subjective/Objective:   Pancytopenia, Seizure Disorder, Bipolar Disorder, COPD               Action/Plan: Discharge Planning: AVS reviewed NCM spoke to pt and offered choice for HH/list provided. Pt agreeable to [facility] for HH. Contacted [facility] Liaison with new referral. Pt states he has RW at home. Sister assist with his care as needed.   PCP Tereasa Coop MD  Expected Discharge Date:  04/27/2016              Expected Discharge Plan:  [place]  In-House Referral:  NA  Discharge planning Services  CM Consult  Post Acute Care Choice:  Home Health Choice offered to:  Patient  DME Arranged:  N/A DME Agency:  NA  HH Arranged:  PT, RN [place] Agency:  [facility]  Status of Service:  Completed, signed off  If discussed at [place] of Stay Meetings, dates discussed:    Additional Comments:  Erenest Rasher, RN 04/27/2016, 2:09 PM

## 2016-04-27 NOTE — Evaluation (Signed)
Physical Therapy Evaluation Patient Details Name: [name] MRN: [id number] DOB: 01/04/1956 Today's Date: 04/27/2016   History of Present Illness  60 yo male admitted with Sz, pancytopenia. Hx of Sz, bipolar d/o, substance abuse, CHF, alcoholism with cirrhosis.   Clinical Impression  On eval, pt required Min guard-Min assist for mobility. He walked ~400 feet with a straight cane and ascended/descended 5 steps with use of rail and cane. Unsteady at times. He tolerated activity well. He did c/o mild lightheadedness with activity. Discussed d/c plan-pt is agreeable to HHPT follow up for gait and balance training.     Follow Up Recommendations Home health PT;Supervision - Intermittent    Equipment Recommendations  None recommended by PT (pt reports he has a walker and cane at home)    Recommendations for Other Services       Precautions / Restrictions Precautions Precautions: Fall Restrictions Weight Bearing Restrictions: No      Mobility  Bed Mobility Overal bed mobility: Modified Independent                Transfers Overall transfer level: Modified independent                  Ambulation/Gait Ambulation/Gait assistance: Min guard Ambulation Distance (Feet): 400 Feet Assistive device: Straight cane Gait Pattern/deviations: Step-through pattern     General Gait Details: slow gait speed. LOB x1 during first ~25 feet of ambulation distance when pt turned head to speak to nurse. Mildly unsteady intermittently thereafter.   Stairs Stairs: Yes Stairs assistance: Min guard Stair Management: One rail Right;With cane;Forwards Number of Stairs: 5 General stair comments: close guard for safety.   Wheelchair Mobility    Modified Rankin (Stroke Patients Only)       Balance Overall balance assessment: Needs assistance         Standing balance support: Single extremity supported Standing balance-Leahy Scale: Fair Standing balance comment: Had pt  perform static standing with EO/EC, narrow BOS, withstanding of perturbations; 360 degree turn; pick up of object-all with Min guard assist. No overt LOB with these tasks             High level balance activites: Head turns;Direction changes;Turns High Level Balance Comments: Had pt walk while performing head turns (up, down, left, right)- some difficulty noted and small amount of assist to stabilize.              Pertinent Vitals/Pain Pain Assessment: No/denies pain    Home Living Family/patient expects to be discharged to:: Private residence Living Arrangements: Alone   Type of Home: Mobile home Home Access: Stairs to enter Entrance Stairs-Rails: Psychiatric nurse of Steps: 5 Home Layout: One level Home Equipment: Walker - 2 wheels;Cane - single point      Prior Function Level of Independence: Independent with assistive device(s);Needs assistance         Comments: uses cane for ambulation usually. sister helps with managing daily meds.      Hand Dominance        Extremity/Trunk Assessment   Upper Extremity Assessment: Overall WFL for tasks assessed           Lower Extremity Assessment: Generalized weakness      Cervical / Trunk Assessment: Normal  Communication   Communication: No difficulties  Cognition Arousal/Alertness: Awake/alert Behavior During Therapy: WFL for tasks assessed/performed Overall Cognitive Status: Within Functional Limits for tasks assessed  General Comments      Exercises     Assessment/Plan    PT Assessment Patient needs continued PT services  PT Problem List Decreased mobility;Decreased balance          PT Treatment Interventions Gait training;Therapeutic activities;Therapeutic exercise;Patient/family education;Balance training;Functional mobility training    PT Goals (Current goals can be found in the Care Plan section)  Acute Rehab PT Goals Patient Stated Goal: home  soon PT Goal Formulation: With patient Time For Goal Achievement: 05/11/16 Potential to Achieve Goals: Good    Frequency Min 3X/week   Barriers to discharge        Co-evaluation               End of Session Equipment Utilized During Treatment: Gait belt Activity Tolerance: Patient tolerated treatment well Patient left: in bed;with call bell/phone within reach           Time: 1250-1314 PT Time Calculation (min) (ACUTE ONLY): 24 min   Charges:   PT Evaluation $PT Eval Low Complexity: 1 Procedure     PT G Codes:        Weston Anna, MPT Pager: [phone]

## 2016-04-27 NOTE — Progress Notes (Signed)
Paged Physical Therapy [phone]) about when patient may be seen today due to physician order and imminent discharge.  No return call at this time.

## 2016-04-27 NOTE — Discharge Summary (Signed)
Physician Discharge Summary  [name] [id number] DOB: 10/12/1955 DOA: 04/26/2016  PCP: Kathlen Brunswick, PA-C  Admit date: 04/26/2016 Discharge date: 04/27/2016  Admitted From: Home.  Disposition:  Home.   Recommendations for Outpatient Follow-up:  1. Follow up with PCP in 1-2 weeks 2. Please obtain BMP/CBC in one week 3. Please follow up with neurology in one week for seizures.   Home Health:yes   Discharge Condition:stable.  CODE STATUS: full code.  Diet recommendation: Heart Healthy   Brief/Interim Summary: [name] is a 60 y.o. male with medical history significant for bipolar disorder, chronic hepatitis C and history of alcoholism with cirrhosis, hypertension, and seizure disorder who presents to the emergency department for evaluation of a suspected seizure.   Discharge Diagnoses:  Principal Problem:   Pancytopenia ([place]) Active Problems:   CAD (coronary artery disease)   Seizure disorder ([place])   Hepatic cirrhosis ([place])   Bipolar disorder ([place])  1. Pancytopenia  - Patient has had pre-existing thrombocytopenia and one instance of mild leukopenia in 2015, but recent CBCs have featured normal H&H and normal WBC - the previous blood work was contaminated, and repeat blood work shows hemoglobin 12.4, platelets 52,000. Wbc is 3.6.  2. Seizure disorder  - Pt gives conflicting stories, but has history of tonic-clonic seizures that were previously witnessed in ED  - He is managed with Depakote 1,500 mg per day in 3 divided doses, but acknowledge inconsistent use during recent ED visit - Depakote level is 51 on presentation, the low end of therapeutic range  - Plan to continue current-dose Depakote, maintain seizure precautions,. - There is no focal neurologic deficit on admission   3. Bipolar disorder  - Pt reports increased depression lately, but denies SI, HI, or active hallucination s  - Continue Seroquel, Depakote, Buspar, Zoloft   4. COPD  - No  respiratory distress on admission, but occasional wheeze  - Continue prn albuterol    Discharge Instructions  Discharge Instructions    Diet - low sodium heart healthy    Complete by:  As directed    Discharge instructions    Complete by:  As directed    Follow up with PCP in one week. Follow up with neurology in 1 to 2 weeks.       Medication List    STOP taking these medications   meloxicam 15 MG tablet Commonly known as:  MOBIC   methocarbamol 500 MG tablet Commonly known as:  ROBAXIN     TAKE these medications   albuterol 108 (90 Base) MCG/ACT inhaler Commonly known as:  PROAIR HFA Inhale 2 puffs into the lungs every 6 (six) hours as needed for wheezing or shortness of breath.   aspirin EC 81 MG tablet Take 1 tablet (81 mg total) by mouth every morning.   atorvastatin 10 MG tablet Commonly known as:  LIPITOR Take 10 mg by mouth daily at 6 PM.   busPIRone 15 MG tablet Commonly known as:  BUSPAR Take 1 tablet (15 mg total) by mouth 3 (three) times daily as needed (For anxiety). What changed:  when to take this   divalproex 500 MG DR tablet Commonly known as:  DEPAKOTE Take 1 tablet (500 mg total) by mouth 3 (three) times daily. What changed:  Another medication with the same name was removed. Continue taking this medication, and follow the directions you see here.   hydrOXYzine 10 MG tablet Commonly known as:  ATARAX/VISTARIL TAKE 1 TABLET BY MOUTH 3 TIMES  DAILY AS NEEDED. What changed:  See the new instructions.   nitroGLYCERIN 0.4 MG SL tablet Commonly known as:  NITROSTAT Place 1 tablet (0.4 mg total) under the tongue every 5 (five) minutes as needed for chest pain.   QUEtiapine Fumarate 150 MG 24 hr tablet Commonly known as:  SEROQUEL XR Take 1 tablet (150 mg total) by mouth at bedtime.   sertraline 50 MG tablet Commonly known as:  ZOLOFT Take 50 mg by mouth daily.   tiZANidine 4 MG tablet Commonly known as:  ZANAFLEX   traZODone 100 MG  tablet Commonly known as:  DESYREL Take 1 tablet (100 mg total) by mouth at bedtime.      Follow-up Information    Kathlen Brunswick, PA-C. Schedule an appointment as soon as possible for a visit in 1 week(s).   Specialty:  Urgent Care Contact information: [place] [phone]        [place]. Schedule an appointment as soon as possible for a visit in 1 week(s).   Why:  for seizures Contact information: [address]     [place]  [phone]         No Known Allergies  Consultations:  none   Procedures/Studies: Dg Chest 2 View  Result Date: 04/15/2016 CLINICAL DATA:  Weight loss.  Smoking history. EXAM: CHEST  2 VIEW COMPARISON:  03/19/2016. CT chest 03/03/2016. Radiography 04/20/2014. FINDINGS: Heart size is normal. There is aortic atherosclerosis. Mediastinal shadows are otherwise normal. The lungs are clear. No effusions. No acute bone findings. IMPRESSION: No active disease.  Aortic atherosclerosis. Electronically Signed   By: Nelson Chimes M.D.   On: 04/15/2016 15:18       Subjective: No complaints.   Discharge Exam: Vitals:   04/27/16 0410 04/27/16 1324  BP: 135/80 107/67  Pulse: 94 74  Resp: 18 18  Temp: 98.2 F (36.8 C) 97.9 F (36.6 C)   Vitals:   04/26/16 2231 04/27/16 0012 04/27/16 0410 04/27/16 1324  BP: 117/77 128/81 135/80 107/67  Pulse: 66 73 94 74  Resp: 19 18 18 18   Temp:  98.3 F (36.8 C) 98.2 F (36.8 C) 97.9 F (36.6 C)  TempSrc:  Oral Oral Oral  SpO2: 97% 98% 94% 95%  Weight:   80.7 kg (177 lb 14.6 oz) 81.2 kg (179 lb 0.2 oz)  Height:   5\' 8"  (1.727 m) 5\' 8"  (1.727 m)    General: Pt is alert, awake, not in acute distress Cardiovascular: RRR, S1/S2 +, no rubs, no gallops Respiratory: CTA bilaterally, no wheezing, no rhonchi Abdominal: Soft, NT, ND, bowel sounds + Extremities: no edema, no cyanosis    The results of significant  diagnostics from this hospitalization (including imaging, microbiology, ancillary and laboratory) are listed below for reference.     Microbiology: No results found for this or any previous visit (from the past 240 hour(s)).   Labs: BNP (last 3 results)  Recent Labs  03/24/16 1352  BNP 123456*   Basic Metabolic Panel:  Recent Labs Lab 04/26/16 2026  NA 139  K 5.1  CL 106  CO2 23  GLUCOSE 93  BUN 15  CREATININE 0.98  CALCIUM 9.1   Liver Function Tests:  Recent Labs Lab 04/26/16 2026  AST 53*  ALT 25  ALKPHOS 51  BILITOT 1.7*  PROT 7.0  ALBUMIN 4.5   No results for input(s): LIPASE, AMYLASE in the last 168 hours. No results for input(s): AMMONIA  in the last 168 hours. CBC:  Recent Labs Lab 04/26/16 2102 04/26/16 2255  WBC 2.2* 4.4  NEUTROABS 1.4* 2.5  HGB 6.9* 12.1*  HCT 19.6* 34.2*  MCV 90.7 88.1  PLT 23* 44*   Cardiac Enzymes: No results for input(s): CKTOTAL, CKMB, CKMBINDEX, TROPONINI in the last 168 hours. BNP: Invalid input(s): POCBNP CBG:  Recent Labs Lab 04/27/16 0713  GLUCAP 107*   D-Dimer No results for input(s): DDIMER in the last 72 hours. Hgb A1c No results for input(s): HGBA1C in the last 72 hours. Lipid Profile No results for input(s): CHOL, HDL, LDLCALC, TRIG, CHOLHDL, LDLDIRECT in the last 72 hours. Thyroid function studies  Recent Labs  04/26/16 2255  TSH 2.154   Anemia work up  Recent Labs  04/26/16 2254 04/26/16 2255  VITAMINB12 641  --   FOLATE 14.1  --   FERRITIN 55  --   TIBC 311  --   IRON 98  --   RETICCTPCT  --  2.1   Urinalysis    Component Value Date/Time   COLORURINE AMBER (A) 04/26/2016 2049   APPEARANCEUR CLEAR 04/26/2016 2049   APPEARANCEUR Clear 06/08/2013 1510   LABSPEC 1.022 04/26/2016 2049   LABSPEC 1.012 06/08/2013 1510   PHURINE 6.5 04/26/2016 2049   GLUCOSEU NEGATIVE 04/26/2016 2049   GLUCOSEU Negative 06/08/2013 1510   HGBUR NEGATIVE 04/26/2016 2049   BILIRUBINUR SMALL (A)  04/26/2016 2049   BILIRUBINUR Negative 06/08/2013 1510   KETONESUR 15 (A) 04/26/2016 2049   PROTEINUR NEGATIVE 04/26/2016 2049   UROBILINOGEN 0.2 04/20/2014 2214   NITRITE NEGATIVE 04/26/2016 2049   LEUKOCYTESUR NEGATIVE 04/26/2016 2049   LEUKOCYTESUR Negative 06/08/2013 1510   Sepsis Labs Invalid input(s): PROCALCITONIN,  WBC,  LACTICIDVEN Microbiology No results found for this or any previous visit (from the past 240 hour(s)).   Time coordinating discharge: Over 30 minutes  SIGNED:   Hosie Poisson, MD  [facility] 04/27/2016, 1:49 PM Pager   If 7PM-7AM, please contact night-coverage [email or web address] Password [email or web address]

## 2016-04-28 ENCOUNTER — Other Ambulatory Visit: Payer: Self-pay | Admitting: Physician Assistant

## 2016-04-28 NOTE — Telephone Encounter (Signed)
03/2016 last ov 04/2016 last l;abs

## 2016-04-28 NOTE — Telephone Encounter (Signed)
Per his psych med list he is not prescribed this medication anymore. [name], MS, PA-C 4:30 PM, 04/28/2016

## 2016-05-01 NOTE — Progress Notes (Signed)
   04/27/16 1324  PT Time Calculation  PT Start Time (ACUTE ONLY) 1250  PT Stop Time (ACUTE ONLY) 1314  PT Time Calculation (min) (ACUTE ONLY) 24 min  PT G-Codes **NOT FOR INPATIENT CLASS**  Functional Assessment Tool Used clinical judgement  Functional Limitation Mobility: Walking and moving around  Mobility: Walking and Moving Around Current Status [id number]) CI  Mobility: Walking and Moving Around Goal Status [id number]) CI  Mobility: Walking and Moving Around Discharge Status [id number]) CI  PT General Charges  $$ ACUTE PT VISIT 1 Procedure  PT Evaluation  $PT Eval Low Complexity 1 Procedure   Weston Anna, MPT [phone]

## 2016-05-02 ENCOUNTER — Other Ambulatory Visit: Payer: Self-pay | Admitting: *Deleted

## 2016-05-02 ENCOUNTER — Telehealth: Payer: Self-pay | Admitting: Family Medicine

## 2016-05-02 DIAGNOSIS — J441 Chronic obstructive pulmonary disease with (acute) exacerbation: Secondary | ICD-10-CM

## 2016-05-02 DIAGNOSIS — G40909 Epilepsy, unspecified, not intractable, without status epilepticus: Secondary | ICD-10-CM

## 2016-05-02 NOTE — Patient Outreach (Signed)
[facility]) Care Management  05/02/2016  DELORIS POARCH 1955/09/22 [id number]   RN Health Coach telephone call to patient for telephone screening .  Hip[employer] compliance verified. Patient referred from  ED utilization list. Per patient he lives alone. Patient has support of two sisters that care and provide transportation as needed for Dr visits. Patient stated he is having uncontrolled seizures. He is currently on medication but states they are still occurring. Per patient he has COP with emphysema, HX of MI and is bipolar. Patient is seeing a therapist .  Plan:This case is referred to complex case management and to the pharmacy.  [place] Care Management [phone]

## 2016-05-02 NOTE — Telephone Encounter (Signed)
Pt states that the Trazodone isn't working and would like something else please respond

## 2016-05-03 DIAGNOSIS — Z7951 Long term (current) use of inhaled steroids: Secondary | ICD-10-CM | POA: Diagnosis not present

## 2016-05-03 DIAGNOSIS — Z791 Long term (current) use of non-steroidal anti-inflammatories (NSAID): Secondary | ICD-10-CM | POA: Diagnosis not present

## 2016-05-03 DIAGNOSIS — I110 Hypertensive heart disease with heart failure: Secondary | ICD-10-CM | POA: Diagnosis not present

## 2016-05-03 DIAGNOSIS — M1991 Primary osteoarthritis, unspecified site: Secondary | ICD-10-CM | POA: Diagnosis not present

## 2016-05-03 DIAGNOSIS — I509 Heart failure, unspecified: Secondary | ICD-10-CM | POA: Diagnosis not present

## 2016-05-03 DIAGNOSIS — Z7982 Long term (current) use of aspirin: Secondary | ICD-10-CM | POA: Diagnosis not present

## 2016-05-05 ENCOUNTER — Other Ambulatory Visit: Payer: Self-pay | Admitting: *Deleted

## 2016-05-05 DIAGNOSIS — Z791 Long term (current) use of non-steroidal anti-inflammatories (NSAID): Secondary | ICD-10-CM | POA: Diagnosis not present

## 2016-05-05 DIAGNOSIS — M1991 Primary osteoarthritis, unspecified site: Secondary | ICD-10-CM | POA: Diagnosis not present

## 2016-05-05 DIAGNOSIS — I110 Hypertensive heart disease with heart failure: Secondary | ICD-10-CM | POA: Diagnosis not present

## 2016-05-05 DIAGNOSIS — Z7951 Long term (current) use of inhaled steroids: Secondary | ICD-10-CM | POA: Diagnosis not present

## 2016-05-05 DIAGNOSIS — Z7982 Long term (current) use of aspirin: Secondary | ICD-10-CM | POA: Diagnosis not present

## 2016-05-05 DIAGNOSIS — I509 Heart failure, unspecified: Secondary | ICD-10-CM | POA: Diagnosis not present

## 2016-05-05 NOTE — Patient Outreach (Signed)
[place] [facility]) Care Management  05/05/2016  [name] Oct 22, 1955 [id number]   Initial outreach telephone call to patient  Referral Date 11/17 Diagnosis : Seizures,   Placed call to patient no answer, able to leave a HIPAA compliant message requesting a return call.  Plan Will await return call if no response will contact patient again in the next 4 business days.  Joylene Draft, RN, [place] Management [phone]- Mobile [phone]- Toll Free Main Office

## 2016-05-05 NOTE — Telephone Encounter (Signed)
Please have him take 1.5 tabs of trazodone to total 150 mg nightly. [name], MS, PA-C 1:25 PM, 05/05/2016

## 2016-05-07 ENCOUNTER — Other Ambulatory Visit: Payer: Self-pay | Admitting: Physician Assistant

## 2016-05-07 DIAGNOSIS — I110 Hypertensive heart disease with heart failure: Secondary | ICD-10-CM | POA: Diagnosis not present

## 2016-05-07 DIAGNOSIS — M1991 Primary osteoarthritis, unspecified site: Secondary | ICD-10-CM | POA: Diagnosis not present

## 2016-05-07 DIAGNOSIS — I509 Heart failure, unspecified: Secondary | ICD-10-CM | POA: Diagnosis not present

## 2016-05-07 DIAGNOSIS — Z7951 Long term (current) use of inhaled steroids: Secondary | ICD-10-CM | POA: Diagnosis not present

## 2016-05-07 DIAGNOSIS — Z791 Long term (current) use of non-steroidal anti-inflammatories (NSAID): Secondary | ICD-10-CM | POA: Diagnosis not present

## 2016-05-07 DIAGNOSIS — Z7982 Long term (current) use of aspirin: Secondary | ICD-10-CM | POA: Diagnosis not present

## 2016-05-07 MED ORDER — SUVOREXANT 15 MG PO TABS: 15.0000 mg | ORAL_TABLET | Freq: Every day | ORAL | 5 refills | Status: DC

## 2016-05-07 NOTE — Telephone Encounter (Signed)
Advised pt to try 1.5 tabs and he reported that he has already tried taking two tablets and it is not effective. He stated that it does not even help him go asleep for a short time. Please advise.

## 2016-05-07 NOTE — Telephone Encounter (Signed)
Im starting him on bellsomra.  Please call him and confirm his pharmacy.  I will write in an orders only encounter and please call in for me as he has difficulty with transportation. Please let him know to continue all of his current medications. [name], MS, PA-C 1:09 PM, 05/07/2016

## 2016-05-07 NOTE — Telephone Encounter (Signed)
Confirmed pt uses [facility]. Pt agreed to new Rx. Called in Rx as written.

## 2016-05-09 ENCOUNTER — Encounter: Payer: Self-pay | Admitting: *Deleted

## 2016-05-09 ENCOUNTER — Other Ambulatory Visit: Payer: Self-pay | Admitting: *Deleted

## 2016-05-09 NOTE — Patient Outreach (Signed)
[place] [facility]) Care Management  05/09/2016  [name] 1956-06-03 [id number]  Referral received 11/17 Referral Diagnosis: COPD, Seizures   Placed call to patient for initial telephone assessment. [name] discussed his concerns related to COPD when he states is better controlled in the winter and increase in occurrence of seizure.  Patient discussed that he has a upcoming appointment with neurologist.  [name] states he has all of his medications and denies difficulty in purchasing medications discussed that he has medicare and medicaid. Patient states that his sister fills his medication organizer about every 2 weeks, states problem sometimes medication refills run out at different times. Patient states he gets his prescriptions at [facility] and they deliver.Patient unable to review list of medication at this time.  Patient does not drive and relies on his sisters for transportation to appointments, states he has used transportation services provided by his insurance but he does not like using them, it ties up his entire day.   Patient agreeable to a home visit.Patient would like information on managing  COPD and seizures.   Plan  Scheduled home visit for next week and will address patient centered goal setting.   Joylene Draft, RN, [place] Management [phone]- Mobile [phone]- Toll Free Main Office

## 2016-05-13 DIAGNOSIS — I509 Heart failure, unspecified: Secondary | ICD-10-CM | POA: Diagnosis not present

## 2016-05-13 DIAGNOSIS — Z7951 Long term (current) use of inhaled steroids: Secondary | ICD-10-CM | POA: Diagnosis not present

## 2016-05-13 DIAGNOSIS — Z7982 Long term (current) use of aspirin: Secondary | ICD-10-CM | POA: Diagnosis not present

## 2016-05-13 DIAGNOSIS — I110 Hypertensive heart disease with heart failure: Secondary | ICD-10-CM | POA: Diagnosis not present

## 2016-05-13 DIAGNOSIS — M1991 Primary osteoarthritis, unspecified site: Secondary | ICD-10-CM | POA: Diagnosis not present

## 2016-05-13 DIAGNOSIS — Z791 Long term (current) use of non-steroidal anti-inflammatories (NSAID): Secondary | ICD-10-CM | POA: Diagnosis not present

## 2016-05-15 ENCOUNTER — Other Ambulatory Visit: Payer: Self-pay | Admitting: *Deleted

## 2016-05-15 ENCOUNTER — Other Ambulatory Visit: Payer: Self-pay | Admitting: Physician Assistant

## 2016-05-15 ENCOUNTER — Encounter: Payer: Self-pay | Admitting: *Deleted

## 2016-05-15 ENCOUNTER — Other Ambulatory Visit: Payer: Self-pay

## 2016-05-15 DIAGNOSIS — Z7982 Long term (current) use of aspirin: Secondary | ICD-10-CM | POA: Diagnosis not present

## 2016-05-15 DIAGNOSIS — I509 Heart failure, unspecified: Secondary | ICD-10-CM | POA: Diagnosis not present

## 2016-05-15 DIAGNOSIS — Z791 Long term (current) use of non-steroidal anti-inflammatories (NSAID): Secondary | ICD-10-CM | POA: Diagnosis not present

## 2016-05-15 DIAGNOSIS — I110 Hypertensive heart disease with heart failure: Secondary | ICD-10-CM | POA: Diagnosis not present

## 2016-05-15 DIAGNOSIS — Z7951 Long term (current) use of inhaled steroids: Secondary | ICD-10-CM | POA: Diagnosis not present

## 2016-05-15 DIAGNOSIS — M1991 Primary osteoarthritis, unspecified site: Secondary | ICD-10-CM | POA: Diagnosis not present

## 2016-05-15 MED ORDER — ALBUTEROL SULFATE (2.5 MG/3ML) 0.083% IN NEBU: 2.5000 mg | INHALATION_SOLUTION | Freq: Four times a day (QID) | RESPIRATORY_TRACT | 1 refills | Status: DC | PRN

## 2016-05-15 NOTE — Patient Outreach (Signed)
[facility]) Care Management   05/16/2016  ASAHEL GREALISH 09/04/1955 [id number]  [name] is an 60 y.o. male   Initial home visit   Subjective:  Patient reports feeling alright on today. Advanced home care RN and physical therapist has visited on today, patient reports he had a good walk with therapist. Patient discussed his medical history and concern regarding seizures, and upcoming follow up with appointment with  neurologist and cardiologist.  Patient states his sisters provide transportation to visits ,as he does not like using the medicaid transportation that he has used in the past. Patient discussed being a little bored around the house now, unable to drive due to seizures. Patient discussed his weight loss, reports he eats about 2 meals a day, one of them being the meals on wheels that he receives and he prepares something later on in the day. Patient reports keeping his rescue inhaler with him at all times and has not had to use it recently. He also has nitroglycerin that states he keeps with him also, denies having chest pain.    Objective:  BP 100/76 (BP Location: Right Arm, Patient Position: Sitting)   Pulse 61   Resp 18   SpO2 96%  Review of Systems  Constitutional: Negative.   HENT: Negative.   Eyes: Negative.   Respiratory: Negative.   Cardiovascular: Negative.   Gastrointestinal: Negative.   Genitourinary: Negative.   Musculoskeletal: Positive for back pain.  Skin: Negative.   Neurological: Positive for dizziness.       Complaint of dizziness upon first standing at times   Endo/Heme/Allergies: Negative.   Psychiatric/Behavioral: Positive for depression. Negative for suicidal ideas.    Physical Exam  Constitutional: He is oriented to person, place, and time. He appears well-developed and well-nourished.  Cardiovascular: Normal rate, normal heart sounds and intact distal pulses.   Respiratory: Effort normal and breath sounds normal.   GI: Soft.  Neurological: He is alert and oriented to person, place, and time.  Skin: Skin is warm and dry.  Multiple tatoo  Psychiatric: He has a normal mood and affect. His behavior is normal. Judgment and thought content normal.    Encounter Medications:   Outpatient Encounter Prescriptions as of 05/15/2016  Medication Sig  . albuterol (PROAIR HFA) 108 (90 Base) MCG/ACT inhaler Inhale 2 puffs into the lungs every 6 (six) hours as needed for wheezing or shortness of breath.  [name] aspirin EC 81 MG tablet Take 1 tablet (81 mg total) by mouth every morning.  [name] atorvastatin (LIPITOR) 10 MG tablet Take 10 mg by mouth daily at 6 PM.   . B Complex-C (SUPER B COMPLEX/VITAMIN C PO) Take 1 tablet by mouth daily.  . clonazePAM (KLONOPIN) 0.5 MG tablet Take 0.5 mg by mouth at bedtime as needed for anxiety.  . divalproex (DEPAKOTE) 500 MG DR tablet Take 1 tablet (500 mg total) by mouth 3 (three) times daily.  . meloxicam (MOBIC) 15 MG tablet Take 15 mg by mouth daily.  . nitroGLYCERIN (NITROSTAT) 0.4 MG SL tablet Place 1 tablet (0.4 mg total) under the tongue every 5 (five) minutes as needed for chest pain.  [name] QUEtiapine Fumarate (SEROQUEL XR) 150 MG 24 hr tablet Take 1 tablet (150 mg total) by mouth at bedtime.  . sertraline (ZOLOFT) 50 MG tablet Take 50 mg by mouth daily.   . Suvorexant (BELSOMRA) 15 MG TABS Take 15 mg by mouth at bedtime.  [name] tiZANidine (ZANAFLEX) 4 MG tablet   .  traZODone (DESYREL) 100 MG tablet Take 1 tablet (100 mg total) by mouth at bedtime.  . [DISCONTINUED] busPIRone (BUSPAR) 15 MG tablet Take 1 tablet (15 mg total) by mouth 3 (three) times daily as needed (For anxiety). (Patient taking differently: Take 15 mg by mouth 3 (three) times daily. )  . [DISCONTINUED] hydrOXYzine (ATARAX/VISTARIL) 10 MG tablet TAKE 1 TABLET BY MOUTH 3 TIMES DAILY AS NEEDED. (Patient taking differently: TAKE 1 TABLET BY MOUTH 3 TIMES DAILY)   No facility-administered encounter medications on file as of  05/15/2016.     Functional Status:   In your present state of health, do you have any difficulty performing the following activities: 05/15/2016 04/26/2016  Hearing? N N  Vision? N N  Difficulty concentrating or making decisions? Y N  Walking or climbing stairs? Y N  Dressing or bathing? N N  Doing errands, shopping? Y N  Preparing Food and eating ? N -  Using the Toilet? N -  In the past six months, have you accidently leaked urine? N -  Do you have problems with loss of bowel control? N -  Managing your Medications? Y -  Managing your Finances? Y -  Housekeeping or managing your Housekeeping? Y -  Some recent data might be hidden    Fall/Depression Screening:    PHQ 2/9 Scores 05/02/2016 04/15/2016 02/11/2016 01/16/2016 12/19/2015 06/20/2015 05/30/2015  PHQ - 2 Score 6 0 0 4 0 2 3  PHQ- 9 Score 18 - - 13 - 9 19   Fall Risk  05/15/2016 04/15/2016 02/11/2016 01/16/2016 12/19/2015  Falls in the past year? Yes No No No No  Number falls in past yr: 2 or more - - - -  Injury with Fall? No - - - -  Risk Factor Category  High Fall Risk - - - -  Risk for fall due to : History of fall(s) - - Impaired mobility -  Follow up Falls evaluation completed;Falls prevention discussed - - - -   Assessment:  Initial home visit ,   1. Explained [facility] program, provided packet and reviewed, consent signed. 2. No Advance directives 3. Patient concern regarding recent seizure activity, has upcoming appointment with neurologist 4. Home safety evaluation - High fall risk, dizziness upon first standing, noted some weakness,dipping of flooring in bedroom area, patient reports he is planning to repair. 5. Patient concern regarding recent weight loss 6. Patient medication management - medication due for refills on different schedules makes is difficult at times with filling pill organizer. 7. Patient concern regarding weight loss- like ensure that he received while hospitalized  8. COPD- in green zone after explained  will benefit from education .  9.  History of Bipolar - follows up at [facility] , patient interested in other opportunities for socializing with others.  Plan:  1.Consent signed for [facility] 2.Provided Advanced directive packet, patient states his sister and niece will help him 3.Encouraged patient to keep upcoming MD appointment 4.Patient centered goal care planning and goal setting. 5. Discussed importance of balanced diet 6.[facility] pharmacy consult pending  7. Discussed benefits of nutrition supplement drink such as ensure 8. Will provide education on action plan  9. Will discuss with [facility] social worker  community resources to help increase socialization and place consult as needed. Provided and reviewed EMMI education handout on seizures. Plan follow up with patient by phone in the next 2 weeks and home visit in the next month.   [facility] CM Care Plan Problem One  Flowsheet Row Most Recent Value  Care Plan Problem One  Patient with high risk for fall   Role Documenting the Problem One  Care Management Mobile for Problem One  Active  [facility] Long Term Goal (31-90 days)  Patient will report no falls in the next 60 days   [facility]N Long Term Goal Start Date  05/15/16  Interventions for Problem One Long Term Goal  Reviewed importance of following safety fall precautions , reviewed [facility] falls safety checklist handout, and stressed importance of upon rising to stand a few seconds before starting to walk   [facility] CM Short Term Goal #1 (0-30 days)  Patient will report beginning to use his cane in the next 30 days   [facility] CM Short Term Goal #1 Start Date  05/15/16  Interventions for Short Term Goal #1  Reviwed with patient how assistive devices help with preventing falls and help balance   [facility] CM Short Term Goal #2 (0-30 days)  Patient will report completing home therapy exercises at least 5 days a week in the next 30 days   [facility] CM Short Term Goal #2 Start Date  05/15/16  Interventions for Short Term Goal #2   Verifed patient has list of exercises from Home health, encouraged participation   [facility] CM Short Term Goal #3 (0-30 days)  Patient will report drinking one ensure daily between meal as a supplement in the next 30 days    [facility] CM Short Term Goal #3 Start Date  05/15/16  Interventions for Short Tern Goal #3  Discussed with patient importance of balanced diet to help improved strenght and supplement such as ensure provide extra nutrients to supplment foods that he eats, provided coupons     [facility] CM Care Plan Problem Two   Flowsheet Row Most Recent Value  Care Plan Problem Two  Knowledge deficit related to COPD self care manamgment   Role Documenting the Problem Two  Care Management Coordinator  Care Plan for Problem Two  Active  Interventions for Problem Two Long Term Goal   Reviewed importance of consistent MD follow up, taking medications as prescribed.  [facility]N Long Term Goal (31-90) days  Patient will report increased knowledge of COPD self care manangement in the next 60 days   [facility]N Long Term Goal Start Date  05/15/16  [facility] CM Short Term Goal #1 (0-30 days)  Patient will be able to indentify yellow zone symptoms in the next 30 days   [facility] CM Short Term Goal #1 Start Date  05/15/16  Interventions for Short Term Goal #2   Reviewed verbally all zones and provided COPD zone magnet   [facility] CM Short Term Goal #2 (0-30 days)  Patient will be able to state action plan for COPD zones in the next 30 days   [facility] CM Short Term Goal #2 Start Date  05/16/16  Interventions for Short Term Goal #2  Reviewed EMMI handout of COPD what to do , and actions to take for each plan      Joylene Draft, RN, [place] [facility] [phone]- Mobile [phone]- [place]

## 2016-05-15 NOTE — Telephone Encounter (Signed)
Patient needs his albuterol (PROAIR HFA) 108 (90 Base) MCG/ACT inhaler refilled states he now uses  [facility].  His call back number is [phone]

## 2016-05-15 NOTE — Telephone Encounter (Signed)
04/15/16 last ov and refill with 6 additional. L/m to have rx transferred from old pharmacy to new one, he has 6 refills on last refill.

## 2016-05-20 ENCOUNTER — Encounter: Payer: Self-pay | Admitting: Neurology

## 2016-05-20 ENCOUNTER — Ambulatory Visit (INDEPENDENT_AMBULATORY_CARE_PROVIDER_SITE_OTHER): Payer: Medicare Other | Admitting: Neurology

## 2016-05-20 VITALS — BP 95/58 | HR 59 | Ht 68.0 in | Wt 185.0 lb

## 2016-05-20 DIAGNOSIS — R569 Unspecified convulsions: Secondary | ICD-10-CM

## 2016-05-20 DIAGNOSIS — M1991 Primary osteoarthritis, unspecified site: Secondary | ICD-10-CM | POA: Diagnosis not present

## 2016-05-20 DIAGNOSIS — Z791 Long term (current) use of non-steroidal anti-inflammatories (NSAID): Secondary | ICD-10-CM | POA: Diagnosis not present

## 2016-05-20 DIAGNOSIS — I110 Hypertensive heart disease with heart failure: Secondary | ICD-10-CM | POA: Diagnosis not present

## 2016-05-20 DIAGNOSIS — Z7951 Long term (current) use of inhaled steroids: Secondary | ICD-10-CM | POA: Diagnosis not present

## 2016-05-20 DIAGNOSIS — I509 Heart failure, unspecified: Secondary | ICD-10-CM | POA: Diagnosis not present

## 2016-05-20 DIAGNOSIS — Z7982 Long term (current) use of aspirin: Secondary | ICD-10-CM | POA: Diagnosis not present

## 2016-05-20 NOTE — Patient Instructions (Signed)
Remember to drink plenty of fluid, eat healthy meals and do not skip any meals. Try to eat protein with a every meal and eat a healthy snack such as fruit or nuts in between meals. Try to keep a regular sleep-wake schedule and try to exercise daily, particularly in the form of walking, 20-30 minutes a day, if you can.   As far as your medications are concerned, I would like to suggest: Continue Depakote  As far as diagnostic testing: EEG  I would like to see you back in 6 months, sooner if we need to. Please call us with any interim questions, concerns, problems, updates or refill requests.    Our phone number is [phone]. We also have an after hours call service for urgent matters and there is a physician on-call for urgent questions. For any emergencies you know to call 911 or go to the nearest emergency room
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
## 2016-05-22 DIAGNOSIS — I9589 Other hypotension: Secondary | ICD-10-CM | POA: Diagnosis not present

## 2016-05-22 DIAGNOSIS — I509 Heart failure, unspecified: Secondary | ICD-10-CM | POA: Diagnosis not present

## 2016-05-22 DIAGNOSIS — E7800 Pure hypercholesterolemia, unspecified: Secondary | ICD-10-CM | POA: Diagnosis not present

## 2016-05-22 DIAGNOSIS — I2510 Atherosclerotic heart disease of native coronary artery without angina pectoris: Secondary | ICD-10-CM | POA: Diagnosis not present

## 2016-05-22 DIAGNOSIS — Z7982 Long term (current) use of aspirin: Secondary | ICD-10-CM | POA: Diagnosis not present

## 2016-05-22 DIAGNOSIS — I110 Hypertensive heart disease with heart failure: Secondary | ICD-10-CM | POA: Diagnosis not present

## 2016-05-22 DIAGNOSIS — M1991 Primary osteoarthritis, unspecified site: Secondary | ICD-10-CM | POA: Diagnosis not present

## 2016-05-22 DIAGNOSIS — I5032 Chronic diastolic (congestive) heart failure: Secondary | ICD-10-CM | POA: Diagnosis not present

## 2016-05-22 DIAGNOSIS — Z7951 Long term (current) use of inhaled steroids: Secondary | ICD-10-CM | POA: Diagnosis not present

## 2016-05-22 DIAGNOSIS — Z791 Long term (current) use of non-steroidal anti-inflammatories (NSAID): Secondary | ICD-10-CM | POA: Diagnosis not present

## 2016-05-29 ENCOUNTER — Other Ambulatory Visit: Payer: Self-pay | Admitting: *Deleted

## 2016-05-29 ENCOUNTER — Encounter: Payer: Self-pay | Admitting: *Deleted

## 2016-05-29 DIAGNOSIS — M1991 Primary osteoarthritis, unspecified site: Secondary | ICD-10-CM | POA: Diagnosis not present

## 2016-05-29 DIAGNOSIS — I110 Hypertensive heart disease with heart failure: Secondary | ICD-10-CM | POA: Diagnosis not present

## 2016-05-29 DIAGNOSIS — Z791 Long term (current) use of non-steroidal anti-inflammatories (NSAID): Secondary | ICD-10-CM | POA: Diagnosis not present

## 2016-05-29 DIAGNOSIS — Z7982 Long term (current) use of aspirin: Secondary | ICD-10-CM | POA: Diagnosis not present

## 2016-05-29 DIAGNOSIS — I509 Heart failure, unspecified: Secondary | ICD-10-CM | POA: Diagnosis not present

## 2016-05-29 DIAGNOSIS — Z7951 Long term (current) use of inhaled steroids: Secondary | ICD-10-CM | POA: Diagnosis not present

## 2016-05-29 NOTE — Patient Outreach (Signed)
[facility]) Care Management  05/29/2016  [name] 05-11-1956 [id number]  Follow up telephone   Spoke with patient,[place] information verified. [name] reports he is doing pretty good on today. Patient discussed his recent visit with neurologist regarding his seizures  [name] denies that he drinks alcohol, beer states he quit over a year ago as part of his probation, and denies need for alcohol rehab class.  Patient discussed that he continues to take his medication as prescribed,discussing importance of taking his seizure medication .  [name] discussed being a little down during the holiday season, and states he hasn't gotten over his mother's death that occurred over a year ago. Patient voiced interest in being able to attend program with others,discussed being  bored at home, have lost interest in some of the activities that he used to do,  does not have license to drive due to seizures.  Plan Will place LCSW consult for local programs to increase socialization for patient  Benedetto Coons [place] [facility] [phone]- Mobile [phone]- [place]

## 2016-06-03 ENCOUNTER — Other Ambulatory Visit: Payer: Self-pay | Admitting: *Deleted

## 2016-06-12 ENCOUNTER — Ambulatory Visit (INDEPENDENT_AMBULATORY_CARE_PROVIDER_SITE_OTHER): Payer: Medicare Other | Admitting: Physician Assistant

## 2016-06-12 ENCOUNTER — Encounter: Payer: Self-pay | Admitting: Physician Assistant

## 2016-06-12 ENCOUNTER — Other Ambulatory Visit: Payer: Self-pay | Admitting: *Deleted

## 2016-06-12 VITALS — BP 112/66 | HR 83 | Temp 97.7°F | Resp 18 | Ht 68.0 in | Wt 194.0 lb

## 2016-06-12 DIAGNOSIS — G40409 Other generalized epilepsy and epileptic syndromes, not intractable, without status epilepticus: Secondary | ICD-10-CM

## 2016-06-12 DIAGNOSIS — F331 Major depressive disorder, recurrent, moderate: Secondary | ICD-10-CM

## 2016-06-12 MED ORDER — DIVALPROEX SODIUM 500 MG PO DR TAB: 500.0000 mg | DELAYED_RELEASE_TABLET | Freq: Three times a day (TID) | ORAL | 3 refills | Status: DC
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
## 2016-06-12 NOTE — Patient Instructions (Addendum)
  TAKE 2 SERTRALINE PER DAY AND COME BACK TO OFFICE IN 2 WEEKS   IF you received an x-ray today, you will receive an invoice from [facility]. Please contact [facility] at [phone] with questions or concerns regarding your invoice.   IF you received labwork today, you will receive an invoice from [facility]. Please contact [facility] at [phone] with questions or concerns regarding your invoice.   Our billing staff will not be able to assist you with questions regarding bills from these companies.  You will be contacted with the lab results as soon as they are available. The fastest way to get your results is to activate your My Chart account. Instructions are located on the last page of this paperwork. If you have not heard from [place] regarding the results in 2 weeks, please contact this office.

## 2016-06-12 NOTE — Patient Outreach (Signed)
[facility]) Care Management   06/12/2016  LEKENDRICK DIPPOLD 06-05-1956 [id number]  BASEL REHKOP is an 60 y.o. male  Subjective:  Patient reports doing pretty good. Denies having seizure activity. Patient denies dizziness, reports he has completed home health physical therapy 2 weeks ago.  Patient reports that he has not smoked cigar or cigarette in about 3 weeks.   Patient discussed that he was able to fill his own pill box, this week due to his sister not available still concern regarding medications needing refills at different times. Patient's expressed he likes getting medication from this pharmacy because they deliver to his home.   Discussed this holiday season he thinks more about his mother that passed during this season a few years ago.    Objective:  BP 100/60 (BP Location: Right Arm, Patient Position: Sitting)   Pulse 60   Resp 18   SpO2 96%  Review of Systems  Constitutional: Negative.   HENT: Negative.   Eyes: Negative.   Respiratory: Negative.   Cardiovascular: Negative.   Gastrointestinal: Negative.   Genitourinary: Negative.   Musculoskeletal: Positive for back pain.  Skin: Negative.   Neurological: Negative.  Negative for seizures.  Endo/Heme/Allergies: Negative.   Psychiatric/Behavioral: Positive for depression.    Physical Exam  Constitutional: He is oriented to person, place, and time. He appears well-developed and well-nourished.  Cardiovascular: Normal rate, normal heart sounds and intact distal pulses.   Respiratory: Effort normal and breath sounds normal.  GI: Soft.  Neurological: He is alert and oriented to person, place, and time.  Skin: Skin is warm and dry.  Psychiatric: He has a normal mood and affect. His behavior is normal. Judgment and thought content normal.    Encounter Medications:   Outpatient Encounter Prescriptions as of 06/12/2016  Medication Sig  . albuterol (PROAIR HFA) 108 (90 Base) MCG/ACT inhaler Inhale  2 puffs into the lungs every 6 (six) hours as needed for wheezing or shortness of breath.  [name] albuterol (PROVENTIL) (2.5 MG/3ML) 0.083% nebulizer solution Take 3 mLs (2.5 mg total) by nebulization every 6 (six) hours as needed for wheezing or shortness of breath.  [name] aspirin EC 81 MG tablet Take 1 tablet (81 mg total) by mouth every morning.  [name] atorvastatin (LIPITOR) 10 MG tablet Take 10 mg by mouth daily at 6 PM.   . B Complex-C (SUPER B COMPLEX/VITAMIN C PO) Take 1 tablet by mouth daily.  . busPIRone (BUSPAR) 15 MG tablet Take 15 mg by mouth 3 (three) times daily.  . clonazePAM (KLONOPIN) 0.5 MG tablet Take 0.5 mg by mouth at bedtime as needed for anxiety.  . divalproex (DEPAKOTE) 500 MG DR tablet Take 1 tablet (500 mg total) by mouth 3 (three) times daily.  [name] HYDROXYZINE HCL PO Take 10 mg by mouth 3 (three) times daily.  . meloxicam (MOBIC) 15 MG tablet Take 15 mg by mouth daily.  . nitroGLYCERIN (NITROSTAT) 0.4 MG SL tablet Place 1 tablet (0.4 mg total) under the tongue every 5 (five) minutes as needed for chest pain.  [name] QUEtiapine Fumarate (SEROQUEL XR) 150 MG 24 hr tablet Take 1 tablet (150 mg total) by mouth at bedtime.  . sertraline (ZOLOFT) 50 MG tablet Take 50 mg by mouth daily.   . Suvorexant (BELSOMRA) 15 MG TABS Take 15 mg by mouth at bedtime.  [name] tiZANidine (ZANAFLEX) 4 MG tablet Take 4 mg by mouth 3 (three) times daily. 2-3 times daily  . traZODone (DESYREL) 100 MG  tablet Take 1 tablet (100 mg total) by mouth at bedtime.   No facility-administered encounter medications on file as of 06/12/2016.     Functional Status:   In your present state of health, do you have any difficulty performing the following activities: 05/15/2016 04/26/2016  Hearing? N N  Vision? N N  Difficulty concentrating or making decisions? Y N  Walking or climbing stairs? Y N  Dressing or bathing? N N  Doing errands, shopping? Y N  Preparing Food and eating ? N -  Using the Toilet? N -  In the past six  months, have you accidently leaked urine? N -  Do you have problems with loss of bowel control? N -  Managing your Medications? Y -  Managing your Finances? Y -  Housekeeping or managing your Housekeeping? Y -  Some recent data might be hidden    Fall/Depression Screening:    PHQ 2/9 Scores 05/02/2016 04/15/2016 02/11/2016 01/16/2016 12/19/2015 06/20/2015 05/30/2015  PHQ - 2 Score 6 0 0 4 0 2 3  PHQ- 9 Score 18 - - 13 - 9 19   Fall Risk  06/12/2016 06/12/2016 05/15/2016 04/15/2016 02/11/2016  Falls in the past year? Yes Yes Yes No No  Number falls in past yr: 2 or more 2 or more 2 or more - -  Injury with Fall? Yes - No - -  Risk Factor Category  - - High Fall Risk - -  Risk for fall due to : History of fall(s);Other (Comment) - History of fall(s) - -  Follow up - - Falls evaluation completed;Falls prevention discussed - -   Assessment:    Falls - no recent falls, continuing with home exercise program started by home health therapy  Seizures - No recurrent seizures, taking medications as prescribed.Denies drinking alcohol  COPD - in green zone, no smoking in last 3 weeks, denies use of rescue inhaler or nebulizer treatment.   Medications - patient still has problem with med refills ending at different making fill box difficult at times, blister packaging would be beneficial, if not available would benefit from consistent help with filling organizer.   Depression- positive , reports medication compliance may benefit from counseling    During this home visit patient received phone call from [facility] from [place] ( Community health response program) [phone]  she discussed with patient that if his medications are filled on a 90 day supply they will be able to come to patient home and fill pill organizer on a monthly basis. Patient visit PCP visit on today, his sister to provide transportation .  Plan:  Will follow up with patient with home visit in next month, will follow up by  telephone in the next 2 weeks.  Placed call to [facility] , they do not do blister packaging, they will be able sync prescriptions, explained this to patient he will  decide if he wants to proceed with this and RN will assist as needed. Will update [facility] pharmacy by in basket regarding patient concern and possible CHRP follow up.  Will send this visit note to PCP.  Joylene Draft, RN, [place] Management [phone]- Mobile [phone]- Toll Free Main Office

## 2016-06-13 ENCOUNTER — Other Ambulatory Visit: Payer: Self-pay | Admitting: Physician Assistant

## 2016-06-13 ENCOUNTER — Telehealth: Payer: Self-pay | Admitting: Physician Assistant

## 2016-06-13 DIAGNOSIS — I509 Heart failure, unspecified: Secondary | ICD-10-CM | POA: Diagnosis not present

## 2016-06-13 DIAGNOSIS — Z7951 Long term (current) use of inhaled steroids: Secondary | ICD-10-CM | POA: Diagnosis not present

## 2016-06-13 DIAGNOSIS — Z791 Long term (current) use of non-steroidal anti-inflammatories (NSAID): Secondary | ICD-10-CM | POA: Diagnosis not present

## 2016-06-13 DIAGNOSIS — I110 Hypertensive heart disease with heart failure: Secondary | ICD-10-CM | POA: Diagnosis not present

## 2016-06-13 DIAGNOSIS — Z7982 Long term (current) use of aspirin: Secondary | ICD-10-CM | POA: Diagnosis not present

## 2016-06-13 DIAGNOSIS — D696 Thrombocytopenia, unspecified: Secondary | ICD-10-CM

## 2016-06-13 DIAGNOSIS — M1991 Primary osteoarthritis, unspecified site: Secondary | ICD-10-CM | POA: Diagnosis not present

## 2016-06-13 LAB — CMP14+EGFR
ALT: 32 IU/L (ref 0–44)
AST: 61 IU/L — ABNORMAL HIGH (ref 0–40)
Albumin/Globulin Ratio: 2 (ref 1.2–2.2)
Albumin: 4.1 g/dL (ref 3.6–4.8)
Alkaline Phosphatase: 55 IU/L (ref 39–117)
BUN/Creatinine Ratio: 28 — ABNORMAL HIGH (ref 10–24)
BUN: 24 mg/dL (ref 8–27)
Bilirubin Total: 0.7 mg/dL (ref 0.0–1.2)
CO2: 23 mmol/L (ref 18–29)
Calcium: 9.3 mg/dL (ref 8.6–10.2)
Chloride: 103 mmol/L (ref 96–106)
Creatinine, Ser: 0.86 mg/dL (ref 0.76–1.27)
GFR calc Af Amer: 107 mL/min/{1.73_m2} (ref >59)
GFR calc non Af Amer: 93 mL/min/{1.73_m2} (ref >59)
Globulin, Total: 2.1 g/dL (ref 1.5–4.5)
Glucose: 90 mg/dL (ref 65–99)
Potassium: 4 mmol/L (ref 3.5–5.2)
Sodium: 144 mmol/L (ref 134–144)
Total Protein: 6.2 g/dL (ref 6.0–8.5)

## 2016-06-13 LAB — CBC
Hematocrit: 37.4 % — ABNORMAL LOW (ref 37.5–51.0)
Hemoglobin: 12.9 g/dL — ABNORMAL LOW (ref 13.0–17.7)
MCH: 31.9 pg (ref 26.6–33.0)
MCHC: 34.5 g/dL (ref 31.5–35.7)
MCV: 93 fL (ref 79–97)
Platelets: 44 10*3/uL — CL (ref 150–379)
RBC: 4.04 x10E6/uL — ABNORMAL LOW (ref 4.14–5.80)
RDW: 15.5 % — ABNORMAL HIGH (ref 12.3–15.4)
WBC: 3.8 10*3/uL (ref 3.4–10.8)

## 2016-06-13 NOTE — Telephone Encounter (Signed)
LM for patient on machine to call me back.  Advised that he stop taking his ASA.  I am going to have him see hematology of persistently low platelets.  He has splenomegaly and an history of Hep C in remission. He denies drinking however alcoholism remains on the differential.  [name], MS, PA-C 10:38 AM, 06/13/2016

## 2016-06-17 NOTE — Patient Outreach (Signed)
[place] [facility]) Care Management  [facility] Social Work  06/17/2016  [name] 1956-04-10 [id number]  Subjective:  Patient reports "lots of loss" over the years.    Objective:  Patient open and engaged and wiling to consider resources to aide in his overall mental health.  Encounter Medications:  Outpatient Encounter Prescriptions as of 06/12/2016  Medication Sig Note  . albuterol (PROAIR HFA) 108 (90 Base) MCG/ACT inhaler Inhale 2 puffs into the lungs every 6 (six) hours as needed for wheezing or shortness of breath.   [name] albuterol (PROVENTIL) (2.5 MG/3ML) 0.083% nebulizer solution Take 3 mLs (2.5 mg total) by nebulization every 6 (six) hours as needed for wheezing or shortness of breath.   [name] atorvastatin (LIPITOR) 10 MG tablet Take 10 mg by mouth daily at 6 PM.    . B Complex-C (SUPER B COMPLEX/VITAMIN C PO) Take 1 tablet by mouth daily.   . busPIRone (BUSPAR) 15 MG tablet Take 15 mg by mouth 3 (three) times daily.   . clonazePAM (KLONOPIN) 0.5 MG tablet Take 0.5 mg by mouth at bedtime as needed for anxiety.   . divalproex (DEPAKOTE) 500 MG DR tablet Take 1 tablet (500 mg total) by mouth 3 (three) times daily.   . hydrOXYzine (ATARAX/VISTARIL) 10 MG tablet  06/12/2016: Received from: External Pharmacy  . HYDROXYZINE HCL PO Take 10 mg by mouth 3 (three) times daily.   . meloxicam (MOBIC) 15 MG tablet Take 15 mg by mouth daily.   . nitroGLYCERIN (NITROSTAT) 0.4 MG SL tablet Place 1 tablet (0.4 mg total) under the tongue every 5 (five) minutes as needed for chest pain.   [name] QUEtiapine Fumarate (SEROQUEL XR) 150 MG 24 hr tablet Take 1 tablet (150 mg total) by mouth at bedtime.   . sertraline (ZOLOFT) 50 MG tablet Take 100 mg by mouth daily.   . Suvorexant (BELSOMRA) 15 MG TABS Take 15 mg by mouth at bedtime.   [name] tiZANidine (ZANAFLEX) 4 MG tablet Take 4 mg by mouth 3 (three) times daily. 2-3 times daily   . traZODone (DESYREL) 100 MG tablet Take 1 tablet (100 mg total) by  mouth at bedtime.   . [DISCONTINUED] aspirin EC 81 MG tablet Take 1 tablet (81 mg total) by mouth every morning.    No facility-administered encounter medications on file as of 06/12/2016.     Functional Status:  In your present state of health, do you have any difficulty performing the following activities: 05/15/2016 04/26/2016  Hearing? N N  Vision? N N  Difficulty concentrating or making decisions? Y N  Walking or climbing stairs? Y N  Dressing or bathing? N N  Doing errands, shopping? Y N  Preparing Food and eating ? N -  Using the Toilet? N -  In the past six months, have you accidently leaked urine? N -  Do you have problems with loss of bowel control? N -  Managing your Medications? Y -  Managing your Finances? Y -  Housekeeping or managing your Housekeeping? Y -  Some recent data might be hidden    Fall/Depression Screening:  PHQ 2/9 Scores 06/12/2016 06/12/2016 05/02/2016 04/15/2016 02/11/2016 01/16/2016 12/19/2015  PHQ - 2 Score _0 0 0 4 0  PHQ- 9 Score _1 - - 13 -  Exception Documentation Medical reason - - - - - -    Assessment: CSW met with patient in his home along with [name], RNCM, to complete initial assessment.  Patient  was pleasant, engaged and open to discussion about his grief, loss, PTSD and overall wellness.   Patient was open to CSW providing resources for him to consider for mental health support.  He also reports being limited with his reading ability and is open to considering support/services to help him read better.  He [facility] to [facility] for mental health RX and is open to counseling there as well.   Plan: CSW will provide patient with support and link him with community resources to assist him with mental health needs, reading, transportation, food  and other resources as identified.     [facility] CM Care Plan Problem One   Flowsheet Row Most Recent Value  Care Plan Problem One  Patient with mental health concerns.  Role  Documenting the Problem One  Clinical Social Worker  Care Plan for Problem One  Active  [facility] Long Term Goal (31-90 days)  Patient will be linked with communuty resources and pursue for assistance with mental health concerns within the next 60 days.   [facility] Long Term Goal Start Date  06/12/16  Interventions for Problem One Long Term Goal  CSW discussed benefit of seeking mental health support.  [facility] CM Short Term Goal #1 (0-30 days)  Patient will receive and report follow up with community based services within the next 30days.  [facility] CM Short Term Goal #1 Start Date  06/12/16  Interventions for Short Term Goal #1  CSW educated patient on grief, loss, depression and overall mental health wellness and resources available.     Eduard Clos, MSW, [place] Worker  [place] [phone]

## 2016-06-19 ENCOUNTER — Other Ambulatory Visit: Payer: Self-pay | Admitting: Physician Assistant

## 2016-06-19 NOTE — Patient Outreach (Signed)
[facility]) Care Management  06/19/2016  [name] [birth year] [id number]   Request received from Eduard Clos, LCSW to mail patient reading program information, SCAT application and information, Meals on Wheels, [place] [facility] information and Food pantry information. Information mailed today, 06/19/16  [name], [place] Management Assistant

## 2016-06-19 NOTE — Progress Notes (Signed)
I was able to speak with [name] and his sister.  He had just come back from [facility] and they told me his Zoloft was increased to 100 mg.  He is aware of stopping ASA and was advised that if he has any falls or trauma to come in given low platelets.  He is awaiting a call from hematology and plans to attend once the appointment is scheduled. [name], MS, PA-C 3:13 PM, 06/19/2016

## 2016-06-20 ENCOUNTER — Encounter: Payer: Self-pay | Admitting: Hematology

## 2016-06-23 ENCOUNTER — Other Ambulatory Visit: Payer: Self-pay | Admitting: Pharmacist

## 2016-06-23 NOTE — Patient Outreach (Signed)
[facility]) Care Management  06/23/2016  [name] Oct 18, 1955 [id number]  61 year old male referred to [facility] for medication management.  Per referral patient is now having uncontrolled seizures and is on Depakote. Patient requests he would like pharmacy review of his medications as seizures have just started recently. Talked with patients sister who states patient often gets confused and she fills patients pill box. Called patient and discussed medications.  He states his younger sister comes weekly to fill pill boxes and he is trying to get 90 day supply to synchronize when his medications are filled.  Plan: Followup home visit tomorrow to assess medication management/adherence   Bennye Alm, PharmD, [place] PGY2 Pharmacy Resident [phone]

## 2016-06-24 ENCOUNTER — Other Ambulatory Visit: Payer: Self-pay | Admitting: Pharmacist

## 2016-06-24 ENCOUNTER — Ambulatory Visit: Payer: Medicare Other | Admitting: Pharmacist

## 2016-06-24 NOTE — Patient Outreach (Addendum)
[place] [facility]) Care Management  [place]   06/25/2016  [name] December 19, 1955 [id number]  Subjective: 61 year old male referred to [place] for medication management.  Per referral patient is now having uncontrolled seizures and is on Depakote. Patient requests he would like pharmacy review of his medications as seizures have just started recently. Talked with patients sister who states patient often gets confused and she fills patients pill box. [facility] pharmacy home visit performed today.    Patient denies seizures.  Reports 4 seizures in past 3-4 months.  Denies seizures since on medications. Does report 2 falls due to the floor in his trailer being uneven. Also reports falls in the past due to dizziness ~2 months ago.  He denies this dizziness since starting Depakote.    He reports he has not had alcohol in 2 years and states his abrupt withdrawal may have caused seizures.    Reports he normally sleeps well with the trazodone and Belsomra.    Patient Denies chest pain.    Reports shortness of breath twice daily.  Reports walking slower than others his same age or when walking on the level to his mailbox.  Reports never doing a pulmonary function test.   Denies ED or hospitalizations for breathing.    Patients sister requests more information about transportation resources to reduce her caregiver burden.   Objective:   Encounter Medications: Outpatient Encounter Prescriptions as of 06/24/2016  Medication Sig  . albuterol (PROAIR HFA) 108 (90 Base) MCG/ACT inhaler Inhale 2 puffs into the lungs every 6 (six) hours as needed for wheezing or shortness of breath.  [name] albuterol (PROVENTIL) (2.5 MG/3ML) 0.083% nebulizer solution Take 3 mLs (2.5 mg total) by nebulization every 6 (six) hours as needed for wheezing or shortness of breath.  [name] atorvastatin (LIPITOR) 10 MG tablet Take 10 mg by mouth daily at 6 PM.   . B Complex-C (SUPER B COMPLEX/VITAMIN C PO) Take 1  tablet by mouth daily.  . busPIRone (BUSPAR) 15 MG tablet Take 15 mg by mouth 3 (three) times daily.  . clonazePAM (KLONOPIN) 0.5 MG tablet Take 0.5 mg by mouth at bedtime as needed for anxiety.  . divalproex (DEPAKOTE) 500 MG DR tablet Take 1 tablet (500 mg total) by mouth 3 (three) times daily.  . hydrOXYzine (ATARAX/VISTARIL) 10 MG tablet Take 10 mg by mouth 3 (three) times daily.   . meloxicam (MOBIC) 15 MG tablet Take 15 mg by mouth daily.  . nitroGLYCERIN (NITROSTAT) 0.4 MG SL tablet Place 1 tablet (0.4 mg total) under the tongue every 5 (five) minutes as needed for chest pain.  [name] QUEtiapine Fumarate (SEROQUEL XR) 150 MG 24 hr tablet Take 1 tablet (150 mg total) by mouth at bedtime.  . sertraline (ZOLOFT) 50 MG tablet Take 100 mg by mouth daily.  . Suvorexant (BELSOMRA) 15 MG TABS Take 15 mg by mouth at bedtime.  [name] tiZANidine (ZANAFLEX) 4 MG tablet Take 4 mg by mouth 3 (three) times daily. 2-3 times daily  . traZODone (DESYREL) 100 MG tablet Take 1 tablet (100 mg total) by mouth at bedtime.   No facility-administered encounter medications on file as of 06/24/2016.     Functional Status: In your present state of health, do you have any difficulty performing the following activities: 06/24/2016 05/15/2016  Hearing? N N  Vision? N N  Difficulty concentrating or making decisions? N Y  Walking or climbing stairs? N Y  Dressing or bathing? N N  Doing errands, shopping? N Y  Conservation officer, nature and eating ? N N  Using the Toilet? N N  In the past six months, have you accidently leaked urine? N N  Do you have problems with loss of bowel control? N N  Managing your Medications? Y Y  Managing your Finances? [name]  Housekeeping or managing your Housekeeping? [name]  Some recent data might be hidden    Fall/Depression Screening: PHQ 2/9 Scores 06/12/2016 06/12/2016 05/02/2016 04/15/2016 02/11/2016 01/16/2016 12/19/2015  PHQ - 2 Score 6 4 6  0 0 4 0  PHQ- 9 Score 22 15 18  - - 13 -  Exception Documentation  Medical reason - - - - - -    Assessment: Drugs sorted by system: Neurologic/Psychologic: buspirone, clonazepam, divalproex, hydroxyzine, quetiapine, tizanidine, trazodone, Belsomra  Cardiovascular: atorvastatin, nitroglycerin  Pulmonary/Allergy: albuterol  Pain: meloxicam  Vitamins/Minerals: Vitamin B Complex   Medications to avoid in the elderly: clonazepam, hydroxyzine, Belsomra due to risk of falls  Drug interactions: Risk of CNS depression and serotonin syndrome with duplicate antianxiety/antidepressants Other issues noted:  Patient currently not on aspirin with history of coronary artery disease due to pancytopenia.   Medication Management/Adherence:  Patients sister fills medication pill box but expresses difficulty filling the boxes several times a month as new medications are refilled.  She is patients primary caregiver and takes him to all of his appointments.  [facility] reports they have trouble refilling patients medications from [place] which they report has been difficult for them to obtain refills.     Plan: Called [facility] Malachi Paradise, NP) to see if patient needed to be seen prior to refills being sent and to notify them that we were going to provide pill packing/medication synchronization services for [name] (left voicemail) Will contact patients pharmacy [facility]) and will provide them with medication synchronization codes to run next week.  Pharmacist states she needs to contact [facility] for refills for a few of his medications.   Will reach out to [facility] LCSW to followup with patients sister [name] about transportation resources Will followup via telephone next week with [facility] to see if refills were approved and to start synchronization process.   Bennye Alm, PharmD, BCPS [facility] PGY2 Pharmacy Resident [phone]  [facility] CM Care Plan Problem One   Flowsheet Row Most Recent Value  Care Plan Problem One  Medication adherence as evidenced by  patient report  Role Documenting the Problem One  Clinical Pharmacist  Care Plan for Problem One  Active  [facility] CM Short Term Goal #1 (0-30 days)  Patient will have >85% adherence with his medications over the next 30 days   [facility] CM Short Term Goal #1 Start Date  06/24/16  Interventions for Short Term Goal #1  Discussed importance of medication adherence.  [facility] pharmacist is setting up medication synchronization to allow sister to fill pill box more easily.

## 2016-06-25 ENCOUNTER — Other Ambulatory Visit: Payer: Self-pay | Admitting: *Deleted
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
## 2016-06-26 ENCOUNTER — Other Ambulatory Visit: Payer: Self-pay | Admitting: *Deleted

## 2016-06-26 NOTE — Patient Outreach (Signed)
[facility]) Care Management  06/26/2016  BENTO GANTERT 1956-04-23 [id number]  CSW spoke with patient who is agreeable to visit 1/15 with CSW and sister to review resources sent and to discuss further needs,etc.   Eduard Clos, MSW, [place] Worker  [place] [phone]

## 2016-06-30 ENCOUNTER — Ambulatory Visit: Payer: Self-pay | Admitting: *Deleted

## 2016-07-01 ENCOUNTER — Encounter: Payer: Self-pay | Admitting: Pharmacist

## 2016-07-02 ENCOUNTER — Ambulatory Visit: Payer: Medicare Other

## 2016-07-02 ENCOUNTER — Other Ambulatory Visit: Payer: Self-pay | Admitting: Pharmacist

## 2016-07-02 ENCOUNTER — Telehealth: Payer: Self-pay | Admitting: Physician Assistant
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
## 2016-07-03 ENCOUNTER — Ambulatory Visit: Payer: Self-pay | Admitting: *Deleted

## 2016-07-03 ENCOUNTER — Other Ambulatory Visit: Payer: Self-pay | Admitting: Pharmacist

## 2016-07-03 ENCOUNTER — Other Ambulatory Visit: Payer: Self-pay | Admitting: *Deleted

## 2016-07-03 MED ORDER — TIZANIDINE HCL 4 MG PO TABS: 4.0000 mg | ORAL_TABLET | Freq: Three times a day (TID) | ORAL | 3 refills | Status: DC

## 2016-07-03 MED ORDER — MELOXICAM 7.5 MG PO TABS: 7.5000 mg | ORAL_TABLET | Freq: Every day | ORAL | 1 refills | Status: DC
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
## 2016-07-03 NOTE — Telephone Encounter (Signed)
Calling in 90 day presecriptions of Meloxicam at reduced does 7.5 mg daily vs 15 mg, along with muscle relaxer per [facility] request given patient is no longer managed by ortho. [name], MS, PA-C 8:47 AM, 07/03/2016
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
## 2016-07-07 ENCOUNTER — Ambulatory Visit (INDEPENDENT_AMBULATORY_CARE_PROVIDER_SITE_OTHER): Payer: Medicare Other | Admitting: Physician Assistant

## 2016-07-07 ENCOUNTER — Encounter: Payer: Self-pay | Admitting: Physician Assistant

## 2016-07-07 VITALS — BP 102/68 | HR 65 | Temp 98.2°F | Resp 16 | Ht 71.0 in | Wt 189.4 lb

## 2016-07-07 DIAGNOSIS — R634 Abnormal weight loss: Secondary | ICD-10-CM

## 2016-07-07 DIAGNOSIS — D696 Thrombocytopenia, unspecified: Secondary | ICD-10-CM | POA: Diagnosis not present

## 2016-07-07 DIAGNOSIS — Z79899 Other long term (current) drug therapy: Secondary | ICD-10-CM | POA: Diagnosis not present

## 2016-07-07 DIAGNOSIS — F331 Major depressive disorder, recurrent, moderate: Secondary | ICD-10-CM

## 2016-07-07 MED ORDER — MIRTAZAPINE 7.5 MG PO TABS: 7.5000 mg | ORAL_TABLET | Freq: Every day | ORAL | 0 refills | Status: DC

## 2016-07-07 MED ORDER — SUVOREXANT 15 MG PO TABS: 15.0000 mg | ORAL_TABLET | Freq: Every day | ORAL | 5 refills | Status: DC
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
## 2016-07-07 NOTE — Patient Instructions (Addendum)
  Take a slow 20 minute walk every day and try to get some sunshine. Come back in one week.  [name], MS, PA-C 3:57 PM, 07/07/2016     IF you received an x-ray today, you will receive an invoice from [facility]. Please contact [facility] at [phone] with questions or concerns regarding your invoice.   IF you received labwork today, you will receive an invoice from [place]. Please contact [facility] at [phone] with questions or concerns regarding your invoice.   Our billing staff will not be able to assist you with questions regarding bills from these companies.  You will be contacted with the lab results as soon as they are available. The fastest way to get your results is to activate your My Chart account. Instructions are located on the last page of this paperwork. If you have not heard from [place] regarding the results in 2 weeks, please contact this office.

## 2016-07-07 NOTE — Progress Notes (Signed)
No problem. His apt is with Dr Irene Limbo on February 6th at 2pm. I can give him a call to remind him unless he is still in the office for today's visit and you are able to relay the message.

## 2016-07-08 ENCOUNTER — Telehealth: Payer: Self-pay

## 2016-07-08 ENCOUNTER — Other Ambulatory Visit: Payer: Self-pay | Admitting: Pharmacist

## 2016-07-08 NOTE — Telephone Encounter (Signed)
Left VM with patient regarding Hematology apt. His apt is Tuesday Feb 6th at 2pm. This apt information and the address of their office is on his After Visit Summary from yesterday's visit with [name] if he still has this print out.

## 2016-07-11 ENCOUNTER — Other Ambulatory Visit: Payer: Self-pay | Admitting: *Deleted

## 2016-07-14 ENCOUNTER — Ambulatory Visit (INDEPENDENT_AMBULATORY_CARE_PROVIDER_SITE_OTHER): Payer: Medicare Other | Admitting: Physician Assistant

## 2016-07-14 ENCOUNTER — Ambulatory Visit: Payer: Self-pay | Admitting: *Deleted

## 2016-07-14 ENCOUNTER — Encounter: Payer: Self-pay | Admitting: Physician Assistant

## 2016-07-14 VITALS — BP 85/58 | HR 58 | Temp 98.0°F | Resp 16 | Ht 71.0 in | Wt 191.0 lb

## 2016-07-14 DIAGNOSIS — E860 Dehydration: Secondary | ICD-10-CM | POA: Diagnosis not present

## 2016-07-14 DIAGNOSIS — F331 Major depressive disorder, recurrent, moderate: Secondary | ICD-10-CM

## 2016-07-14 DIAGNOSIS — R42 Dizziness and giddiness: Secondary | ICD-10-CM

## 2016-07-14 LAB — GLUCOSE, POCT (MANUAL RESULT ENTRY): POC Glucose: 85 mg/dl (ref 70–99)

## 2016-07-14 LAB — POCT URINALYSIS DIP (MANUAL ENTRY)
Blood, UA: NEGATIVE
Glucose, UA: NEGATIVE
Leukocytes, UA: NEGATIVE
Nitrite, UA: NEGATIVE
Protein Ur, POC: NEGATIVE
Spec Grav, UA: >=1.03
Urobilinogen, UA: 0.2
pH, UA: 6

## 2016-07-14 LAB — POCT CBC
Granulocyte percent: 57.5 %G (ref 37–80)
HCT, POC: 44.3 % (ref 43.5–53.7)
Hemoglobin: 15.4 g/dL (ref 14.1–18.1)
Lymph, poc: 1.5 (ref 0.6–3.4)
MCH, POC: 32.2 pg — AB (ref 27–31.2)
MCHC: 34.7 g/dL (ref 31.8–35.4)
MCV: 93.1 fL (ref 80–97)
MID (cbc): 0.4 (ref 0–0.9)
MPV: 7.6 fL (ref 0–99.8)
POC Granulocyte: 2.6 (ref 2–6.9)
POC LYMPH PERCENT: 33.9 %L (ref 10–50)
POC MID %: 8.6 %M (ref 0–12)
Platelet Count, POC: 42 10*3/uL — AB (ref 142–424)
RBC: 4.76 M/uL (ref 4.69–6.13)
RDW, POC: 15.1 %
WBC: 4.5 10*3/uL — AB (ref 4.6–10.2)
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
## 2016-07-14 NOTE — Patient Instructions (Addendum)
  Drink copious fluids.   I prefer you drink some gatoraid or something that has electrolytes.  It is important to avoid things that could cause you to get dehydrated, such as coffee or alcohol. You do not take any medications that would cause you to lose fluid. I have one lab pending and will contact you if there are any problems.  Your labs look great so far.    IF you received an x-ray today, you will receive an invoice from [facility]. Please contact [facility] at [phone] with questions or concerns regarding your invoice.   IF you received labwork today, you will receive an invoice from [facility]. Please contact [facility] at [phone] with questions or concerns regarding your invoice.   Our billing staff will not be able to assist you with questions regarding bills from these companies.  You will be contacted with the lab results as soon as they are available. The fastest way to get your results is to activate your My Chart account. Instructions are located on the last page of this paperwork. If you have not heard from [place] regarding the results in 2 weeks, please contact this office.    \

## 2016-07-15 LAB — CMP14+EGFR
ALT: 25 IU/L (ref 0–44)
AST: 31 IU/L (ref 0–40)
Albumin/Globulin Ratio: 2 (ref 1.2–2.2)
Albumin: 4.5 g/dL (ref 3.6–4.8)
Alkaline Phosphatase: 48 IU/L (ref 39–117)
BUN/Creatinine Ratio: 25 — ABNORMAL HIGH (ref 10–24)
BUN: 28 mg/dL — ABNORMAL HIGH (ref 8–27)
Bilirubin Total: 0.6 mg/dL (ref 0.0–1.2)
CO2: 21 mmol/L (ref 18–29)
Calcium: 9 mg/dL (ref 8.6–10.2)
Chloride: 99 mmol/L (ref 96–106)
Creatinine, Ser: 1.13 mg/dL (ref 0.76–1.27)
GFR calc Af Amer: 80 mL/min/{1.73_m2} (ref >59)
GFR calc non Af Amer: 69 mL/min/{1.73_m2} (ref >59)
Globulin, Total: 2.3 g/dL (ref 1.5–4.5)
Glucose: 83 mg/dL (ref 65–99)
Potassium: 4.4 mmol/L (ref 3.5–5.2)
Sodium: 139 mmol/L (ref 134–144)
Total Protein: 6.8 g/dL (ref 6.0–8.5)

## 2016-07-15 LAB — TSH: TSH: 4.74 u[IU]/mL — ABNORMAL HIGH (ref 0.450–4.500)

## 2016-07-16 NOTE — Patient Outreach (Addendum)
[facility]) Care Management  07/08/16  [name] May 28, 1956 [id number]   61 year old male referred to [place] for medication adherence.  Called [facility] today and left another message stating that [place] was trying to assist with syncing patients medications and he was currently out of clonazepam.  Called patients pharmacy and provided them with medication sync codes.  Will followup in 1 week to assess completion of medication synchronization and to assess patient compliance with medications.    Bennye Alm, PharmD, [place] PGY2 Pharmacy Resident [phone]

## 2016-07-17 ENCOUNTER — Other Ambulatory Visit: Payer: Self-pay | Admitting: *Deleted

## 2016-07-17 ENCOUNTER — Telehealth: Payer: Self-pay

## 2016-07-17 ENCOUNTER — Encounter: Payer: Self-pay | Admitting: *Deleted

## 2016-07-17 ENCOUNTER — Other Ambulatory Visit: Payer: Self-pay | Admitting: Pharmacist

## 2016-07-17 ENCOUNTER — Other Ambulatory Visit: Payer: Self-pay | Admitting: Physician Assistant

## 2016-07-17 NOTE — Telephone Encounter (Signed)
Fyi, please advise.

## 2016-07-17 NOTE — Patient Outreach (Signed)
[facility] ([facility]) Care Management   07/17/2016  [name] 01/04/1956 [id number]  [name] is an 61 y.o. male  Subjective:  Patient discussed his recent visit to PCP regarding dizziness and receiving IV fluids.  Patient reports his dizziness has improved, and he has been drinking some gatorade as recommended.  Objective:  BP 90/60 (BP Location: Left Arm, Patient Position: Standing)   Pulse 60   Resp 18   Wt 195 lb (88.5 kg)   SpO2 97%   BMI 27.20 kg/m   Drinking gatorade during visit.  Review of Systems  Constitutional: Negative.   HENT: Negative.   Eyes: Negative.   Respiratory: Negative.   Cardiovascular: Negative.   Gastrointestinal: Negative.   Genitourinary: Negative.   Musculoskeletal: Positive for back pain.  Skin: Negative.   Neurological: Negative.   Endo/Heme/Allergies: Negative.   Psychiatric/Behavioral: Negative for suicidal ideas.    Physical Exam  Constitutional: He is oriented to person, place, and time. He appears well-developed and well-nourished.  Cardiovascular: Normal rate and normal heart sounds.   Respiratory: Effort normal.  GI: Soft.  Neurological: He is alert and oriented to person, place, and time.  Skin: Skin is warm and dry.  Psychiatric: He has a normal mood and affect. His behavior is normal. Judgment and thought content normal.    Encounter Medications:   Outpatient Encounter Prescriptions as of 07/17/2016  Medication Sig  . albuterol (PROAIR HFA) 108 (90 Base) MCG/ACT inhaler Inhale 2 puffs into the lungs every 6 (six) hours as needed for wheezing or shortness of breath.  . albuterol (PROVENTIL) (2.5 MG/3ML) 0.083% nebulizer solution Take 3 mLs (2.5 mg total) by nebulization every 6 (six) hours as needed for wheezing or shortness of breath.  . atorvastatin (LIPITOR) 10 MG tablet Take 10 mg by mouth daily at 6 PM.   . B Complex-C (SUPER B COMPLEX/VITAMIN C PO) Take 1 tablet by mouth daily.  . busPIRone (BUSPAR) 15  MG tablet Take 15 mg by mouth 3 (three) times daily.  . clonazePAM (KLONOPIN) 0.5 MG tablet Take 0.5 mg by mouth at bedtime as needed for anxiety.  . divalproex (DEPAKOTE) 500 MG DR tablet Take 1 tablet (500 mg total) by mouth 3 (three) times daily.  . meloxicam (MOBIC) 7.5 MG tablet Take 1 tablet (7.5 mg total) by mouth daily.  . mirtazapine (REMERON) 7.5 MG tablet Take 1 tablet (7.5 mg total) by mouth at bedtime.  . nitroGLYCERIN (NITROSTAT) 0.4 MG SL tablet Place 1 tablet (0.4 mg total) under the tongue every 5 (five) minutes as needed for chest pain.  . QUEtiapine Fumarate (SEROQUEL XR) 150 MG 24 hr tablet Take 1 tablet (150 mg total) by mouth at bedtime.  . sertraline (ZOLOFT) 50 MG tablet Take 100 mg by mouth daily.  . Suvorexant (BELSOMRA) 15 MG TABS Take 15 mg by mouth at bedtime.  . tiZANidine (ZANAFLEX) 4 MG tablet Take 1 tablet (4 mg total) by mouth 3 (three) times daily. 2-3 times daily   No facility-administered encounter medications on file as of 07/17/2016.     Functional Status:   In your present state of health, do you have any difficulty performing the following activities: 06/24/2016 05/15/2016  Hearing? N N  Vision? N N  Difficulty concentrating or making decisions? N Y  Walking or climbing stairs? N Y  Dressing or bathing? N N  Doing errands, shopping? N Y  Preparing Food and eating ? N N  Using the Toilet? N   N  In the past six months, have you accidently leaked urine? N N  Do you have problems with loss of bowel control? N N  Managing your Medications? Y Y  Managing your Finances? [name]  Housekeeping or managing your Housekeeping? [name]  Some recent data might be hidden    Fall/Depression Screening:    PHQ 2/9 Scores 07/14/2016 07/07/2016 06/12/2016 06/12/2016 05/02/2016 04/15/2016 02/11/2016  PHQ - 2 Score _0 0 0  PHQ- 9 Score _1 - -  Exception Documentation - - Medical reason - - - -    Assessment:    Dizziness/Hypotension Some improvement in  dizziness , patient has good appetite and reports eating and drinking well, at least one gatorade a day. Patient has monitor and agreeable to use at home .  High Fall risk  Review fall precautions. Floors in patient bedroom area , very weak dipping down,increase risk for fall.    Medications-  Noted trazodone still being placed in  pill organizer at home  not on epic list or recent AVS from PCP visit. Need verification from MD . Patient's sister to fill pill box on tomorrow.   Patient asked about if he could get some help to come to his house to help with household chores that he has difficulty completing, discussed he had this type service about  2 years .    Plan:  Placed call to [name], PCP to notify of blood pressure readings and to verify if   patient to still take trazodone   Placed call to patient's sister Claiborne Billings  to notify of trazodone in organizer and to inform of call to MD to report today's blood pressure readings and noted trazodone in organizer. Claiborne Billings recalls MD stating to take only as needed.   1600 incoming call from Waverly Ferrari stating he does not want patient to take Trazodone, placed call to [place] to inform her.  EMMI education video on fall prevention.  Patient will notify MD for worsening symptoms , recurrent dizziness  Will discuss with Eduard Clos, [facility] social worker regarding available resources for  floor repair and patient request regarding assistance at home.  Will send visit note as quarterly update.  [facility] CM Care Plan Problem One   Flowsheet Row Most Recent Value  Care Plan Problem One  Patient with high risk for fall   Role Documenting the Problem One  Care Management [place] for Problem One  Active  [facility] Long Term Goal (31-90 days)  Patient will report increased of measures to decrease fall risk in the next 60 days  [goal restated ]  [facility] Long Term Goal Start Date  06/25/16 [goal date restart ]  Interventions for Problem One Long  Term Goal  Reinforced to use cane, and change positions closely,stand a few seconds before starting to walk   [facility] CM Short Term Goal #1 (0-30 days)  Patient will report beginning to use his cane in the next 30 days   [facility] CM Short Term Goal #1 Start Date  05/15/16  [facility] CM Short Term Goal #1 Met Date  06/12/16  [facility] CM Short Term Goal #2 (0-30 days)  Patient will report completing home therapy exercises at least 5 days a week in the next 30 days   [facility] CM Short Term Goal #2 Start Date  05/15/16  [facility] CM Short Term Goal #2 Met Date  06/12/16  Interventions for Short Term Goal #  2  Reinforced patient to continue with exercise routine provided by home health therapy encouraged participation   [facility] CM Short Term Goal #3 (0-30 days)  Patient will report drinking one ensure daily between meal as a supplement in the next 30 days    [facility] CM Short Term Goal #3 Start Date  07/17/16 [goal restarted ]  Interventions for Short Tern Goal #3  Encouraged to continue to drink ensure as available and eat balanced meal available   [facility] CM Short Term Goal #4 (0-30 days)  Patient will report no falls in the next 30 days   [facility] CM Short Term Goal #4 Start Date  06/25/16  Interventions for Short Term Goal #4  Viewed EMMI fall prevention video at visit   [facility] CM Short Term Goal #5 (0-30 days)  Patient will report checking blood pressure at least 2 days a week and with symtoms in the next 30 days   [facility] CM Short Term Goal #5 Start Date  07/17/16  Interventions for Short Term Goal #5  Demonstrated how to use monitor he has at home, and he returned , demonstration, discussed symptoms and low bp reading to notify MD      [facility] CM Care Plan Problem Two   Flowsheet Row Most Recent Value  Care Plan Problem Two  Knowledge deficit related to COPD self care manamgment   Role Documenting the Problem Two  Care Management Coordinator  Care Plan for Problem Two  Not Active  Interventions for Problem Two Long Term Goal   Discussed how irritants  such as wood stove heat interfer with breathing   [facility] Long Term Goal (31-90) days  Patient will report increased knowledge of COPD self care manangement in the next 60 days   [facility] Long Term Goal Start Date  05/15/16  [facility] Long Term Goal Met Date  07/17/16  [facility] CM Short Term Goal #1 (0-30 days)  Patient will be able to indentify yellow zone symptoms in the next 30 days   [facility] CM Short Term Goal #1 Start Date  05/15/16  [facility] CM Short Term Goal #1 Met Date   06/12/16  [facility] CM Short Term Goal #2 (0-30 days)  Patient will be able to state action plan for COPD zones in the next 30 days   [facility] CM Short Term Goal #2 Start Date  05/16/16  [facility] CM Short Term Goal #2 Met Date  06/12/16       , RN, PCCN [facility] Care Management [phone]- Mobile [phone]- Toll Free Main Office     

## 2016-07-17 NOTE — Patient Outreach (Signed)
[facility]) Care Management  07/17/2016  [name] Mar 26, 1956 [id number]   CSW contacted patient's sister to reschedule visit to review resources provided to patient. CSW plans home visit on 07/24/16 with patient and his sister, Claiborne Billings.    Eduard Clos, MSW, [place] Worker  [place] [phone]

## 2016-07-17 NOTE — Telephone Encounter (Signed)
[facility] nurse was visiting pt today and was wanting to let [place] know that the blood pressure was 98/60 setting and 90/60 standing and that the dizziness is gone  And the medication is not on his epic list but patient is still taking trazadone and needs to verify if he should still be taking this   Best number kim glover [phone] And please call pt to let know about the medication if he should be taking it

## 2016-07-17 NOTE — Patient Outreach (Signed)
[facility]) Care Management  07/17/2016  SHAUNN MIHOK Nov 03, 1955 [id number]  61 year old male referred to [place] for medication adherence.  Patient states he does not have 90 day supply of clonazepam, meloxicam, quetiapine, sertraline, buspirone, atorvastatin and mirtazapine. Mr Tensley states that [name], [place] did not refill clonazepam and was going to leave the decision to continue up to [facility] as patient has been off of clonazepam for ~30 days.  Patient states his sister is coming tomorrow to fill pill boxes.   Patient reports adherence to his medications. Patient does report dizziness with hypotension. Patient reports he has been drinking gatorade and water.  He states he is going to get groceries tomorrow and will pick up some more gatorade.   Patient states he has followup with Neurology in 2 weeks.   Denies chest pain or seizures.   Talked with [facility] and they state they have had trouble syncing patients medications due to not having regular refills of his medications from [place].    Plan:  Will contact [name], [place] for refills for 90 day supply for clonazepam, meloxicam, quetiapine, sertraline, buspirone, atorvastatin and mirtazapine if he deems appropriate Will take 4 weeks of pill boxes to patients home so sister can fill pill boxes for 4 weeks to reduce caregiver burden.  Will plan [facility] pharmacy home visit for next week to assess medication adherence.   Bennye Alm, PharmD, BCPS [facility] PGY2 Pharmacy Resident [phone]  [facility] CM Care Plan Problem One   Flowsheet Row Most Recent Value  Care Plan Problem One  Medication adherence as evidenced by patient report  Role Documenting the Problem One  Clinical Pharmacist  Care Plan for Problem One  Active  [facility] CM Short Term Goal #1 (0-30 days)  Patient will have >85% adherence with his medications over the next 30 days   [facility] CM Short Term Goal #1 Start Date  06/24/16  Interventions for  Short Term Goal #1  Discussed importance of medication adherence.  [facility] pharmacist is setting up medication synchronization to allow sister to fill pill box more easily.

## 2016-07-17 NOTE — Telephone Encounter (Signed)
We can STOP the trazodone. I have called.  [name], MS, PA-C 3:07 PM, 07/17/2016

## 2016-07-22 ENCOUNTER — Telehealth: Payer: Self-pay

## 2016-07-22 ENCOUNTER — Ambulatory Visit (HOSPITAL_BASED_OUTPATIENT_CLINIC_OR_DEPARTMENT_OTHER): Payer: Medicare Other

## 2016-07-22 ENCOUNTER — Encounter: Payer: Self-pay | Admitting: Hematology

## 2016-07-22 ENCOUNTER — Ambulatory Visit (HOSPITAL_BASED_OUTPATIENT_CLINIC_OR_DEPARTMENT_OTHER): Payer: Medicare Other | Admitting: Hematology

## 2016-07-22 VITALS — BP 107/85 | HR 67 | Temp 98.4°F | Resp 17 | Ht 71.0 in | Wt 191.6 lb

## 2016-07-22 DIAGNOSIS — D696 Thrombocytopenia, unspecified: Secondary | ICD-10-CM | POA: Diagnosis not present

## 2016-07-22 DIAGNOSIS — F319 Bipolar disorder, unspecified: Secondary | ICD-10-CM

## 2016-07-22 DIAGNOSIS — I10 Essential (primary) hypertension: Secondary | ICD-10-CM | POA: Diagnosis not present

## 2016-07-22 DIAGNOSIS — Z720 Tobacco use: Secondary | ICD-10-CM

## 2016-07-22 DIAGNOSIS — K7460 Unspecified cirrhosis of liver: Secondary | ICD-10-CM

## 2016-07-22 DIAGNOSIS — R161 Splenomegaly, not elsewhere classified: Secondary | ICD-10-CM

## 2016-07-22 DIAGNOSIS — Z87898 Personal history of other specified conditions: Secondary | ICD-10-CM

## 2016-07-22 LAB — COMPREHENSIVE METABOLIC PANEL
ALT: 25 U/L (ref 0–55)
AST: 30 U/L (ref 5–34)
Albumin: 3.9 g/dL (ref 3.5–5.0)
Alkaline Phosphatase: 51 U/L (ref 40–150)
Anion Gap: 8 mEq/L (ref 3–11)
BUN: 24.2 mg/dL (ref 7.0–26.0)
CO2: 27 mEq/L (ref 22–29)
Calcium: 9.1 mg/dL (ref 8.4–10.4)
Chloride: 104 mEq/L (ref 98–109)
Creatinine: 1 mg/dL (ref 0.7–1.3)
EGFR: 82 mL/min/{1.73_m2} — ABNORMAL LOW (ref >90)
Glucose: 78 mg/dl (ref 70–140)
Potassium: 4.8 mEq/L (ref 3.5–5.1)
Sodium: 140 mEq/L (ref 136–145)
Total Bilirubin: 0.59 mg/dL (ref 0.20–1.20)
Total Protein: 6.5 g/dL (ref 6.4–8.3)

## 2016-07-22 LAB — CBC & DIFF AND RETIC
BASO%: 0.8 % (ref 0.0–2.0)
Basophils Absolute: 0 10*3/uL (ref 0.0–0.1)
EOS%: 4.4 % (ref 0.0–7.0)
Eosinophils Absolute: 0.2 10*3/uL (ref 0.0–0.5)
HCT: 43.5 % (ref 38.4–49.9)
HGB: 14.6 g/dL (ref 13.0–17.1)
Immature Retic Fract: 8.5 % (ref 3.00–10.60)
LYMPH%: 31 % (ref 14.0–49.0)
MCH: 32 pg (ref 27.2–33.4)
MCHC: 33.6 g/dL (ref 32.0–36.0)
MCV: 95.4 fL (ref 79.3–98.0)
MONO#: 0.6 10*3/uL (ref 0.1–0.9)
MONO%: 11.9 % (ref 0.0–14.0)
NEUT#: 2.7 10*3/uL (ref 1.5–6.5)
NEUT%: 51.9 % (ref 39.0–75.0)
Platelets: 58 10*3/uL — ABNORMAL LOW (ref 140–400)
RBC: 4.56 10*6/uL (ref 4.20–5.82)
RDW: 15.3 % — ABNORMAL HIGH (ref 11.0–14.6)
Retic %: 1.41 % (ref 0.80–1.80)
Retic Ct Abs: 64.3 10*3/uL (ref 34.80–93.90)
WBC: 5.2 10*3/uL (ref 4.0–10.3)
lymph#: 1.6 10*3/uL (ref 0.9–3.3)

## 2016-07-22 LAB — CHCC SMEAR

## 2016-07-22 LAB — LACTATE DEHYDROGENASE: LDH: 116 U/L — ABNORMAL LOW (ref 125–245)
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
## 2016-07-22 NOTE — Telephone Encounter (Signed)
Spoke with patient to remind of hematology apt today. Pt states that he will be at that apt & wants [name]/us to call him if he needs to come into our office again soon.

## 2016-07-24 ENCOUNTER — Other Ambulatory Visit: Payer: Self-pay | Admitting: *Deleted

## 2016-07-24 ENCOUNTER — Telehealth: Payer: Self-pay | Admitting: *Deleted

## 2016-07-24 DIAGNOSIS — B182 Chronic viral hepatitis C: Secondary | ICD-10-CM

## 2016-07-24 NOTE — Telephone Encounter (Signed)
Called patient's sister and left a voice mail to let her know I need to schedule an ultrasound for him at [facility] for sometime this month.

## 2016-07-24 NOTE — Telephone Encounter (Signed)
Lonie Peak, patient's sister, called back to let Kennyth Lose know she would call [facility] and schedule her brother's ultrasound for this month.  RN gave her the phone number. Landis Gandy, RN

## 2016-07-24 NOTE — Addendum Note (Signed)
Addended by: Landis Gandy on: 07/24/2016 10:06 AM   Modules accepted: Orders

## 2016-07-25 ENCOUNTER — Other Ambulatory Visit: Payer: Self-pay | Admitting: Pharmacist

## 2016-07-25 ENCOUNTER — Ambulatory Visit: Payer: Self-pay | Admitting: Pharmacist

## 2016-07-25 NOTE — Patient Outreach (Signed)
[facility]) Care Management  07/25/2016  [name] 11-27-1955 [id number]  Patient has requested to reschedule home visit for next week. Patient reports he has been taking all of his medications and his sister recently assisted in filling his pill boxes.   Plan:  Will followup medication adherence with home visit next week.  Bennye Alm, PharmD, [place] PGY2 Pharmacy Resident [phone]

## 2016-07-26 ENCOUNTER — Telehealth: Payer: Self-pay | Admitting: Hematology

## 2016-07-26 NOTE — Telephone Encounter (Signed)
Appointments scheduled per 2/6 LOS. Patient notified. °

## 2016-07-29 ENCOUNTER — Encounter: Payer: Self-pay | Admitting: Pharmacist

## 2016-07-29 ENCOUNTER — Other Ambulatory Visit: Payer: Self-pay | Admitting: Pharmacist

## 2016-07-29 NOTE — Patient Outreach (Addendum)
[facility] ([facility]) Care Management  [facility]   07/29/2016  [name] 09/07/1955 [id number]  Subjective: 61 year old male referred to [facility] pharmacy for medication adherence. Today during pharmacy home visit patient reports adherence to his regimen and reports not missing any of his medications.    Denies signs/symptoms of seizures. Denies shortness of breath.  Denies chest pain.  Does nitroglycerin on hand if needed.   Home blood pressure readings:  2/3 98/72 2/4 104/65 2/5 127/93 2/6  109/68 2/7 114/72 2/8 121/74 2/9 119/66 2/10 119/82 2/11 103/69  Reports he has been drinking gatorade (2 per day) and water.  He reports he cannot afford ensure.    Objective:  Vitals:   07/29/16 1029  BP: 103/71  Pulse: 63   Encounter Medications: Outpatient Encounter Prescriptions as of 07/29/2016  Medication Sig  . divalproex (DEPAKOTE) 500 MG DR tablet Take 1 tablet (500 mg total) by mouth 3 (three) times daily.  . meloxicam (MOBIC) 7.5 MG tablet Take 1 tablet (7.5 mg total) by mouth daily.  . albuterol (PROAIR HFA) 108 (90 Base) MCG/ACT inhaler Inhale 2 puffs into the lungs every 6 (six) hours as needed for wheezing or shortness of breath.  . albuterol (PROVENTIL) (2.5 MG/3ML) 0.083% nebulizer solution Take 3 mLs (2.5 mg total) by nebulization every 6 (six) hours as needed for wheezing or shortness of breath.  . atorvastatin (LIPITOR) 10 MG tablet Take 10 mg by mouth daily at 6 PM.   . B Complex-C (SUPER B COMPLEX/VITAMIN C PO) Take 1 tablet by mouth daily.  . busPIRone (BUSPAR) 15 MG tablet Take 15 mg by mouth 3 (three) times daily.  . clonazePAM (KLONOPIN) 0.5 MG tablet Take 0.5 mg by mouth at bedtime as needed for anxiety.  . mirtazapine (REMERON) 7.5 MG tablet Take 1 tablet (7.5 mg total) by mouth at bedtime.  . nitroGLYCERIN (NITROSTAT) 0.4 MG SL tablet Place 1 tablet (0.4 mg total) under the tongue every 5 (five) minutes as needed for chest pain.  .  QUEtiapine Fumarate (SEROQUEL XR) 150 MG 24 hr tablet Take 1 tablet (150 mg total) by mouth at bedtime.  . sertraline (ZOLOFT) 50 MG tablet Take 100 mg by mouth daily.  . Suvorexant (BELSOMRA) 15 MG TABS Take 15 mg by mouth at bedtime.  . tiZANidine (ZANAFLEX) 4 MG tablet Take 1 tablet (4 mg total) by mouth 3 (three) times daily. 2-3 times daily   No facility-administered encounter medications on file as of 07/29/2016.     Functional Status: In your present state of health, do you have any difficulty performing the following activities: 06/24/2016 05/15/2016  Hearing? N N  Vision? N N  Difficulty concentrating or making decisions? N Y  Walking or climbing stairs? N Y  Dressing or bathing? N N  Doing errands, shopping? N Y  Preparing Food and eating ? N N  Using the Toilet? N N  In the past six months, have you accidently leaked urine? N N  Do you have problems with loss of bowel control? N N  Managing your Medications? Y Y  Managing your Finances? Y Y  Housekeeping or managing your Housekeeping? Y Y  Some recent data might be hidden    Fall/Depression Screening: PHQ 2/9 Scores 07/14/2016 07/07/2016 06/12/2016 06/12/2016 05/02/2016 04/15/2016 02/11/2016  PHQ - 2 Score 6 6 6 4 6 0 0  PHQ- 9 Score 18 23 22 15 18 - -  Exception Documentation - - Medical reason - - - -      Assessment: Medication Adherence: Patient reports adherence to his medications over the past month.  Patients prescriptions with exception of mirtazapine and Belsomra are now all 90 day supply to reduce care burden on his sister which fills his pill boxes.   Plan: Filled 6 days of medications in new pill boxes.  Have removed trazodone from current pill boxes and instructed patient [name], NP has taken him off this medication.  Provided with 3 weekly pill boxes for his sister to fill Instructed patient that he needs to call pharmacy in a few days for refills of mirtazepine and belsomra.   Recommended trying G2  gatorade instead of regular Gatorade to reduce glucose intake.  [facility] pharmacy will sign off as patient medication adherence goals have been met.  Patient will still be followed by [place] work and nursing.  Please reconsult if needed.    Bennye Alm, PharmD, BCPS [facility] PGY2 Pharmacy Resident [phone]  [facility] CM Care Plan Problem One   Flowsheet Row Most Recent Value  Care Plan Problem One  Medication adherence as evidenced by patient report  Role Documenting the Problem One  Clinical Pharmacist  Care Plan for Problem One  Active  [facility] CM Short Term Goal #1 (0-30 days)  Patient will have >85% adherence with his medications over the next 30 days   [facility] CM Short Term Goal #1 Start Date  06/24/16  [facility] CM Short Term Goal #1 Met Date  07/29/16  Interventions for Short Term Goal #1  Discussed importance of medication adherence.  [facility] pharmacist is setting up medication synchronization to allow sister to fill pill box more easily.

## 2016-07-31 ENCOUNTER — Ambulatory Visit (INDEPENDENT_AMBULATORY_CARE_PROVIDER_SITE_OTHER): Payer: Medicare Other | Admitting: Neurology

## 2016-07-31 DIAGNOSIS — R569 Unspecified convulsions: Secondary | ICD-10-CM

## 2016-07-31 NOTE — Procedures (Signed)
      History: [name] is a 61 year old patient with a history of seizures. The patient also has a history of bipolar disorder and chronic hepatitis C infection and a history of alcoholism.  This is a routine EEG. No skull defects are noted. Medications include albuterol inhaler, aspirin, Lipitor, clonazepam, Depakote, Mobic, Seroquel, Zoloft, Belsomra, Zanaflex, and trazodone.  EEG classification: Dysrhythmia grade 1 generalized  Description of the recording: The background rhythms of this recording consists of a well modulated medium amplitude 7 Hz background slowing that is reactive to eye opening and closure. As the record progresses, the patient appears to remain in the waking state throughout the recording. Photic stimulation is performed, this results in a bilateral and symmetric photic driving response. Hyperventilation is then performed which results in a minimal buildup of the background rhythm activities without significant slowing seen. At no time during the recording does there appear to be evidence of spike or spike wave discharges or evidence of focal slowing. EKG monitor shows no evidence of cardiac rhythm abnormalities with a heart rate of 60.  Impression: This is a minimally abnormal EEG recording secondary to diffuse background theta frequency slowing. This is a nonspecific recording can be seen with any process that results in a mild toxic or metabolic encephalopathy or any dementing type illness. No epileptiform discharges were seen.

## 2016-08-01 ENCOUNTER — Telehealth: Payer: Self-pay | Admitting: *Deleted

## 2016-08-01 ENCOUNTER — Ambulatory Visit
Admission: RE | Admit: 2016-08-01 | Discharge: 2016-08-01 | Disposition: A | Discharge status: Home / Self Care | Payer: Medicare Other | Source: Ambulatory Visit | Attending: Internal Medicine | Admitting: Internal Medicine

## 2016-08-01 DIAGNOSIS — B182 Chronic viral hepatitis C: Secondary | ICD-10-CM

## 2016-08-01 DIAGNOSIS — K8020 Calculus of gallbladder without cholecystitis without obstruction: Secondary | ICD-10-CM | POA: Diagnosis not present

## 2016-08-01 NOTE — Telephone Encounter (Signed)
Yes it can, can also been seen in drowsiness as well. thanks

## 2016-08-01 NOTE — Telephone Encounter (Signed)
Per Dr Jaynee Eagles, spoke with patient and informed him that the same results can be seen after avoiding alcohol for a couple years. Advised it can also be due to drowsiness during the exam. He verbalized understanding, appreciation for call back.

## 2016-08-01 NOTE — Telephone Encounter (Signed)
Per Dr Jaynee Eagles, spoke with patient and informed him that his EEG showed no seizure-like discharges. However the eeg was slowed possibly due to his daily alcohol use.  Patient stated he has not had any alcohol in the past two years. He inquired if, after avoiding alcohol for two years, would EEG still read the same result. Advised him this RN cannot answer his question, but will route to Dr Jaynee Eagles for her reply. Advised he may not get call back today; office closes at noon. However he will get call back next week.  He verbalized understanding, appreciation.

## 2016-08-02 ENCOUNTER — Other Ambulatory Visit: Payer: Self-pay | Admitting: Physician Assistant

## 2016-08-02 DIAGNOSIS — F331 Major depressive disorder, recurrent, moderate: Secondary | ICD-10-CM

## 2016-08-04 ENCOUNTER — Ambulatory Visit (INDEPENDENT_AMBULATORY_CARE_PROVIDER_SITE_OTHER): Payer: Medicare Other | Admitting: Physician Assistant

## 2016-08-04 ENCOUNTER — Encounter: Payer: Self-pay | Admitting: Physician Assistant

## 2016-08-04 VITALS — BP 87/61 | HR 74 | Temp 97.9°F | Resp 18 | Ht 71.0 in | Wt 201.8 lb

## 2016-08-04 DIAGNOSIS — Z09 Encounter for follow-up examination after completed treatment for conditions other than malignant neoplasm: Secondary | ICD-10-CM | POA: Diagnosis not present

## 2016-08-04 DIAGNOSIS — F3170 Bipolar disorder, currently in remission, most recent episode unspecified: Secondary | ICD-10-CM | POA: Diagnosis not present

## 2016-08-04 DIAGNOSIS — D61818 Other pancytopenia: Secondary | ICD-10-CM | POA: Diagnosis not present

## 2016-08-04 MED ORDER — ALBUTEROL SULFATE HFA 108 (90 BASE) MCG/ACT IN AERS: 2.0000 | INHALATION_SPRAY | Freq: Four times a day (QID) | RESPIRATORY_TRACT | 6 refills | Status: DC | PRN
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
## 2016-08-04 NOTE — Patient Instructions (Signed)
     IF you received an x-ray today, you will receive an invoice from [facility]. Please contact [facility] at [phone] with questions or concerns regarding your invoice.   IF you received labwork today, you will receive an invoice from [facility]. Please contact [facility] at [phone] with questions or concerns regarding your invoice.   Our billing staff will not be able to assist you with questions regarding bills from these companies.  You will be contacted with the lab results as soon as they are available. The fastest way to get your results is to activate your My Chart account. Instructions are located on the last page of this paperwork. If you have not heard from us regarding the results in 2 weeks, please contact this office.     

## 2016-08-04 NOTE — Telephone Encounter (Signed)
07/07/16 last refill Ov today

## 2016-08-06 ENCOUNTER — Other Ambulatory Visit: Payer: Self-pay | Admitting: *Deleted

## 2016-08-06 NOTE — Patient Outreach (Signed)
[place] [facility]) Care Management  08/06/2016  [name] 03-Oct-1955 [id number]   CSW spoke to patient and have provided him with community resources that may be of help to him including transportation, food assist, home repairs, etc.  He reports f/u with his PCP is 3/26 and that a "Nurse came by today".  Patient denies any concerns or problems at this time for CSW.  CSW plans f/u call in 2-3 weeks and probable case closure at that time.     [facility] CM Care Plan Problem One   Flowsheet Row Most Recent Value  Care Plan Problem One  (P) Patient with mental health concerns.  Role Documenting the Problem One  (P) Clinical Social Worker  Care Plan for Problem One  (P) Active  THN Long Term Goal (31-90 days)  (P) Patient will be linked with communuty resources and pursue for assistance with mental health concerns within the next 60 days.   THN Long Term Goal Start Date  (P) 06/12/16  Interventions for Problem One Long Term Goal  (P) CSW discussed benefit of seeking mental health support.  THN CM Short Term Goal #1 (0-30 days)  (P) Patient will receive and report follow up with community based services within the next 30days.  THN CM Short Term Goal #1 Start Date  (P) 06/12/16  Interventions for Short Term Goal #1  (P) CSW educated patient on grief, loss, depression and overall mental health wellness and resources available.      Eduard Clos, MSW, [place] Worker  [place] [phone]

## 2016-08-07 ENCOUNTER — Ambulatory Visit: Payer: Medicare Other | Admitting: *Deleted

## 2016-08-07 ENCOUNTER — Other Ambulatory Visit: Payer: Self-pay | Admitting: *Deleted

## 2016-08-07 ENCOUNTER — Other Ambulatory Visit: Payer: Self-pay | Admitting: Physician Assistant

## 2016-08-07 DIAGNOSIS — F331 Major depressive disorder, recurrent, moderate: Secondary | ICD-10-CM

## 2016-08-07 NOTE — Patient Outreach (Signed)
[place] [facility]) Care Management  08/07/2016  [name] 03-04-1956 [id number]   Telephone follow up  Spoke with patient reports he is doing pretty good. Patient discussed he is tolerating activity in home, denies having recent fall and no dizziness on today and denies seizure episodes. Patient reports he continues to drink gatorade at least one daily , appetite is good, denies weight loss. Patient continues to monitor his blood pressures at home and reported recent readings of  98/72 109/78.  Patient discussed he had recent visit from [place] for house call she comes about once a year. Patient discussed he is taking his medication as prescribed, as his sister fills his pill organizer, patient noted he needed renewal on refill on Mirtazapine, he anticipated to be able to pick it up from pharmacy as he discussed it at recent PCP visit, he  has enough for the next 5 days.  Plan Place call to [facility] , spoke with pharmacist that will send renewal notice to PCP, will update patient. Will plan home visit in the next month and Discuss  case closure if no needs or issues to be addressed  Patient in agreement. Margie Billet CM Care Plan Problem One   Flowsheet Row Most Recent Value  Care Plan Problem One  Patient with high risk for fall   Role Documenting the Problem One  Care Management [place] for Problem One  Active  THN Long Term Goal (31-90 days)  Patient will report increased of measures to decrease fall risk in the next 60 days  [goal restated ]  THN Long Term Goal Start Date  06/25/16 [goal date restart ]  Interventions for Problem One Long Term Goal  Reinforced to use cane, and change positions closely,stand a few seconds before starting to walk   THN CM Short Term Goal #1 (0-30 days)  Patient will report beginning to use his cane in the next 30 days   THN CM Short Term Goal #1 Start Date  05/15/16  [facility] CM Short Term Goal #1 Met Date  06/12/16   THN CM Short Term Goal #2 (0-30 days)  Patient will report completing home therapy exercises at least 5 days a week in the next 30 days   THN CM Short Term Goal #2 Start Date  05/15/16  [facility] CM Short Term Goal #2 Met Date  06/12/16  Interventions for Short Term Goal #2  Reinforced patient to continue with exercise routine provided by home health therapy encouraged participation   THN CM Short Term Goal #3 (0-30 days)  Patient will report drinking one ensure or gatorade  daily between meal as a supplement in the next 30 days   [restated goal ]  [facility] CM Short Term Goal #3 Start Date  07/17/16 Barrie Folk restarted ]  [facility] CM Short Term Goal #3 Met Date  08/07/16  THN CM Short Term Goal #4 (0-30 days)  Patient will report no falls in the next 30 days   THN CM Short Term Goal #4 Start Date  06/25/16  [facility] CM Short Term Goal #4 Met Date  08/07/16  THN CM Short Term Goal #5 (0-30 days)  Patient will report checking blood pressure at least 2 days a week and with symtoms in the next 30 days   THN CM Short Term Goal #5 Start Date  07/17/16  [facility] CM Short Term Goal #5 Met Date  08/07/16    [facility] CM Care Plan Problem  Two   Flowsheet Row Most Recent Value  Care Plan Problem Two  Knowledge deficit related to COPD self care manamgment   Role Documenting the Problem Two  Care Management Coordinator  Care Plan for Problem Two  Not Active  Interventions for Problem Two Long Term Goal   Discussed how irritants such as wood stove heat interfer with breathing   THN Long Term Goal (31-90) days  Patient will report increased knowledge of COPD self care manangement in the next 60 days   THN Long Term Goal Start Date  05/15/16  THN Long Term Goal Met Date  07/17/16  THN CM Short Term Goal #1 (0-30 days)  Patient will be able to indentify yellow zone symptoms in the next 30 days   THN CM Short Term Goal #1 Start Date  05/15/16  [facility] CM Short Term Goal #1 Met Date   06/12/16  THN CM Short Term Goal #2 (0-30 days)  Patient will  be able to state action plan for COPD zones in the next 30 days   THN CM Short Term Goal #2 Start Date  05/16/16  [facility] CM Short Term Goal #2 Met Date  06/12/16       Joylene Draft, RN, [place] Care Management [phone]- Mobile [phone]- [place]

## 2016-08-21 ENCOUNTER — Ambulatory Visit: Payer: Self-pay | Admitting: *Deleted

## 2016-08-21 ENCOUNTER — Other Ambulatory Visit: Payer: Self-pay | Admitting: *Deleted

## 2016-08-21 NOTE — Patient Outreach (Signed)
[place] [facility]) Care Management  08/21/2016  [name] 11/04/1955 [id number]   Telephone call to patient prior to arrival for scheduled home visit. Patient requested that I not visit on today, requested that I reschedule for a few weeks. Patient discussed feeling tired on today, still resting in bed. He discussed he still has not been able to get faucet at kitchen sink  fixed in home, states his neighbor is going to help him.   Patient reports he is still eating delivered meals on wheels and able to fix himself simple meals or sandwiches. He still drinks gatorade, currently out but hopes to go to grocery store on today. Patient reports his recent weight was 203 and he has a new set of scales.   Patient denies having a recent fall, denies dizziness, he monitors he blood pressures a few times a week and keeps a record per his report blood pressure running in the 90 to 100 range. Patient denies shortness of breath, cough or need to use inhaler or nebulizer.  [name] states his sister is still helping to fill his pill boxes, he currently has 3 pill boxes and all of his medications.  Patient discussed his upcoming visit to PCP and probation officer on 3/26 , Probation officer visit at 2 pm and MD visit at 3 pm. Patient discussed his sister has a part time job now but hopes she can still take him, he plans to call her today to make sure.  Patient discussed concern regarding transportation to future visits due to his sister's work schedule .   Discussed community care goals progress and possible case closure if no new need identified by next visit, which patient requested home visit.    Plan Review progression toward patient centered goals and update. Will schedule next home visit in this  month .  Will discuss with Eduard Clos, LCSW patient concern regarding transportation .  Will review patient benefits with [facility], community plan for transportation and discuss with  patient at next visit.      Joylene Draft, RN, [place] Management [phone]- Mobile [phone]- Toll Free Main Office

## 2016-08-25 ENCOUNTER — Other Ambulatory Visit: Payer: Self-pay | Admitting: *Deleted

## 2016-08-27 ENCOUNTER — Ambulatory Visit: Payer: Self-pay | Admitting: *Deleted

## 2016-08-27 ENCOUNTER — Other Ambulatory Visit: Payer: Self-pay | Admitting: *Deleted

## 2016-08-29 ENCOUNTER — Other Ambulatory Visit: Payer: Self-pay | Admitting: *Deleted

## 2016-08-29 NOTE — Patient Outreach (Signed)
[place] [facility]) Care Management  [place]  08/29/2016   [name] 05-10-1956 [id number]  Subjective: "I am supposed to see someone at [facility]".   Objective: CSW to assist patient with commuity based resources to aide in her well-being, quality of life and overall safety/needs.    Encounter Medications:  Outpatient Encounter Prescriptions as of 08/29/2016  Medication Sig  . albuterol (PROAIR HFA) 108 (90 Base) MCG/ACT inhaler Inhale 2 puffs into the lungs every 6 (six) hours as needed for wheezing or shortness of breath.  [name] atorvastatin (LIPITOR) 10 MG tablet Take 10 mg by mouth daily at 6 PM.   . B Complex-C (SUPER B COMPLEX/VITAMIN C PO) Take 1 tablet by mouth daily.  . busPIRone (BUSPAR) 15 MG tablet Take 15 mg by mouth 3 (three) times daily.  . divalproex (DEPAKOTE) 500 MG DR tablet Take 1 tablet (500 mg total) by mouth 3 (three) times daily.  . meloxicam (MOBIC) 7.5 MG tablet Take 1 tablet (7.5 mg total) by mouth daily.  . mirtazapine (REMERON) 7.5 MG tablet TAKE 1 TABLET BY MOUTH AT BEDTIME.  . nitroGLYCERIN (NITROSTAT) 0.4 MG SL tablet Place 1 tablet (0.4 mg total) under the tongue every 5 (five) minutes as needed for chest pain.  [name] QUEtiapine Fumarate (SEROQUEL XR) 150 MG 24 hr tablet Take 1 tablet (150 mg total) by mouth at bedtime.  . sertraline (ZOLOFT) 100 MG tablet Take 100 mg by mouth daily.  . Suvorexant (BELSOMRA) 15 MG TABS Take 15 mg by mouth at bedtime.  [name] tiZANidine (ZANAFLEX) 4 MG tablet Take 1 tablet (4 mg total) by mouth 3 (three) times daily. 2-3 times daily   No facility-administered encounter medications on file as of 08/29/2016.     Functional Status:  In your present state of health, do you have any difficulty performing the following activities: 08/21/2016 06/24/2016  Hearing? N N  Vision? N N  Difficulty concentrating or making decisions? Y N  Walking or climbing stairs? Y N  Dressing or bathing? N N  Doing errands, shopping?  Y N  Preparing Food and eating ? N N  Using the Toilet? N N  In the past six months, have you accidently leaked urine? N N  Do you have problems with loss of bowel control? N N  Managing your Medications? Y Y  Managing your Finances? [name]  Housekeeping or managing your Housekeeping? [name]  Some recent data might be hidden    Fall/Depression Screening: PHQ 2/9 Scores 08/04/2016 07/14/2016 07/07/2016 06/12/2016 06/12/2016 05/02/2016 04/15/2016  PHQ - 2 Score 0 '6 6 6 4 6 ' 0  PHQ- 9 Score - '18 23 22 15 18 ' -  Exception Documentation - - - Medical reason - - -    Assessment:  CSW spoke with patient who is still feeling depressed; he is open to going back to [place] and agrees to CSW looking into this for him- as well, he states; "I am supposed to see Dr Josph Macho there about my medicines". He thinks he has a ride to his upcoming PCP appointment but would like to get linked with Medicaid Transportation if he is eligible. "[employer] wont come out this far".  Plan: CSW will assist with application for Medicaid transportation as well as f/u with [facility] for outpatient counseling as well as regarding the RX for depression. CSW will plan f/u call to patient next week with update.    [facility] CM Care Plan Problem One  Most Recent Value  Care Plan Problem One  Patient with mental health concerns.  Role Documenting the Problem One  Clinical Social Worker  Care Plan for Problem One  Active  THN Long Term Goal (31-90 days)  Patient will be linked with communuty resources and pursue for assistance with mental health concerns within the next 60 days.   THN Long Term Goal Start Date  06/12/16  Interventions for Problem One Long Term Goal  CSW discussed benefit of seeking mental health support.  THN CM Short Term Goal #1 (0-30 days)  Patient will receive and report follow up with community based services within the next 30days.  THN CM Short Term Goal #1 Start Date  06/12/16  [facility] CM Short Term Goal #1 Met Date  08/06/16   Interventions for Short Term Goal #1  CSW educated patient on grief, loss, depression and overall mental health wellness and resources available.     Eduard Clos, MSW, [place] Worker  [place] [phone]

## 2016-09-04 ENCOUNTER — Other Ambulatory Visit: Payer: Self-pay | Admitting: *Deleted

## 2016-09-05 NOTE — Patient Outreach (Signed)
[place] [facility]) Care Management  09/05/2016  [name] 1956/04/18 [id number]

## 2016-09-06 NOTE — Patient Outreach (Signed)
[place] [facility]) Care Management  09/06/2016  [name] [birth year] [id number]   CSW spoke with patient who reports he has rides to his appointments and has no needs at this time. CSW discussed resources previously provided for transportation and home repairs and plans f/u call in next 2 weeks.      Eduard Clos, MSW, [place] Worker  [place] [phone]

## 2016-09-08 ENCOUNTER — Ambulatory Visit (INDEPENDENT_AMBULATORY_CARE_PROVIDER_SITE_OTHER): Payer: Medicare Other | Admitting: Physician Assistant

## 2016-09-08 ENCOUNTER — Encounter: Payer: Self-pay | Admitting: Physician Assistant

## 2016-09-08 VITALS — BP 110/59 | HR 80 | Temp 97.7°F | Resp 16 | Ht 71.0 in | Wt 213.0 lb

## 2016-09-08 DIAGNOSIS — Z91199 Patient's noncompliance with other medical treatment and regimen due to unspecified reason: Secondary | ICD-10-CM

## 2016-09-08 DIAGNOSIS — Z5329 Procedure and treatment not carried out because of patient's decision for other reasons: Secondary | ICD-10-CM

## 2016-09-08 NOTE — Patient Instructions (Signed)
     IF you received an x-ray today, you will receive an invoice from [facility]. Please contact  [facility] at [phone] with questions or concerns regarding your invoice.   IF you received labwork today, you will receive an invoice from [facility]. Please contact [facility] at [phone] with questions or concerns regarding your invoice.   Our billing staff will not be able to assist you with questions regarding bills from these companies.  You will be contacted with the lab results as soon as they are available. The fastest way to get your results is to activate your My Chart account. Instructions are located on the last page of this paperwork. If you have not heard from us regarding the results in 2 weeks, please contact this office.     

## 2016-09-09 ENCOUNTER — Other Ambulatory Visit: Payer: Self-pay | Admitting: *Deleted

## 2016-09-09 ENCOUNTER — Encounter: Payer: Self-pay | Admitting: *Deleted

## 2016-09-09 NOTE — Patient Outreach (Addendum)
[facility]) Care Management   09/09/2016  [name] 01-20-1956 [id number]  [name] is an 61 y.o. male  Subjective:  Doing pretty good,  Patient discussed his visit to [facility] on today to see psychiatrist.  Patient discussed he attend PCP appointment yesterday but had to leave before seeing doctor  because it overlapped with another appointment that he had,but states  rescheduled visit.     Objective:  BP 110/68 (BP Location: Right Arm, Patient Position: Sitting, Cuff Size: Normal)   Pulse 68   Resp 18   SpO2 96%   Review of Systems  Constitutional: Negative.   HENT: Negative.   Eyes: Negative.   Respiratory: Negative.   Cardiovascular: Negative.   Genitourinary: Negative.   Musculoskeletal: Negative.   Skin: Negative.   Neurological: Negative.   Endo/Heme/Allergies: Negative.   Psychiatric/Behavioral: Positive for depression.    Physical Exam  Constitutional: He is oriented to person, place, and time. He appears well-developed and well-nourished.  Cardiovascular: Normal rate and normal heart sounds.   Respiratory: Effort normal.  GI: Soft.  Neurological: He is alert and oriented to person, place, and time.  Skin: Skin is warm and dry.  Psychiatric: He has a normal mood and affect. His behavior is normal. Judgment and thought content normal.    Encounter Medications:   Outpatient Encounter Prescriptions as of 09/09/2016  Medication Sig  . albuterol (PROAIR HFA) 108 (90 Base) MCG/ACT inhaler Inhale 2 puffs into the lungs every 6 (six) hours as needed for wheezing or shortness of breath.  [name] atorvastatin (LIPITOR) 10 MG tablet Take 10 mg by mouth daily at 6 PM.   . B Complex-C (SUPER B COMPLEX/VITAMIN C PO) Take 1 tablet by mouth daily.  . busPIRone (BUSPAR) 15 MG tablet Take 15 mg by mouth 3 (three) times daily.  . divalproex (DEPAKOTE) 500 MG DR tablet Take 1 tablet (500 mg total) by mouth 3 (three) times daily.  . meloxicam (MOBIC) 7.5  MG tablet Take 1 tablet (7.5 mg total) by mouth daily.  . mirtazapine (REMERON) 7.5 MG tablet TAKE 1 TABLET BY MOUTH AT BEDTIME.  . nitroGLYCERIN (NITROSTAT) 0.4 MG SL tablet Place 1 tablet (0.4 mg total) under the tongue every 5 (five) minutes as needed for chest pain.  [name] QUEtiapine Fumarate (SEROQUEL XR) 150 MG 24 hr tablet Take 1 tablet (150 mg total) by mouth at bedtime.  . sertraline (ZOLOFT) 100 MG tablet Take 100 mg by mouth daily.  . Suvorexant (BELSOMRA) 15 MG TABS Take 15 mg by mouth at bedtime.  [name] tiZANidine (ZANAFLEX) 4 MG tablet Take 1 tablet (4 mg total) by mouth 3 (three) times daily. 2-3 times daily   No facility-administered encounter medications on file as of 09/09/2016.     Functional Status:   In your present state of health, do you have any difficulty performing the following activities: 08/21/2016 06/24/2016  Hearing? N N  Vision? N N  Difficulty concentrating or making decisions? Y N  Walking or climbing stairs? Y N  Dressing or bathing? N N  Doing errands, shopping? Y N  Preparing Food and eating ? N N  Using the Toilet? N N  In the past six months, have you accidently leaked urine? N N  Do you have problems with loss of bowel control? N N  Managing your Medications? Y Y  Managing your Finances? [name]  Housekeeping or managing your Housekeeping? Y Y  Some recent data might be hidden  Fall/Depression Screening:    PHQ 2/9 Scores 08/04/2016 07/14/2016 07/07/2016 06/12/2016 06/12/2016 05/02/2016 04/15/2016  PHQ - 2 Score 0 _0 0  PHQ- 9 Score - _1 -  Exception Documentation - - - Medical reason - - -    Assessment:    Fall Risk-  No recent falls  Seizure History- No recent seizure per patient report.  COPD - green zone, has not required use of nebulizer or rescue inhaler has both on hand if needed. Medications - patient's sister continues help with filling  pill organizers along with patient, assisted patient at visit with filling pill  organizer,he completed well, except he did not have enough depakote to complete filling box as well as bellsomra . Patient was able to call pharmacy for refill on both medications and note placed on pill organizer listing medications needed to complete box. Patient had an additional pill organizer already completely filled ready for use.   Transportation - Discussed with patient his insurance [employer] community plan  benefit of transportation to medical appointments offered by his insurance contact number [phone]. Assisted patient with calling to arrange transportation to his next MD visit, he then changed his mind stating current transportation is working out and he will keep it like it is for now with his sister taking him for MD appointments.  I have written contact number for transportation  service in patient's calendar, and a reminder that the services requires 3 day notice prior to scheduled Medical appointment .  Patient has met community care nursing goals, no new needs identified at today's visit. Patient has new blood pressure machine through his over the counter insurance benefits, he is able to use machine to monitor his blood pressures. Patient appetite is improved, drinking boost almost daily along with simple meals he prepares and receives meals on wheels.  Patient has support of his sister for transportation to MD appointments and assistance with filling pill organizer. Patient able to notify his pharmacy with needed refills. Patient has broken sink faucet states his brother in law will repair this weekend.   Will close case to nursing and provided patient with [facility] contact number for future needs, Eduard Clos , LCSW still involved in care.   Plan:  Will send MD discipline closure letter, and in basket Eduard Clos, LCSW  to notify of nursing program closure.    Joylene Draft, RN, [place] [facility] [phone]- Mobile [phone]- Toll Free Main Office

## 2016-09-09 NOTE — Progress Notes (Signed)
Patient did not present for appointment. [name], MS, PA-C 10:58 AM, 09/09/2016

## 2016-09-10 ENCOUNTER — Other Ambulatory Visit: Payer: Self-pay | Admitting: *Deleted

## 2016-09-10 NOTE — Patient Outreach (Signed)
[facility]) Care Management  [facility] Social Work  09/10/2016  [name] 1956/02/14 [id number]  Subjective:  "I got my rides worked out".  Objective: CSW to assist patient with commuity based resources to aide in her well-being, quality of life and overall safety/needs.    Encounter Medications:  Outpatient Encounter Prescriptions as of 09/10/2016  Medication Sig  . albuterol (PROAIR HFA) 108 (90 Base) MCG/ACT inhaler Inhale 2 puffs into the lungs every 6 (six) hours as needed for wheezing or shortness of breath.  [name] atorvastatin (LIPITOR) 10 MG tablet Take 10 mg by mouth daily at 6 PM.   . B Complex-C (SUPER B COMPLEX/VITAMIN C PO) Take 1 tablet by mouth daily.  . busPIRone (BUSPAR) 15 MG tablet Take 15 mg by mouth 3 (three) times daily.  . divalproex (DEPAKOTE) 500 MG DR tablet Take 1 tablet (500 mg total) by mouth 3 (three) times daily.  . meloxicam (MOBIC) 7.5 MG tablet Take 1 tablet (7.5 mg total) by mouth daily.  . mirtazapine (REMERON) 7.5 MG tablet TAKE 1 TABLET BY MOUTH AT BEDTIME.  . nitroGLYCERIN (NITROSTAT) 0.4 MG SL tablet Place 1 tablet (0.4 mg total) under the tongue every 5 (five) minutes as needed for chest pain.  [name] QUEtiapine Fumarate (SEROQUEL XR) 150 MG 24 hr tablet Take 1 tablet (150 mg total) by mouth at bedtime.  . sertraline (ZOLOFT) 100 MG tablet Take 100 mg by mouth daily.  . Suvorexant (BELSOMRA) 15 MG TABS Take 15 mg by mouth at bedtime.  [name] tiZANidine (ZANAFLEX) 4 MG tablet Take 1 tablet (4 mg total) by mouth 3 (three) times daily. 2-3 times daily   No facility-administered encounter medications on file as of 09/10/2016.     Functional Status:  In your present state of health, do you have any difficulty performing the following activities: 08/21/2016 06/24/2016  Hearing? N N  Vision? N N  Difficulty concentrating or making decisions? Y N  Walking or climbing stairs? Y N  Dressing or bathing? N N  Doing errands, shopping? Y N   Preparing Food and eating ? N N  Using the Toilet? N N  In the past six months, have you accidently leaked urine? N N  Do you have problems with loss of bowel control? N N  Managing your Medications? Y Y  Managing your Finances? [name]  Housekeeping or managing your Housekeeping? [name]  Some recent data might be hidden    Fall/Depression Screening:  PHQ 2/9 Scores 08/04/2016 07/14/2016 07/07/2016 06/12/2016 06/12/2016 05/02/2016 04/15/2016  PHQ - 2 Score 0 '6 6 6 4 6 ' 0  PHQ- 9 Score - '18 23 22 15 18 ' -  Exception Documentation - - - Medical reason - - -    Assessment: CSW spoke with patient by phone who reports he has been able to get his own transportation to/from appointments. Darren Martins, RN, [facility] CM, provided patient with [facility] support for rides and he acknowledges having during our conversation.  CSW also discussed the community resources mailed to him as well as the links made by CSW. He has not heard from the [facility] in regards to his floor repair- CSW will follow up on this and adivse patient.   Patient reports no concerns at this time; he is still uncertain about receiving mental health counseling and is encouraged by CSW to arrange if he desires.    Plan: CSW will pursue other community resources to aide in his needs and  follow up in the next 2 weeks.     THN CM Care Plan Problem One     Most Recent Value  Care Plan Problem One  (P) Patient with mental health concerns.  Role Documenting the Problem One  (P) Clinical Social Worker  Care Plan for Problem One  (P) Active  THN Long Term Goal (31-90 days)  (P) Patient will be linked with communuty resources and pursue for assistance with mental health concerns within the next 60 days.   THN Long Term Goal Start Date  (P) 06/12/16  Interventions for Problem One Long Term Goal  (P) CSW discussed benefit of seeking mental health support.  THN CM Short Term Goal #1 (0-30 days)  (P) Patient will receive and report follow up with  community based services within the next 30days.  THN CM Short Term Goal #1 Start Date  (P) 06/12/16  THN CM Short Term Goal #1 Met Date  (P) 08/06/16  Interventions for Short Term Goal #1  (P) CSW educated patient on grief, loss, depression and overall mental health wellness and resources available.        [name], MSW, [place] Worker  [place] [phone]

## 2016-09-24 ENCOUNTER — Other Ambulatory Visit: Payer: Self-pay | Admitting: *Deleted

## 2016-09-25 NOTE — Patient Outreach (Signed)
[facility]) Care Management  09/25/2016  KYL GIVLER 05-07-1956 [id number]   Patient reports no concerns or barriers at this time- CSW did advise him of the referral that was made to the [place] program to seek assistance with getting his floor repaired.  He is hoping they can assist. He has been able to get rides to all his appointments at this time denies any needs.  CSW will plan f/u call in 2-3 weeks for follow up re: floor repair.   Eduard Clos, MSW, [place] Worker  [place] [phone]

## 2016-10-02 DIAGNOSIS — Z79899 Other long term (current) drug therapy: Secondary | ICD-10-CM | POA: Diagnosis not present

## 2016-10-10 ENCOUNTER — Other Ambulatory Visit: Payer: Self-pay | Admitting: *Deleted

## 2016-10-10 NOTE — Patient Outreach (Signed)
[facility]) Care Management  10/10/2016  REDMOND WHITTLEY 1956/05/21 [id number]   CSW spoke with patient about transportation and he reports his sisters have been able to provide rides as needed.  He has not heard from the home repair non-profit group; CSW will reach out to them for update.  CSW will plan f/u and possible case closure 1-2 weeks.    Eduard Clos, MSW, [place] Worker  [place] [phone]

## 2016-10-20 ENCOUNTER — Telehealth: Payer: Self-pay | Admitting: Family Medicine

## 2016-10-20 ENCOUNTER — Ambulatory Visit: Payer: Medicare Other | Admitting: Physician Assistant

## 2016-10-20 ENCOUNTER — Other Ambulatory Visit: Payer: Self-pay | Admitting: *Deleted

## 2016-10-20 ENCOUNTER — Ambulatory Visit (INDEPENDENT_AMBULATORY_CARE_PROVIDER_SITE_OTHER): Payer: Medicare Other | Admitting: Physician Assistant

## 2016-10-20 ENCOUNTER — Encounter: Payer: Self-pay | Admitting: Physician Assistant

## 2016-10-20 DIAGNOSIS — F331 Major depressive disorder, recurrent, moderate: Secondary | ICD-10-CM | POA: Diagnosis not present

## 2016-10-20 MED ORDER — MIRTAZAPINE 15 MG PO TABS: 15.0000 mg | ORAL_TABLET | Freq: Every day | ORAL | 1 refills | Status: DC
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
## 2016-10-20 NOTE — Telephone Encounter (Signed)
Legrand Como Pt sister calling to ask more questions about medical records to probation officer please call pt sister back

## 2016-10-20 NOTE — Patient Instructions (Signed)
     IF you received an x-ray today, you will receive an invoice from [facility]. Please contact [facility] at [phone] with questions or concerns regarding your invoice.   IF you received labwork today, you will receive an invoice from [facility]. Please contact [facility] at [phone] with questions or concerns regarding your invoice.   Our billing staff will not be able to assist you with questions regarding bills from these companies.  You will be contacted with the lab results as soon as they are available. The fastest way to get your results is to activate your My Chart account. Instructions are located on the last page of this paperwork. If you have not heard from us regarding the results in 2 weeks, please contact this office.     

## 2016-10-20 NOTE — Patient Outreach (Signed)
[facility]) Care Management  10/20/2016  [name] [birth year] [id number]   CSW spoke with patient today who reports he  Is going to Doctor today. "My sister is taking me". CSW discussed resources provided to patient, assessed for further needs and plans to close CSW referral as goals have been met.  Patient agreeable to this plan.  CSW will advise PCP and [facility] team as well.     THN CM Care Plan Problem One     Most Recent Value  Care Plan Problem One  (P) Patient with mental health concerns.  Role Documenting the Problem One  (P) Clinical Social Worker  Care Plan for Problem One  (P) Active  THN Long Term Goal (31-90 days)  (P) Patient will be linked with communuty resources and pursue for assistance with mental health concerns within the next 60 days.   THN Long Term Goal Start Date  (P) 06/12/16  THN Long Term Goal Met Date  (P) 09/10/16  Interventions for Problem One Long Term Goal  (P) CSW discussed benefit of seeking mental health support.  THN CM Short Term Goal #1 (0-30 days)  (P) Patient will receive and report follow up with community based services within the next 30days.  THN CM Short Term Goal #1 Start Date  (P) 06/12/16  THN CM Short Term Goal #1 Met Date  (P) 08/06/16  Interventions for Short Term Goal #1  (P) CSW educated patient on grief, loss, depression and overall mental health wellness and resources available.         Eduard Clos, MSW, [employer] Worker  [place] [phone]

## 2016-10-21 ENCOUNTER — Other Ambulatory Visit: Payer: Medicare Other

## 2016-10-21 ENCOUNTER — Ambulatory Visit: Payer: Medicare Other | Admitting: Hematology

## 2016-10-22 ENCOUNTER — Encounter: Payer: Self-pay | Admitting: Hematology

## 2016-10-22 ENCOUNTER — Telehealth: Payer: Self-pay | Admitting: Hematology

## 2016-10-22 ENCOUNTER — Other Ambulatory Visit (HOSPITAL_BASED_OUTPATIENT_CLINIC_OR_DEPARTMENT_OTHER): Payer: Medicare Other

## 2016-10-22 ENCOUNTER — Ambulatory Visit (HOSPITAL_BASED_OUTPATIENT_CLINIC_OR_DEPARTMENT_OTHER): Payer: Medicare Other | Admitting: Hematology

## 2016-10-22 VITALS — BP 90/57 | HR 52 | Temp 97.7°F | Resp 18 | Ht 71.0 in | Wt 203.4 lb

## 2016-10-22 DIAGNOSIS — Z720 Tobacco use: Secondary | ICD-10-CM

## 2016-10-22 DIAGNOSIS — D696 Thrombocytopenia, unspecified: Secondary | ICD-10-CM

## 2016-10-22 DIAGNOSIS — R161 Splenomegaly, not elsewhere classified: Secondary | ICD-10-CM | POA: Diagnosis not present

## 2016-10-22 DIAGNOSIS — I959 Hypotension, unspecified: Secondary | ICD-10-CM | POA: Diagnosis not present

## 2016-10-22 DIAGNOSIS — K7460 Unspecified cirrhosis of liver: Secondary | ICD-10-CM

## 2016-10-22 LAB — CBC & DIFF AND RETIC
BASO%: 0.5 % (ref 0.0–2.0)
Basophils Absolute: 0 10*3/uL (ref 0.0–0.1)
EOS%: 4 % (ref 0.0–7.0)
Eosinophils Absolute: 0.2 10*3/uL (ref 0.0–0.5)
HCT: 42.8 % (ref 38.4–49.9)
HGB: 14.7 g/dL (ref 13.0–17.1)
Immature Retic Fract: 4.8 % (ref 3.00–10.60)
LYMPH%: 36.4 % (ref 14.0–49.0)
MCH: 33.1 pg (ref 27.2–33.4)
MCHC: 34.3 g/dL (ref 32.0–36.0)
MCV: 96.4 fL (ref 79.3–98.0)
MONO#: 0.5 10*3/uL (ref 0.1–0.9)
MONO%: 11.5 % (ref 0.0–14.0)
NEUT#: 1.9 10*3/uL (ref 1.5–6.5)
NEUT%: 47.6 % (ref 39.0–75.0)
Platelets: 56 10*3/uL — ABNORMAL LOW (ref 140–400)
RBC: 4.44 10*6/uL (ref 4.20–5.82)
RDW: 15 % — ABNORMAL HIGH (ref 11.0–14.6)
Retic %: 2.21 % — ABNORMAL HIGH (ref 0.80–1.80)
Retic Ct Abs: 98.12 10*3/uL — ABNORMAL HIGH (ref 34.80–93.90)
WBC: 4 10*3/uL (ref 4.0–10.3)
lymph#: 1.5 10*3/uL (ref 0.9–3.3)

## 2016-10-22 LAB — COMPREHENSIVE METABOLIC PANEL
ALT: 24 U/L (ref 0–55)
AST: 25 U/L (ref 5–34)
Albumin: 3.9 g/dL (ref 3.5–5.0)
Alkaline Phosphatase: 49 U/L (ref 40–150)
Anion Gap: 8 mEq/L (ref 3–11)
BUN: 11.6 mg/dL (ref 7.0–26.0)
CO2: 28 mEq/L (ref 22–29)
Calcium: 8.9 mg/dL (ref 8.4–10.4)
Chloride: 109 mEq/L (ref 98–109)
Creatinine: 1 mg/dL (ref 0.7–1.3)
EGFR: 81 mL/min/{1.73_m2} — ABNORMAL LOW (ref >90)
Glucose: 122 mg/dl (ref 70–140)
Potassium: 4.2 mEq/L (ref 3.5–5.1)
Sodium: 145 mEq/L (ref 136–145)
Total Bilirubin: 0.48 mg/dL (ref 0.20–1.20)
Total Protein: 6.4 g/dL (ref 6.4–8.3)

## 2016-10-22 NOTE — Patient Instructions (Signed)
Thank you for choosing [facility] to provide your oncology and hematology care.  To afford each patient quality time with our providers, please arrive 30 minutes before your scheduled appointment time.  If you arrive late for your appointment, you may be asked to reschedule.  We strive to give you quality time with our providers, and arriving late affects you and other patients whose appointments are after yours.  If you are a no show for multiple scheduled visits, you may be dismissed from the clinic at the providers discretion.   Again, thank you for choosing [facility], our hope is that these requests will decrease the amount of time that you wait before being seen by our physicians.  ______________________________________________________________________ Should you have questions after your visit to the [facility], please contact our office at [phone] between the hours of 8:30 and 4:30 p.m.    Voicemails left after 4:30p.m will not be returned until the following business day.   For prescription refill requests, please have your pharmacy contact us directly.  Please also try to allow 48 hours for prescription requests.   Please contact the scheduling department for questions regarding scheduling.  For scheduling of procedures such as PET scans, CT scans, MRI, Ultrasound, etc please contact central scheduling at [phone].   Resources For Cancer Patients and Caregivers:  American Cancer Society:  800-227-2345  Can help patients locate various types of support and financial assistance Cancer Care: 1-800-813-HOPE (4673) Provides financial assistance, online support groups, medication/co-pay assistance.   [place] DSS:  336-641-3447 Where to apply for food stamps, Medicaid, and utility assistance Medicare Rights Center: 800-333-4114 Helps people with Medicare understand their rights and benefits, navigate the Medicare system, and secure the  quality healthcare they deserve SCAT: 336-333-6589 [place] [employer]'s shared-ride transportation service for eligible riders who have a disability that prevents them from riding the fixed route bus.   For additional information on assistance programs please contact our social worker:   Grier Hock/Abigail Elmore:  [phone] 
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
## 2016-10-22 NOTE — Telephone Encounter (Signed)
Gave patient AVS and calender per 5/9 los.  

## 2016-11-10 ENCOUNTER — Other Ambulatory Visit: Payer: Self-pay | Admitting: Urgent Care

## 2016-11-10 DIAGNOSIS — F331 Major depressive disorder, recurrent, moderate: Secondary | ICD-10-CM

## 2016-11-11 NOTE — Telephone Encounter (Signed)
10/20/16 last ov

## 2016-11-11 NOTE — Telephone Encounter (Signed)
He should have 15 mg tabs in a six month supply.   I would advise that he call the pharmacy.  If they don't have it I'm happy to resend. Thank you Santiago Glad. [name], MS, PA-C 6:11 PM, 11/11/2016

## 2016-11-13 ENCOUNTER — Telehealth: Payer: Self-pay | Admitting: Physician Assistant

## 2016-11-13 DIAGNOSIS — F331 Major depressive disorder, recurrent, moderate: Secondary | ICD-10-CM

## 2016-11-13 MED ORDER — MIRTAZAPINE 15 MG PO TABS: 15.0000 mg | ORAL_TABLET | Freq: Every day | ORAL | 1 refills | Status: DC

## 2016-11-13 NOTE — Telephone Encounter (Signed)
Script changed to [facility] per electronic request Left message to call clinic if any problems

## 2016-11-13 NOTE — Telephone Encounter (Signed)
PT sister is calling to check on the refill of Mirtazapine and was told that it was sent to [facility] but they do not have any record of the refill being sent and the patient is out of meds and needs this called into [facility] [phone]  Best number [phone]  Sister is Lonie Peak

## 2016-11-14 ENCOUNTER — Telehealth: Payer: Self-pay

## 2016-11-14 NOTE — Telephone Encounter (Signed)
[facility] faxed a Rx refill Auth over for clarification on pt Mirtazapine. Pharmacist stated the pt says the dose was increased to 15mg . Please verfy and refill if appropiate

## 2016-11-14 NOTE — Telephone Encounter (Signed)
Spoke with patient's sister, med sent to [facility]. Advised if any problems to call the office.

## 2016-11-18 ENCOUNTER — Ambulatory Visit: Payer: Medicare Other | Admitting: Adult Health

## 2016-11-18 ENCOUNTER — Encounter: Payer: Self-pay | Admitting: Adult Health

## 2016-11-18 ENCOUNTER — Ambulatory Visit (INDEPENDENT_AMBULATORY_CARE_PROVIDER_SITE_OTHER): Payer: Medicare Other | Admitting: Adult Health

## 2016-11-18 VITALS — BP 116/69 | HR 65 | Ht 71.0 in | Wt 197.8 lb

## 2016-11-18 DIAGNOSIS — Z5181 Encounter for therapeutic drug level monitoring: Secondary | ICD-10-CM

## 2016-11-18 DIAGNOSIS — R569 Unspecified convulsions: Secondary | ICD-10-CM
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
## 2016-11-18 NOTE — Patient Instructions (Signed)
Continue Depakote Blood work today Do not operate a Teacher, music, operate heavy machinery, preform activities at heights or participate in water activities until seizure free for 6 months  Do NOT skip medication doses If your symptoms worsen or you develop new symptoms please let us know.

## 2016-11-20 ENCOUNTER — Ambulatory Visit: Payer: Medicare Other | Admitting: Emergency Medicine

## 2016-11-20 LAB — CBC WITH DIFFERENTIAL/PLATELET
Basophils Absolute: 0 10*3/uL (ref 0.0–0.2)
Basos: 0 %
EOS (ABSOLUTE): 0.1 10*3/uL (ref 0.0–0.4)
Eos: 3 %
Hematocrit: 44.3 % (ref 37.5–51.0)
Hemoglobin: 14.9 g/dL (ref 13.0–17.7)
Immature Grans (Abs): 0 10*3/uL (ref 0.0–0.1)
Immature Granulocytes: 0 %
Lymphocytes Absolute: 1.4 10*3/uL (ref 0.7–3.1)
Lymphs: 31 %
MCH: 32.4 pg (ref 26.6–33.0)
MCHC: 33.6 g/dL (ref 31.5–35.7)
MCV: 96 fL (ref 79–97)
Monocytes Absolute: 0.7 10*3/uL (ref 0.1–0.9)
Monocytes: 15 %
Neutrophils Absolute: 2.2 10*3/uL (ref 1.4–7.0)
Neutrophils: 51 %
Platelets: 53 10*3/uL — CL (ref 150–379)
RBC: 4.6 x10E6/uL (ref 4.14–5.80)
RDW: 15.6 % — ABNORMAL HIGH (ref 12.3–15.4)
WBC: 4.4 10*3/uL (ref 3.4–10.8)

## 2016-11-20 LAB — COMPREHENSIVE METABOLIC PANEL
ALT: 23 IU/L (ref 0–44)
AST: 33 IU/L (ref 0–40)
Albumin/Globulin Ratio: 2 (ref 1.2–2.2)
Albumin: 4.3 g/dL (ref 3.6–4.8)
Alkaline Phosphatase: 43 IU/L (ref 39–117)
BUN/Creatinine Ratio: 26 — ABNORMAL HIGH (ref 10–24)
BUN: 20 mg/dL (ref 8–27)
Bilirubin Total: 0.4 mg/dL (ref 0.0–1.2)
CO2: 23 mmol/L (ref 18–29)
Calcium: 9.2 mg/dL (ref 8.6–10.2)
Chloride: 108 mmol/L — ABNORMAL HIGH (ref 96–106)
Creatinine, Ser: 0.76 mg/dL (ref 0.76–1.27)
GFR calc Af Amer: 113 mL/min/{1.73_m2} (ref >59)
GFR calc non Af Amer: 98 mL/min/{1.73_m2} (ref >59)
Globulin, Total: 2.1 g/dL (ref 1.5–4.5)
Glucose: 46 mg/dL — ABNORMAL LOW (ref 65–99)
Potassium: 4.1 mmol/L (ref 3.5–5.2)
Sodium: 145 mmol/L — ABNORMAL HIGH (ref 134–144)
Total Protein: 6.4 g/dL (ref 6.0–8.5)

## 2016-11-20 LAB — VALPROIC ACID LEVEL: Valproic Acid Lvl: 85 ug/mL (ref 50–100)

## 2016-11-24 ENCOUNTER — Ambulatory Visit: Payer: Medicare Other | Admitting: Physician Assistant

## 2016-12-01 ENCOUNTER — Ambulatory Visit: Payer: Medicare Other | Admitting: Physician Assistant

## 2016-12-12 DIAGNOSIS — R42 Dizziness and giddiness: Secondary | ICD-10-CM | POA: Diagnosis not present

## 2016-12-12 DIAGNOSIS — Z5321 Procedure and treatment not carried out due to patient leaving prior to being seen by health care provider: Secondary | ICD-10-CM | POA: Diagnosis not present

## 2016-12-12 DIAGNOSIS — R0602 Shortness of breath: Secondary | ICD-10-CM | POA: Diagnosis not present

## 2016-12-15 ENCOUNTER — Encounter: Payer: Self-pay | Admitting: Physician Assistant

## 2016-12-15 ENCOUNTER — Telehealth: Payer: Self-pay | Admitting: Physician Assistant

## 2016-12-15 ENCOUNTER — Ambulatory Visit: Payer: Medicare Other | Admitting: Physician Assistant

## 2016-12-15 VITALS — BP 101/67 | HR 69 | Temp 97.5°F | Resp 16 | Ht 69.0 in | Wt 203.6 lb

## 2016-12-15 DIAGNOSIS — Z125 Encounter for screening for malignant neoplasm of prostate: Secondary | ICD-10-CM

## 2016-12-15 DIAGNOSIS — Z13220 Encounter for screening for lipoid disorders: Secondary | ICD-10-CM

## 2016-12-15 DIAGNOSIS — Z0000 Encounter for general adult medical examination without abnormal findings: Secondary | ICD-10-CM

## 2016-12-15 MED ORDER — LACTULOSE 10 G PO PACK: 10.0000 g | PACK | Freq: Three times a day (TID) | ORAL | 1 refills | Status: DC

## 2016-12-15 NOTE — Patient Instructions (Signed)
     IF you received an x-ray today, you will receive an invoice from [facility]. Please contact [facility] at [phone] with questions or concerns regarding your invoice.   IF you received labwork today, you will receive an invoice from [facility]. Please contact [facility] at [phone] with questions or concerns regarding your invoice.   Our billing staff will not be able to assist you with questions regarding bills from these companies.  You will be contacted with the lab results as soon as they are available. The fastest way to get your results is to activate your My Chart account. Instructions are located on the last page of this paperwork. If you have not heard from us regarding the results in 2 weeks, please contact this office.     
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
## 2016-12-16 LAB — LIPID PANEL
Chol/HDL Ratio: 4.2 ratio (ref 0.0–5.0)
Cholesterol, Total: 127 mg/dL (ref 100–199)
HDL: 30 mg/dL — ABNORMAL LOW (ref >39)
LDL Calculated: 70 mg/dL (ref 0–99)
Triglycerides: 137 mg/dL (ref 0–149)
VLDL Cholesterol Cal: 27 mg/dL (ref 5–40)

## 2016-12-16 LAB — PSA: Prostate Specific Ag, Serum: 1.9 ng/mL (ref 0.0–4.0)

## 2016-12-19 NOTE — Telephone Encounter (Signed)
Opened in error. [name], MS, PA-C 1:49 PM, 12/19/2016

## 2016-12-27 NOTE — Progress Notes (Signed)
Personally  participated in, made any corrections needed, and agree with history, physical, neuro exam,assessment and plan as stated.     Antonia Ahern, MD [facility]     

## 2017-01-14 ENCOUNTER — Other Ambulatory Visit: Payer: Self-pay | Admitting: Physician Assistant

## 2017-01-19 ENCOUNTER — Other Ambulatory Visit: Payer: Self-pay | Admitting: Physician Assistant

## 2017-01-19 NOTE — Telephone Encounter (Signed)
Please advise 

## 2017-01-21 ENCOUNTER — Telehealth: Payer: Self-pay | Admitting: Physician Assistant

## 2017-01-21 NOTE — Telephone Encounter (Signed)
[facility] is calling about getting his belsomra filled they stated that we said we have not prescribed this medicaiton before but it was last prescribed by [place] on 07/08/16  Best number [phone]

## 2017-01-22 NOTE — Telephone Encounter (Signed)
It appears in chart that Buspar was discontinued. See note below. Please advise

## 2017-01-23 NOTE — Telephone Encounter (Signed)
Not a high priorty call.  I stopped his buspar. [name], MS, PA-C 6:50 PM, 01/23/2017

## 2017-01-26 ENCOUNTER — Ambulatory Visit: Payer: Medicare Other | Admitting: Physician Assistant

## 2017-01-28 ENCOUNTER — Ambulatory Visit: Payer: Medicare Other | Admitting: Physician Assistant

## 2017-01-30 ENCOUNTER — Ambulatory Visit (INDEPENDENT_AMBULATORY_CARE_PROVIDER_SITE_OTHER): Payer: Medicare Other | Admitting: Physician Assistant

## 2017-01-30 ENCOUNTER — Encounter: Payer: Self-pay | Admitting: Physician Assistant

## 2017-01-30 VITALS — BP 101/68 | HR 68 | Temp 97.6°F | Resp 16 | Ht 69.0 in | Wt 208.0 lb

## 2017-01-30 DIAGNOSIS — F418 Other specified anxiety disorders: Secondary | ICD-10-CM

## 2017-01-30 DIAGNOSIS — G894 Chronic pain syndrome: Secondary | ICD-10-CM | POA: Diagnosis not present

## 2017-01-30 DIAGNOSIS — F5105 Insomnia due to other mental disorder: Secondary | ICD-10-CM

## 2017-01-30 DIAGNOSIS — G252 Other specified forms of tremor: Secondary | ICD-10-CM

## 2017-01-30 MED ORDER — MELOXICAM 7.5 MG PO TABS: 7.5000 mg | ORAL_TABLET | Freq: Every day | ORAL | 3 refills | Status: DC

## 2017-01-30 MED ORDER — SUVOREXANT 15 MG PO TABS: 15.0000 mg | ORAL_TABLET | Freq: Every day | ORAL | 3 refills | Status: DC
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
## 2017-01-30 NOTE — Patient Instructions (Signed)
     IF you received an x-ray today, you will receive an invoice from [facility]. Please contact [facility] at [phone] with questions or concerns regarding your invoice.   IF you received labwork today, you will receive an invoice from [facility]. Please contact [facility] at [phone] with questions or concerns regarding your invoice.   Our billing staff will not be able to assist you with questions regarding bills from these companies.  You will be contacted with the lab results as soon as they are available. The fastest way to get your results is to activate your My Chart account. Instructions are located on the last page of this paperwork. If you have not heard from us regarding the results in 2 weeks, please contact this office.     

## 2017-02-04 NOTE — Progress Notes (Signed)
We will call the patient and get him in for a sooner appointment. Thanks! Ward Givens

## 2017-02-12 ENCOUNTER — Telehealth: Payer: Self-pay | Admitting: Physician Assistant

## 2017-02-12 NOTE — Telephone Encounter (Signed)
Neuro will be calling to get him in and to make a decision on multiple anti epileptic medications. It probably would not hurt for him to call them as well as they may have tried to call.  Please call Mr. [name] back and advise he call. His provider there is aware of the problem.  [name], MS, PA-C 3:14 PM, 02/12/2017

## 2017-02-12 NOTE — Telephone Encounter (Signed)
Pt left vm for referrals stating that Legrand Como was going to call his neurologist about getting an appt. I did not see a referral for this. I called the pt back and he said he did need the referral as well but that Legrand Como was going to call [facility]. Please advise. Pt callback number is [phone]. Pt said for the past few weeks he has fallen 3 times. He said he thinks he just trips over his feet but wanted to let the doctor know since they normally ask if he has fallen in the past 6 months. Thanks.
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
## 2017-02-12 NOTE — Telephone Encounter (Signed)
[name]- please call this patient see what is going on. He may need a sooner appointment with neurologist.

## 2017-02-17 ENCOUNTER — Telehealth: Payer: Self-pay | Admitting: *Deleted

## 2017-02-17 NOTE — Telephone Encounter (Signed)
-----   Message from Ward Givens, NP sent at 02/04/2017  9:35 AM EDT -----   ----- Message ----- From: Hillis Range Sent: 01/30/2017  10:42 AM To: Ward Givens, NP  Patient was started on Lamictal via [place] for hx of BPAD recently.  I am not sure if they are aware of his Depakote or not as I can not seem to get any correspondence from them.  He is compliant with therapy.  I think it is probably best he see you back, but I am happy to make medication changes if you would prefer that.  Additionally, patient complains of worsening resting tremor and I would appreciate your thoughts there as well. Please advise.

## 2017-02-17 NOTE — Telephone Encounter (Signed)
Called patient and advised him per Edman Circle NP the Depakote he takes could cause tremors. Advised him ,however she would like him to continue taking as prescribed if he can tolerate tremors because it is controlling his seizures. Patient has FU tomorrow; inquired if he should keep that FU. This RN stated she could discuss with NP in the morning and call him back. Patient stated it is difficult for him to get transportation. He has transportation arranged tomorrow ands would prefer to come in. This RN advised that at that time NP may determine his 6 month FU in Dec may be able to be moved out. Patient stated he would like to come tomorrow, verbalized understanding, appreciation.

## 2017-02-17 NOTE — Telephone Encounter (Signed)
Sooner appt needed pt out of belsomra

## 2017-02-18 ENCOUNTER — Ambulatory Visit (INDEPENDENT_AMBULATORY_CARE_PROVIDER_SITE_OTHER): Payer: Medicare Other | Admitting: Adult Health

## 2017-02-18 ENCOUNTER — Encounter: Payer: Self-pay | Admitting: Adult Health

## 2017-02-18 VITALS — BP 101/62 | HR 62 | Wt 206.6 lb

## 2017-02-18 DIAGNOSIS — G40909 Epilepsy, unspecified, not intractable, without status epilepticus: Secondary | ICD-10-CM

## 2017-02-18 DIAGNOSIS — Z5181 Encounter for therapeutic drug level monitoring: Secondary | ICD-10-CM

## 2017-02-18 DIAGNOSIS — R251 Tremor, unspecified: Secondary | ICD-10-CM

## 2017-02-18 NOTE — Progress Notes (Signed)
Thank you for your care. [name], MS, PA-C 1:40 PM, 02/18/2017

## 2017-02-18 NOTE — Patient Instructions (Signed)
Your Plan:  Continue Depakote Blood work today If tremor worsens please let us know If your symptoms worsen or you develop new symptoms please let us know.    Thank you for coming to see [place] at [facility]. I hope we have been able to provide you high quality care today.  You may receive a patient satisfaction survey over the next few weeks. We would appreciate your feedback and comments so that we may continue to improve ourselves and the health of our patients.
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
## 2017-02-19 LAB — COMPREHENSIVE METABOLIC PANEL
ALT: 19 IU/L (ref 0–44)
AST: 25 IU/L (ref 0–40)
Albumin/Globulin Ratio: 2.2 (ref 1.2–2.2)
Albumin: 4.1 g/dL (ref 3.6–4.8)
Alkaline Phosphatase: 46 IU/L (ref 39–117)
BUN/Creatinine Ratio: 27 — ABNORMAL HIGH (ref 10–24)
BUN: 26 mg/dL (ref 8–27)
Bilirubin Total: 0.4 mg/dL (ref 0.0–1.2)
CO2: 25 mmol/L (ref 20–29)
Calcium: 9.1 mg/dL (ref 8.6–10.2)
Chloride: 105 mmol/L (ref 96–106)
Creatinine, Ser: 0.97 mg/dL (ref 0.76–1.27)
GFR calc Af Amer: 96 mL/min/{1.73_m2} (ref >59)
GFR calc non Af Amer: 83 mL/min/{1.73_m2} (ref >59)
Globulin, Total: 1.9 g/dL (ref 1.5–4.5)
Glucose: 116 mg/dL — ABNORMAL HIGH (ref 65–99)
Potassium: 3.9 mmol/L (ref 3.5–5.2)
Sodium: 145 mmol/L — ABNORMAL HIGH (ref 134–144)
Total Protein: 6 g/dL (ref 6.0–8.5)

## 2017-02-19 LAB — CBC WITH DIFFERENTIAL/PLATELET
Basophils Absolute: 0 10*3/uL (ref 0.0–0.2)
Basos: 0 %
EOS (ABSOLUTE): 0.1 10*3/uL (ref 0.0–0.4)
Eos: 3 %
Hematocrit: 42.6 % (ref 37.5–51.0)
Hemoglobin: 14.3 g/dL (ref 13.0–17.7)
Immature Grans (Abs): 0 10*3/uL (ref 0.0–0.1)
Immature Granulocytes: 1 %
Lymphocytes Absolute: 1.2 10*3/uL (ref 0.7–3.1)
Lymphs: 30 %
MCH: 31.9 pg (ref 26.6–33.0)
MCHC: 33.6 g/dL (ref 31.5–35.7)
MCV: 95 fL (ref 79–97)
Monocytes Absolute: 0.4 10*3/uL (ref 0.1–0.9)
Monocytes: 11 %
Neutrophils Absolute: 2.3 10*3/uL (ref 1.4–7.0)
Neutrophils: 55 %
Platelets: 66 10*3/uL — CL (ref 150–379)
RBC: 4.48 x10E6/uL (ref 4.14–5.80)
RDW: 15.3 % (ref 12.3–15.4)
WBC: 4.1 10*3/uL (ref 3.4–10.8)

## 2017-02-19 LAB — VALPROIC ACID LEVEL: Valproic Acid Lvl: 114 ug/mL — ABNORMAL HIGH (ref 50–100)

## 2017-02-19 LAB — LAMOTRIGINE LEVEL: Lamotrigine Lvl: 4.1 ug/mL (ref 2.0–20.0)

## 2017-02-21 NOTE — Progress Notes (Signed)
Personally  participated in, made any corrections needed, and agree with history, physical, neuro exam,assessment and plan as stated.     Lashauna Arpin, MD [facility]     

## 2017-02-23 ENCOUNTER — Ambulatory Visit: Payer: Medicare Other | Admitting: Physician Assistant

## 2017-02-24 ENCOUNTER — Telehealth: Payer: Self-pay | Admitting: *Deleted

## 2017-02-24 DIAGNOSIS — Z5181 Encounter for therapeutic drug level monitoring: Secondary | ICD-10-CM

## 2017-02-24 NOTE — Telephone Encounter (Signed)
Patient calling back stating he can come around 8 tomorrow morning for fasting lab. A returned call is not needed unless questions.

## 2017-02-24 NOTE — Telephone Encounter (Signed)
Order placed

## 2017-02-24 NOTE — Telephone Encounter (Signed)
Spoke with patient and informed ihm that his Depakote level was slightly elevated. However this was not a trough level. Advised him if he'll come in first thing in the morning before he takes his morning dose of Depakote, the lab can be repeated. Advised his platelet level was low, however this has been chronic. Patient stated he has to call his sister to see if she can bring him in the morning for repeat lab. He will call back. He verbalized understanding, appreciation of call.  Lab results will be faxed to PCP, Tereasa Coop, PA. Patient called, spoke with phone staff. He stated he will come in at 8 am tomorrow for repeat lab.

## 2017-02-25 ENCOUNTER — Other Ambulatory Visit (INDEPENDENT_AMBULATORY_CARE_PROVIDER_SITE_OTHER): Payer: Self-pay

## 2017-02-25 DIAGNOSIS — Z0289 Encounter for other administrative examinations: Secondary | ICD-10-CM

## 2017-02-25 DIAGNOSIS — Z5181 Encounter for therapeutic drug level monitoring: Secondary | ICD-10-CM

## 2017-02-25 NOTE — Telephone Encounter (Signed)
appt 02-18-17 scheduled.

## 2017-02-26 ENCOUNTER — Telehealth: Payer: Self-pay | Admitting: *Deleted

## 2017-02-26 LAB — VALPROIC ACID LEVEL: Valproic Acid Lvl: 83 ug/mL (ref 50–100)

## 2017-02-26 NOTE — Telephone Encounter (Signed)
Spoke with patient and informed him his Depakote level is improved, no medication changes. He asked if his tremors would get worse. This RN advised that it is difficult to predict, however if they get notably worse he should call back to be seen sooner. Patient verbalized understanding, appreciation for call.

## 2017-03-02 ENCOUNTER — Ambulatory Visit: Payer: Medicare Other | Admitting: Physician Assistant

## 2017-03-03 ENCOUNTER — Telehealth: Payer: Self-pay | Admitting: Physician Assistant

## 2017-03-03 NOTE — Telephone Encounter (Signed)
Pt is needing to talk with [name] regarding the depression medication that was given to him by [facility] and he believes that it has given him a rash  Best number [phone]

## 2017-03-03 NOTE — Telephone Encounter (Signed)
Please advise 

## 2017-03-04 ENCOUNTER — Telehealth: Payer: Self-pay | Admitting: Adult Health

## 2017-03-04 ENCOUNTER — Telehealth: Payer: Self-pay | Admitting: Physician Assistant

## 2017-03-04 NOTE — Telephone Encounter (Addendum)
LVM for Darren Rocks NP advising him the patient is trying to reach him re: new medication ordered. Advised this RN will call the patient and have him call F Amemtsro again. Left this office number. Called patient and advised him this RN was able to LVM  Gave him phone number to call F Amematsro. Patient then stated that he thinks  the medication is also for seizures. This RN advised that it would not be tizanidine. Discussed Lamictal prescribed by [facility] MD and depakote prescribed by PCP and advised patient to call both providers today. He stated he called his PCP yesterday, has not heard back. He has already taken his morning doses of both.  Advised he call both providers this morning before next dose of medication this afternoon. He verbalized understanding, agreement, appreciation.
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
## 2017-03-04 NOTE — Telephone Encounter (Signed)
Patient stated he woke up today with a rash from his waist down to his feet and he thinks it is from the medication that Legrand Como gave him for his seizures.   His call back number is [phone].

## 2017-03-05 ENCOUNTER — Encounter: Payer: Self-pay | Admitting: Physician Assistant

## 2017-03-05 ENCOUNTER — Ambulatory Visit (INDEPENDENT_AMBULATORY_CARE_PROVIDER_SITE_OTHER): Payer: Medicare Other | Admitting: Physician Assistant

## 2017-03-05 VITALS — BP 105/71 | HR 76 | Temp 97.8°F | Resp 16 | Ht 69.0 in | Wt 206.0 lb

## 2017-03-05 DIAGNOSIS — R233 Spontaneous ecchymoses: Secondary | ICD-10-CM | POA: Diagnosis not present

## 2017-03-05 LAB — POCT CBC
Granulocyte percent: 66.5 %G (ref 37–80)
HCT, POC: 46.7 % (ref 43.5–53.7)
Hemoglobin: 15.1 g/dL (ref 14.1–18.1)
Lymph, poc: 1.1 (ref 0.6–3.4)
MCH, POC: 31.3 pg — AB (ref 27–31.2)
MCHC: 32.3 g/dL (ref 31.8–35.4)
MCV: 96.9 fL (ref 80–97)
MID (cbc): 0.3 (ref 0–0.9)
MPV: 6.8 fL (ref 0–99.8)
POC Granulocyte: 2.7 (ref 2–6.9)
POC LYMPH PERCENT: 27.4 %L (ref 10–50)
POC MID %: 6.1 %M (ref 0–12)
Platelet Count, POC: 57 10*3/uL — AB (ref 142–424)
RBC: 4.82 M/uL (ref 4.69–6.13)
RDW, POC: 15 %
WBC: 4.1 10*3/uL — AB (ref 4.6–10.2)

## 2017-03-05 LAB — POCT SEDIMENTATION RATE: POCT SED RATE: 2 mm/hr (ref 0–22)
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
## 2017-03-05 NOTE — Patient Instructions (Addendum)
If you notice your gums starting to bleed, or you start bleeding from your rectum then come back or go to the ED immediately. Come back on Monday and I will work you in.     IF you received an x-ray today, you will receive an invoice from [facility] Rad[facility]. Please contact [facility] at [phone] with questions or concerns regarding your invoice.   IF you received labwork today, you will receive an invoice from [place]. Please contact [facility] at [phone] with questions or concerns regarding your invoice.   Our billing staff will not be able to assist you with questions regarding bills from these companies.  You will be contacted with the lab results as soon as they are available. The fastest way to get your results is to activate your My Chart account. Instructions are located on the last page of this paperwork. If you have not heard from [place] regarding the results in 2 weeks, please contact this office.

## 2017-03-05 NOTE — Telephone Encounter (Signed)
I spoke with patient. The rash is on his legs and does not itch or cause pain.  No lip, throat, tongue swelling. Given his history of thrombocytopenia advised that he come directly here ASAP for eval.  He has no ride for this.  Advised that he call 911 for transport to the ED for a rule out of platelets less than 10K.  [name], MS, PA-C 1:45 PM, 03/05/2017

## 2017-03-06 LAB — HEPATIC FUNCTION PANEL
ALT: 26 IU/L (ref 0–44)
AST: 33 IU/L (ref 0–40)
Albumin: 4.4 g/dL (ref 3.6–4.8)
Alkaline Phosphatase: 47 IU/L (ref 39–117)
Bilirubin Total: 0.5 mg/dL (ref 0.0–1.2)
Bilirubin, Direct: 0.2 mg/dL (ref 0.00–0.40)
Total Protein: 6.6 g/dL (ref 6.0–8.5)

## 2017-03-06 LAB — PROTIME-INR
INR: 1.1 (ref 0.8–1.2)
Prothrombin Time: 11.8 s (ref 9.1–12.0)

## 2017-03-06 NOTE — Telephone Encounter (Signed)
Sister called stating brother received Dx of [place] Syndrome as of 03/05/17. Sister Claiborne Billings is asking for consultation. They are awaiting blood work but pt has learned that this is very progressive and is concerned. Requesting phone call consultation.  [phone] please call  Asking to speak with [name]

## 2017-03-09 ENCOUNTER — Ambulatory Visit (INDEPENDENT_AMBULATORY_CARE_PROVIDER_SITE_OTHER): Payer: Medicare Other | Admitting: Physician Assistant

## 2017-03-09 ENCOUNTER — Other Ambulatory Visit: Payer: Self-pay | Admitting: Physician Assistant

## 2017-03-09 ENCOUNTER — Ambulatory Visit (HOSPITAL_BASED_OUTPATIENT_CLINIC_OR_DEPARTMENT_OTHER): Payer: Self-pay

## 2017-03-09 ENCOUNTER — Telehealth: Payer: Self-pay

## 2017-03-09 ENCOUNTER — Encounter: Payer: Self-pay | Admitting: Physician Assistant

## 2017-03-09 ENCOUNTER — Ambulatory Visit (HOSPITAL_BASED_OUTPATIENT_CLINIC_OR_DEPARTMENT_OTHER): Payer: Self-pay | Admitting: Hematology

## 2017-03-09 ENCOUNTER — Telehealth: Payer: Self-pay | Admitting: Hematology

## 2017-03-09 VITALS — BP 132/91 | HR 77 | Temp 98.3°F | Resp 20 | Ht 69.0 in | Wt 211.4 lb

## 2017-03-09 VITALS — BP 122/80 | HR 70 | Temp 98.0°F | Resp 16 | Ht 69.0 in | Wt 209.2 lb

## 2017-03-09 DIAGNOSIS — D696 Thrombocytopenia, unspecified: Secondary | ICD-10-CM | POA: Diagnosis not present

## 2017-03-09 DIAGNOSIS — I959 Hypotension, unspecified: Secondary | ICD-10-CM

## 2017-03-09 DIAGNOSIS — R233 Spontaneous ecchymoses: Secondary | ICD-10-CM | POA: Diagnosis not present

## 2017-03-09 DIAGNOSIS — M7989 Other specified soft tissue disorders: Secondary | ICD-10-CM

## 2017-03-09 DIAGNOSIS — Z720 Tobacco use: Secondary | ICD-10-CM

## 2017-03-09 DIAGNOSIS — D72819 Decreased white blood cell count, unspecified: Secondary | ICD-10-CM

## 2017-03-09 DIAGNOSIS — Z23 Encounter for immunization: Secondary | ICD-10-CM | POA: Diagnosis not present

## 2017-03-09 DIAGNOSIS — D731 Hypersplenism: Secondary | ICD-10-CM

## 2017-03-09 LAB — COMPREHENSIVE METABOLIC PANEL
ALT: 24 U/L (ref 0–55)
AST: 28 U/L (ref 5–34)
Albumin: 3.8 g/dL (ref 3.5–5.0)
Alkaline Phosphatase: 52 U/L (ref 40–150)
Anion Gap: 6 mEq/L (ref 3–11)
BUN: 17.2 mg/dL (ref 7.0–26.0)
CO2: 25 mEq/L (ref 22–29)
Calcium: 9.1 mg/dL (ref 8.4–10.4)
Chloride: 112 mEq/L — ABNORMAL HIGH (ref 98–109)
Creatinine: 0.9 mg/dL (ref 0.7–1.3)
EGFR: >90 mL/min/{1.73_m2} (ref >90)
Glucose: 91 mg/dl (ref 70–140)
Potassium: 4.2 mEq/L (ref 3.5–5.1)
Sodium: 143 mEq/L (ref 136–145)
Total Bilirubin: 0.56 mg/dL (ref 0.20–1.20)
Total Protein: 6.4 g/dL (ref 6.4–8.3)

## 2017-03-09 LAB — POCT CBC
Granulocyte percent: 56.4 %G (ref 37–80)
HCT, POC: 42.6 % — AB (ref 43.5–53.7)
Hemoglobin: 14.6 g/dL (ref 14.1–18.1)
Lymph, poc: 1.1 (ref 0.6–3.4)
MCH, POC: 32.3 pg — AB (ref 27–31.2)
MCHC: 34.3 g/dL (ref 31.8–35.4)
MCV: 94.1 fL (ref 80–97)
MID (cbc): 0.3 (ref 0–0.9)
MPV: 7.2 fL (ref 0–99.8)
POC Granulocyte: 1.8 — AB (ref 2–6.9)
POC LYMPH PERCENT: 34.4 %L (ref 10–50)
POC MID %: 9.2 %M (ref 0–12)
Platelet Count, POC: 39 10*3/uL — AB (ref 142–424)
RBC: 4.52 M/uL — AB (ref 4.69–6.13)
RDW, POC: 14.4 %
WBC: 3.2 10*3/uL — AB (ref 4.6–10.2)

## 2017-03-09 LAB — POCT URINALYSIS DIP (MANUAL ENTRY)
Blood, UA: NEGATIVE
Glucose, UA: NEGATIVE mg/dL
Ketones, POC UA: NEGATIVE mg/dL
Leukocytes, UA: NEGATIVE
Nitrite, UA: NEGATIVE
Protein Ur, POC: NEGATIVE mg/dL
Spec Grav, UA: >=1.03 — AB (ref 1.010–1.025)
Urobilinogen, UA: 1 E.U./dL
pH, UA: 6 (ref 5.0–8.0)

## 2017-03-09 LAB — CBC & DIFF AND RETIC
BASO%: 0.5 % (ref 0.0–2.0)
Basophils Absolute: 0 10*3/uL (ref 0.0–0.1)
EOS%: 3.5 % (ref 0.0–7.0)
Eosinophils Absolute: 0.1 10*3/uL (ref 0.0–0.5)
HCT: 41.7 % (ref 38.4–49.9)
HGB: 14.4 g/dL (ref 13.0–17.1)
Immature Retic Fract: 3.4 % (ref 3.00–10.60)
LYMPH%: 31.7 % (ref 14.0–49.0)
MCH: 32.3 pg (ref 27.2–33.4)
MCHC: 34.5 g/dL (ref 32.0–36.0)
MCV: 93.5 fL (ref 79.3–98.0)
MONO#: 0.7 10*3/uL (ref 0.1–0.9)
MONO%: 17.4 % — ABNORMAL HIGH (ref 0.0–14.0)
NEUT#: 1.9 10*3/uL (ref 1.5–6.5)
NEUT%: 46.9 % (ref 39.0–75.0)
Platelets: 42 10*3/uL — ABNORMAL LOW (ref 140–400)
RBC: 4.46 10*6/uL (ref 4.20–5.82)
RDW: 14.6 % (ref 11.0–14.6)
Retic %: 1.28 % (ref 0.80–1.80)
Retic Ct Abs: 57.09 10*3/uL (ref 34.80–93.90)
WBC: 4 10*3/uL (ref 4.0–10.3)
lymph#: 1.3 10*3/uL (ref 0.9–3.3)

## 2017-03-09 LAB — HEMOCCULT GUIAC POC 1CARD (OFFICE)
Card #2 Fecal Occult Blod, POC: NEGATIVE
Fecal Occult Blood, POC: NEGATIVE

## 2017-03-09 MED ORDER — SODIUM CHLORIDE 0.9 % IV BOLUS (SEPSIS)
1000.0000 mL | Freq: Once | INTRAVENOUS | Status: AC
  Administered 2017-03-09: 1000 mL via INTRAVENOUS
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
## 2017-03-09 NOTE — Telephone Encounter (Signed)
Spoke with Claiborne Billings, relation of pt this morning. [name], PA called earlier requesting pt to be seen urgently by Dr. Irene Limbo or to be admitted. Pt plt drop from 50,000, which has been pt normal per Dr. Irene Limbo. Able to get pt scheduled for 2:30 appt in our office today. Pt and family member to discuss fatigue, mysterious rash, and potential need for hospitalization with Dr. Irene Limbo.
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
## 2017-03-09 NOTE — Patient Instructions (Addendum)
[place] at [name], [address], [place], [place] [address] 2:30pm    IF you received an x-ray today, you will receive an invoice from [facility]. Please contact [facility] at [phone] with questions or concerns regarding your invoice.   IF you received labwork today, you will receive an invoice from [facility]. Please contact [facility] at [phone] with questions or concerns regarding your invoice.   Our billing staff will not be able to assist you with questions regarding bills from these companies.  You will be contacted with the lab results as soon as they are available. The fastest way to get your results is to activate your My Chart account. Instructions are located on the last page of this paperwork. If you have not heard from [place] regarding the results in 2 weeks, please contact this office.

## 2017-03-09 NOTE — Telephone Encounter (Signed)
Scheduled appt per 9/24 los - Gave patient AVS -

## 2017-03-10 NOTE — Telephone Encounter (Signed)
Patient seen yesterday/ S.Dorris Vangorder,CMA

## 2017-03-15 LAB — PATHOLOGIST SMEAR REVIEW
Basophils Absolute: 0 10*3/uL (ref 0.0–0.2)
Basos: 0 %
EOS (ABSOLUTE): 0.1 10*3/uL (ref 0.0–0.4)
Eos: 4 %
Hematocrit: 41.9 % (ref 37.5–51.0)
Hemoglobin: 14.6 g/dL (ref 13.0–17.7)
Immature Grans (Abs): 0 10*3/uL (ref 0.0–0.1)
Immature Granulocytes: 0 %
Lymphocytes Absolute: 1 10*3/uL (ref 0.7–3.1)
Lymphs: 33 %
MCH: 32.7 pg (ref 26.6–33.0)
MCHC: 34.8 g/dL (ref 31.5–35.7)
MCV: 94 fL (ref 79–97)
Monocytes Absolute: 0.5 10*3/uL (ref 0.1–0.9)
Monocytes: 15 %
Neutrophils Absolute: 1.5 10*3/uL (ref 1.4–7.0)
Neutrophils: 48 %
Path Rev RBC: NORMAL
Platelets: 39 10*3/uL — CL (ref 150–379)
RBC: 4.46 x10E6/uL (ref 4.14–5.80)
RDW: 15.2 % (ref 12.3–15.4)
WBC: 3.1 10*3/uL — ABNORMAL LOW (ref 3.4–10.8)

## 2017-04-13 ENCOUNTER — Encounter: Payer: Self-pay | Admitting: Physician Assistant

## 2017-04-13 ENCOUNTER — Ambulatory Visit (INDEPENDENT_AMBULATORY_CARE_PROVIDER_SITE_OTHER): Payer: Medicare Other | Admitting: Physician Assistant

## 2017-04-13 VITALS — BP 110/62 | HR 74 | Resp 16 | Ht 69.0 in | Wt 223.8 lb

## 2017-04-13 DIAGNOSIS — Z23 Encounter for immunization: Secondary | ICD-10-CM

## 2017-04-13 DIAGNOSIS — F17200 Nicotine dependence, unspecified, uncomplicated: Secondary | ICD-10-CM | POA: Diagnosis not present

## 2017-04-13 MED ORDER — ZOSTER VAC RECOMB ADJUVANTED 50 MCG/0.5ML IM SUSR: 0.5000 mL | Freq: Once | INTRAMUSCULAR | 1 refills | Status: AC

## 2017-04-13 MED ORDER — NICOTINE 7 MG/24HR TD PT24: 7.0000 mg | MEDICATED_PATCH | Freq: Every day | TRANSDERMAL | 3 refills | Status: DC
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
## 2017-04-13 NOTE — Patient Instructions (Signed)
     IF you received an x-ray today, you will receive an invoice from [facility]. Please contact [facility] at [phone] with questions or concerns regarding your invoice.   IF you received labwork today, you will receive an invoice from [facility]. Please contact [facility] at [phone] with questions or concerns regarding your invoice.   Our billing staff will not be able to assist you with questions regarding bills from these companies.  You will be contacted with the lab results as soon as they are available. The fastest way to get your results is to activate your My Chart account. Instructions are located on the last page of this paperwork. If you have not heard from us regarding the results in 2 weeks, please contact this office.     

## 2017-04-21 ENCOUNTER — Other Ambulatory Visit: Payer: Medicare Other

## 2017-04-21 ENCOUNTER — Ambulatory Visit: Payer: Medicare Other | Admitting: Hematology

## 2017-04-22 ENCOUNTER — Other Ambulatory Visit: Payer: Self-pay | Admitting: Physician Assistant

## 2017-04-23 NOTE — Telephone Encounter (Signed)
Not seen as a current medication prescribed by provider in chart

## 2017-05-04 ENCOUNTER — Other Ambulatory Visit: Payer: Self-pay | Admitting: Physician Assistant

## 2017-05-04 ENCOUNTER — Ambulatory Visit: Payer: Medicare Other | Admitting: Physician Assistant

## 2017-05-04 DIAGNOSIS — F331 Major depressive disorder, recurrent, moderate: Secondary | ICD-10-CM

## 2017-05-05 ENCOUNTER — Other Ambulatory Visit: Payer: Medicaid Other

## 2017-05-05 ENCOUNTER — Ambulatory Visit: Payer: Medicare Other | Admitting: Hematology

## 2017-05-26 ENCOUNTER — Ambulatory Visit: Payer: Medicare Other | Admitting: Adult Health

## 2017-06-22 ENCOUNTER — Other Ambulatory Visit: Payer: Self-pay | Admitting: Physician Assistant

## 2017-06-22 DIAGNOSIS — G40409 Other generalized epilepsy and epileptic syndromes, not intractable, without status epilepticus: Secondary | ICD-10-CM

## 2017-06-23 DIAGNOSIS — I10 Essential (primary) hypertension: Secondary | ICD-10-CM | POA: Diagnosis not present

## 2017-06-23 DIAGNOSIS — E7800 Pure hypercholesterolemia, unspecified: Secondary | ICD-10-CM | POA: Diagnosis not present

## 2017-06-23 DIAGNOSIS — I5032 Chronic diastolic (congestive) heart failure: Secondary | ICD-10-CM | POA: Diagnosis not present

## 2017-06-23 DIAGNOSIS — I2510 Atherosclerotic heart disease of native coronary artery without angina pectoris: Secondary | ICD-10-CM | POA: Diagnosis not present

## 2017-06-24 ENCOUNTER — Other Ambulatory Visit: Payer: Self-pay | Admitting: Physician Assistant

## 2017-06-24 ENCOUNTER — Ambulatory Visit: Payer: Medicare Other | Admitting: Neurology

## 2017-06-24 DIAGNOSIS — G40409 Other generalized epilepsy and epileptic syndromes, not intractable, without status epilepticus: Secondary | ICD-10-CM

## 2017-06-24 NOTE — Telephone Encounter (Signed)
Refill req Depakote sent to [facility]

## 2017-06-25 ENCOUNTER — Encounter: Payer: Self-pay | Admitting: Neurology

## 2017-07-22 ENCOUNTER — Other Ambulatory Visit: Payer: Self-pay | Admitting: Physician Assistant

## 2017-07-22 NOTE — Telephone Encounter (Signed)
zanaflex refill Last OV: 04/13/17 Last Refill:04/22/17 Pharmacy:[facility]

## 2017-07-29 DIAGNOSIS — R0602 Shortness of breath: Secondary | ICD-10-CM | POA: Diagnosis not present

## 2017-08-12 DIAGNOSIS — I2510 Atherosclerotic heart disease of native coronary artery without angina pectoris: Secondary | ICD-10-CM | POA: Diagnosis not present

## 2017-08-12 DIAGNOSIS — E7800 Pure hypercholesterolemia, unspecified: Secondary | ICD-10-CM | POA: Diagnosis not present

## 2017-08-12 DIAGNOSIS — I5032 Chronic diastolic (congestive) heart failure: Secondary | ICD-10-CM | POA: Diagnosis not present

## 2017-08-12 DIAGNOSIS — I10 Essential (primary) hypertension: Secondary | ICD-10-CM | POA: Diagnosis not present

## 2017-08-17 ENCOUNTER — Emergency Department (HOSPITAL_COMMUNITY): Payer: Medicare Other

## 2017-08-17 ENCOUNTER — Telehealth: Payer: Self-pay | Admitting: *Deleted

## 2017-08-17 ENCOUNTER — Encounter (INDEPENDENT_AMBULATORY_CARE_PROVIDER_SITE_OTHER): Payer: Self-pay

## 2017-08-17 ENCOUNTER — Encounter: Payer: Self-pay | Admitting: Neurology

## 2017-08-17 ENCOUNTER — Observation Stay (HOSPITAL_COMMUNITY)
Admission: EM | Admit: 2017-08-17 | Discharge: 2017-08-18 | Disposition: A | Discharge status: Home / Self Care | Payer: Medicare Other | Attending: Family Medicine | Admitting: Family Medicine

## 2017-08-17 ENCOUNTER — Ambulatory Visit (INDEPENDENT_AMBULATORY_CARE_PROVIDER_SITE_OTHER): Payer: Medicare Other | Admitting: Neurology

## 2017-08-17 ENCOUNTER — Other Ambulatory Visit: Payer: Self-pay

## 2017-08-17 VITALS — BP 68/48 | HR 60 | Ht 68.0 in | Wt 228.0 lb

## 2017-08-17 DIAGNOSIS — I25118 Atherosclerotic heart disease of native coronary artery with other forms of angina pectoris: Secondary | ICD-10-CM | POA: Insufficient documentation

## 2017-08-17 DIAGNOSIS — G40909 Epilepsy, unspecified, not intractable, without status epilepticus: Secondary | ICD-10-CM | POA: Insufficient documentation

## 2017-08-17 DIAGNOSIS — I252 Old myocardial infarction: Secondary | ICD-10-CM | POA: Diagnosis not present

## 2017-08-17 DIAGNOSIS — F1911 Other psychoactive substance abuse, in remission: Secondary | ICD-10-CM

## 2017-08-17 DIAGNOSIS — Z79899 Other long term (current) drug therapy: Secondary | ICD-10-CM | POA: Insufficient documentation

## 2017-08-17 DIAGNOSIS — I509 Heart failure, unspecified: Secondary | ICD-10-CM | POA: Diagnosis not present

## 2017-08-17 DIAGNOSIS — J439 Emphysema, unspecified: Secondary | ICD-10-CM | POA: Diagnosis not present

## 2017-08-17 DIAGNOSIS — F3170 Bipolar disorder, currently in remission, most recent episode unspecified: Secondary | ICD-10-CM

## 2017-08-17 DIAGNOSIS — F319 Bipolar disorder, unspecified: Secondary | ICD-10-CM | POA: Insufficient documentation

## 2017-08-17 DIAGNOSIS — F17290 Nicotine dependence, other tobacco product, uncomplicated: Secondary | ICD-10-CM | POA: Insufficient documentation

## 2017-08-17 DIAGNOSIS — E785 Hyperlipidemia, unspecified: Secondary | ICD-10-CM | POA: Insufficient documentation

## 2017-08-17 DIAGNOSIS — R0789 Other chest pain: Secondary | ICD-10-CM | POA: Diagnosis not present

## 2017-08-17 DIAGNOSIS — F419 Anxiety disorder, unspecified: Secondary | ICD-10-CM | POA: Diagnosis not present

## 2017-08-17 DIAGNOSIS — D61818 Other pancytopenia: Secondary | ICD-10-CM | POA: Diagnosis not present

## 2017-08-17 DIAGNOSIS — I4581 Long QT syndrome: Secondary | ICD-10-CM | POA: Diagnosis not present

## 2017-08-17 DIAGNOSIS — R001 Bradycardia, unspecified: Secondary | ICD-10-CM | POA: Diagnosis not present

## 2017-08-17 DIAGNOSIS — I950 Idiopathic hypotension: Secondary | ICD-10-CM

## 2017-08-17 DIAGNOSIS — Z8619 Personal history of other infectious and parasitic diseases: Secondary | ICD-10-CM | POA: Insufficient documentation

## 2017-08-17 DIAGNOSIS — I959 Hypotension, unspecified: Secondary | ICD-10-CM | POA: Diagnosis not present

## 2017-08-17 DIAGNOSIS — F1410 Cocaine abuse, uncomplicated: Secondary | ICD-10-CM | POA: Insufficient documentation

## 2017-08-17 DIAGNOSIS — K7460 Unspecified cirrhosis of liver: Secondary | ICD-10-CM | POA: Insufficient documentation

## 2017-08-17 DIAGNOSIS — R0602 Shortness of breath: Secondary | ICD-10-CM | POA: Diagnosis not present

## 2017-08-17 DIAGNOSIS — G4700 Insomnia, unspecified: Secondary | ICD-10-CM | POA: Insufficient documentation

## 2017-08-17 DIAGNOSIS — I110 Hypertensive heart disease with heart failure: Secondary | ICD-10-CM | POA: Diagnosis not present

## 2017-08-17 DIAGNOSIS — R031 Nonspecific low blood-pressure reading: Secondary | ICD-10-CM | POA: Diagnosis not present

## 2017-08-17 DIAGNOSIS — F1210 Cannabis abuse, uncomplicated: Secondary | ICD-10-CM | POA: Insufficient documentation

## 2017-08-17 LAB — PROTIME-INR
INR: 1.22
Prothrombin Time: 15.3 seconds — ABNORMAL HIGH (ref 11.4–15.2)

## 2017-08-17 LAB — BASIC METABOLIC PANEL
Anion gap: 9 (ref 5–15)
BUN: 26 mg/dL — ABNORMAL HIGH (ref 6–20)
CO2: 22 mmol/L (ref 22–32)
Calcium: 8.5 mg/dL — ABNORMAL LOW (ref 8.9–10.3)
Chloride: 111 mmol/L (ref 101–111)
Creatinine, Ser: 1.01 mg/dL (ref 0.61–1.24)
GFR calc Af Amer: >60 mL/min (ref >60)
GFR calc non Af Amer: >60 mL/min (ref >60)
Glucose, Bld: 95 mg/dL (ref 65–99)
Potassium: 3.9 mmol/L (ref 3.5–5.1)
Sodium: 142 mmol/L (ref 135–145)

## 2017-08-17 LAB — CBC
HCT: 36.6 % — ABNORMAL LOW (ref 39.0–52.0)
Hemoglobin: 12.6 g/dL — ABNORMAL LOW (ref 13.0–17.0)
MCH: 32.3 pg (ref 26.0–34.0)
MCHC: 34.4 g/dL (ref 30.0–36.0)
MCV: 93.8 fL (ref 78.0–100.0)
Platelets: 54 10*3/uL — ABNORMAL LOW (ref 150–400)
RBC: 3.9 MIL/uL — ABNORMAL LOW (ref 4.22–5.81)
RDW: 14.6 % (ref 11.5–15.5)
WBC: 3.5 10*3/uL — ABNORMAL LOW (ref 4.0–10.5)

## 2017-08-17 LAB — VALPROIC ACID LEVEL: Valproic Acid Lvl: 62 ug/mL (ref 50.0–100.0)

## 2017-08-17 LAB — TSH: TSH: 0.752 u[IU]/mL (ref 0.350–4.500)

## 2017-08-17 LAB — RAPID URINE DRUG SCREEN, HOSP PERFORMED
Amphetamines: NOT DETECTED
Barbiturates: NOT DETECTED
Benzodiazepines: NOT DETECTED
Cocaine: POSITIVE — AB
Opiates: NOT DETECTED
Tetrahydrocannabinol: POSITIVE — AB

## 2017-08-17 LAB — I-STAT TROPONIN, ED: Troponin i, poc: 0 ng/mL (ref 0.00–0.08)

## 2017-08-17 LAB — MAGNESIUM: Magnesium: 1.9 mg/dL (ref 1.7–2.4)

## 2017-08-17 LAB — TROPONIN I: Troponin I: <0.03 ng/mL (ref <0.03)

## 2017-08-17 MED ORDER — DIVALPROEX SODIUM 250 MG PO DR TAB
500.0000 mg | DELAYED_RELEASE_TABLET | Freq: Two times a day (BID) | ORAL | Status: DC
  Administered 2017-08-17: 500 mg via ORAL
  Filled 2017-08-17: qty 2

## 2017-08-17 MED ORDER — ZOLPIDEM TARTRATE 5 MG PO TABS: 5.0000 mg | ORAL_TABLET | Freq: Every evening | ORAL | Status: DC | PRN

## 2017-08-17 MED ORDER — LACTULOSE 10 GM/15ML PO SOLN
10.0000 g | Freq: Three times a day (TID) | ORAL | Status: DC
  Administered 2017-08-18 (×2): 10 g via ORAL
  Filled 2017-08-17 (×2): qty 15

## 2017-08-17 MED ORDER — IBUPROFEN 400 MG PO TABS: 400.0000 mg | ORAL_TABLET | Freq: Three times a day (TID) | ORAL | Status: DC

## 2017-08-17 MED ORDER — ADULT MULTIVITAMIN W/MINERALS CH
1.0000 | ORAL_TABLET | Freq: Every day | ORAL | Status: DC
  Administered 2017-08-18: 1 via ORAL
  Filled 2017-08-17: qty 1

## 2017-08-17 MED ORDER — NICOTINE 14 MG/24HR TD PT24: 14.0000 mg | MEDICATED_PATCH | Freq: Every day | TRANSDERMAL | Status: DC | PRN

## 2017-08-17 MED ORDER — SUVOREXANT 15 MG PO TABS: 15.0000 mg | ORAL_TABLET | Freq: Every day | ORAL | Status: DC

## 2017-08-17 MED ORDER — DIVALPROEX SODIUM 250 MG PO DR TAB
500.0000 mg | DELAYED_RELEASE_TABLET | Freq: Three times a day (TID) | ORAL | Status: DC
  Administered 2017-08-18 (×3): 500 mg via ORAL
  Filled 2017-08-17 (×3): qty 2

## 2017-08-17 MED ORDER — LORAZEPAM 2 MG/ML IJ SOLN: 1.0000 mg | Freq: Four times a day (QID) | INTRAMUSCULAR | Status: DC | PRN

## 2017-08-17 MED ORDER — ALBUTEROL SULFATE (2.5 MG/3ML) 0.083% IN NEBU: 2.5000 mg | INHALATION_SOLUTION | Freq: Four times a day (QID) | RESPIRATORY_TRACT | Status: DC | PRN

## 2017-08-17 MED ORDER — QUETIAPINE FUMARATE ER 50 MG PO TB24: 150.0000 mg | ORAL_TABLET | Freq: Every day | ORAL | Status: DC

## 2017-08-17 MED ORDER — NITROGLYCERIN 0.4 MG SL SUBL: 0.4000 mg | SUBLINGUAL_TABLET | SUBLINGUAL | Status: DC | PRN

## 2017-08-17 MED ORDER — VITAMIN B-1 100 MG PO TABS
100.0000 mg | ORAL_TABLET | Freq: Every day | ORAL | Status: DC
  Administered 2017-08-18: 100 mg via ORAL
  Filled 2017-08-17: qty 1

## 2017-08-17 MED ORDER — ATORVASTATIN CALCIUM 10 MG PO TABS: 10.0000 mg | ORAL_TABLET | Freq: Every day | ORAL | Status: DC

## 2017-08-17 MED ORDER — SERTRALINE HCL 100 MG PO TABS
100.0000 mg | ORAL_TABLET | Freq: Every day | ORAL | Status: DC
  Administered 2017-08-18: 100 mg via ORAL
  Filled 2017-08-17: qty 1

## 2017-08-17 MED ORDER — SODIUM CHLORIDE 0.9% FLUSH
3.0000 mL | Freq: Two times a day (BID) | INTRAVENOUS | Status: DC
  Administered 2017-08-18 (×2): 3 mL via INTRAVENOUS

## 2017-08-17 MED ORDER — BUSPIRONE HCL 10 MG PO TABS
15.0000 mg | ORAL_TABLET | Freq: Three times a day (TID) | ORAL | Status: DC
  Administered 2017-08-18 (×2): 15 mg via ORAL
  Filled 2017-08-17 (×2): qty 3

## 2017-08-17 MED ORDER — THIAMINE HCL 100 MG/ML IJ SOLN: 100.0000 mg | Freq: Every day | INTRAMUSCULAR | Status: DC

## 2017-08-17 MED ORDER — ENOXAPARIN SODIUM 40 MG/0.4ML ~~LOC~~ SOLN: 40.0000 mg | SUBCUTANEOUS | Status: DC

## 2017-08-17 MED ORDER — ALBUTEROL SULFATE HFA 108 (90 BASE) MCG/ACT IN AERS: 2.0000 | INHALATION_SPRAY | Freq: Four times a day (QID) | RESPIRATORY_TRACT | Status: DC | PRN

## 2017-08-17 MED ORDER — LORAZEPAM 1 MG PO TABS: 1.0000 mg | ORAL_TABLET | Freq: Four times a day (QID) | ORAL | Status: DC | PRN

## 2017-08-17 MED ORDER — POTASSIUM CHLORIDE CRYS ER 20 MEQ PO TBCR: 40.0000 meq | EXTENDED_RELEASE_TABLET | Freq: Once | ORAL | Status: DC

## 2017-08-17 MED ORDER — MIRTAZAPINE 15 MG PO TABS: 15.0000 mg | ORAL_TABLET | Freq: Every day | ORAL | Status: DC

## 2017-08-17 MED ORDER — IBUPROFEN 400 MG PO TABS
400.0000 mg | ORAL_TABLET | Freq: Three times a day (TID) | ORAL | Status: DC | PRN
  Filled 2017-08-17: qty 1

## 2017-08-17 MED ORDER — SODIUM CHLORIDE 0.9 % IV BOLUS (SEPSIS)
1000.0000 mL | Freq: Once | INTRAVENOUS | Status: AC
  Administered 2017-08-17: 1000 mL via INTRAVENOUS

## 2017-08-17 MED ORDER — NICOTINE 7 MG/24HR TD PT24
7.0000 mg | MEDICATED_PATCH | Freq: Every day | TRANSDERMAL | Status: DC
  Administered 2017-08-18: 7 mg via TRANSDERMAL
  Filled 2017-08-17: qty 1

## 2017-08-17 MED ORDER — IBUPROFEN 200 MG PO TABS: 400.0000 mg | ORAL_TABLET | Freq: Three times a day (TID) | ORAL | Status: DC | PRN

## 2017-08-17 NOTE — Telephone Encounter (Signed)
While pt here for appt, he was noted to be hypotensive, 80/50 in R arm HR 54 and 68/48 60 in the L arm. He report being tired, light headed, dizzy when he stands. Dr. Jaynee Eagles aware, will send pt to ED. Attempted to call sister. Called 911 and spoke with dispatch, requested transfer to ED re: symptomatic hypotension, pt conversant.
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
## 2017-08-17 NOTE — Plan of Care (Signed)
Pt. Oriented to room. Stable and ambulatory. Pt. Told to call for stand by assistance when needing to ambulate in the room. Pt. Verbalized understanding.

## 2017-08-17 NOTE — Discharge Summary (Signed)
[facility] Discharge Summary  Patient name: [name] Medical record number: [id number] Date of birth: 1955-07-05 Age: 62 y.o. Gender: male Date of Admission: 08/17/2017  Date of Discharge: 08/18/17 Admitting Physician: Darren La, MD  Primary Care Provider: Tereasa Coop, PA-C Consultants: None  Indication for Hospitalization: Symptomatic bradycardia  Discharge Diagnoses/Problem List:  Symptomatic bradycardia, resolved QT prolongation, resolved Stable angina COPD HLD Seizure disorder Hepatic cirrhosis due to history of Hep C Bipolar Disorder Insomnia Polysubstance abuse (tobacco, cocaine, marijuana)  Disposition: Home  Discharge Condition: Stable, improved  Discharge Exam:  General:obese, NAD, conversational and alert  Eyes:clear sclera, no drainage, EOMI ENTM:poor dentition, no swelling/skin changes/no concern for airway Neck:no ROM deficits, no swelling Cardiovascular:Rate ~60, no murmurs noted, distal pulses intact Respiratory:no wheezes, no IWB, no cough/crackles, finger/tow clubbing Gastrointestinal:soft/obese but no gastro complaints, no TTP, bowel sounds throughout MSK:no deficits noted Derm:multiple tattoos, no lesions/rashes noted Neuro:grossly intact Psych:pleasant, appropriate  Brief Hospital Course:  [name] is a 62 year old male presenting to the ED with hypotension and bradycardia. He was at an appointment at his neurologist's office when he started "feeling bad" and getting lightheaded. He was told that his heartbeat was too slow and his blood pressure was low, so he was sent to the ED for further evaluation. In the ED, BPs were in the 80s/50s and HR was in the low 50s. He was given a 2L NS bolus and his BPs improved to 120s/70s. His hypotension and bradycardia were thought to be due medications (Metoprolol succinate, Lisinopril, Tizanidine) in the setting of poor water intake (patient states that he only drinks  [name]). EKG did not show any ST/T wave changes (just old TWI in II and III) and troponins were negative x 3. TSH was within normal limits. Metoprolol succinate, Lisinopril, and Tizanidine were held and were not restarted on discharge. Patient was asymptomatic with BP 122/65 and HR 58 at the time of discharge.  Patient also noted to have QT prolongation to 582. Likely secondary to Remeron and Seroquel. Mag and K were within normal limits. These medications were held and were not restarted on discharge.   The remainder of patient's chronic medical conditions were stable this hospitalization and no other changes were made to his home medications.  Issues for Follow Up:  1. Lisinopril, beta blocker, and tizanidine were held on discharge due to bradycardia and hypotension 2. Recommend not restarting Metoprolol due to cocaine use. 3. Patient had prolonged QT so remeron and seroquel were held and were not restarted on discharge. In light of bipolar and anxiety diagnosis, would recommend re-evaluation of his psych meds. He was also noted to be on a lot of serotonergic medications that could increase his risk of developing serotonin syndrome. Recommend changing his psych medications if possible. 4. Patient noted to have pancytopenia this admission, which is chronic for him. Please continue to follow as an outpatient.  Significant Procedures: none  Significant Labs and Imaging:  Recent Labs  Lab 08/17/17 1251 08/18/17 0808  WBC 3.5* 3.5*  HGB 12.6* 13.4  HCT 36.6* 38.5*  PLT 54* 53*   Recent Labs  Lab 08/17/17 1251 08/17/17 1837 08/18/17 0808  NA 142  --  143  K 3.9  --  3.9  CL 111  --  112*  CO2 22  --  22  GLUCOSE 95  --  101*  BUN 26*  --  18  CREATININE 1.01  --  0.91  CALCIUM 8.5*  --  8.2*  MG  --  1.9 1.9   Trop <0.03, <0.03, <0.03 Valproic acid level 62  Results/Tests Pending at Time of Discharge: none  Discharge Medications:  Allergies as of 08/18/2017   No Known  Allergies     Medication List    STOP taking these medications   mirtazapine 15 MG tablet Commonly known as:  REMERON   QUEtiapine Fumarate 150 MG 24 hr tablet Commonly known as:  SEROQUEL XR   tiZANidine 4 MG tablet Commonly known as:  ZANAFLEX     TAKE these medications   albuterol 108 (90 Base) MCG/ACT inhaler Commonly known as:  PROAIR HFA Inhale 2 puffs into the lungs every 6 (six) hours as needed for wheezing or shortness of breath.   atorvastatin 10 MG tablet Commonly known as:  LIPITOR Take 10 mg by mouth daily at 6 PM.   busPIRone 15 MG tablet Commonly known as:  BUSPAR Take 15 mg by mouth 3 (three) times daily.   divalproex 500 MG DR tablet Commonly known as:  DEPAKOTE TAKE 1 TABLET (500 MG TOTAL) BY MOUTH 3 TIMES DAILY.   hydrOXYzine 10 MG tablet Commonly known as:  ATARAX/VISTARIL TAKE 1 TABLET BY MOUTH 3 TIMES DAILY AS NEEDED.   lactulose 10 g packet Commonly known as:  CEPHULAC Take 1 packet (10 g total) by mouth 3 (three) times daily.   nicotine 7 mg/24hr patch Commonly known as:  NICODERM CQ - dosed in mg/24 hr Place 1 patch (7 mg total) onto the skin daily.   nitroGLYCERIN 0.4 MG SL tablet Commonly known as:  NITROSTAT Place 1 tablet (0.4 mg total) under the tongue every 5 (five) minutes as needed for chest pain.   sertraline 100 MG tablet Commonly known as:  ZOLOFT Take 100 mg by mouth daily.   SUPER B COMPLEX/VITAMIN C PO Take 1 tablet by mouth daily.   Suvorexant 15 MG Tabs Commonly known as:  BELSOMRA Take 15 mg by mouth at bedtime.   Vitamin D3 1000 units Caps Take 5,000 Units by mouth 2 (two) times daily.       Discharge Instructions: Please refer to Patient Instructions section of EMR for full details.  Patient was counseled important signs and symptoms that should prompt return to medical care, changes in medications, dietary instructions, activity restrictions, and follow up appointments.   Follow-Up  Appointments: Follow-up Information    Darren Coop, PA-C. Go on 08/27/2017.   Specialty:  Urgent Care Why:  @9 :20am Contact information: [address] [place] [address] [phone]           Benjamin Merrihew, Pete Pelt, MD 08/18/2017, 4:52 PM PGY-3, [place]

## 2017-08-17 NOTE — ED Triage Notes (Signed)
Pt arrives via EMS from [facility] where pt was being seen for seizure hx. Staff noted patient was not feeling well, lightheaded, dizzy sob and weakness. Bp 80/50 and 68/48, from office. 12lead Sinus Loletha Grayer. Just had medication dosing changed. Pt unsure of which medication was change. 18g Rac. cbg 130.

## 2017-08-17 NOTE — ED Notes (Signed)
Attempted to call report x 1  

## 2017-08-17 NOTE — Telephone Encounter (Signed)
Spoke with patient's sister Claiborne Billings (on [place]). She had spoken with the patient. Discussed that due to pt not feeling well and having low BP, patient was sent to ED. She was very appreciative and verbalized understanding.

## 2017-08-17 NOTE — Telephone Encounter (Signed)
Pts sister returning RNs call requesting a call back
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
## 2017-08-18 ENCOUNTER — Other Ambulatory Visit: Payer: Self-pay

## 2017-08-18 DIAGNOSIS — I4581 Long QT syndrome: Secondary | ICD-10-CM | POA: Diagnosis not present

## 2017-08-18 DIAGNOSIS — D61818 Other pancytopenia: Secondary | ICD-10-CM

## 2017-08-18 DIAGNOSIS — I110 Hypertensive heart disease with heart failure: Secondary | ICD-10-CM | POA: Diagnosis not present

## 2017-08-18 DIAGNOSIS — R001 Bradycardia, unspecified: Secondary | ICD-10-CM | POA: Diagnosis not present

## 2017-08-18 DIAGNOSIS — I959 Hypotension, unspecified: Secondary | ICD-10-CM | POA: Diagnosis not present

## 2017-08-18 DIAGNOSIS — G40909 Epilepsy, unspecified, not intractable, without status epilepticus: Secondary | ICD-10-CM

## 2017-08-18 DIAGNOSIS — I252 Old myocardial infarction: Secondary | ICD-10-CM | POA: Diagnosis not present

## 2017-08-18 DIAGNOSIS — I25118 Atherosclerotic heart disease of native coronary artery with other forms of angina pectoris: Secondary | ICD-10-CM | POA: Diagnosis not present

## 2017-08-18 DIAGNOSIS — F3170 Bipolar disorder, currently in remission, most recent episode unspecified: Secondary | ICD-10-CM | POA: Diagnosis not present

## 2017-08-18 DIAGNOSIS — I509 Heart failure, unspecified: Secondary | ICD-10-CM | POA: Diagnosis not present

## 2017-08-18 LAB — MAGNESIUM: Magnesium: 1.9 mg/dL (ref 1.7–2.4)

## 2017-08-18 LAB — TROPONIN I
Troponin I: <0.03 ng/mL (ref <0.03)
Troponin I: <0.03 ng/mL (ref <0.03)

## 2017-08-18 LAB — BASIC METABOLIC PANEL
Anion gap: 9 (ref 5–15)
BUN: 18 mg/dL (ref 6–20)
CO2: 22 mmol/L (ref 22–32)
Calcium: 8.2 mg/dL — ABNORMAL LOW (ref 8.9–10.3)
Chloride: 112 mmol/L — ABNORMAL HIGH (ref 101–111)
Creatinine, Ser: 0.91 mg/dL (ref 0.61–1.24)
GFR calc Af Amer: >60 mL/min (ref >60)
GFR calc non Af Amer: >60 mL/min (ref >60)
Glucose, Bld: 101 mg/dL — ABNORMAL HIGH (ref 65–99)
Potassium: 3.9 mmol/L (ref 3.5–5.1)
Sodium: 143 mmol/L (ref 135–145)

## 2017-08-18 LAB — CBC
HCT: 38.5 % — ABNORMAL LOW (ref 39.0–52.0)
Hemoglobin: 13.4 g/dL (ref 13.0–17.0)
MCH: 32.6 pg (ref 26.0–34.0)
MCHC: 34.8 g/dL (ref 30.0–36.0)
MCV: 93.7 fL (ref 78.0–100.0)
Platelets: 53 10*3/uL — ABNORMAL LOW (ref 150–400)
RBC: 4.11 MIL/uL — ABNORMAL LOW (ref 4.22–5.81)
RDW: 14.3 % (ref 11.5–15.5)
WBC: 3.5 10*3/uL — ABNORMAL LOW (ref 4.0–10.5)

## 2017-08-18 LAB — HIV ANTIBODY (ROUTINE TESTING W REFLEX): HIV Screen 4th Generation wRfx: NONREACTIVE

## 2017-08-18 NOTE — Consult Note (Signed)
Referral received but patient discharged prior to seeing patient regarding medication management needs and changes.  Call placed to [facility] [phone] and HIPAA verified.  Marvelous states that he and his sister have concerns for the changes in his medications and he would like the [facility] pharmacist to assist with understanding the changes.  Neco states, "please talk to my sister Claiborne Billings right here with me."  HIPAA verified and Claiborne Billings states her best number is [phone] and she has called [facility] to talk to them about the recommendations of his medication changes from the hospital.  They both agree to [facility] Pharmacist for post hospital assistance.  Will refer to [facility] Pharmacist.  Please contact:  Natividad Brood, RN BSN [facility] Liaison  [phone] business mobile phone Toll free office [phone]

## 2017-08-18 NOTE — Progress Notes (Signed)
. Family Medicine Teaching Service Daily Progress Note Intern Pager: [phone]  Patient name: [name] Medical record number: [id number] Date of birth: [birth year] Age: 62 y.o. Gender: male  Primary Care Provider: Tereasa Coop, PA-C Consultants: cards Code Status: full  Pt Overview and Major Events to Date:  [name] is a 62 y.o. male presenting with symptomatic bradycardia . PMH is significant for anxiety, Bipolar 1, CHF, COPD, Hep B, Hep C, hyperlipidemia, Seizures, recent substance use disorder, insomnia, stable angina  Assessment and Plan: [name] is a 62 y.o. male presenting with symptomatic bradycardia . PMH is significant for anxiety, Bipolar 1, CHF, COPD, Hep B, Hep C, hyperlipidemia, Seizures, recent substance use disorder, insomnia, stable angina  Symptomatic Bradycardia/Hypotension: HRs initially in the 50s-60s and BPs normotensive overnight.   No  chest pain complaints during admission. ECG negative for STEMI (has TWI in II and III, but these are not new) but could not rule out age indeterminate inferior infarct, no LOC, patient is prescribed and had been taking lisinopril, tizanidine and metoprolol.  TSH within normal limits. Follows with Dr. Einar Gip as an outpatient. -admit for tel observation, Dr. Nori Riis Attending -12hrs continuous cardiac monitoring -vitals q4 -hold metoprolol, tizanidine, and lisinopril in setting of bradycardia/hypotension and will not restart on discharge (also want to hold beta blocker with recent cocaine use) -encourage PO intake without IVF  QT Prolongation: QT 582 on EKG.  - Holding home Remeron and Seroquel  Stable angina- 1-2x year, with activity (bending) resolves with nitro, not changing in frequency -nitro prn  COPD: chronic, no respiratory complaints on admission -albuterol PRN  Hyperlipidemia: chronic. Recent lipid panel 12/2016 with chol 127, HDL 30, LDL 70, TG 137. -home atorvastatin  Seizures: no seizure  recently -home divalproic acid  Hepatic Cirrhosis due to hx of Hep C: chronic. Completed 12 week tx with Harvani in 2017 with negative test of cure 01/2016. Most recent abdominal US 01/2016 with coarse echogenicity of the liver consistent with cirrhosis.  -continue home lactulose 10g tid -check PT/INR  Bipolar Disorder: chronic -home setraline -home buspar -holding home Remeron and Seroquel due to QT prolongation (also concern that he is on a lot of serotonergic medications)  Insomnia: chronic -home suvorexant  Polysubstance Abuse: Currently using tobacco, cocaine, marijuana. UDS positive for cocaine and marijuana. Denies alcohol use. - CIWA (can d/c if 0's overnight) - Nicotine patch prn  FEN/GI: regular diet, multivitamin Prophylaxis: lovenox  Disposition: to home once medically cleared   Subjective:  Patient felt well and was asking to go home.   He understood concern about medication effects, and will discuss BP/rate control meds with doctor.  We specifically warned about danger of metoprolol with cocaine use and he understood.   We also discuess prolonged QT risk with multiple behavioral health drugs and he understand need to discuss plan with his pcp  Objective: Temp:  [97.8 F (36.6 C)-98.4 F (36.9 C)] 98.4 F (36.9 C) (03/05 0447) Pulse Rate:  [50-64] 56 (03/05 0447) Resp:  [11-22] 18 (03/05 0447) BP: (68-131)/(47-96) 124/74 (03/05 0447) SpO2:  [94 %-97 %] 95 % (03/05 0447) Weight:  [223 lb 14.4 oz (101.6 kg)-228 lb (103.4 kg)] 223 lb 14.4 oz (101.6 kg) (03/05 0447) Physical Exam: General: obese, NAD, conversational and alert  Eyes: clear sclera, no drainage, EOMI ENTM: poor dentition, no swelling/skin changes/no concern for airway Neck: no ROM deficits, no swelling Cardiovascular: Rate ~60, no murmurs noted, distal pulses intact Respiratory: no wheezes, no  IWB, no cough/crackles, finger/tow clubbing Gastrointestinal: soft/obese but no gastro complaints, no  TTP, bowel sounds throughout MSK: no deficits noted Derm: multiple tattoos, no lesions/rashes noted Neuro: grossly intact Psych: pleasant, appropriate   Laboratory: Recent Labs  Lab 08/17/17 1251  WBC 3.5*  HGB 12.6*  HCT 36.6*  PLT 54*   Recent Labs  Lab 08/17/17 1251  NA 142  K 3.9  CL 111  CO2 22  BUN 26*  CREATININE 1.01  CALCIUM 8.5*  GLUCOSE 95    trops neg  Imaging/Diagnostic Tests: Dg Chest Port 1 View  Result Date: 08/17/2017 CLINICAL DATA:  Pt arrives via EMS from [place] [facility] where pt was being seen for seizure hx. Staff noted patient was not feeling well, lightheaded, dizzy sob and weakness. Bp 80/50Hx of CHF, heart attack, hep B, hep C HTN. Current smoker. EXAM: PORTABLE CHEST 1 VIEW COMPARISON:  04/15/2016 FINDINGS: Cardiac silhouette is normal in size. No mediastinal or hilar masses. No convincing adenopathy. Clear lungs.  No pleural effusion or pneumothorax. No acute skeletal abnormality. IMPRESSION: No active disease. Electronically Signed   By: Lajean Manes M.D.   On: 08/17/2017 13:00     Sherene Sires, DO 08/18/2017, 6:30 AM PGY-1, [place] Intern pager: [phone], text pages welcome

## 2017-08-21 ENCOUNTER — Other Ambulatory Visit: Payer: Self-pay | Admitting: Pharmacist

## 2017-08-21 NOTE — Patient Outreach (Signed)
[facility]) Care Management  08/21/2017  OMERO KOWAL 1956-05-22 [id number]   [facility] CM Pharmacist received referral from [facility] that patient and his sister had questions regarding medication changes post-hospitalization.   Successful phone outreach to patient, HIPAA details verified.  He reports he did not have any questions and would "leave it like it is right now."   He provided verbal permission to call his sister Claiborne Billings, whom is listed on [facility] Consent form from 04/2016 in this chart.   Call to [facility], she verified HIPAA.  She reports they are concerned with having quetiapine discontinued by hospital.  She reports patient sees [facility] for his psychiatry needs and has not been able to reach [facility] regarding his medication changes.   Counseled they should follow directions provided by medical prescribers with regards to his medications.  She reports he was not told why quetiapine was discontinued---discussed per review of hospital discharge summary, appears to have been due to QT prolongation, which may be related to quetiapine.    Claiborne Billings was encouraged for her or patient to contact [facility] for an appointment as she reports his next appointment with [facility] isn't until mid-April.    [facility] Pharmacist attempted to get appointment scheduled at [facility] but when [facility] was called, they reported patient or his authorized agent will need to contact them for appointment.  Called back patient's sister, and provided with phone number for [facility] and suggested she call this afternoon as [facility] Rhonda W answered call when [facility] Pharmacist call.    Claiborne Billings verbalized understanding of need to follow-up with patient's prescriber regarding his hospital discharge medications changes.   She denies other needs and has phone number should new pharmacy related needs arise.   Plan:  Case not opened.    Patient and his sister were counseled to follow directions of his medical  prescribers and to follow-up with [facility] regarding a sooner appointment.   PCP updated.    Karrie Meres, PharmD, [place] [phone]

## 2017-08-24 ENCOUNTER — Other Ambulatory Visit: Payer: Self-pay | Admitting: *Deleted

## 2017-08-27 ENCOUNTER — Inpatient Hospital Stay: Payer: Medicare Other | Admitting: Physician Assistant

## 2017-09-02 ENCOUNTER — Inpatient Hospital Stay: Payer: Medicare Other | Admitting: Physician Assistant

## 2017-11-10 ENCOUNTER — Other Ambulatory Visit: Payer: Self-pay | Admitting: Physician Assistant

## 2017-11-10 DIAGNOSIS — F5105 Insomnia due to other mental disorder: Principal | ICD-10-CM

## 2017-11-10 DIAGNOSIS — F418 Other specified anxiety disorders: Secondary | ICD-10-CM

## 2017-11-23 ENCOUNTER — Telehealth: Payer: Self-pay | Admitting: Physician Assistant

## 2017-11-23 DIAGNOSIS — F418 Other specified anxiety disorders: Secondary | ICD-10-CM

## 2017-11-23 DIAGNOSIS — F5105 Insomnia due to other mental disorder: Secondary | ICD-10-CM

## 2017-11-23 DIAGNOSIS — F331 Major depressive disorder, recurrent, moderate: Secondary | ICD-10-CM

## 2017-11-23 NOTE — Telephone Encounter (Signed)
Please advised last OV 04/13/17.

## 2017-11-25 NOTE — Telephone Encounter (Signed)
Pt called to f/u on status of refills. Pt is out of both medications for about a week. He is not able to sleep well (3 hours a night). Advised pt [name], PA has been out of the office.  Please advise.  [place], [facility]

## 2017-11-26 NOTE — Telephone Encounter (Signed)
Chart review: Remeron discontinued at hospital discharge in March, not on current medication list. Remeron originally rx'ed 10/20/16 for depression and insomnia. Belsomra added 01/30/17. Insomnia and depression not noted in office visit notes since those dates. No upcoming appointments, patient cancelled hospital follow up visit that was scheduled for 09/12/17.   Note routed to provider to refill medications and to scheduling pool as patient likely needs office visit to follow up.

## 2017-11-27 NOTE — Telephone Encounter (Signed)
Please schedule

## 2017-11-27 NOTE — Telephone Encounter (Signed)
He really need to come in.

## 2017-11-30 ENCOUNTER — Telehealth: Payer: Self-pay | Admitting: Physician Assistant

## 2017-11-30 NOTE — Telephone Encounter (Signed)
Copied from [place] [id number]. Topic: Quick Communication - Rx Refill/Question >> Nov 30, 2017  9:16 AM Lysle Morales L, NT wrote: Medication:  Suvorexant (BELSOMRA) 15 MG TABS and also mirtazapine ,Remeron 15 mg  Has the patient contacted their pharmacy? yes  (Agent: If no, request that the patient contact the pharmacy for the refill. (Agent: If yes, when and what did the pharmacy advise?said both were expired   Preferred Pharmacy (with phone number or street name): [place], [place] - [place] [phone] (Phone) [phone]      Agent: Please be advised that RX refills may take up to 3 business days. We ask that you follow-up with your pharmacy.  >> Nov 30, 2017  9:29 AM Cecelia Byars, NT wrote: Patient has an appointment in 12/04/17 at 230 pm with  [name]

## 2017-11-30 NOTE — Telephone Encounter (Signed)
Copied from [place] [id number]. Topic: Quick Communication - Rx Refill/Question >> Nov 30, 2017  9:16 AM Lysle Morales L, NT wrote: Medication:  Suvorexant (BELSOMRA) 15 MG TABS and also mirtazapine ,Remeron 15 mg  Has the patient contacted their pharmacy? yes  (Agent: If no, request that the patient contact the pharmacy for the refill. (Agent: If yes, when and what did the pharmacy advise?said both were expired   Preferred Pharmacy (with phone number or street name): [place], [place] - [place] [phone] (Phone) [phone]      Agent: Please be advised that RX refills may take up to 3 business days. We ask that you follow-up with your pharmacy.

## 2017-11-30 NOTE — Telephone Encounter (Signed)
Copied from [place] [id number]. Topic: Quick Communication - Rx Refill/Question >> Nov 30, 2017  9:16 AM Lysle Morales L, NT wrote: Medication:  Suvorexant (BELSOMRA) 15 MG TABS and also mirtazapine ,Remeron 15 mg  Has the patient contacted their pharmacy? yes  (Agent: If no, request that the patient contact the pharmacy for the refill. (Agent: If yes, when and what did the pharmacy advise?said both were expired   Preferred Pharmacy (with phone number or street name): [place], [place] - [place] [phone] (Phone) [phone]      Agent: Please be advised that RX refills may take up to 3 business days. We ask that you follow-up with your pharmacy.

## 2017-12-03 ENCOUNTER — Ambulatory Visit: Payer: Medicare Other | Admitting: Physician Assistant

## 2017-12-04 ENCOUNTER — Ambulatory Visit: Payer: Medicare Other | Admitting: Physician Assistant

## 2017-12-04 ENCOUNTER — Encounter: Payer: Self-pay | Admitting: Physician Assistant

## 2017-12-04 ENCOUNTER — Ambulatory Visit (INDEPENDENT_AMBULATORY_CARE_PROVIDER_SITE_OTHER): Payer: Medicare Other | Admitting: Physician Assistant

## 2017-12-04 ENCOUNTER — Other Ambulatory Visit: Payer: Self-pay

## 2017-12-04 VITALS — BP 113/69 | HR 63 | Temp 98.4°F | Resp 17 | Ht 68.0 in | Wt 216.4 lb

## 2017-12-04 DIAGNOSIS — Z09 Encounter for follow-up examination after completed treatment for conditions other than malignant neoplasm: Secondary | ICD-10-CM

## 2017-12-04 DIAGNOSIS — R42 Dizziness and giddiness: Secondary | ICD-10-CM | POA: Diagnosis not present

## 2017-12-04 DIAGNOSIS — F5104 Psychophysiologic insomnia: Secondary | ICD-10-CM

## 2017-12-04 DIAGNOSIS — I10 Essential (primary) hypertension: Secondary | ICD-10-CM

## 2017-12-04 MED ORDER — SUVOREXANT 10 MG PO TABS: 10.0000 mg | ORAL_TABLET | Freq: Every day | ORAL | 5 refills | Status: DC

## 2017-12-04 MED ORDER — METOPROLOL TARTRATE 25 MG PO TABS: 12.5000 mg | ORAL_TABLET | Freq: Every day | ORAL | 3 refills | Status: DC

## 2017-12-04 NOTE — Patient Instructions (Signed)
     IF you received an x-ray today, you will receive an invoice from [facility]. Please contact [facility] at [phone] with questions or concerns regarding your invoice.   IF you received labwork today, you will receive an invoice from [facility]. Please contact [facility] at [phone] with questions or concerns regarding your invoice.   Our billing staff will not be able to assist you with questions regarding bills from these companies.  You will be contacted with the lab results as soon as they are available. The fastest way to get your results is to activate your My Chart account. Instructions are located on the last page of this paperwork. If you have not heard from us regarding the results in 2 weeks, please contact this office.     
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
## 2017-12-05 ENCOUNTER — Telehealth: Payer: Self-pay | Admitting: Physician Assistant

## 2017-12-05 LAB — CBC
Hematocrit: 43.8 % (ref 37.5–51.0)
Hemoglobin: 14.7 g/dL (ref 13.0–17.7)
MCH: 33.3 pg — ABNORMAL HIGH (ref 26.6–33.0)
MCHC: 33.6 g/dL (ref 31.5–35.7)
MCV: 99 fL — ABNORMAL HIGH (ref 79–97)
Platelets: 69 10*3/uL — CL (ref 150–450)
RBC: 4.42 x10E6/uL (ref 4.14–5.80)
RDW: 14.3 % (ref 12.3–15.4)
WBC: 5.4 10*3/uL (ref 3.4–10.8)

## 2017-12-05 LAB — CMP AND LIVER
ALT: 38 IU/L (ref 0–44)
AST: 55 IU/L — ABNORMAL HIGH (ref 0–40)
Albumin: 4.4 g/dL (ref 3.6–4.8)
Alkaline Phosphatase: 53 IU/L (ref 39–117)
BUN: 22 mg/dL (ref 8–27)
Bilirubin Total: 0.5 mg/dL (ref 0.0–1.2)
Bilirubin, Direct: 0.2 mg/dL (ref 0.00–0.40)
CO2: 23 mmol/L (ref 20–29)
Calcium: 9.2 mg/dL (ref 8.6–10.2)
Chloride: 103 mmol/L (ref 96–106)
Creatinine, Ser: 0.98 mg/dL (ref 0.76–1.27)
GFR calc Af Amer: 94 mL/min/{1.73_m2} (ref >59)
GFR calc non Af Amer: 82 mL/min/{1.73_m2} (ref >59)
Glucose: 113 mg/dL — ABNORMAL HIGH (ref 65–99)
Potassium: 4.1 mmol/L (ref 3.5–5.2)
Sodium: 144 mmol/L (ref 134–144)
Total Protein: 6.6 g/dL (ref 6.0–8.5)

## 2017-12-05 LAB — MAGNESIUM: Magnesium: 2.3 mg/dL (ref 1.6–2.3)

## 2017-12-05 NOTE — Telephone Encounter (Signed)
Pt. Called inquiring as to why one of his two sleeping medications had not been refilled.   Pt. Could not name medication and asserted he handed Mr. [name] some copy of the medication label but did not receive it back.   Pt. Did not seem irritated and did not request additional medication be refilled.

## 2017-12-05 NOTE — Telephone Encounter (Signed)
Pt message why has sleep med not been refilled.  Suvorexant/BELSOMRA was refilled 12/04/2017 @ [facility] - only pharm listed

## 2017-12-07 ENCOUNTER — Telehealth: Payer: Self-pay | Admitting: Primary Care

## 2017-12-07 NOTE — Telephone Encounter (Addendum)
This lab result appears to have been seen by [name] PA. Will send this note to [facility] primary Care.

## 2017-12-07 NOTE — Telephone Encounter (Signed)
Phone call rec'd from [employer] with alert value: 12/04/17 Platelets 69,000.  Will forward to Allie Bossier at [facility] office.   Sent Skype message to [employer] of alert value being sent via phone note.

## 2017-12-07 NOTE — Telephone Encounter (Signed)
Error in forwarding the note re: low Platelet ct.; sent to [facility] @ [facility] to K. Clark, in error.  Resending message to correct office for [name] to review.  Spoke with Hassan Rowan at [facility], and made aware this is being forwarded for his review.  Agreed with plan.

## 2018-01-13 ENCOUNTER — Ambulatory Visit: Payer: Medicare Other | Admitting: Physician Assistant

## 2018-02-03 ENCOUNTER — Other Ambulatory Visit: Payer: Self-pay | Admitting: Physician Assistant

## 2018-02-03 NOTE — Telephone Encounter (Signed)
Proair refill Last Refill:08/04/16 (expired) Last OV: 12/04/17 PCP: [facility]: [place], [place] [phone] (Phone) [phone] (Fax)

## 2018-05-04 DIAGNOSIS — E7800 Pure hypercholesterolemia, unspecified: Secondary | ICD-10-CM | POA: Diagnosis not present

## 2018-05-04 DIAGNOSIS — I2510 Atherosclerotic heart disease of native coronary artery without angina pectoris: Secondary | ICD-10-CM | POA: Diagnosis not present

## 2018-05-04 DIAGNOSIS — I5032 Chronic diastolic (congestive) heart failure: Secondary | ICD-10-CM | POA: Diagnosis not present

## 2018-05-04 DIAGNOSIS — I10 Essential (primary) hypertension: Secondary | ICD-10-CM | POA: Diagnosis not present

## 2018-05-05 NOTE — Telephone Encounter (Signed)
Dr Nolon Rod can we close this out?

## 2018-05-25 ENCOUNTER — Other Ambulatory Visit: Payer: Self-pay | Admitting: Physician Assistant

## 2018-05-25 DIAGNOSIS — F5104 Psychophysiologic insomnia: Secondary | ICD-10-CM

## 2018-05-31 ENCOUNTER — Other Ambulatory Visit: Payer: Self-pay

## 2018-05-31 DIAGNOSIS — G40409 Other generalized epilepsy and epileptic syndromes, not intractable, without status epilepticus: Secondary | ICD-10-CM

## 2018-05-31 MED ORDER — DIVALPROEX SODIUM 500 MG PO DR TAB: DELAYED_RELEASE_TABLET | ORAL | 3 refills | Status: DC

## 2018-07-23 ENCOUNTER — Other Ambulatory Visit: Payer: Self-pay | Admitting: Physician Assistant

## 2018-07-23 DIAGNOSIS — F5104 Psychophysiologic insomnia: Secondary | ICD-10-CM

## 2018-07-27 NOTE — Telephone Encounter (Signed)
Please advise on refill.   Former [name] pt and he has not been seen since 12/04/17.

## 2018-07-28 NOTE — Telephone Encounter (Signed)
Meds denied. Please schedule patient to establish care as patient's previous pcp in longer with this practice. thanks
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
## 2018-08-04 ENCOUNTER — Ambulatory Visit (INDEPENDENT_AMBULATORY_CARE_PROVIDER_SITE_OTHER): Payer: Medicare Other | Admitting: Adult Health

## 2018-08-04 ENCOUNTER — Encounter: Payer: Self-pay | Admitting: Adult Health

## 2018-08-04 VITALS — BP 89/55 | HR 55 | Temp 98.1°F | Ht 68.0 in | Wt 197.9 lb

## 2018-08-04 DIAGNOSIS — I252 Old myocardial infarction: Secondary | ICD-10-CM

## 2018-08-04 DIAGNOSIS — Z122 Encounter for screening for malignant neoplasm of respiratory organs: Secondary | ICD-10-CM

## 2018-08-04 DIAGNOSIS — D61818 Other pancytopenia: Secondary | ICD-10-CM

## 2018-08-04 DIAGNOSIS — K7460 Unspecified cirrhosis of liver: Secondary | ICD-10-CM | POA: Diagnosis not present

## 2018-08-04 DIAGNOSIS — G40909 Epilepsy, unspecified, not intractable, without status epilepticus: Secondary | ICD-10-CM

## 2018-08-04 DIAGNOSIS — I10 Essential (primary) hypertension: Secondary | ICD-10-CM

## 2018-08-04 DIAGNOSIS — Z23 Encounter for immunization: Secondary | ICD-10-CM | POA: Diagnosis not present

## 2018-08-04 DIAGNOSIS — R001 Bradycardia, unspecified: Secondary | ICD-10-CM

## 2018-08-04 DIAGNOSIS — Z136 Encounter for screening for cardiovascular disorders: Secondary | ICD-10-CM | POA: Diagnosis not present

## 2018-08-04 DIAGNOSIS — Z79899 Other long term (current) drug therapy: Secondary | ICD-10-CM | POA: Insufficient documentation

## 2018-08-04 DIAGNOSIS — F3170 Bipolar disorder, currently in remission, most recent episode unspecified: Secondary | ICD-10-CM

## 2018-08-04 DIAGNOSIS — Z0000 Encounter for general adult medical examination without abnormal findings: Secondary | ICD-10-CM | POA: Insufficient documentation

## 2018-08-04 MED ORDER — ATORVASTATIN CALCIUM 10 MG PO TABS: 10.0000 mg | ORAL_TABLET | Freq: Every day | ORAL | 1 refills | Status: DC

## 2018-08-04 MED ORDER — LISINOPRIL 5 MG PO TABS: 5.0000 mg | ORAL_TABLET | Freq: Every day | ORAL | 0 refills | Status: DC

## 2018-08-04 MED ORDER — METOPROLOL SUCCINATE ER 25 MG PO TB24: 12.5000 mg | ORAL_TABLET | Freq: Every day | ORAL | 0 refills | Status: DC

## 2018-08-04 MED ORDER — ZOSTER VAC RECOMB ADJUVANTED 50 MCG/0.5ML IM SUSR: 0.5000 mL | Freq: Once | INTRAMUSCULAR | 0 refills | Status: AC

## 2018-08-04 NOTE — Patient Instructions (Addendum)
Hypotension As your heart beats, it forces blood through your body. This force is called blood pressure. If you have hypotension, you have low blood pressure. When your blood pressure is too low, you may not get enough blood to your brain or other parts of your body. This may cause you to feel weak, light-headed, have a fast heartbeat, or even pass out (faint). Low blood pressure may be harmless, or it may cause serious problems. What are the causes?  Blood loss.  Not enough water in the body (dehydration).  Heart problems.  Hormone problems.  Pregnancy.  A very bad infection.  Not having enough of certain nutrients.  Very bad allergic reactions.  Certain medicines. What increases the risk?  Age. The risk increases as you get older.  Conditions that affect the heart or the brain and spinal cord (central nervous system).  Taking certain medicines.  Being pregnant. What are the signs or symptoms?  Feeling: ? Weak. ? Light-headed. ? Dizzy. ? Tired (fatigued).  Blurred vision.  Fast heartbeat.  Passing out, in very bad cases. How is this treated?  Changing your diet. This may involve eating more salt (sodium) or drinking more water.  Taking medicines to raise your blood pressure.  Changing how much you take (the dosage) of some of your medicines.  Wearing compression stockings. These stockings help to prevent blood clots and reduce swelling in your legs. In some cases, you may need to go to the hospital for:  Fluid replacement. This means you will receive fluids through an IV tube.  Blood replacement. This means you will receive donated blood through an IV tube (transfusion).  Treating an infection or heart problems, if this applies.  Monitoring. You may need to be monitored while medicines that you are taking wear off. Follow these instructions at home: Eating and drinking   Drink enough fluids to keep your pee (urine) pale yellow.  Eat a healthy diet.  Follow instructions from your doctor about what you can eat or drink. A healthy diet includes: ? Fresh fruits and vegetables. ? Whole grains. ? Low-fat (lean) meats. ? Low-fat dairy products.  Eat extra salt only as told. Do not add extra salt to your diet unless your doctor tells you to.  Eat small meals often.  Avoid standing up quickly after you eat. Medicines  Take over-the-counter and prescription medicines only as told by your doctor. ? Follow instructions from your doctor about changing how much you take of your medicines, if this applies. ? Do not stop or change any of your medicines on your own. General instructions   Wear compression stockings as told by your doctor.  Get up slowly from lying down or sitting.  Avoid hot showers and a lot of heat as told by your doctor.  Return to your normal activities as told by your doctor. Ask what activities are safe for you.  Do not use any products that contain nicotine or tobacco, such as cigarettes, e-cigarettes, and chewing tobacco. If you need help quitting, ask your doctor.  Keep all follow-up visits as told by your doctor. This is important. Contact a doctor if:  You throw up (vomit).  You have watery poop (diarrhea).  You have a fever for more than 2-3 days.  You feel more thirsty than normal.  You feel weak and tired. Get help right away if:  You have chest pain.  You have a fast or uneven heartbeat.  You lose feeling (have numbness) in any   part of your body.  You cannot move your arms or your legs.  You have trouble talking.  You get sweaty or feel light-headed.  You pass out.  You have trouble breathing.  You have trouble staying awake.  You feel mixed up (confused). Summary  Hypotension is also called low blood pressure. It is when the force of blood pumping through your arteries is too weak.  Hypotension may be harmless, or it may cause serious problems.  Treatment may include changing  your diet and medicines, and wearing compression stockings.  In very bad cases, you may need to go to the hospital. This information is not intended to replace advice given to you by your health care provider. Make sure you discuss any questions you have with your health care provider. Document Released: 08/27/2009 Document Revised: 11/26/2017 Document Reviewed: 11/26/2017 Elsevier Interactive Patient Education  2019 [place] all medications as directed, with the two following changes- 1) Metoprolol ER 25mg  - now only take 1/2 tablet, so take 12.5mg  once daily 2) Stop Lisinopril 10mg , start Lisinopril 5mg  once daily Please check your blood pressure and heart rate once daily and record. Increase fluids, especially plain water. Follow-up in 2 weeks, bring blood pressure log. Please schedule Neurology follow-up ASAP [facility] [address], [place], [place] [address] [phone] Please continue with Psychiatrist, Neurologist, Cardiologist, as directed. WELCOME TO THE PRACTICE

## 2018-08-04 NOTE — Assessment & Plan Note (Signed)
Continue all medications as directed, with the two following changes- 1) Metoprolol ER 25mg  - now only take 1/2 tablet, so take 12.5mg  once daily 2) Stop Lisinopril 10mg , start Lisinopril 5mg  once daily Please check your blood pressure and heart rate once daily and record. Increase fluids, especially plain water. Follow-up in 2 weeks, bring blood pressure log. Please schedule Neurology follow-up ASAP [facility] [address], [place], [place] [address] [phone] Please continue with Psychiatrist, Neurologist, Cardiologist, as directed.

## 2018-08-04 NOTE — Assessment & Plan Note (Signed)
02/2017- evaluated by Hematology/Oncology/Dr. Irene Limbo for thrombocytopenia- His previously had imaging including a CT scan and ultrasound of the abdomen that has shown a nodular liver consistent with liver cirrhosis. CT scan also showed some mild splenomegaly. Low platelets appears to be multifactorial but primarily due to liver cirrhosis and splenomegaly (with likely hypersplenism- given the time course) Additional could be from his HepC and Hep B. He has now completed Harvoni for Hep C. I also recommended he hold Meloxicam for now and avoid all NSAIDS. -would need [place] abd and AFP tumor marker q75months with PCP to monitor for [facility] -if platelet counts <30k or if bleeding issues arise might need to consider treatment of hep C/cirrhosis related thrombocytopenia with promacta or Nplate. -counseled on absolute avoidance of ETOH, NSAIDS -reasonable to take B complex daily empirically. -would need to be careful with use of multiple psychotropics - will let PCP determine risk vs benefits of these  He was advised to f/u with Dr. Irene Limbo in 1 month, which would have been 03/2017

## 2018-08-04 NOTE — Assessment & Plan Note (Signed)
Needs to be seen by Neurology- contact information provided No seizure activity >1 year Well controlled on Depakote 500mg  TID

## 2018-08-04 NOTE — Assessment & Plan Note (Signed)
Followed by Dr. Binnie Kand BB reduced today due to hypotension, bradycardia

## 2018-08-04 NOTE — Assessment & Plan Note (Signed)
1) Metoprolol ER 25mg  - now only take 1/2 tablet, so take 12.5mg  once daily 2) Stop Lisinopril 10mg , start Lisinopril 5mg  once daily Please check your blood pressure and heart rate once daily and record. Increase fluids, especially plain water. Follow-up in 2 weeks, bring blood pressure log.

## 2018-08-04 NOTE — Assessment & Plan Note (Signed)
He is followed by Dr. Josph Macho Am[facility]astro/Psychiatry every 4 months He denies current SI/HI Continue current medication regime Continue to avoid ETOH/illicit drugs

## 2018-08-11 ENCOUNTER — Other Ambulatory Visit: Payer: Self-pay

## 2018-08-11 DIAGNOSIS — F5104 Psychophysiologic insomnia: Secondary | ICD-10-CM

## 2018-08-11 NOTE — Telephone Encounter (Signed)
We have not prescribed these medications for the patient previously.  Please review and refill if appropriate.  T. Chrishana Spargur, CMA  

## 2018-08-12 MED ORDER — SUVOREXANT 10 MG PO TABS: 10.0000 mg | ORAL_TABLET | Freq: Every day | ORAL | 0 refills | Status: DC
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
## 2018-08-18 ENCOUNTER — Ambulatory Visit: Payer: Medicare Other

## 2018-08-18 ENCOUNTER — Other Ambulatory Visit: Payer: Self-pay | Admitting: Adult Health

## 2018-08-18 ENCOUNTER — Encounter: Payer: Self-pay | Admitting: Adult Health

## 2018-08-18 ENCOUNTER — Ambulatory Visit (INDEPENDENT_AMBULATORY_CARE_PROVIDER_SITE_OTHER): Payer: Medicare Other | Admitting: Adult Health

## 2018-08-18 ENCOUNTER — Ambulatory Visit
Admission: RE | Admit: 2018-08-18 | Discharge: 2018-08-18 | Disposition: A | Discharge status: Home / Self Care | Payer: Medicare Other | Source: Ambulatory Visit | Attending: Adult Health | Admitting: Adult Health

## 2018-08-18 VITALS — BP 109/70 | HR 85 | Temp 98.3°F | Ht 68.0 in | Wt 207.6 lb

## 2018-08-18 DIAGNOSIS — F17200 Nicotine dependence, unspecified, uncomplicated: Secondary | ICD-10-CM

## 2018-08-18 DIAGNOSIS — J439 Emphysema, unspecified: Secondary | ICD-10-CM | POA: Diagnosis not present

## 2018-08-18 DIAGNOSIS — I159 Secondary hypertension, unspecified: Secondary | ICD-10-CM | POA: Diagnosis not present

## 2018-08-18 DIAGNOSIS — F3170 Bipolar disorder, currently in remission, most recent episode unspecified: Secondary | ICD-10-CM

## 2018-08-18 DIAGNOSIS — D61818 Other pancytopenia: Secondary | ICD-10-CM | POA: Diagnosis not present

## 2018-08-18 DIAGNOSIS — I10 Essential (primary) hypertension: Secondary | ICD-10-CM | POA: Insufficient documentation

## 2018-08-18 DIAGNOSIS — Z23 Encounter for immunization: Secondary | ICD-10-CM

## 2018-08-18 DIAGNOSIS — Z122 Encounter for screening for malignant neoplasm of respiratory organs: Secondary | ICD-10-CM

## 2018-08-18 MED ORDER — HYDROXYZINE HCL 10 MG PO TABS: 10.0000 mg | ORAL_TABLET | Freq: Three times a day (TID) | ORAL | 3 refills | Status: DC | PRN

## 2018-08-18 MED ORDER — LISINOPRIL 5 MG PO TABS: 5.0000 mg | ORAL_TABLET | Freq: Every day | ORAL | 3 refills | Status: DC

## 2018-08-18 MED ORDER — METOPROLOL SUCCINATE ER 25 MG PO TB24: 12.5000 mg | ORAL_TABLET | Freq: Every day | ORAL | 3 refills | Status: DC

## 2018-08-18 NOTE — Assessment & Plan Note (Signed)
Need labs

## 2018-08-18 NOTE — Patient Instructions (Addendum)
Managing Your Hypertension Hypertension is commonly called high blood pressure. This is when the force of your blood pressing against the walls of your arteries is too strong. Arteries are blood vessels that carry blood from your heart throughout your body. Hypertension forces the heart to work harder to pump blood, and may cause the arteries to become narrow or stiff. Having untreated or uncontrolled hypertension can cause heart attack, stroke, kidney disease, and other problems. What are blood pressure readings? A blood pressure reading consists of a higher number over a lower number. Ideally, your blood pressure should be below 120/80. The first ("top") number is called the systolic pressure. It is a measure of the pressure in your arteries as your heart beats. The second ("bottom") number is called the diastolic pressure. It is a measure of the pressure in your arteries as the heart relaxes. What does my blood pressure reading mean? Blood pressure is classified into four stages. Based on your blood pressure reading, your health care provider may use the following stages to determine what type of treatment you need, if any. Systolic pressure and diastolic pressure are measured in a unit called mm Hg. Normal  Systolic pressure: below 120.  Diastolic pressure: below 80. Elevated  Systolic pressure: 120-129.  Diastolic pressure: below 80. Hypertension stage 1  Systolic pressure: 130-139.  Diastolic pressure: 80-89. Hypertension stage 2  Systolic pressure: 140 or above.  Diastolic pressure: 90 or above. What health risks are associated with hypertension? Managing your hypertension is an important responsibility. Uncontrolled hypertension can lead to:  A heart attack.  A stroke.  A weakened blood vessel (aneurysm).  Heart failure.  Kidney damage.  Eye damage.  Metabolic syndrome.  Memory and concentration problems. What changes can I make to manage my  hypertension? Hypertension can be managed by making lifestyle changes and possibly by taking medicines. Your health care provider will help you make a plan to bring your blood pressure within a normal range. Eating and drinking   Eat a diet that is high in fiber and potassium, and low in salt (sodium), added sugar, and fat. An example eating plan is called the DASH (Dietary Approaches to Stop Hypertension) diet. To eat this way: ? Eat plenty of fresh fruits and vegetables. Try to fill half of your plate at each meal with fruits and vegetables. ? Eat whole grains, such as whole wheat pasta, brown rice, or whole grain bread. Fill about one quarter of your plate with whole grains. ? Eat low-fat diary products. ? Avoid fatty cuts of meat, processed or cured meats, and poultry with skin. Fill about one quarter of your plate with lean proteins such as fish, chicken without skin, beans, eggs, and tofu. ? Avoid premade and processed foods. These tend to be higher in sodium, added sugar, and fat.  Reduce your daily sodium intake. Most people with hypertension should eat less than 1,500 mg of sodium a day.  Limit alcohol intake to no more than 1 drink a day for nonpregnant women and 2 drinks a day for men. One drink equals 12 oz of beer, 5 oz of wine, or 1 oz of hard liquor. Lifestyle  Work with your health care provider to maintain a healthy body weight, or to lose weight. Ask what an ideal weight is for you.  Get at least 30 minutes of exercise that causes your heart to beat faster (aerobic exercise) most days of the week. Activities may include walking, swimming, or biking.  Include exercise   to strengthen your muscles (resistance exercise), such as weight lifting, as part of your weekly exercise routine. Try to do these types of exercises for 30 minutes at least 3 days a week.  Do not use any products that contain nicotine or tobacco, such as cigarettes and e-cigarettes. If you need help quitting,  ask your health care provider.  Control any long-term (chronic) conditions you have, such as high cholesterol or diabetes. Monitoring  Monitor your blood pressure at home as told by your health care provider. Your personal target blood pressure may vary depending on your medical conditions, your age, and other factors.  Have your blood pressure checked regularly, as often as told by your health care provider. Working with your health care provider  Review all the medicines you take with your health care provider because there may be side effects or interactions.  Talk with your health care provider about your diet, exercise habits, and other lifestyle factors that may be contributing to hypertension.  Visit your health care provider regularly. Your health care provider can help you create and adjust your plan for managing hypertension. Will I need medicine to control my blood pressure? Your health care provider may prescribe medicine if lifestyle changes are not enough to get your blood pressure under control, and if:  Your systolic blood pressure is 130 or higher.  Your diastolic blood pressure is 80 or higher. Take medicines only as told by your health care provider. Follow the directions carefully. Blood pressure medicines must be taken as prescribed. The medicine does not work as well when you skip doses. Skipping doses also puts you at risk for problems. Contact a health care provider if:  You think you are having a reaction to medicines you have taken.  You have repeated (recurrent) headaches.  You feel dizzy.  You have swelling in your ankles.  You have trouble with your vision. Get help right away if:  You develop a severe headache or confusion.  You have unusual weakness or numbness, or you feel faint.  You have severe pain in your chest or abdomen.  You vomit repeatedly.  You have trouble breathing. Summary  Hypertension is when the force of blood pumping  through your arteries is too strong. If this condition is not controlled, it may put you at risk for serious complications.  Your personal target blood pressure may vary depending on your medical conditions, your age, and other factors. For most people, a normal blood pressure is less than 120/80.  Hypertension is managed by lifestyle changes, medicines, or both. Lifestyle changes include weight loss, eating a healthy, low-sodium diet, exercising more, and limiting alcohol. This information is not intended to replace advice given to you by your health care provider. Make sure you discuss any questions you have with your health care provider. Document Released: 02/25/2012 Document Revised: 04/30/2016 Document Reviewed: 04/30/2016 Elsevier Interactive Patient Education  2019 [place] blood pressure has increased and is much better than previous office visit. Continue current medication regime. Continue to check blood pressure and heart rate daily. Please call clinic if blood pressure if consistently <100/60, >140/90 or heart rate <60 or >100.  Please follow-up with [place] in 2 months for complete physical with fasting labs, so please make a morning appt. We will call you when imaging results are available. [facility] [address] [place], [place] [address] [phone]

## 2018-08-18 NOTE — Assessment & Plan Note (Signed)
BP much improved today- 109/70, HR 85 Home readings SBP 97-133, mean 110 DBP 62-73,mean low 70s Continue on current anti-hypertensive regime  Continue to check BP/HR at home

## 2018-08-18 NOTE — Assessment & Plan Note (Signed)
Reports close follow-up with Psychiatry, denies any recent periods of mania

## 2018-08-20 ENCOUNTER — Telehealth: Payer: Self-pay | Admitting: Adult Health

## 2018-08-20 NOTE — Telephone Encounter (Signed)
Patient called states he went to pick up Rx from pharmacy but was told they only had rcvd :  (Hydroxyzine)---- Per patient [facility] did  NOT received refill Rx on these (2) :   metoprolol succinate (TOPROL-XL) 25 MG 24 hr tablet [[id number]]   Order Details  Dose: 12.5 mg Route: Oral Frequency: Daily  Dispense Quantity: 30 tablet Refills: 3 Fills remaining: --        Sig: Take 0.5 tablets (12.5 mg total) by mouth daily.     &   Patient states was not given this one due to not time for refill :  atorvastatin (LIPITOR) 10 MG tablet [[id number]]   Order Details  Dose: 10 mg Route: Oral Frequency: Daily-1800  Dispense Quantity: 90 tablet Refills: 1 Fills remaining: --      ---Forwarding message to medical assistant for review with pharmacy for refill status/ clarification --advised pt  Someone would call him if questions & or to update him on  Rxs--- also advised pt Provider is out of office on Friday and it would be at least Monday before his issue would be reviewed.  --glh

## 2018-08-23 NOTE — Telephone Encounter (Signed)
Pt's VM box is full and cannot accept messages.  Verified with Gregery Na that pt has refill of metoprolol at file at the pharmacy as well as the fact that he was given 90 supply of atorvastatin in February and is too early to refill at this time.  Charyl Bigger, CMA

## 2018-09-02 ENCOUNTER — Other Ambulatory Visit: Payer: Self-pay | Admitting: Adult Health

## 2018-09-02 DIAGNOSIS — I10 Essential (primary) hypertension: Secondary | ICD-10-CM

## 2018-09-10 ENCOUNTER — Other Ambulatory Visit: Payer: Self-pay | Admitting: Adult Health

## 2018-09-10 DIAGNOSIS — F5104 Psychophysiologic insomnia: Secondary | ICD-10-CM

## 2018-09-13 NOTE — Telephone Encounter (Signed)
Do you wish to continue this medication?  Pt is to return for OV in June, 2020.  Charyl Bigger, CMA

## 2018-10-12 ENCOUNTER — Other Ambulatory Visit: Payer: Self-pay | Admitting: Adult Health

## 2018-10-12 DIAGNOSIS — F5104 Psychophysiologic insomnia: Secondary | ICD-10-CM

## 2018-10-12 NOTE — Telephone Encounter (Signed)
Please review and refill if appropriate.  T. Azlan Hanway, CMA  

## 2018-11-01 ENCOUNTER — Encounter: Payer: Self-pay | Admitting: Cardiology

## 2018-11-01 ENCOUNTER — Ambulatory Visit (INDEPENDENT_AMBULATORY_CARE_PROVIDER_SITE_OTHER): Payer: Medicare Other | Admitting: Cardiology

## 2018-11-01 ENCOUNTER — Other Ambulatory Visit: Payer: Self-pay

## 2018-11-01 VITALS — BP 119/65 | HR 63 | Ht 68.0 in | Wt 203.0 lb

## 2018-11-01 DIAGNOSIS — I10 Essential (primary) hypertension: Secondary | ICD-10-CM

## 2018-11-01 DIAGNOSIS — E7800 Pure hypercholesterolemia, unspecified: Secondary | ICD-10-CM | POA: Diagnosis not present

## 2018-11-01 DIAGNOSIS — R42 Dizziness and giddiness: Secondary | ICD-10-CM | POA: Diagnosis not present

## 2018-11-01 DIAGNOSIS — R0602 Shortness of breath: Secondary | ICD-10-CM | POA: Diagnosis not present

## 2018-11-01 DIAGNOSIS — Z720 Tobacco use: Secondary | ICD-10-CM

## 2018-11-01 DIAGNOSIS — I2510 Atherosclerotic heart disease of native coronary artery without angina pectoris: Secondary | ICD-10-CM | POA: Diagnosis not present

## 2018-11-01 DIAGNOSIS — D61818 Other pancytopenia: Secondary | ICD-10-CM
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
## 2018-11-02 ENCOUNTER — Encounter: Payer: Self-pay | Admitting: Cardiology

## 2018-11-10 ENCOUNTER — Other Ambulatory Visit: Payer: Self-pay | Admitting: Adult Health

## 2018-11-10 DIAGNOSIS — F5104 Psychophysiologic insomnia: Secondary | ICD-10-CM

## 2018-11-17 ENCOUNTER — Other Ambulatory Visit: Payer: Self-pay

## 2018-11-17 ENCOUNTER — Ambulatory Visit (INDEPENDENT_AMBULATORY_CARE_PROVIDER_SITE_OTHER): Payer: Medicare Other

## 2018-11-17 DIAGNOSIS — I2510 Atherosclerotic heart disease of native coronary artery without angina pectoris: Secondary | ICD-10-CM

## 2018-11-18 ENCOUNTER — Ambulatory Visit: Payer: Medicare Other | Admitting: Adult Health

## 2018-11-30 ENCOUNTER — Other Ambulatory Visit: Payer: Self-pay | Admitting: Adult Health

## 2018-12-01 ENCOUNTER — Ambulatory Visit: Payer: Medicare Other | Admitting: Cardiology

## 2018-12-06 ENCOUNTER — Encounter: Payer: Self-pay | Admitting: Cardiology

## 2018-12-06 ENCOUNTER — Other Ambulatory Visit: Payer: Self-pay

## 2018-12-06 ENCOUNTER — Ambulatory Visit (INDEPENDENT_AMBULATORY_CARE_PROVIDER_SITE_OTHER): Payer: Medicare Other | Admitting: Cardiology

## 2018-12-06 VITALS — BP 111/68 | HR 68 | Temp 98.4°F | Ht 68.0 in | Wt 208.9 lb

## 2018-12-06 DIAGNOSIS — I2510 Atherosclerotic heart disease of native coronary artery without angina pectoris: Secondary | ICD-10-CM

## 2018-12-06 DIAGNOSIS — R42 Dizziness and giddiness: Secondary | ICD-10-CM | POA: Diagnosis not present

## 2018-12-06 DIAGNOSIS — R0600 Dyspnea, unspecified: Secondary | ICD-10-CM

## 2018-12-06 DIAGNOSIS — R0609 Other forms of dyspnea: Secondary | ICD-10-CM | POA: Diagnosis not present

## 2018-12-06 DIAGNOSIS — R634 Abnormal weight loss: Secondary | ICD-10-CM

## 2018-12-06 DIAGNOSIS — I10 Essential (primary) hypertension: Secondary | ICD-10-CM | POA: Diagnosis not present
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
## 2018-12-09 ENCOUNTER — Other Ambulatory Visit: Payer: Self-pay | Admitting: Adult Health

## 2018-12-09 DIAGNOSIS — F5104 Psychophysiologic insomnia: Secondary | ICD-10-CM

## 2018-12-13 NOTE — Telephone Encounter (Signed)
Good Morning Tonya, Please have him schedule TeleMedicine OV Thanks! Valetta Fuller

## 2018-12-13 NOTE — Telephone Encounter (Signed)
Please call pt to schedule telemedicine visit.  Charyl Bigger, CMA

## 2018-12-13 NOTE — Telephone Encounter (Signed)
Please review and refill if appropriate.  T. Nelson, CMA  

## 2018-12-23 ENCOUNTER — Other Ambulatory Visit: Payer: Self-pay | Admitting: Adult Health

## 2018-12-23 DIAGNOSIS — I10 Essential (primary) hypertension: Secondary | ICD-10-CM

## 2019-01-19 ENCOUNTER — Other Ambulatory Visit: Payer: Medicare Other
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
## 2019-01-24 ENCOUNTER — Encounter: Payer: Self-pay | Admitting: Adult Health

## 2019-01-24 ENCOUNTER — Ambulatory Visit (INDEPENDENT_AMBULATORY_CARE_PROVIDER_SITE_OTHER): Payer: Medicare Other | Admitting: Adult Health

## 2019-01-24 ENCOUNTER — Other Ambulatory Visit: Payer: Self-pay

## 2019-01-24 DIAGNOSIS — F3170 Bipolar disorder, currently in remission, most recent episode unspecified: Secondary | ICD-10-CM

## 2019-01-24 DIAGNOSIS — F5104 Psychophysiologic insomnia: Secondary | ICD-10-CM

## 2019-01-24 DIAGNOSIS — I159 Secondary hypertension, unspecified: Secondary | ICD-10-CM

## 2019-01-24 DIAGNOSIS — G40909 Epilepsy, unspecified, not intractable, without status epilepticus: Secondary | ICD-10-CM

## 2019-01-24 DIAGNOSIS — Z0000 Encounter for general adult medical examination without abnormal findings: Secondary | ICD-10-CM

## 2019-01-24 DIAGNOSIS — Z79899 Other long term (current) drug therapy: Secondary | ICD-10-CM
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
## 2019-01-24 NOTE — Assessment & Plan Note (Signed)
Seroquel XR 300mg  QD NEEDS to f/u with psychaitrist  He denies SI/HI

## 2019-01-24 NOTE — Assessment & Plan Note (Signed)
Assessment and Plan: Continue all medications as directed. Increase water intake, aim for half of your body weight in ounces/day STOP all marijuana use, continue to abstain from ETOH use Follow-up with neuro ASAP Follow-up with Cards 05/2019 Follow-up with Psychiatry ASAP Change positions carefully, if ambulatory BP <100/60- call cardiology  Follow Up Instructions: Fasting lab appt 1-2 weeks- will NOT refill Rx until labs completed- pt verbalized understanding and agreement  In office f/u 2-3 weeks for CPE   I discussed the assessment and treatment plan with the patient. The patient was provided an opportunity to ask questions and all were answered. The patient agreed with the plan and demonstrated an understanding of the instructions.

## 2019-01-24 NOTE — Assessment & Plan Note (Signed)
Depakote 500mg  TID Needs to f/u with Neurology

## 2019-01-24 NOTE — Assessment & Plan Note (Signed)
He reports ambulatory BP  SBP 90-120s DBP 60-80s He denies CP/palpitations Currently taking Lisinopril 5mg  QD, Toprol XL 25mg  1/2 tab QD He denies dizziness with position changes

## 2019-01-26 ENCOUNTER — Encounter: Payer: Medicare Other | Admitting: Adult Health

## 2019-01-28 ENCOUNTER — Other Ambulatory Visit: Payer: Self-pay | Admitting: Adult Health

## 2019-02-01 ENCOUNTER — Other Ambulatory Visit: Payer: Self-pay | Admitting: Adult Health

## 2019-02-01 ENCOUNTER — Other Ambulatory Visit: Payer: Medicare Other

## 2019-02-19 ENCOUNTER — Other Ambulatory Visit: Payer: Self-pay | Admitting: Adult Health

## 2019-02-19 DIAGNOSIS — I10 Essential (primary) hypertension: Secondary | ICD-10-CM

## 2019-03-01 ENCOUNTER — Encounter: Payer: Medicare Other | Admitting: Adult Health

## 2019-03-14 ENCOUNTER — Other Ambulatory Visit: Payer: Self-pay | Admitting: Adult Health

## 2019-03-14 DIAGNOSIS — I10 Essential (primary) hypertension: Secondary | ICD-10-CM

## 2019-03-23 ENCOUNTER — Other Ambulatory Visit: Payer: Self-pay | Admitting: Adult Health

## 2019-03-23 DIAGNOSIS — I10 Essential (primary) hypertension: Secondary | ICD-10-CM

## 2019-04-13 ENCOUNTER — Other Ambulatory Visit: Payer: Self-pay

## 2019-04-13 ENCOUNTER — Other Ambulatory Visit: Payer: Medicare HMO

## 2019-04-13 ENCOUNTER — Telehealth: Payer: Self-pay | Admitting: Adult Health

## 2019-04-13 DIAGNOSIS — Z0000 Encounter for general adult medical examination without abnormal findings: Secondary | ICD-10-CM

## 2019-04-13 NOTE — Telephone Encounter (Signed)
Per last office note with you he was told we would not refill any medication until labs were done. Patient had labs drawn this am but these are not resulted. Please advise. AS, CMA

## 2019-04-13 NOTE — Telephone Encounter (Signed)
Patient came in for labs prior to wellness exam and on his way out asked I send a refill request for an inhaler and his Belsomra. If approved please send to [facility].

## 2019-04-14 LAB — CBC WITH DIFFERENTIAL/PLATELET
Basophils Absolute: 0 10*3/uL (ref 0.0–0.2)
Basos: 1 %
EOS (ABSOLUTE): 0.1 10*3/uL (ref 0.0–0.4)
Eos: 3 %
Hematocrit: 39.5 % (ref 37.5–51.0)
Hemoglobin: 13.4 g/dL (ref 13.0–17.7)
Immature Grans (Abs): 0 10*3/uL (ref 0.0–0.1)
Immature Granulocytes: 0 %
Lymphocytes Absolute: 0.8 10*3/uL (ref 0.7–3.1)
Lymphs: 31 %
MCH: 32.8 pg (ref 26.6–33.0)
MCHC: 33.9 g/dL (ref 31.5–35.7)
MCV: 97 fL (ref 79–97)
Monocytes Absolute: 0.3 10*3/uL (ref 0.1–0.9)
Monocytes: 13 %
Neutrophils Absolute: 1.4 10*3/uL (ref 1.4–7.0)
Neutrophils: 52 %
Platelets: 69 10*3/uL — CL (ref 150–450)
RBC: 4.09 x10E6/uL — ABNORMAL LOW (ref 4.14–5.80)
RDW: 14.8 % (ref 11.6–15.4)
WBC: 2.7 10*3/uL — ABNORMAL LOW (ref 3.4–10.8)

## 2019-04-14 LAB — COMPREHENSIVE METABOLIC PANEL
ALT: 31 IU/L (ref 0–44)
AST: 32 IU/L (ref 0–40)
Albumin/Globulin Ratio: 2 (ref 1.2–2.2)
Albumin: 4.1 g/dL (ref 3.8–4.8)
Alkaline Phosphatase: 50 IU/L (ref 39–117)
BUN/Creatinine Ratio: 10 (ref 10–24)
BUN: 9 mg/dL (ref 8–27)
Bilirubin Total: 0.5 mg/dL (ref 0.0–1.2)
CO2: 24 mmol/L (ref 20–29)
Calcium: 9.2 mg/dL (ref 8.6–10.2)
Chloride: 106 mmol/L (ref 96–106)
Creatinine, Ser: 0.87 mg/dL (ref 0.76–1.27)
GFR calc Af Amer: 105 mL/min/{1.73_m2} (ref >59)
GFR calc non Af Amer: 91 mL/min/{1.73_m2} (ref >59)
Globulin, Total: 2.1 g/dL (ref 1.5–4.5)
Glucose: 81 mg/dL (ref 65–99)
Potassium: 3.9 mmol/L (ref 3.5–5.2)
Sodium: 144 mmol/L (ref 134–144)
Total Protein: 6.2 g/dL (ref 6.0–8.5)

## 2019-04-14 LAB — HEMOGLOBIN A1C
Est. average glucose Bld gHb Est-mCnc: 105 mg/dL
Hgb A1c MFr Bld: 5.3 % (ref 4.8–5.6)

## 2019-04-14 LAB — LIPID PANEL
Chol/HDL Ratio: 4.1 ratio (ref 0.0–5.0)
Cholesterol, Total: 143 mg/dL (ref 100–199)
HDL: 35 mg/dL — ABNORMAL LOW (ref >39)
LDL Chol Calc (NIH): 88 mg/dL (ref 0–99)
Triglycerides: 107 mg/dL (ref 0–149)
VLDL Cholesterol Cal: 20 mg/dL (ref 5–40)

## 2019-04-14 LAB — TSH: TSH: 3.87 u[IU]/mL (ref 0.450–4.500)

## 2019-04-15 ENCOUNTER — Other Ambulatory Visit: Payer: Self-pay | Admitting: Adult Health

## 2019-04-15 DIAGNOSIS — F5104 Psychophysiologic insomnia: Secondary | ICD-10-CM

## 2019-04-15 MED ORDER — ALBUTEROL SULFATE HFA 108 (90 BASE) MCG/ACT IN AERS: INHALATION_SPRAY | RESPIRATORY_TRACT | 2 refills | Status: DC

## 2019-04-15 NOTE — Telephone Encounter (Signed)
I sent in inhaler, will send Belsomra Monday- unable to send in controlled from home. Sincerely, Valetta Fuller

## 2019-04-20 ENCOUNTER — Ambulatory Visit (INDEPENDENT_AMBULATORY_CARE_PROVIDER_SITE_OTHER): Payer: Medicare HMO | Admitting: Adult Health

## 2019-04-20 ENCOUNTER — Other Ambulatory Visit: Payer: Self-pay

## 2019-04-20 ENCOUNTER — Encounter: Payer: Self-pay | Admitting: Adult Health

## 2019-04-20 VITALS — BP 109/68 | HR 72 | Ht 68.0 in | Wt 215.8 lb

## 2019-04-20 DIAGNOSIS — F5104 Psychophysiologic insomnia: Secondary | ICD-10-CM

## 2019-04-20 DIAGNOSIS — Z0001 Encounter for general adult medical examination with abnormal findings: Secondary | ICD-10-CM | POA: Diagnosis not present

## 2019-04-20 DIAGNOSIS — Z0000 Encounter for general adult medical examination without abnormal findings: Secondary | ICD-10-CM

## 2019-04-20 MED ORDER — BELSOMRA 10 MG PO TABS: 1.0000 | ORAL_TABLET | Freq: Every day | ORAL | 0 refills | Status: DC
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
## 2019-04-30 ENCOUNTER — Other Ambulatory Visit: Payer: Self-pay | Admitting: Adult Health

## 2019-05-04 ENCOUNTER — Encounter: Payer: Self-pay | Admitting: Adult Health

## 2019-05-04 ENCOUNTER — Other Ambulatory Visit: Payer: Self-pay

## 2019-05-04 ENCOUNTER — Ambulatory Visit (INDEPENDENT_AMBULATORY_CARE_PROVIDER_SITE_OTHER): Payer: Medicare HMO | Admitting: Adult Health

## 2019-05-04 VITALS — BP 118/72 | HR 68 | Temp 97.4°F | Ht 68.0 in | Wt 218.6 lb

## 2019-05-04 DIAGNOSIS — G40909 Epilepsy, unspecified, not intractable, without status epilepticus: Secondary | ICD-10-CM | POA: Diagnosis not present

## 2019-05-04 NOTE — Patient Instructions (Signed)
Continue Depakote- Blood work today If you have any seizure events please let us know.   
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
## 2019-05-05 ENCOUNTER — Telehealth: Payer: Self-pay

## 2019-05-05 LAB — VALPROIC ACID LEVEL: Valproic Acid Lvl: 97 ug/mL (ref 50–100)

## 2019-05-05 NOTE — Telephone Encounter (Signed)
Called and let the pt know the recent lab results per NP [facility].  Valproic Acid Level  "Notes recorded by Ward Givens, NP on 05/05/2019 at 8:11 AM EST  Labs results are unremarkable. Please call patient with results. "

## 2019-05-16 ENCOUNTER — Other Ambulatory Visit: Payer: Self-pay | Admitting: Adult Health

## 2019-05-16 DIAGNOSIS — F5104 Psychophysiologic insomnia: Secondary | ICD-10-CM

## 2019-05-16 DIAGNOSIS — I10 Essential (primary) hypertension: Secondary | ICD-10-CM

## 2019-05-23 ENCOUNTER — Other Ambulatory Visit: Payer: Self-pay | Admitting: Family Medicine

## 2019-05-23 ENCOUNTER — Other Ambulatory Visit: Payer: Self-pay | Admitting: Adult Health

## 2019-05-23 DIAGNOSIS — G40409 Other generalized epilepsy and epileptic syndromes, not intractable, without status epilepticus: Secondary | ICD-10-CM

## 2019-05-31 ENCOUNTER — Other Ambulatory Visit: Payer: Self-pay | Admitting: Adult Health

## 2019-05-31 DIAGNOSIS — F331 Major depressive disorder, recurrent, moderate: Secondary | ICD-10-CM

## 2019-06-01 ENCOUNTER — Other Ambulatory Visit: Payer: Self-pay

## 2019-06-01 ENCOUNTER — Encounter: Payer: Self-pay | Admitting: Adult Health

## 2019-06-01 ENCOUNTER — Ambulatory Visit (INDEPENDENT_AMBULATORY_CARE_PROVIDER_SITE_OTHER): Payer: Medicare HMO | Admitting: Adult Health

## 2019-06-01 VITALS — BP 110/68 | HR 68 | Temp 98.6°F | Ht 68.0 in | Wt 226.5 lb

## 2019-06-01 DIAGNOSIS — I159 Secondary hypertension, unspecified: Secondary | ICD-10-CM

## 2019-06-01 DIAGNOSIS — Z79899 Other long term (current) drug therapy: Secondary | ICD-10-CM | POA: Diagnosis not present

## 2019-06-01 DIAGNOSIS — F1911 Other psychoactive substance abuse, in remission: Secondary | ICD-10-CM

## 2019-06-01 DIAGNOSIS — E7800 Pure hypercholesterolemia, unspecified: Secondary | ICD-10-CM | POA: Diagnosis not present

## 2019-06-01 DIAGNOSIS — E785 Hyperlipidemia, unspecified: Secondary | ICD-10-CM

## 2019-06-01 DIAGNOSIS — Z0000 Encounter for general adult medical examination without abnormal findings: Secondary | ICD-10-CM

## 2019-06-01 DIAGNOSIS — R569 Unspecified convulsions: Secondary | ICD-10-CM

## 2019-06-01 DIAGNOSIS — F3170 Bipolar disorder, currently in remission, most recent episode unspecified: Secondary | ICD-10-CM

## 2019-06-01 MED ORDER — ATORVASTATIN CALCIUM 20 MG PO TABS: ORAL_TABLET | ORAL | 0 refills | Status: DC

## 2019-06-01 NOTE — Assessment & Plan Note (Signed)
BP much improved today- 110/68, HR 68 Currently on Toprol XL 25 1/2 tab QD, Lisinopril 5mg  QD

## 2019-06-01 NOTE — Assessment & Plan Note (Addendum)
No seizure activity >12 months Currently taking Depakote 500 mg 3 times a day- 04/2019 Valproic Acid level -97  f/u with Neuro Nov 2021

## 2019-06-01 NOTE — Patient Instructions (Addendum)
Dyslipidemia Dyslipidemia is an imbalance of waxy, fat-like substances (lipids) in the blood. The body needs lipids in small amounts. Dyslipidemia often involves a high level of cholesterol or triglycerides, which are types of lipids. Common forms of dyslipidemia include:  High levels of LDL cholesterol. LDL is the type of cholesterol that causes fatty deposits (plaques) to build up in the blood vessels that carry blood away from your heart (arteries).  Low levels of HDL cholesterol. HDL cholesterol is the type of cholesterol that protects against heart disease. High levels of HDL remove the LDL buildup from arteries.  High levels of triglycerides. Triglycerides are a fatty substance in the blood that is linked to a buildup of plaques in the arteries. What are the causes? Primary dyslipidemia is caused by changes (mutations) in genes that are passed down through families (inherited). These mutations cause several types of dyslipidemia. Secondary dyslipidemia is caused by lifestyle choices and diseases that lead to dyslipidemia, such as:  Eating a diet that is high in animal fat.  Not getting enough exercise.  Having diabetes, kidney disease, liver disease, or thyroid disease.  Drinking large amounts of alcohol.  Using certain medicines. What increases the risk? You are more likely to develop this condition if you are an older man or if you are a woman who has gone through menopause. Other risk factors include:  Having a family history of dyslipidemia.  Taking certain medicines, including birth control pills, steroids, some diuretics, and beta-blockers.  Smoking cigarettes.  Eating a high-fat diet.  Having certain medical conditions such as diabetes, polycystic ovary syndrome (PCOS), kidney disease, liver disease, or hypothyroidism.  Not exercising regularly.  Being overweight or obese with too much belly fat. What are the signs or symptoms? In most cases, dyslipidemia does not  usually cause any symptoms. In severe cases, very high lipid levels can cause:  Fatty bumps under the skin (xanthomas).  White or gray ring around the black center (pupil) of the eye. Very high triglyceride levels can cause inflammation of the pancreas (pancreatitis). How is this diagnosed? Your health care provider may diagnose dyslipidemia based on a routine blood test (fasting blood test). Because most people do not have symptoms of the condition, this blood testing (lipid profile) is done on adults age 81 and older and is repeated every 5 years. This test checks:  Total cholesterol. This measures the total amount of cholesterol in your blood, including LDL cholesterol, HDL cholesterol, and triglycerides. A healthy number is below 200.  LDL cholesterol. The target number for LDL cholesterol is different for each person, depending on individual risk factors. Ask your health care provider what your LDL cholesterol should be.  HDL cholesterol. An HDL level of 60 or higher is best because it helps to protect against heart disease. A number below 45 for men or below 64 for women increases the risk for heart disease.  Triglycerides. A healthy triglyceride number is below 150. If your lipid profile is abnormal, your health care provider may do other blood tests. How is this treated? Treatment depends on the type of dyslipidemia that you have and your other risk factors for heart disease and stroke. Your health care provider will have a target range for your lipid levels based on this information. For many people, this condition may be treated by lifestyle changes, such as diet and exercise. Your health care provider may recommend that you:  Get regular exercise.  Make changes to your diet.  Quit smoking if you  smoke. If diet changes and exercise do not help you reach your goals, your health care provider may also prescribe medicine to lower lipids. The most commonly prescribed type of medicine  lowers your LDL cholesterol (statin drug). If you have a high triglyceride level, your provider may prescribe another type of drug (fibrate) or an omega-3 fish oil supplement, or both. Follow these instructions at home:  Eating and drinking  Follow instructions from your health care provider or dietitian about eating or drinking restrictions.  Eat a healthy diet as told by your health care provider. This can help you reach and maintain a healthy weight, lower your LDL cholesterol, and raise your HDL cholesterol. This may include: ? Limiting your calories, if you are overweight. ? Eating more fruits, vegetables, whole grains, fish, and lean meats. ? Limiting saturated fat, trans fat, and cholesterol.  If you drink alcohol: ? Limit how much you use. ? Be aware of how much alcohol is in your drink. In the [place] one drink equals one 12 oz bottle of beer (355 mL), one 5 oz glass of wine (148 mL), or one 1 oz glass of hard liquor (44 mL).  Do not drink alcohol if: ? Your health care provider tells you not to drink. ? You are pregnant, may be pregnant, or are planning to become pregnant. Activity  Get regular exercise. Start an exercise and strength training program as told by your health care provider. Ask your health care provider what activities are safe for you. Your health care provider may recommend: ? 30 minutes of aerobic activity 4-6 days a week. Brisk walking is an example of aerobic activity. ? Strength training 2 days a week. General instructions  Do not use any products that contain nicotine or tobacco, such as cigarettes, e-cigarettes, and chewing tobacco. If you need help quitting, ask your health care provider.  Take over-the-counter and prescription medicines only as told by your health care provider. This includes supplements.  Keep all follow-up visits as told by your health care provider. Contact a health care provider if:  You are: ? Having trouble sticking to your  exercise or diet plan. ? Struggling to quit smoking or control your use of alcohol. Summary  Dyslipidemia often involves a high level of cholesterol or triglycerides, which are types of lipids.  Treatment depends on the type of dyslipidemia that you have and your other risk factors for heart disease and stroke.  For many people, treatment starts with lifestyle changes, such as diet and exercise.  Your health care provider may prescribe medicine to lower lipids. This information is not intended to replace advice given to you by your health care provider. Make sure you discuss any questions you have with your health care provider. Document Released: 06/07/2013 Document Revised: 01/25/2018 Document Reviewed: 01/01/2018 Elsevier Patient Education  2020 [place] all medications as directed, with one change- Increase Atorvastatin from 10mg  to 20mg  once daily- need to lower LDL (bad) cholesterol <70, last LDL was 88 when checked in Oct 2020. Since you have plenty of Atorvastatin 10mg - take 2 tablet to equal 20mg  once daily. Please schedule fasting lab appt in 6 weeks, re: check cholesterol panel and liver function. Please keep appt's with Cardiology and [facility] at end of this month. Remain well hydrated with water, follow heart healthy diet. Reduce cigar use by at least 50%- you can do it! Continue to social distance and wear a mask when in public. Follow-up here in 3 months,  sooner if needed. GREAT TO SEE YOU!

## 2019-06-01 NOTE — Assessment & Plan Note (Signed)
Followed by Franki Cabot 300mg  QD Sertraline 100mg  QD
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
## 2019-06-01 NOTE — Assessment & Plan Note (Signed)
LFTs normal when checked in Oct Recommend increasing Atorvastatin from 10mg  to 20mg - he is agreeable Lipid/Hepatic fx panel check in 6 weeks

## 2019-06-01 NOTE — Assessment & Plan Note (Signed)
>>  ASSESSMENT AND PLAN FOR SEIZURES (HCC) WRITTEN ON 06/01/2019  3:33 PM BY DANFORD, KATY D, NP  No seizure activity >12 months Currently taking Depakote 500 mg 3 times a day- 04/2019 Valproic Acid level -97  f/u with Neuro Nov 2021

## 2019-06-01 NOTE — Assessment & Plan Note (Signed)
Continue all medications as directed, with one change- Increase Atorvastatin from 10mg  to 20mg  once daily- need to lower LDL (bad) cholesterol <70, last LDL was 88 when checked in Oct 2020. Since you have plenty of Atorvastatin 10mg - take 2 tablet to equal 20mg  once daily. Please schedule fasting lab appt in 6 weeks, re: check cholesterol panel and liver function. Please keep appt's with Cardiology and [facility] at end of this month. Remain well hydrated with water, follow heart healthy diet. Reduce cigar use by at least 50%- you can do it! Continue to social distance and wear a mask when in public. Follow-up here in 3 months, sooner if needed.

## 2019-06-01 NOTE — Assessment & Plan Note (Signed)
Only illicit drug use is marijuana once every 1-2 weeks.

## 2019-06-07 ENCOUNTER — Ambulatory Visit: Payer: Medicare Other | Admitting: Cardiology

## 2019-06-08 ENCOUNTER — Other Ambulatory Visit: Payer: Self-pay | Admitting: Adult Health

## 2019-06-15 ENCOUNTER — Other Ambulatory Visit: Payer: Self-pay | Admitting: Adult Health

## 2019-06-15 DIAGNOSIS — I10 Essential (primary) hypertension: Secondary | ICD-10-CM

## 2019-06-15 DIAGNOSIS — F5104 Psychophysiologic insomnia: Secondary | ICD-10-CM

## 2019-06-16 ENCOUNTER — Ambulatory Visit: Payer: Medicare Other | Admitting: Cardiology
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
## 2019-06-22 ENCOUNTER — Other Ambulatory Visit: Payer: Self-pay

## 2019-06-22 MED ORDER — ALBUTEROL SULFATE HFA 108 (90 BASE) MCG/ACT IN AERS: INHALATION_SPRAY | RESPIRATORY_TRACT | 0 refills | Status: DC

## 2019-06-24 ENCOUNTER — Other Ambulatory Visit: Payer: Self-pay | Admitting: Adult Health

## 2019-07-11 ENCOUNTER — Other Ambulatory Visit: Payer: Self-pay | Admitting: Family Medicine

## 2019-07-11 DIAGNOSIS — G40409 Other generalized epilepsy and epileptic syndromes, not intractable, without status epilepticus: Secondary | ICD-10-CM

## 2019-07-11 NOTE — Telephone Encounter (Signed)
Requested medication (s) are due for refill today: no  Requested medication (s) are on the active medication list: yes  Last refill:  02/22/2019  Future visit scheduled: no  Notes to clinic: this refill cannot be delegated    Requested Prescriptions  Pending Prescriptions Disp Refills   divalproex (DEPAKOTE) 500 MG DR tablet [Pharmacy Med Name: DIVALPROEX SOD DR 500 MG TA 500 Tablet] 270 tablet 3    Sig: TAKE 1 TABLET BY MOUTH 3 TIMES DAILY. PT MUST CALL FOR APPT FOR NEW PCP      Not Delegated - Neurology:  Anticonvulsants - Valproates Failed - 07/11/2019 10:50 AM      Failed - This refill cannot be delegated      Failed - PLT in normal range and within 360 days    Platelets  Date Value Ref Range Status  04/13/2019 69 (LL) 150 - 450 x10E3/uL Final    Comment:    Actual platelet count may be somewhat higher than reported due to aggregation of platelets in this sample.    Platelet Count, POC  Date Value Ref Range Status  03/09/2017 39 (A) 142 - 424 K/uL Final          Failed - WBC in normal range and within 360 days    WBC  Date Value Ref Range Status  04/13/2019 2.7 (L) 3.4 - 10.8 x10E3/uL Final  08/18/2017 3.5 (L) 4.0 - 10.5 K/uL Final          Failed - Valid encounter within last 12 months    Recent Outpatient Visits           1 year ago Psychophysiological insomnia   [facility], PA-C   2 years ago Smoker   [facility], PA-C   2 years ago Petechial rash   [facility], PA-C   2 years ago Petechial rash   [facility], Audrie Lia, PA-C   2 years ago Insomnia secondary to depression with anxiety   [facility], Audrie Lia, PA-C              Passed - AST in normal range and within 360 days    AST  Date Value Ref Range Status  04/13/2019 32 0 - 40 IU/L Final  03/09/2017 28 5 - 34 U/L Final          Passed - ALT in normal range and within  360 days    ALT  Date Value Ref Range Status  04/13/2019 31 0 - 44 IU/L Final  03/09/2017 24 0 - 55 U/L Final          Passed - HGB in normal range and within 360 days    Hemoglobin  Date Value Ref Range Status  04/13/2019 13.4 13.0 - 17.7 g/dL Final   HGB  Date Value Ref Range Status  03/09/2017 14.4 13.0 - 17.1 g/dL Final          Passed - HCT in normal range and within 360 days    HCT  Date Value Ref Range Status  03/09/2017 41.7 38.4 - 49.9 % Final   Hematocrit  Date Value Ref Range Status  04/13/2019 39.5 37.5 - 51.0 % Final          Passed - Valproic Acid (serum) in normal range and within 360 days    Valproic Acid Lvl  Date Value Ref  Range Status  05/04/2019 97 50 - 100 ug/mL Final    Comment:                                    Detection Limit = 4                            <4 indicates None Detected Toxicity may occur at levels of 100-500. Measurements of free unbound valproic acid may improve the assess- ment of clinical response.

## 2019-07-12 ENCOUNTER — Other Ambulatory Visit: Payer: Self-pay | Admitting: Adult Health

## 2019-07-12 DIAGNOSIS — G40409 Other generalized epilepsy and epileptic syndromes, not intractable, without status epilepticus: Secondary | ICD-10-CM

## 2019-07-12 NOTE — Telephone Encounter (Signed)
Patient called pharmacy  to request refill on Rx but was told his Provider will have to send New Rx .  Refill requested on :    divalproex (DEPAKOTE) 500 MG DR tablet [id number]   Order Details Dose, Route, Frequency: As Directed  Dispense Quantity: 272 tablet Refills: 3       Sig: TAKE 1 TABLET (500 MG TOTAL) BY MOUTH 3 TIMES DAILY. Pt must call for appt for new PCP      Start Date: 05/31/18 End Date: --  Written Date: 05/31/18 Expiration Date: 05/31/19    Diagnosis Association: Tonic clonic seizures ([place]) (G40.409)  Original Order:  divalproex (DEPAKOTE) 500 MG DR tablet [id number]  Providers  Ordering and Authorizing Provider: Rutherford Guys, MD NPI: [id number]  DEA #: [id number]      ---Forwarding request to med asst for review w/ provider for approval--- if approved send refill order to  :   [place], [place] - [place] [phone] (Phone) [phone] (Fax)   -glh

## 2019-07-13 ENCOUNTER — Telehealth: Payer: Self-pay | Admitting: Adult Health

## 2019-07-13 DIAGNOSIS — G40409 Other generalized epilepsy and epileptic syndromes, not intractable, without status epilepticus: Secondary | ICD-10-CM

## 2019-07-13 MED ORDER — DIVALPROEX SODIUM 500 MG PO DR TAB: DELAYED_RELEASE_TABLET | ORAL | 3 refills | Status: DC

## 2019-07-13 NOTE — Telephone Encounter (Signed)
Called patient who stated his PCP/[place]  retired; he now sees Manus Rudd NP with a Software engineer. He stated she refuses to refill Divalproex. The bottle he now has lists Dr Pamella Pert, but patient states he has never seen that Dr. He is asking what to do to get his medication filled. I advised will send to [facility] NP whom he saw Nov 2020, and call him back. He  verbalized understanding, appreciation.

## 2019-07-13 NOTE — Telephone Encounter (Signed)
Called patient and informed him the NP sent in Rx for Divalproex to [facility]. Patient verbalized understanding, appreciation.

## 2019-07-13 NOTE — Telephone Encounter (Signed)
Refills sent

## 2019-07-13 NOTE — Telephone Encounter (Signed)
We have not prescribed these medications for the patient previously.  Please review and refill if appropriate.  T. Maddison Kilner, CMA  

## 2019-07-13 NOTE — Telephone Encounter (Signed)
Pt informed to request from neuro.  Pt expressed understanding and is agreeable.  Charyl Bigger, CMA

## 2019-07-13 NOTE — Telephone Encounter (Signed)
Pt called needing to speak to RN due to him having trouble getting his divalproex (DEPAKOTE) 500 MG DR tablet filled. Please advise.

## 2019-07-29 ENCOUNTER — Other Ambulatory Visit: Payer: Self-pay | Admitting: Adult Health

## 2019-08-19 ENCOUNTER — Other Ambulatory Visit: Payer: Self-pay | Admitting: Adult Health

## 2019-09-06 ENCOUNTER — Other Ambulatory Visit: Payer: Self-pay | Admitting: Adult Health

## 2019-09-13 ENCOUNTER — Other Ambulatory Visit: Payer: Self-pay | Admitting: Adult Health

## 2019-09-13 DIAGNOSIS — F5104 Psychophysiologic insomnia: Secondary | ICD-10-CM

## 2019-09-13 DIAGNOSIS — I10 Essential (primary) hypertension: Secondary | ICD-10-CM

## 2019-09-14 ENCOUNTER — Other Ambulatory Visit: Payer: Self-pay | Admitting: Family Medicine

## 2019-09-14 ENCOUNTER — Telehealth: Payer: Self-pay | Admitting: Adult Health

## 2019-09-14 DIAGNOSIS — I10 Essential (primary) hypertension: Secondary | ICD-10-CM

## 2019-09-14 DIAGNOSIS — F5104 Psychophysiologic insomnia: Secondary | ICD-10-CM

## 2019-09-14 MED ORDER — BELSOMRA 10 MG PO TABS: 1.0000 | ORAL_TABLET | Freq: Every day | ORAL | 0 refills | Status: DC

## 2019-09-14 MED ORDER — METOPROLOL SUCCINATE ER 25 MG PO TB24: 12.5000 mg | ORAL_TABLET | Freq: Every day | ORAL | 0 refills | Status: DC

## 2019-09-14 NOTE — Telephone Encounter (Signed)
Patient is requesting a refill of his metoprolol and his Belsomra. If approved please send to [facility].

## 2019-09-14 NOTE — Addendum Note (Signed)
Addended by: Fonnie Mu on: 09/14/2019 11:40 AM   Modules accepted: Orders

## 2019-09-22 ENCOUNTER — Other Ambulatory Visit: Payer: Self-pay | Admitting: Adult Health

## 2019-09-22 ENCOUNTER — Other Ambulatory Visit: Payer: Self-pay | Admitting: Family Medicine

## 2019-09-29 ENCOUNTER — Other Ambulatory Visit: Payer: Self-pay | Admitting: Family Medicine

## 2019-09-29 DIAGNOSIS — I10 Essential (primary) hypertension: Secondary | ICD-10-CM

## 2019-10-06 ENCOUNTER — Encounter: Payer: Self-pay | Admitting: Family Medicine

## 2019-10-06 ENCOUNTER — Ambulatory Visit (INDEPENDENT_AMBULATORY_CARE_PROVIDER_SITE_OTHER): Payer: Medicare HMO | Admitting: Family Medicine

## 2019-10-06 ENCOUNTER — Other Ambulatory Visit: Payer: Self-pay

## 2019-10-06 VITALS — BP 115/72 | HR 71 | Temp 97.5°F | Ht 68.0 in | Wt 213.6 lb

## 2019-10-06 DIAGNOSIS — I2583 Coronary atherosclerosis due to lipid rich plaque: Secondary | ICD-10-CM

## 2019-10-06 DIAGNOSIS — Z79899 Other long term (current) drug therapy: Secondary | ICD-10-CM | POA: Diagnosis not present

## 2019-10-06 DIAGNOSIS — E785 Hyperlipidemia, unspecified: Secondary | ICD-10-CM | POA: Diagnosis not present

## 2019-10-06 DIAGNOSIS — F3170 Bipolar disorder, currently in remission, most recent episode unspecified: Secondary | ICD-10-CM

## 2019-10-06 DIAGNOSIS — G40909 Epilepsy, unspecified, not intractable, without status epilepticus: Secondary | ICD-10-CM

## 2019-10-06 DIAGNOSIS — I159 Secondary hypertension, unspecified: Secondary | ICD-10-CM

## 2019-10-06 DIAGNOSIS — J439 Emphysema, unspecified: Secondary | ICD-10-CM

## 2019-10-06 DIAGNOSIS — F1191 Opioid use, unspecified, in remission: Secondary | ICD-10-CM

## 2019-10-06 DIAGNOSIS — F5104 Psychophysiologic insomnia: Secondary | ICD-10-CM | POA: Diagnosis not present

## 2019-10-06 DIAGNOSIS — Z87898 Personal history of other specified conditions: Secondary | ICD-10-CM

## 2019-10-06 DIAGNOSIS — I2510 Atherosclerotic heart disease of native coronary artery without angina pectoris: Secondary | ICD-10-CM

## 2019-10-06 MED ORDER — ATORVASTATIN CALCIUM 20 MG PO TABS: 20.0000 mg | ORAL_TABLET | Freq: Every day | ORAL | 0 refills | Status: DC

## 2019-10-06 MED ORDER — LISINOPRIL 5 MG PO TABS: 5.0000 mg | ORAL_TABLET | Freq: Every day | ORAL | 0 refills | Status: DC

## 2019-10-06 MED ORDER — BUDESONIDE-FORMOTEROL FUMARATE 80-4.5 MCG/ACT IN AERO: 2.0000 | INHALATION_SPRAY | Freq: Two times a day (BID) | RESPIRATORY_TRACT | 2 refills | Status: DC

## 2019-10-06 MED ORDER — BUDESONIDE 180 MCG/ACT IN AEPB: 1.0000 | INHALATION_SPRAY | Freq: Two times a day (BID) | RESPIRATORY_TRACT | 2 refills | Status: DC

## 2019-10-06 MED ORDER — METOPROLOL SUCCINATE ER 25 MG PO TB24: ORAL_TABLET | ORAL | 0 refills | Status: DC
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
## 2019-10-06 NOTE — Patient Instructions (Signed)
COPD and Physical Activity Chronic obstructive pulmonary disease (COPD) is a long-term (chronic) condition that affects the lungs. COPD is a general term that can be used to describe many different lung problems that cause lung swelling (inflammation) and limit airflow, including chronic bronchitis and emphysema. The main symptom of COPD is shortness of breath, which makes it harder to do even simple tasks. This can also make it harder to exercise and be active. Talk with your health care provider about treatments to help you breathe better and actions you can take to prevent breathing problems during physical activity. What are the benefits of exercising with COPD? Exercising regularly is an important part of a healthy lifestyle. You can still exercise and do physical activities even though you have COPD. Exercise and physical activity improve your shortness of breath by increasing blood flow (circulation). This causes your heart to pump more oxygen through your body. Moderate exercise can improve your:  Oxygen use.  Energy level.  Shortness of breath.  Strength in your breathing muscles.  Heart health.  Sleep.  Self-esteem and feelings of self-worth.  Depression, stress, and anxiety levels. Exercise can benefit everyone with COPD. The severity of your disease may affect how hard you can exercise, especially at first, but everyone can benefit. Talk with your health care provider about how much exercise is safe for you, and which activities and exercises are safe for you. What actions can I take to prevent breathing problems during physical activity?  Sign up for a pulmonary rehabilitation program. This type of program may include: ? Education about lung diseases. ? Exercise classes that teach you how to exercise and be more active while improving your breathing. This usually involves:  Exercise using your lower extremities, such as a stationary bicycle.  About 30 minutes of exercise, 2  to 5 times per week, for 6 to 12 weeks  Strength training, such as push ups or leg lifts. ? Nutrition education. ? Group classes in which you can talk with others who also have COPD and learn ways to manage stress.  If you use an oxygen tank, you should use it while you exercise. Work with your health care provider to adjust your oxygen for your physical activity. Your resting flow rate is different from your flow rate during physical activity.  While you are exercising: ? Take slow breaths. ? Pace yourself and do not try to go too fast. ? Purse your lips while breathing out. Pursing your lips is similar to a kissing or whistling position. ? If doing exercise that uses a quick burst of effort, such as weight lifting:  Breathe in before starting the exercise.  Breathe out during the hardest part of the exercise (such as raising the weights). Where to find support You can find support for exercising with COPD from:  Your health care provider.  A pulmonary rehabilitation program.  Your local health department or community health programs.  Support groups, online or in-person. Your health care provider may be able to recommend support groups. Where to find more information You can find more information about exercising with COPD from:  American Lung Association: lung.org.  COPD Foundation: copdfoundation.org. Contact a health care provider if:  Your symptoms get worse.  You have chest pain.  You have nausea.  You have a fever.  You have trouble talking or catching your breath.  You want to start a new exercise program or a new activity. Summary  COPD is a general term that can   be used to describe many different lung problems that cause lung swelling (inflammation) and limit airflow. This includes chronic bronchitis and emphysema.  Exercise and physical activity improve your shortness of breath by increasing blood flow (circulation). This causes your heart to provide more  oxygen to your body.  Contact your health care provider before starting any exercise program or new activity. Ask your health care provider what exercises and activities are safe for you. This information is not intended to replace advice given to you by your health care provider. Make sure you discuss any questions you have with your health care provider. Document Revised: 09/22/2018 Document Reviewed: 06/25/2017 Elsevier Patient Education  2020 Elsevier Inc.   Chronic Obstructive Pulmonary Disease Chronic obstructive pulmonary disease (COPD) is a long-term (chronic) lung problem. When you have COPD, it is hard for air to get in and out of your lungs. Usually the condition gets worse over time, and your lungs will never return to normal. There are things you can do to keep yourself as healthy as possible.  Your doctor may treat your condition with: ? Medicines. ? Oxygen. ? Lung surgery.  Your doctor may also recommend: ? Rehabilitation. This includes steps to make your body work better. It may involve a team of specialists. ? Quitting smoking, if you smoke. ? Exercise and changes to your diet. ? Comfort measures (palliative care). Follow these instructions at home: Medicines  Take over-the-counter and prescription medicines only as told by your doctor.  Talk to your doctor before taking any cough or allergy medicines. You may need to avoid medicines that cause your lungs to be dry. Lifestyle  If you smoke, stop. Smoking makes the problem worse. If you need help quitting, ask your doctor.  Avoid being around things that make your breathing worse. This may include smoke, chemicals, and fumes.  Stay active, but remember to rest as well.  Learn and use tips on how to relax.  Make sure you get enough sleep. Most adults need at least 7 hours of sleep every night.  Eat healthy foods. Eat smaller meals more often. Rest before meals. Controlled breathing Learn and use tips on how to  control your breathing as told by your doctor. Try:  Breathing in (inhaling) through your nose for 1 second. Then, pucker your lips and breath out (exhale) through your lips for 2 seconds.  Putting one hand on your belly (abdomen). Breathe in slowly through your nose for 1 second. Your hand on your belly should move out. Pucker your lips and breathe out slowly through your lips. Your hand on your belly should move in as you breathe out.  Controlled coughing Learn and use controlled coughing to clear mucus from your lungs. Follow these steps: 1. Lean your head a little forward. 2. Breathe in deeply. 3. Try to hold your breath for 3 seconds. 4. Keep your mouth slightly open while coughing 2 times. 5. Spit any mucus out into a tissue. 6. Rest and do the steps again 1 or 2 times as needed. General instructions  Make sure you get all the shots (vaccines) that your doctor recommends. Ask your doctor about a flu shot and a pneumonia shot.  Use oxygen therapy and pulmonary rehabilitation if told by your doctor. If you need home oxygen therapy, ask your doctor if you should buy a tool to measure your oxygen level (oximeter).  Make a COPD action plan with your doctor. This helps you to know what to do if you   feel worse than usual.  Manage any other conditions you have as told by your doctor.  Avoid going outside when it is very hot, cold, or humid.  Avoid people who have a sickness you can catch (contagious).  Keep all follow-up visits as told by your doctor. This is important. Contact a doctor if:  You cough up more mucus than usual.  There is a change in the color or thickness of the mucus.  It is harder to breathe than usual.  Your breathing is faster than usual.  You have trouble sleeping.  You need to use your medicines more often than usual.  You have trouble doing your normal activities such as getting dressed or walking around the house. Get help right away if:  You have  shortness of breath while resting.  You have shortness of breath that stops you from: ? Being able to talk. ? Doing normal activities.  Your chest hurts for longer than 5 minutes.  Your skin color is more blue than usual.  Your pulse oximeter shows that you have low oxygen for longer than 5 minutes.  You have a fever.  You feel too tired to breathe normally. Summary  Chronic obstructive pulmonary disease (COPD) is a long-term lung problem.  The way your lungs work will never return to normal. Usually the condition gets worse over time. There are things you can do to keep yourself as healthy as possible.  Take over-the-counter and prescription medicines only as told by your doctor.  If you smoke, stop. Smoking makes the problem worse. This information is not intended to replace advice given to you by your health care provider. Make sure you discuss any questions you have with your health care provider. Document Revised: 05/15/2017 Document Reviewed: 07/07/2016 Elsevier Patient Education  2020 Elsevier Inc.  

## 2019-10-13 ENCOUNTER — Other Ambulatory Visit: Payer: Self-pay | Admitting: Family Medicine

## 2019-10-13 DIAGNOSIS — F5104 Psychophysiologic insomnia: Secondary | ICD-10-CM

## 2019-10-14 ENCOUNTER — Other Ambulatory Visit: Payer: Self-pay | Admitting: Family Medicine

## 2019-11-15 ENCOUNTER — Other Ambulatory Visit: Payer: Self-pay | Admitting: Physician Assistant

## 2019-11-15 DIAGNOSIS — F5104 Psychophysiologic insomnia: Secondary | ICD-10-CM

## 2019-11-28 ENCOUNTER — Other Ambulatory Visit: Payer: Self-pay | Admitting: Physician Assistant

## 2019-11-28 DIAGNOSIS — E785 Hyperlipidemia, unspecified: Secondary | ICD-10-CM

## 2019-11-28 DIAGNOSIS — I159 Secondary hypertension, unspecified: Secondary | ICD-10-CM

## 2019-11-29 ENCOUNTER — Other Ambulatory Visit: Payer: Medicare HMO

## 2019-12-02 ENCOUNTER — Other Ambulatory Visit: Payer: Medicare HMO

## 2019-12-05 ENCOUNTER — Other Ambulatory Visit: Payer: Self-pay | Admitting: Physician Assistant

## 2019-12-05 DIAGNOSIS — I159 Secondary hypertension, unspecified: Secondary | ICD-10-CM

## 2019-12-06 ENCOUNTER — Ambulatory Visit: Payer: Medicare HMO | Admitting: Physician Assistant

## 2019-12-14 ENCOUNTER — Other Ambulatory Visit: Payer: Medicare HMO

## 2019-12-14 ENCOUNTER — Other Ambulatory Visit: Payer: Self-pay

## 2019-12-14 DIAGNOSIS — E785 Hyperlipidemia, unspecified: Secondary | ICD-10-CM

## 2019-12-14 DIAGNOSIS — I159 Secondary hypertension, unspecified: Secondary | ICD-10-CM

## 2019-12-15 LAB — ALT: ALT: 18 IU/L (ref 0–44)

## 2019-12-15 LAB — LIPID PANEL
Chol/HDL Ratio: 4 ratio (ref 0.0–5.0)
Cholesterol, Total: 127 mg/dL (ref 100–199)
HDL: 32 mg/dL — ABNORMAL LOW (ref >39)
LDL Chol Calc (NIH): 70 mg/dL (ref 0–99)
Triglycerides: 144 mg/dL (ref 0–149)
VLDL Cholesterol Cal: 25 mg/dL (ref 5–40)

## 2019-12-20 ENCOUNTER — Ambulatory Visit: Payer: Medicare HMO | Admitting: Physician Assistant

## 2019-12-21 ENCOUNTER — Other Ambulatory Visit: Payer: Self-pay | Admitting: Family Medicine

## 2019-12-30 ENCOUNTER — Other Ambulatory Visit: Payer: Self-pay | Admitting: Family Medicine

## 2020-01-04 ENCOUNTER — Other Ambulatory Visit: Payer: Self-pay | Admitting: Family Medicine

## 2020-01-04 DIAGNOSIS — E785 Hyperlipidemia, unspecified: Secondary | ICD-10-CM

## 2020-01-11 ENCOUNTER — Other Ambulatory Visit: Payer: Self-pay | Admitting: Physician Assistant

## 2020-01-11 DIAGNOSIS — F5104 Psychophysiologic insomnia: Secondary | ICD-10-CM

## 2020-01-11 DIAGNOSIS — I159 Secondary hypertension, unspecified: Secondary | ICD-10-CM

## 2020-01-17 ENCOUNTER — Ambulatory Visit: Payer: Medicare HMO | Admitting: Physician Assistant

## 2020-01-26 ENCOUNTER — Other Ambulatory Visit: Payer: Self-pay | Admitting: Physician Assistant

## 2020-01-26 DIAGNOSIS — I159 Secondary hypertension, unspecified: Secondary | ICD-10-CM

## 2020-02-14 ENCOUNTER — Other Ambulatory Visit: Payer: Self-pay | Admitting: Family Medicine

## 2020-03-05 ENCOUNTER — Other Ambulatory Visit: Payer: Self-pay | Admitting: Physician Assistant

## 2020-03-26 ENCOUNTER — Other Ambulatory Visit: Payer: Self-pay | Admitting: Physician Assistant

## 2020-03-26 DIAGNOSIS — I159 Secondary hypertension, unspecified: Secondary | ICD-10-CM

## 2020-03-26 DIAGNOSIS — Z79899 Other long term (current) drug therapy: Secondary | ICD-10-CM

## 2020-04-09 ENCOUNTER — Other Ambulatory Visit: Payer: Self-pay | Admitting: Physician Assistant

## 2020-04-16 ENCOUNTER — Telehealth: Payer: Self-pay | Admitting: Physician Assistant

## 2020-04-16 ENCOUNTER — Other Ambulatory Visit: Payer: Self-pay | Admitting: Physician Assistant

## 2020-04-16 DIAGNOSIS — F5104 Psychophysiologic insomnia: Secondary | ICD-10-CM

## 2020-04-16 NOTE — Telephone Encounter (Signed)
Please call patient to schedule OV apt. Needed for further refills. AS, CMA

## 2020-04-25 ENCOUNTER — Other Ambulatory Visit: Payer: Self-pay | Admitting: Physician Assistant

## 2020-04-25 DIAGNOSIS — I159 Secondary hypertension, unspecified: Secondary | ICD-10-CM

## 2020-05-03 ENCOUNTER — Ambulatory Visit: Payer: Medicare HMO | Admitting: Adult Health

## 2020-05-14 ENCOUNTER — Other Ambulatory Visit: Payer: Self-pay | Admitting: Physician Assistant

## 2020-05-14 DIAGNOSIS — I159 Secondary hypertension, unspecified: Secondary | ICD-10-CM

## 2020-05-16 ENCOUNTER — Other Ambulatory Visit: Payer: Self-pay | Admitting: Physician Assistant

## 2020-05-16 ENCOUNTER — Telehealth: Payer: Self-pay

## 2020-05-16 DIAGNOSIS — F5104 Psychophysiologic insomnia: Secondary | ICD-10-CM

## 2020-05-16 NOTE — Telephone Encounter (Signed)
Patient called 05-16-20 and tried to leave VM but the mailbox was full.

## 2020-05-16 NOTE — Telephone Encounter (Signed)
Please call pt to schedule appt.  No further refills until pt is seen.  T. Kairav Russomanno, CMA  

## 2020-05-22 ENCOUNTER — Other Ambulatory Visit: Payer: Self-pay | Admitting: Physician Assistant

## 2020-05-22 DIAGNOSIS — F5104 Psychophysiologic insomnia: Secondary | ICD-10-CM

## 2020-05-22 MED ORDER — BELSOMRA 10 MG PO TABS: 1.0000 | ORAL_TABLET | Freq: Every evening | ORAL | 0 refills | Status: DC | PRN

## 2020-05-22 NOTE — Telephone Encounter (Signed)
Called and voicemail was full.  12-1

## 2020-05-22 NOTE — Telephone Encounter (Signed)
Patient is requesting refill. I printed this refill but because our printer is not working properly the rx was not readable.   Please approve refill if deemed appropriate. AS, CMA

## 2020-05-24 ENCOUNTER — Other Ambulatory Visit: Payer: Self-pay | Admitting: Physician Assistant

## 2020-05-24 DIAGNOSIS — F5104 Psychophysiologic insomnia: Secondary | ICD-10-CM

## 2020-05-24 MED ORDER — BELSOMRA 10 MG PO TABS: 1.0000 | ORAL_TABLET | Freq: Every evening | ORAL | 0 refills | Status: DC | PRN

## 2020-05-24 NOTE — Telephone Encounter (Signed)
Reordering this rx refill. Our printer was out of order when we originally printed this and it never came through after printer was fixed. Herb Grays is aware. AS< CMA

## 2020-05-25 ENCOUNTER — Other Ambulatory Visit: Payer: Self-pay

## 2020-05-25 DIAGNOSIS — F5104 Psychophysiologic insomnia: Secondary | ICD-10-CM

## 2020-05-25 MED ORDER — BELSOMRA 10 MG PO TABS: 1.0000 | ORAL_TABLET | Freq: Every evening | ORAL | 0 refills | Status: DC | PRN

## 2020-05-25 NOTE — Telephone Encounter (Signed)
Pharmacy called stating that [facility] only comes packaged for 30 day supply and they cannot break the pack for the 15 day supply that was faxed.  Please send RX for 30 day supply.  Pt has appt on 03/28/2020.  Charyl Bigger, CMA

## 2020-05-28 ENCOUNTER — Ambulatory Visit: Payer: Medicare HMO | Admitting: Physician Assistant

## 2020-06-22 ENCOUNTER — Other Ambulatory Visit: Payer: Self-pay | Admitting: Physician Assistant

## 2020-06-22 DIAGNOSIS — F5104 Psychophysiologic insomnia: Secondary | ICD-10-CM

## 2020-06-22 DIAGNOSIS — I159 Secondary hypertension, unspecified: Secondary | ICD-10-CM

## 2020-06-24 ENCOUNTER — Telehealth: Payer: Self-pay | Admitting: Physician Assistant

## 2020-06-24 NOTE — Telephone Encounter (Signed)
Please contact patient to schedule apt for further refills per last AVS. Patient will not get refills on some meds per refill protocol. AS, CMA

## 2020-06-25 NOTE — Telephone Encounter (Signed)
Patient scheduled for Feb 2nd.

## 2020-06-29 ENCOUNTER — Other Ambulatory Visit: Payer: Self-pay | Admitting: Adult Health

## 2020-06-29 DIAGNOSIS — G40409 Other generalized epilepsy and epileptic syndromes, not intractable, without status epilepticus: Secondary | ICD-10-CM

## 2020-07-04 ENCOUNTER — Other Ambulatory Visit: Payer: Self-pay | Admitting: Physician Assistant

## 2020-07-04 DIAGNOSIS — F5104 Psychophysiologic insomnia: Secondary | ICD-10-CM

## 2020-07-18 ENCOUNTER — Encounter: Payer: Self-pay | Admitting: Physician Assistant

## 2020-07-18 ENCOUNTER — Other Ambulatory Visit: Payer: Self-pay

## 2020-07-18 ENCOUNTER — Ambulatory Visit (INDEPENDENT_AMBULATORY_CARE_PROVIDER_SITE_OTHER): Payer: 59 | Admitting: Physician Assistant

## 2020-07-18 VITALS — BP 108/65 | HR 56 | Ht 68.0 in | Wt 198.2 lb

## 2020-07-18 DIAGNOSIS — I159 Secondary hypertension, unspecified: Secondary | ICD-10-CM

## 2020-07-18 DIAGNOSIS — L989 Disorder of the skin and subcutaneous tissue, unspecified: Secondary | ICD-10-CM | POA: Diagnosis not present

## 2020-07-18 DIAGNOSIS — R351 Nocturia: Secondary | ICD-10-CM

## 2020-07-18 DIAGNOSIS — F5104 Psychophysiologic insomnia: Secondary | ICD-10-CM

## 2020-07-18 DIAGNOSIS — G40909 Epilepsy, unspecified, not intractable, without status epilepticus: Secondary | ICD-10-CM

## 2020-07-18 DIAGNOSIS — F3170 Bipolar disorder, currently in remission, most recent episode unspecified: Secondary | ICD-10-CM

## 2020-07-18 DIAGNOSIS — E785 Hyperlipidemia, unspecified: Secondary | ICD-10-CM

## 2020-07-18 DIAGNOSIS — J439 Emphysema, unspecified: Secondary | ICD-10-CM | POA: Diagnosis not present

## 2020-07-18 MED ORDER — ALBUTEROL SULFATE HFA 108 (90 BASE) MCG/ACT IN AERS: INHALATION_SPRAY | RESPIRATORY_TRACT | 1 refills | Status: DC

## 2020-07-18 MED ORDER — BELSOMRA 10 MG PO TABS: ORAL_TABLET | ORAL | 0 refills | Status: DC

## 2020-07-18 MED ORDER — ALBUTEROL SULFATE (2.5 MG/3ML) 0.083% IN NEBU: 2.5000 mg | INHALATION_SOLUTION | Freq: Four times a day (QID) | RESPIRATORY_TRACT | 1 refills | Status: DC | PRN
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
## 2020-07-18 NOTE — Patient Instructions (Signed)
Urinary Frequency, Adult Urinary frequency means urinating more often than usual. You may urinate every 1-2 hours even though you drink a normal amount of fluid and do not have a bladder infection or condition. Although you urinate more often than normal, the total amount of urine produced in a day is normal. With urinary frequency, you may have an urgent need to urinate often. The stress and anxiety of needing to find a bathroom quickly can make this urge worse. This condition may go away on its own or you may need treatment at home. Home treatment may include bladder training, exercises, taking medicines, or making changes to your diet. Follow these instructions at home: Bladder health  Keep a bladder diary if told by your health care provider. Keep track of: ? What you eat and drink. ? How often you urinate. ? How much you urinate.  Follow a bladder training program if told by your health care provider. This may include: ? Learning to delay going to the bathroom. ? Double urinating (voiding). This helps if you are not completely emptying your bladder. ? Scheduled voiding.  Do Kegel exercises as told by your health care provider. Kegel exercises strengthen the muscles that help control urination, which may help the condition.   Eating and drinking  If told by your health care provider, make diet changes, such as: ? Avoiding caffeine. ? Drinking fewer fluids, especially alcohol. ? Not drinking in the evening. ? Avoiding foods or drinks that may irritate the bladder. These include coffee, tea, soda, artificial sweeteners, citrus, tomato-based foods, and chocolate. ? Eating foods that help prevent or ease constipation. Constipation can make this condition worse. Your health care provider may recommend that you:  Drink enough fluid to keep your urine pale yellow.  Take over-the-counter or prescription medicines.  Eat foods that are high in fiber, such as beans, whole grains, and fresh  fruits and vegetables.  Limit foods that are high in fat and processed sugars, such as fried or sweet foods. General instructions  Take over-the-counter and prescription medicines only as told by your health care provider.  Keep all follow-up visits as told by your health care provider. This is important. Contact a health care provider if:  You start urinating more often.  You feel pain or irritation when you urinate.  You notice blood in your urine.  Your urine looks cloudy.  You develop a fever.  You begin vomiting. Get help right away if:  You are unable to urinate. Summary  Urinary frequency means urinating more often than usual. With urinary frequency, you may urinate every 1-2 hours even though you drink a normal amount of fluid and do not have a bladder infection or other bladder condition.  Your health care provider may recommend that you keep a bladder diary, follow a bladder training program, or make dietary changes.  If told by your health care provider, do Kegel exercises to strengthen the muscles that help control urination.  Take over-the-counter and prescription medicines only as told by your health care provider.  Contact a health care provider if your symptoms do not improve or get worse. This information is not intended to replace advice given to you by your health care provider. Make sure you discuss any questions you have with your health care provider. Document Revised: 12/10/2017 Document Reviewed: 12/10/2017 Elsevier Patient Education  2021 [place]. Benign Prostatic Hyperplasia  Benign prostatic hyperplasia (BPH) is an enlarged prostate gland that is caused by the normal aging  process and not by cancer. The prostate is a walnut-sized gland that is involved in the production of semen. It is located in front of the rectum and below the bladder. The bladder stores urine and the urethra is the tube that carries the urine out of the body. The prostate  may get bigger as a man gets older. An enlarged prostate can press on the urethra. This can make it harder to pass urine. The build-up of urine in the bladder can cause infection. Back pressure and infection may progress to bladder damage and kidney (renal) failure. What are the causes? This condition is part of a normal aging process. However, not all men develop problems from this condition. If the prostate enlarges away from the urethra, urine flow will not be blocked. If it enlarges toward the urethra and compresses it, there will be problems passing urine. What increases the risk? This condition is more likely to develop in men over the age of 54 years. What are the signs or symptoms? Symptoms of this condition include:  Getting up often during the night to urinate.  Needing to urinate frequently during the day.  Difficulty starting urine flow.  Decrease in size and strength of your urine stream.  Leaking (dribbling) after urinating.  Inability to pass urine. This needs immediate treatment.  Inability to completely empty your bladder.  Pain when you pass urine. This is more common if there is also an infection.  Urinary tract infection (UTI). How is this diagnosed? This condition is diagnosed based on your medical history, a physical exam, and your symptoms. Tests will also be done, such as:  A post-void bladder scan. This measures any amount of urine that may remain in your bladder after you finish urinating.  A digital rectal exam. In a rectal exam, your health care provider checks your prostate by putting a lubricated, gloved finger into your rectum to feel the back of your prostate gland. This exam detects the size of your gland and any abnormal lumps or growths.  An exam of your urine (urinalysis).  A prostate specific antigen (PSA) screening. This is a blood test used to screen for prostate cancer.  An ultrasound. This test uses sound waves to electronically produce a  picture of your prostate gland. Your health care provider may refer you to a specialist in kidney and prostate diseases (urologist). How is this treated? Once symptoms begin, your health care provider will monitor your condition (active surveillance or watchful waiting). Treatment for this condition will depend on the severity of your condition. Treatment may include:  Observation and yearly exams. This may be the only treatment needed if your condition and symptoms are mild.  Medicines to relieve your symptoms, including: ? Medicines to shrink the prostate. ? Medicines to relax the muscle of the prostate.  Surgery in severe cases. Surgery may include: ? Prostatectomy. In this procedure, the prostate tissue is removed completely through an open incision or with a laparoscope or robotics. ? Transurethral resection of the prostate (TURP). In this procedure, a tool is inserted through the opening at the tip of the penis (urethra). It is used to cut away tissue of the inner core of the prostate. The pieces are removed through the same opening of the penis. This removes the blockage. ? Transurethral incision (TUIP). In this procedure, small cuts are made in the prostate. This lessens the prostate's pressure on the urethra. ? Transurethral microwave thermotherapy (TUMT). This procedure uses microwaves to create heat. The  heat destroys and removes a small amount of prostate tissue. ? Transurethral needle ablation (TUNA). This procedure uses radio frequencies to destroy and remove a small amount of prostate tissue. ? Interstitial laser coagulation ([place]). This procedure uses a laser to destroy and remove a small amount of prostate tissue. ? Transurethral electrovaporization (TUVP). This procedure uses electrodes to destroy and remove a small amount of prostate tissue. ? Prostatic urethral lift. This procedure inserts an implant to push the lobes of the prostate away from the urethra. Follow these  instructions at home:  Take over-the-counter and prescription medicines only as told by your health care provider.  Monitor your symptoms for any changes. Contact your health care provider with any changes.  Avoid drinking large amounts of liquid before going to bed or out in public.  Avoid or reduce how much caffeine or alcohol you drink.  Give yourself time when you urinate.  Keep all follow-up visits as told by your health care provider. This is important. Contact a health care provider if:  You have unexplained back pain.  Your symptoms do not get better with treatment.  You develop side effects from the medicine you are taking.  Your urine becomes very dark or has a bad smell.  Your lower abdomen becomes distended and you have trouble passing your urine. Get help right away if:  You have a fever or chills.  You suddenly cannot urinate.  You feel lightheaded, or very dizzy, or you faint.  There are large amounts of blood or clots in the urine.  Your urinary problems become hard to manage.  You develop moderate to severe low back or flank pain. The flank is the side of your body between the ribs and the hip. These symptoms may represent a serious problem that is an emergency. Do not wait to see if the symptoms will go away. Get medical help right away. Call your local emergency services (911 in the [place] Do not drive yourself to the hospital. Summary  Benign prostatic hyperplasia (BPH) is an enlarged prostate that is caused by the normal aging process and not by cancer.  An enlarged prostate can press on the urethra. This can make it hard to pass urine.  This condition is part of a normal aging process and is more likely to develop in men over the age of 58 years.  Get help right away if you suddenly cannot urinate. This information is not intended to replace advice given to you by your health care provider. Make sure you discuss any questions you have with your  health care provider. Document Revised: 02/09/2020 Document Reviewed: 02/09/2020 Elsevier Patient Education  [place].

## 2020-07-23 ENCOUNTER — Other Ambulatory Visit: Payer: Self-pay | Admitting: Physician Assistant

## 2020-07-23 DIAGNOSIS — Z0000 Encounter for general adult medical examination without abnormal findings: Secondary | ICD-10-CM

## 2020-07-23 DIAGNOSIS — E7800 Pure hypercholesterolemia, unspecified: Secondary | ICD-10-CM

## 2020-07-23 DIAGNOSIS — E785 Hyperlipidemia, unspecified: Secondary | ICD-10-CM

## 2020-07-24 ENCOUNTER — Other Ambulatory Visit: Payer: Self-pay | Admitting: Physician Assistant

## 2020-07-24 DIAGNOSIS — I159 Secondary hypertension, unspecified: Secondary | ICD-10-CM

## 2020-07-26 ENCOUNTER — Other Ambulatory Visit: Payer: Self-pay

## 2020-07-26 ENCOUNTER — Other Ambulatory Visit: Payer: 59

## 2020-07-26 DIAGNOSIS — Z0000 Encounter for general adult medical examination without abnormal findings: Secondary | ICD-10-CM

## 2020-07-26 DIAGNOSIS — E785 Hyperlipidemia, unspecified: Secondary | ICD-10-CM

## 2020-07-26 DIAGNOSIS — E7800 Pure hypercholesterolemia, unspecified: Secondary | ICD-10-CM

## 2020-07-27 LAB — COMPREHENSIVE METABOLIC PANEL
ALT: 15 IU/L (ref 0–44)
AST: 19 IU/L (ref 0–40)
Albumin/Globulin Ratio: 2.6 — ABNORMAL HIGH (ref 1.2–2.2)
Albumin: 4.9 g/dL — ABNORMAL HIGH (ref 3.8–4.8)
Alkaline Phosphatase: 52 IU/L (ref 44–121)
BUN/Creatinine Ratio: 16 (ref 10–24)
BUN: 14 mg/dL (ref 8–27)
Bilirubin Total: 0.3 mg/dL (ref 0.0–1.2)
CO2: 20 mmol/L (ref 20–29)
Calcium: 9.5 mg/dL (ref 8.6–10.2)
Chloride: 104 mmol/L (ref 96–106)
Creatinine, Ser: 0.86 mg/dL (ref 0.76–1.27)
GFR calc Af Amer: 104 mL/min/{1.73_m2} (ref >59)
GFR calc non Af Amer: 90 mL/min/{1.73_m2} (ref >59)
Globulin, Total: 1.9 g/dL (ref 1.5–4.5)
Glucose: 116 mg/dL — ABNORMAL HIGH (ref 65–99)
Potassium: 4.6 mmol/L (ref 3.5–5.2)
Sodium: 141 mmol/L (ref 134–144)
Total Protein: 6.8 g/dL (ref 6.0–8.5)

## 2020-07-27 LAB — HEMOGLOBIN A1C
Est. average glucose Bld gHb Est-mCnc: 103 mg/dL
Hgb A1c MFr Bld: 5.2 % (ref 4.8–5.6)

## 2020-07-27 LAB — LIPID PANEL
Chol/HDL Ratio: 6.2 ratio — ABNORMAL HIGH (ref 0.0–5.0)
Cholesterol, Total: 212 mg/dL — ABNORMAL HIGH (ref 100–199)
HDL: 34 mg/dL — ABNORMAL LOW (ref >39)
LDL Chol Calc (NIH): 144 mg/dL — ABNORMAL HIGH (ref 0–99)
Triglycerides: 187 mg/dL — ABNORMAL HIGH (ref 0–149)
VLDL Cholesterol Cal: 34 mg/dL (ref 5–40)

## 2020-07-27 LAB — CBC
Hematocrit: 46 % (ref 37.5–51.0)
Hemoglobin: 15.5 g/dL (ref 13.0–17.7)
MCH: 31.8 pg (ref 26.6–33.0)
MCHC: 33.7 g/dL (ref 31.5–35.7)
MCV: 95 fL (ref 79–97)
Platelets: 97 10*3/uL — CL (ref 150–450)
RBC: 4.87 x10E6/uL (ref 4.14–5.80)
RDW: 14 % (ref 11.6–15.4)
WBC: 5.9 10*3/uL (ref 3.4–10.8)

## 2020-07-27 LAB — TSH: TSH: 2.89 u[IU]/mL (ref 0.450–4.500)

## 2020-07-27 LAB — PSA: Prostate Specific Ag, Serum: 2.1 ng/mL (ref 0.0–4.0)

## 2020-07-31 ENCOUNTER — Other Ambulatory Visit: Payer: Self-pay | Admitting: Physician Assistant

## 2020-07-31 DIAGNOSIS — I159 Secondary hypertension, unspecified: Secondary | ICD-10-CM

## 2020-08-06 ENCOUNTER — Other Ambulatory Visit: Payer: Self-pay | Admitting: Physician Assistant

## 2020-08-06 MED ORDER — ALBUTEROL SULFATE HFA 108 (90 BASE) MCG/ACT IN AERS: 2.0000 | INHALATION_SPRAY | Freq: Four times a day (QID) | RESPIRATORY_TRACT | 0 refills | Status: DC | PRN

## 2020-08-06 NOTE — Telephone Encounter (Signed)
Per Herb Grays changing Ventolin HFA to Proventil due to insurance coverage. AS, CMA

## 2020-09-25 ENCOUNTER — Other Ambulatory Visit: Payer: Self-pay | Admitting: Physician Assistant

## 2020-09-25 DIAGNOSIS — I159 Secondary hypertension, unspecified: Secondary | ICD-10-CM

## 2020-10-12 ENCOUNTER — Other Ambulatory Visit: Payer: Self-pay | Admitting: Physician Assistant

## 2020-10-12 DIAGNOSIS — F5104 Psychophysiologic insomnia: Secondary | ICD-10-CM

## 2020-10-22 ENCOUNTER — Other Ambulatory Visit: Payer: Self-pay | Admitting: Physician Assistant

## 2020-10-22 DIAGNOSIS — I159 Secondary hypertension, unspecified: Secondary | ICD-10-CM

## 2020-10-25 ENCOUNTER — Other Ambulatory Visit: Payer: Self-pay | Admitting: Physician Assistant

## 2020-11-07 ENCOUNTER — Other Ambulatory Visit: Payer: Self-pay | Admitting: Physician Assistant

## 2020-11-07 DIAGNOSIS — I159 Secondary hypertension, unspecified: Secondary | ICD-10-CM

## 2020-11-14 ENCOUNTER — Other Ambulatory Visit: Payer: Self-pay | Admitting: Physician Assistant

## 2020-11-14 DIAGNOSIS — F5104 Psychophysiologic insomnia: Secondary | ICD-10-CM

## 2020-11-15 ENCOUNTER — Ambulatory Visit: Payer: 59 | Admitting: Physician Assistant

## 2020-11-23 ENCOUNTER — Other Ambulatory Visit: Payer: Self-pay | Admitting: Physician Assistant

## 2020-12-06 ENCOUNTER — Other Ambulatory Visit: Payer: Self-pay | Admitting: Physician Assistant

## 2020-12-06 ENCOUNTER — Other Ambulatory Visit: Payer: Self-pay | Admitting: Nurse Practitioner

## 2020-12-06 DIAGNOSIS — F5104 Psychophysiologic insomnia: Secondary | ICD-10-CM

## 2020-12-06 MED ORDER — BELSOMRA 10 MG PO TABS: 1.0000 | ORAL_TABLET | Freq: Every day | ORAL | 0 refills | Status: DC

## 2020-12-06 NOTE — Telephone Encounter (Signed)
Refilled belsomra for 30 days. Note to pharmacy that patient will need appointment prior to further refills being given.

## 2020-12-06 NOTE — Telephone Encounter (Signed)
Please call patient to schedule a follow up appointment.

## 2020-12-06 NOTE — Progress Notes (Signed)
Refilled belsomra for 30 days. Note to pharmacy that patient will need appointment prior to further refills being given.

## 2020-12-21 ENCOUNTER — Other Ambulatory Visit: Payer: Self-pay | Admitting: Physician Assistant

## 2020-12-21 DIAGNOSIS — I159 Secondary hypertension, unspecified: Secondary | ICD-10-CM

## 2020-12-25 ENCOUNTER — Encounter: Payer: Self-pay | Admitting: Dermatology

## 2020-12-25 ENCOUNTER — Other Ambulatory Visit: Payer: Self-pay

## 2020-12-25 ENCOUNTER — Ambulatory Visit (INDEPENDENT_AMBULATORY_CARE_PROVIDER_SITE_OTHER): Payer: 59 | Admitting: Dermatology

## 2020-12-25 DIAGNOSIS — Z1283 Encounter for screening for malignant neoplasm of skin: Secondary | ICD-10-CM | POA: Diagnosis not present

## 2020-12-25 DIAGNOSIS — D485 Neoplasm of uncertain behavior of skin: Secondary | ICD-10-CM

## 2020-12-25 DIAGNOSIS — C4492 Squamous cell carcinoma of skin, unspecified: Secondary | ICD-10-CM

## 2020-12-25 DIAGNOSIS — C44622 Squamous cell carcinoma of skin of right upper limb, including shoulder: Secondary | ICD-10-CM

## 2020-12-25 HISTORY — DX: Squamous cell carcinoma of skin, unspecified: C44.92

## 2020-12-25 NOTE — Progress Notes (Signed)
Sister with patiet

## 2020-12-25 NOTE — Patient Instructions (Signed)

## 2021-01-01 ENCOUNTER — Encounter: Payer: Self-pay | Admitting: Physician Assistant

## 2021-01-01 ENCOUNTER — Other Ambulatory Visit: Payer: Self-pay

## 2021-01-01 ENCOUNTER — Telehealth: Payer: Self-pay | Admitting: Dermatology

## 2021-01-01 ENCOUNTER — Ambulatory Visit (INDEPENDENT_AMBULATORY_CARE_PROVIDER_SITE_OTHER): Payer: 59 | Admitting: Physician Assistant

## 2021-01-01 VITALS — BP 98/62 | HR 68 | Temp 97.6°F | Ht 69.0 in | Wt 197.2 lb

## 2021-01-01 DIAGNOSIS — R351 Nocturia: Secondary | ICD-10-CM | POA: Diagnosis not present

## 2021-01-01 DIAGNOSIS — I2583 Coronary atherosclerosis due to lipid rich plaque: Secondary | ICD-10-CM

## 2021-01-01 DIAGNOSIS — E785 Hyperlipidemia, unspecified: Secondary | ICD-10-CM | POA: Diagnosis not present

## 2021-01-01 DIAGNOSIS — I159 Secondary hypertension, unspecified: Secondary | ICD-10-CM

## 2021-01-01 DIAGNOSIS — F5104 Psychophysiologic insomnia: Secondary | ICD-10-CM

## 2021-01-01 DIAGNOSIS — J432 Centrilobular emphysema: Secondary | ICD-10-CM

## 2021-01-01 DIAGNOSIS — I2510 Atherosclerotic heart disease of native coronary artery without angina pectoris: Secondary | ICD-10-CM

## 2021-01-01 MED ORDER — TAMSULOSIN HCL 0.4 MG PO CAPS: 0.4000 mg | ORAL_CAPSULE | Freq: Every day | ORAL | 0 refills | Status: DC

## 2021-01-01 MED ORDER — TRAZODONE HCL 50 MG PO TABS: 25.0000 mg | ORAL_TABLET | Freq: Every evening | ORAL | 0 refills | Status: DC | PRN

## 2021-01-01 MED ORDER — ATORVASTATIN CALCIUM 20 MG PO TABS
20.0000 mg | ORAL_TABLET | Freq: Every day | ORAL | 0 refills | Status: DC
Start: 2021-01-01 — End: 2021-04-01

## 2021-01-01 NOTE — Assessment & Plan Note (Signed)
-  Discussed with patient treatment adjustments to improve sleep and is agreeable to discontinue Belsomra and start trazodone. -Good sleep hygiene discussed. -Will continue to monitor and reassess at follow up visit.

## 2021-01-01 NOTE — Patient Instructions (Signed)
Insomnia Insomnia is a sleep disorder that makes it difficult to fall asleep or stay asleep. Insomnia can cause fatigue, low energy, difficulty concentrating, moodswings, and poor performance at work or school. There are three different ways to classify insomnia: Difficulty falling asleep. Difficulty staying asleep. Waking up too early in the morning. Any type of insomnia can be long-term (chronic) or short-term (acute). Both are common. Short-term insomnia usually lasts for three months or less. Chronic insomnia occurs at least three times a week for longer than threemonths. What are the causes? Insomnia may be caused by another condition, situation, or substance, such as: Anxiety. Certain medicines. Gastroesophageal reflux disease (GERD) or other gastrointestinal conditions. Asthma or other breathing conditions. Restless legs syndrome, sleep apnea, or other sleep disorders. Chronic pain. Menopause. Stroke. Abuse of alcohol, tobacco, or illegal drugs. Mental health conditions, such as depression. Caffeine. Neurological disorders, such as Alzheimer's disease. An overactive thyroid (hyperthyroidism). Sometimes, the cause of insomnia may not be known. What increases the risk? Risk factors for insomnia include: Gender. Women are affected more often than men. Age. Insomnia is more common as you get older. Stress. Lack of exercise. Irregular work schedule or working night shifts. Traveling between different time zones. Certain medical and mental health conditions. What are the signs or symptoms? If you have insomnia, the main symptom is having trouble falling asleep or having trouble staying asleep. This may lead to other symptoms, such as: Feeling fatigued or having low energy. Feeling nervous about going to sleep. Not feeling rested in the morning. Having trouble concentrating. Feeling irritable, anxious, or depressed. How is this diagnosed? This condition may be diagnosed based  on: Your symptoms and medical history. Your health care provider may ask about: Your sleep habits. Any medical conditions you have. Your mental health. A physical exam. How is this treated? Treatment for insomnia depends on the cause. Treatment may focus on treating an underlying condition that is causing insomnia. Treatment may also include: Medicines to help you sleep. Counseling or therapy. Lifestyle adjustments to help you sleep better. Follow these instructions at home: Eating and drinking  Limit or avoid alcohol, caffeinated beverages, and cigarettes, especially close to bedtime. These can disrupt your sleep. Do not eat a large meal or eat spicy foods right before bedtime. This can lead to digestive discomfort that can make it hard for you to sleep.  Sleep habits  Keep a sleep diary to help you and your health care provider figure out what could be causing your insomnia. Write down: When you sleep. When you wake up during the night. How well you sleep. How rested you feel the next day. Any side effects of medicines you are taking. What you eat and drink. Make your bedroom a dark, comfortable place where it is easy to fall asleep. Put up shades or blackout curtains to block light from outside. Use a white noise machine to block noise. Keep the temperature cool. Limit screen use before bedtime. This includes: Watching TV. Using your smartphone, tablet, or computer. Stick to a routine that includes going to bed and waking up at the same times every day and night. This can help you fall asleep faster. Consider making a quiet activity, such as reading, part of your nighttime routine. Try to avoid taking naps during the day so that you sleep better at night. Get out of bed if you are still awake after 15 minutes of trying to sleep. Keep the lights down, but try reading or doing a quiet   activity. When you feel sleepy, go back to bed.  General instructions Take over-the-counter  and prescription medicines only as told by your health care provider. Exercise regularly, as told by your health care provider. Avoid exercise starting several hours before bedtime. Use relaxation techniques to manage stress. Ask your health care provider to suggest some techniques that may work well for you. These may include: Breathing exercises. Routines to release muscle tension. Visualizing peaceful scenes. Make sure that you drive carefully. Avoid driving if you feel very sleepy. Keep all follow-up visits as told by your health care provider. This is important. Contact a health care provider if: You are tired throughout the day. You have trouble in your daily routine due to sleepiness. You continue to have sleep problems, or your sleep problems get worse. Get help right away if: You have serious thoughts about hurting yourself or someone else. If you ever feel like you may hurt yourself or others, or have thoughts about taking your own life, get help right away. You can go to your nearest emergency department or call: Your local emergency services (911 in the [place] A suicide crisis helpline, such as the National Suicide Prevention Lifeline at 1-800-273-8255. This is open 24 hours a day. Summary Insomnia is a sleep disorder that makes it difficult to fall asleep or stay asleep. Insomnia can be long-term (chronic) or short-term (acute). Treatment for insomnia depends on the cause. Treatment may focus on treating an underlying condition that is causing insomnia. Keep a sleep diary to help you and your health care provider figure out what could be causing your insomnia. This information is not intended to replace advice given to you by your health care provider. Make sure you discuss any questions you have with your healthcare provider. Document Revised: 04/12/2020 Document Reviewed: 04/12/2020 Elsevier Patient Education  2022 [employer]  

## 2021-01-01 NOTE — Telephone Encounter (Signed)
Results, ST 

## 2021-01-01 NOTE — Assessment & Plan Note (Signed)
-   Discussed with patient improving adherence with Symbicort and continue albuterol as needed. -Reviewed chest CT w/o contrast 08/18/2018: Lungs/Pleura: Smoking related respiratory bronchiolitis. Mild centrilobular emphysema. Scarring in the left lower lobe. Lungs are otherwise clear. No pleural fluid. Airway is unremarkable.
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
## 2021-01-01 NOTE — Assessment & Plan Note (Addendum)
-  Soft blood pressure today. Will continue to monitor, if BP consistently on the lower range of normal then will consider adjusting treatment therapy.  Continue current medication regimen. Will repeat CMP for medication monitoring at follow-up visit. -Recommend to stay well-hydrated -Will continue to monitor.

## 2021-01-01 NOTE — Assessment & Plan Note (Signed)
-  Last lipid panel: Total cholesterol 212, triglycerides 187, HDL 34, LDL 144 -Recommend to continue current medication regimen.  Provided refill. -Follow a low-fat diet. -Will continue to monitor and repeat lipid panel/hepatic function at follow-up visit.

## 2021-01-01 NOTE — Assessment & Plan Note (Signed)
-  Reviewed last cardiology consult 12/06/2018. -Patient reports intermittent chest pain, currently asymptomatic. Advised to follow up with cardiology as instructed at last visit. -Recommend to take nitroglycerin as needed for chest pain.

## 2021-01-02 ENCOUNTER — Telehealth: Payer: Self-pay | Admitting: *Deleted

## 2021-01-02 NOTE — Telephone Encounter (Signed)
Pathology to patient- surgery scheduled

## 2021-01-04 ENCOUNTER — Other Ambulatory Visit: Payer: Self-pay | Admitting: Nurse Practitioner

## 2021-01-04 DIAGNOSIS — F5104 Psychophysiologic insomnia: Secondary | ICD-10-CM

## 2021-01-05 ENCOUNTER — Encounter: Payer: Self-pay | Admitting: Dermatology

## 2021-01-05 NOTE — Progress Notes (Signed)
   New Patient   Subjective  SABDIEL KERKSTRA is a 65 y.o. male who presents for the following: Skin Problem and Warts (Right arm x 3 years skin horn per patient ).  Skin check, growing lesion on right forearm Location:  Duration:  Quality:  Associated Signs/Symptoms: Modifying Factors:  Severity:  Timing: Context:    The following portions of the chart were reviewed this encounter and updated as appropriate:  Tobacco  Allergies  Meds  Problems  Med Hx  Surg Hx  Fam Hx      Objective  Well appearing patient in no apparent distress; mood and affect are within normal limits. Waist up skin examination, no atypical pigmented lesions.  Right Forearm - Posterior       All skin waist up examined.   Assessment & Plan  Neoplasm of uncertain behavior of skin Right Forearm - Posterior  Skin / nail biopsy Type of biopsy: tangential   Informed consent: discussed and consent obtained   Timeout: patient name, date of birth, surgical site, and procedure verified   Anesthesia: the lesion was anesthetized in a standard fashion   Anesthetic:  1% lidocaine w/ epinephrine 1-100,000 local infiltration Instrument used: flexible razor blade   Hemostasis achieved with: ferric subsulfate   Outcome: patient tolerated procedure well   Post-procedure details: wound care instructions given    Specimen 2 - Surgical pathology Differential Diagnosis: ka  Check Margins: No  Encounter for screening for malignant neoplasm of skin  Annual skin examination.

## 2021-01-07 ENCOUNTER — Other Ambulatory Visit: Payer: Self-pay | Admitting: Nurse Practitioner

## 2021-01-07 ENCOUNTER — Other Ambulatory Visit: Payer: Self-pay | Admitting: Physician Assistant

## 2021-01-07 DIAGNOSIS — I159 Secondary hypertension, unspecified: Secondary | ICD-10-CM

## 2021-01-07 DIAGNOSIS — F5104 Psychophysiologic insomnia: Secondary | ICD-10-CM

## 2021-01-09 ENCOUNTER — Telehealth: Payer: Self-pay | Admitting: Physician Assistant

## 2021-01-09 ENCOUNTER — Other Ambulatory Visit: Payer: Self-pay | Admitting: Nurse Practitioner

## 2021-01-09 DIAGNOSIS — F5104 Psychophysiologic insomnia: Secondary | ICD-10-CM

## 2021-01-09 MED ORDER — BELSOMRA 10 MG PO TABS: 10.0000 mg | ORAL_TABLET | Freq: Every evening | ORAL | 0 refills | Status: DC | PRN

## 2021-01-09 NOTE — Telephone Encounter (Signed)
I only filled this once for the patient while Herb Grays was out of the office. I have never seen him and this should be approved or denied by her.

## 2021-01-09 NOTE — Addendum Note (Signed)
Addended by: Lorrene Reid on: 01/09/2021 03:50 PM   Modules accepted: Orders

## 2021-01-09 NOTE — Telephone Encounter (Signed)
Patient called stating the sleep aid medication is not working and he would like to go back to what he was taking. AS, CMA

## 2021-01-09 NOTE — Telephone Encounter (Signed)
Patient is aware and verbalized understanding. AS, CMA

## 2021-01-18 ENCOUNTER — Other Ambulatory Visit: Payer: Self-pay | Admitting: Physician Assistant

## 2021-01-18 DIAGNOSIS — I159 Secondary hypertension, unspecified: Secondary | ICD-10-CM

## 2021-02-07 ENCOUNTER — Other Ambulatory Visit: Payer: Self-pay | Admitting: Physician Assistant

## 2021-02-07 DIAGNOSIS — F5104 Psychophysiologic insomnia: Secondary | ICD-10-CM

## 2021-02-25 ENCOUNTER — Other Ambulatory Visit: Payer: Self-pay | Admitting: Physician Assistant

## 2021-02-25 DIAGNOSIS — I159 Secondary hypertension, unspecified: Secondary | ICD-10-CM

## 2021-02-28 ENCOUNTER — Ambulatory Visit (INDEPENDENT_AMBULATORY_CARE_PROVIDER_SITE_OTHER): Payer: 59 | Admitting: Dermatology

## 2021-02-28 ENCOUNTER — Other Ambulatory Visit: Payer: Self-pay

## 2021-02-28 ENCOUNTER — Encounter: Payer: Self-pay | Admitting: Dermatology

## 2021-02-28 DIAGNOSIS — C44622 Squamous cell carcinoma of skin of right upper limb, including shoulder: Secondary | ICD-10-CM | POA: Diagnosis not present

## 2021-02-28 DIAGNOSIS — C4492 Squamous cell carcinoma of skin, unspecified: Secondary | ICD-10-CM

## 2021-03-08 ENCOUNTER — Other Ambulatory Visit: Payer: Self-pay | Admitting: Physician Assistant

## 2021-03-08 DIAGNOSIS — F5104 Psychophysiologic insomnia: Secondary | ICD-10-CM

## 2021-03-12 ENCOUNTER — Encounter: Payer: Self-pay | Admitting: Dermatology

## 2021-03-12 NOTE — Progress Notes (Signed)
   Follow-Up Visit   Subjective  [name] is a 65 y.o. male who presents for the following: Procedure (Here for treatment -right forearm- posterior Tora Duck- kelly in room with patent ).  Keratoacanthoma/SCCA right arm Location:  Duration:  Quality:  Associated Signs/Symptoms: Modifying Factors:  Severity:  Timing: Context:   Objective  Well appearing patient in no apparent distress; mood and affect are within normal limits.   A focused examination was performed including head, neck, arms.. Relevant physical exam findings are noted in the Assessment and Plan.   Assessment & Plan    SCC (squamous cell carcinoma) Right Forearm - Posterior  No treatment- lesion clear per Dr Denna Haggard.  I explained to that the keratoacanthoma type of skin cancer can spontaneously clear or be cleared by the biopsy.  If there were to be recurrence it typically occurs within 1 to 2 months he knows I will see him if there is any clinical change.      I, Darren Monarch, MD, have reviewed all documentation for this visit.  The documentation on 03/12/21 for the exam, diagnosis, procedures, and orders are all accurate and complete.

## 2021-03-22 ENCOUNTER — Other Ambulatory Visit: Payer: Self-pay | Admitting: Physician Assistant

## 2021-03-27 ENCOUNTER — Other Ambulatory Visit: Payer: Self-pay | Admitting: Physician Assistant

## 2021-03-27 DIAGNOSIS — F5104 Psychophysiologic insomnia: Secondary | ICD-10-CM

## 2021-04-01 ENCOUNTER — Other Ambulatory Visit: Payer: Self-pay | Admitting: Physician Assistant

## 2021-04-01 DIAGNOSIS — E785 Hyperlipidemia, unspecified: Secondary | ICD-10-CM

## 2021-04-01 DIAGNOSIS — R351 Nocturia: Secondary | ICD-10-CM

## 2021-04-01 DIAGNOSIS — F5104 Psychophysiologic insomnia: Secondary | ICD-10-CM

## 2021-04-05 ENCOUNTER — Other Ambulatory Visit: Payer: Self-pay | Admitting: Physician Assistant

## 2021-04-05 DIAGNOSIS — F5104 Psychophysiologic insomnia: Secondary | ICD-10-CM

## 2021-04-18 ENCOUNTER — Other Ambulatory Visit: Payer: Self-pay | Admitting: Physician Assistant

## 2021-04-18 DIAGNOSIS — I159 Secondary hypertension, unspecified: Secondary | ICD-10-CM

## 2021-04-19 ENCOUNTER — Other Ambulatory Visit: Payer: Self-pay | Admitting: Physician Assistant

## 2021-04-23 ENCOUNTER — Other Ambulatory Visit: Payer: Self-pay | Admitting: Physician Assistant

## 2021-04-23 DIAGNOSIS — F5104 Psychophysiologic insomnia: Secondary | ICD-10-CM

## 2021-05-03 ENCOUNTER — Telehealth: Payer: Self-pay | Admitting: Physician Assistant

## 2021-05-03 NOTE — Telephone Encounter (Signed)
Per Herb Grays advised patient we were unable to give another refill until he has an appointment. Pt verbalized understanding and agreed to call back to schedule. AS, CMA

## 2021-05-03 NOTE — Telephone Encounter (Signed)
Patient called and requested a refill on the Trazadone. He had to cancel his appointment for Monday November 21st due to lack of transportation. [phone]

## 2021-05-06 ENCOUNTER — Ambulatory Visit: Payer: 59 | Admitting: Physician Assistant

## 2021-05-17 ENCOUNTER — Other Ambulatory Visit: Payer: Self-pay | Admitting: Physician Assistant

## 2021-05-23 ENCOUNTER — Encounter: Payer: Self-pay | Admitting: Physician Assistant

## 2021-05-23 ENCOUNTER — Ambulatory Visit (INDEPENDENT_AMBULATORY_CARE_PROVIDER_SITE_OTHER): Payer: 59 | Admitting: Physician Assistant

## 2021-05-23 ENCOUNTER — Other Ambulatory Visit: Payer: Self-pay

## 2021-05-23 VITALS — BP 107/69 | HR 86 | Temp 98.9°F | Wt 215.0 lb

## 2021-05-23 DIAGNOSIS — I159 Secondary hypertension, unspecified: Secondary | ICD-10-CM | POA: Diagnosis not present

## 2021-05-23 DIAGNOSIS — R351 Nocturia: Secondary | ICD-10-CM

## 2021-05-23 DIAGNOSIS — F5104 Psychophysiologic insomnia: Secondary | ICD-10-CM

## 2021-05-23 MED ORDER — TAMSULOSIN HCL 0.4 MG PO CAPS: 0.8000 mg | ORAL_CAPSULE | Freq: Every day | ORAL | 0 refills | Status: DC

## 2021-05-23 MED ORDER — BELSOMRA 15 MG PO TABS: 15.0000 mg | ORAL_TABLET | Freq: Every evening | ORAL | 0 refills | Status: DC | PRN

## 2021-05-23 NOTE — Assessment & Plan Note (Signed)
-  Discussed with patient treatment adjustments and agreeable to increase Belsomra to 15 mg. Advised to let me know if unable to tolerate increased dose. -Will continue to monitor.
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
## 2021-05-23 NOTE — Assessment & Plan Note (Signed)
-  Stable. -Continue current medication regimen.  -Will continue to monitor. 

## 2021-05-31 ENCOUNTER — Other Ambulatory Visit: Payer: Self-pay | Admitting: Physician Assistant

## 2021-05-31 DIAGNOSIS — F5104 Psychophysiologic insomnia: Secondary | ICD-10-CM

## 2021-06-11 ENCOUNTER — Other Ambulatory Visit: Payer: Self-pay | Admitting: Physician Assistant

## 2021-06-19 ENCOUNTER — Other Ambulatory Visit: Payer: Self-pay | Admitting: Physician Assistant

## 2021-06-21 ENCOUNTER — Other Ambulatory Visit: Payer: Self-pay | Admitting: Physician Assistant

## 2021-06-21 DIAGNOSIS — F5104 Psychophysiologic insomnia: Secondary | ICD-10-CM

## 2021-06-28 ENCOUNTER — Other Ambulatory Visit: Payer: Self-pay | Admitting: Physician Assistant

## 2021-06-28 DIAGNOSIS — E785 Hyperlipidemia, unspecified: Secondary | ICD-10-CM

## 2021-06-28 DIAGNOSIS — R351 Nocturia: Secondary | ICD-10-CM

## 2021-07-04 ENCOUNTER — Other Ambulatory Visit: Payer: Self-pay | Admitting: Physician Assistant

## 2021-07-04 DIAGNOSIS — I159 Secondary hypertension, unspecified: Secondary | ICD-10-CM

## 2021-07-05 ENCOUNTER — Other Ambulatory Visit: Payer: Self-pay | Admitting: Physician Assistant

## 2021-07-17 ENCOUNTER — Other Ambulatory Visit: Payer: Self-pay | Admitting: Physician Assistant

## 2021-07-17 DIAGNOSIS — I159 Secondary hypertension, unspecified: Secondary | ICD-10-CM

## 2021-07-17 DIAGNOSIS — F5104 Psychophysiologic insomnia: Secondary | ICD-10-CM

## 2021-07-24 ENCOUNTER — Telehealth: Payer: Self-pay | Admitting: Physician Assistant

## 2021-07-24 DIAGNOSIS — N400 Enlarged prostate without lower urinary tract symptoms: Secondary | ICD-10-CM

## 2021-07-24 NOTE — Telephone Encounter (Signed)
Referral has been placed. AS, CMA 

## 2021-07-24 NOTE — Telephone Encounter (Signed)
Patient states he is having incontinence issues and urinary frequency. He is taking the flomax but it is not helping. AS, CMA
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
## 2021-08-14 ENCOUNTER — Ambulatory Visit: Payer: 59 | Admitting: Urology

## 2021-08-15 ENCOUNTER — Encounter: Payer: Self-pay | Admitting: Urology

## 2021-08-21 ENCOUNTER — Other Ambulatory Visit: Payer: Self-pay | Admitting: Physician Assistant

## 2021-08-21 DIAGNOSIS — R351 Nocturia: Secondary | ICD-10-CM

## 2021-09-04 ENCOUNTER — Other Ambulatory Visit: Payer: Self-pay | Admitting: Physician Assistant

## 2021-09-17 ENCOUNTER — Ambulatory Visit: Payer: 59 | Admitting: Dermatology

## 2021-09-20 ENCOUNTER — Other Ambulatory Visit: Payer: Self-pay | Admitting: Physician Assistant

## 2021-09-20 DIAGNOSIS — F5104 Psychophysiologic insomnia: Secondary | ICD-10-CM

## 2021-09-26 ENCOUNTER — Other Ambulatory Visit: Payer: Self-pay | Admitting: Physician Assistant

## 2021-09-26 DIAGNOSIS — E785 Hyperlipidemia, unspecified: Secondary | ICD-10-CM

## 2021-09-30 ENCOUNTER — Encounter: Payer: 59 | Admitting: Physician Assistant

## 2021-11-20 ENCOUNTER — Other Ambulatory Visit: Payer: Self-pay | Admitting: Physician Assistant

## 2021-11-20 DIAGNOSIS — R351 Nocturia: Secondary | ICD-10-CM

## 2021-11-22 ENCOUNTER — Other Ambulatory Visit: Payer: Self-pay | Admitting: Physician Assistant

## 2021-11-22 DIAGNOSIS — F5104 Psychophysiologic insomnia: Secondary | ICD-10-CM

## 2021-12-02 ENCOUNTER — Other Ambulatory Visit: Payer: Self-pay | Admitting: Adult Health

## 2021-12-02 DIAGNOSIS — G40409 Other generalized epilepsy and epileptic syndromes, not intractable, without status epilepticus: Secondary | ICD-10-CM

## 2021-12-06 ENCOUNTER — Emergency Department (HOSPITAL_COMMUNITY)
Admission: EM | Admit: 2021-12-06 | Discharge: 2021-12-06 | Discharge status: Against Medical Advice | Payer: 59 | Attending: Emergency Medicine | Admitting: Emergency Medicine

## 2021-12-06 ENCOUNTER — Encounter (HOSPITAL_COMMUNITY): Payer: Self-pay | Admitting: Emergency Medicine

## 2021-12-06 ENCOUNTER — Other Ambulatory Visit: Payer: Self-pay

## 2021-12-06 DIAGNOSIS — Z5329 Procedure and treatment not carried out because of patient's decision for other reasons: Secondary | ICD-10-CM | POA: Insufficient documentation

## 2021-12-06 DIAGNOSIS — I509 Heart failure, unspecified: Secondary | ICD-10-CM | POA: Diagnosis not present

## 2021-12-06 DIAGNOSIS — J439 Emphysema, unspecified: Secondary | ICD-10-CM | POA: Insufficient documentation

## 2021-12-06 DIAGNOSIS — Z79899 Other long term (current) drug therapy: Secondary | ICD-10-CM | POA: Insufficient documentation

## 2021-12-06 DIAGNOSIS — I110 Hypertensive heart disease with heart failure: Secondary | ICD-10-CM | POA: Diagnosis not present

## 2021-12-06 DIAGNOSIS — R569 Unspecified convulsions: Secondary | ICD-10-CM

## 2021-12-06 DIAGNOSIS — Z7951 Long term (current) use of inhaled steroids: Secondary | ICD-10-CM | POA: Insufficient documentation

## 2021-12-06 DIAGNOSIS — I2510 Atherosclerotic heart disease of native coronary artery without angina pectoris: Secondary | ICD-10-CM | POA: Diagnosis not present

## 2021-12-06 DIAGNOSIS — Z85828 Personal history of other malignant neoplasm of skin: Secondary | ICD-10-CM | POA: Insufficient documentation

## 2021-12-06 DIAGNOSIS — G40909 Epilepsy, unspecified, not intractable, without status epilepticus: Secondary | ICD-10-CM | POA: Diagnosis present

## 2021-12-06 LAB — CBC
HCT: 40.7 % (ref 39.0–52.0)
Hemoglobin: 14 g/dL (ref 13.0–17.0)
MCH: 32.9 pg (ref 26.0–34.0)
MCHC: 34.4 g/dL (ref 30.0–36.0)
MCV: 95.5 fL (ref 80.0–100.0)
Platelets: 94 10*3/uL — ABNORMAL LOW (ref 150–400)
RBC: 4.26 MIL/uL (ref 4.22–5.81)
RDW: 13.2 % (ref 11.5–15.5)
WBC: 6.5 10*3/uL (ref 4.0–10.5)
nRBC: 0 % (ref 0.0–0.2)

## 2021-12-06 LAB — COMPREHENSIVE METABOLIC PANEL
ALT: 30 U/L (ref 0–44)
AST: 35 U/L (ref 15–41)
Albumin: 4.2 g/dL (ref 3.5–5.0)
Alkaline Phosphatase: 57 U/L (ref 38–126)
Anion gap: 10 (ref 5–15)
BUN: 14 mg/dL (ref 8–23)
CO2: 27 mmol/L (ref 22–32)
Calcium: 9.5 mg/dL (ref 8.9–10.3)
Chloride: 102 mmol/L (ref 98–111)
Creatinine, Ser: 0.86 mg/dL (ref 0.61–1.24)
GFR, Estimated: >60 mL/min (ref >60)
Glucose, Bld: 133 mg/dL — ABNORMAL HIGH (ref 70–99)
Potassium: 3.7 mmol/L (ref 3.5–5.1)
Sodium: 139 mmol/L (ref 135–145)
Total Bilirubin: 0.9 mg/dL (ref 0.3–1.2)
Total Protein: 6.6 g/dL (ref 6.5–8.1)

## 2021-12-06 MED ORDER — ALBUTEROL SULFATE HFA 108 (90 BASE) MCG/ACT IN AERS
4.0000 | INHALATION_SPRAY | Freq: Once | RESPIRATORY_TRACT | Status: AC
  Administered 2021-12-06: 4 via RESPIRATORY_TRACT
  Filled 2021-12-06: qty 6.7

## 2021-12-06 MED ORDER — DIVALPROEX SODIUM 250 MG PO DR TAB
500.0000 mg | DELAYED_RELEASE_TABLET | Freq: Once | ORAL | Status: AC
  Administered 2021-12-06: 500 mg via ORAL
  Filled 2021-12-06: qty 2

## 2021-12-11 ENCOUNTER — Emergency Department (HOSPITAL_COMMUNITY): Payer: 59

## 2021-12-11 ENCOUNTER — Other Ambulatory Visit: Payer: Self-pay

## 2021-12-11 ENCOUNTER — Ambulatory Visit (INDEPENDENT_AMBULATORY_CARE_PROVIDER_SITE_OTHER): Payer: 59 | Admitting: Adult Health

## 2021-12-11 ENCOUNTER — Encounter (HOSPITAL_COMMUNITY): Payer: Self-pay

## 2021-12-11 ENCOUNTER — Emergency Department (HOSPITAL_COMMUNITY)
Admission: EM | Admit: 2021-12-11 | Discharge: 2021-12-11 | Disposition: A | Discharge status: Home / Self Care | Payer: 59 | Attending: Emergency Medicine | Admitting: Emergency Medicine

## 2021-12-11 ENCOUNTER — Encounter: Payer: Self-pay | Admitting: Adult Health

## 2021-12-11 DIAGNOSIS — W06XXXA Fall from bed, initial encounter: Secondary | ICD-10-CM | POA: Insufficient documentation

## 2021-12-11 DIAGNOSIS — Z79899 Other long term (current) drug therapy: Secondary | ICD-10-CM | POA: Diagnosis not present

## 2021-12-11 DIAGNOSIS — I509 Heart failure, unspecified: Secondary | ICD-10-CM | POA: Diagnosis not present

## 2021-12-11 DIAGNOSIS — Z85828 Personal history of other malignant neoplasm of skin: Secondary | ICD-10-CM | POA: Insufficient documentation

## 2021-12-11 DIAGNOSIS — I110 Hypertensive heart disease with heart failure: Secondary | ICD-10-CM | POA: Diagnosis not present

## 2021-12-11 DIAGNOSIS — G40409 Other generalized epilepsy and epileptic syndromes, not intractable, without status epilepticus: Secondary | ICD-10-CM | POA: Diagnosis not present

## 2021-12-11 DIAGNOSIS — F1290 Cannabis use, unspecified, uncomplicated: Secondary | ICD-10-CM | POA: Diagnosis not present

## 2021-12-11 DIAGNOSIS — R569 Unspecified convulsions: Secondary | ICD-10-CM | POA: Diagnosis not present

## 2021-12-11 DIAGNOSIS — Z91148 Patient's other noncompliance with medication regimen for other reason: Secondary | ICD-10-CM | POA: Diagnosis not present

## 2021-12-11 DIAGNOSIS — S0083XA Contusion of other part of head, initial encounter: Secondary | ICD-10-CM | POA: Diagnosis not present

## 2021-12-11 DIAGNOSIS — F17290 Nicotine dependence, other tobacco product, uncomplicated: Secondary | ICD-10-CM | POA: Diagnosis not present

## 2021-12-11 DIAGNOSIS — S0990XA Unspecified injury of head, initial encounter: Secondary | ICD-10-CM | POA: Diagnosis present

## 2021-12-11 LAB — COMPREHENSIVE METABOLIC PANEL
ALT: 32 U/L (ref 0–44)
AST: 37 U/L (ref 15–41)
Albumin: 4.1 g/dL (ref 3.5–5.0)
Alkaline Phosphatase: 60 U/L (ref 38–126)
Anion gap: 7 (ref 5–15)
BUN: 13 mg/dL (ref 8–23)
CO2: 26 mmol/L (ref 22–32)
Calcium: 9.5 mg/dL (ref 8.9–10.3)
Chloride: 105 mmol/L (ref 98–111)
Creatinine, Ser: 0.87 mg/dL (ref 0.61–1.24)
GFR, Estimated: >60 mL/min (ref >60)
Glucose, Bld: 134 mg/dL — ABNORMAL HIGH (ref 70–99)
Potassium: 3.7 mmol/L (ref 3.5–5.1)
Sodium: 138 mmol/L (ref 135–145)
Total Bilirubin: 0.7 mg/dL (ref 0.3–1.2)
Total Protein: 6.6 g/dL (ref 6.5–8.1)

## 2021-12-11 LAB — CBG MONITORING, ED: Glucose-Capillary: 157 mg/dL — ABNORMAL HIGH (ref 70–99)

## 2021-12-11 LAB — CBC WITH DIFFERENTIAL/PLATELET
Abs Immature Granulocytes: 0.02 10*3/uL (ref 0.00–0.07)
Basophils Absolute: 0 10*3/uL (ref 0.0–0.1)
Basophils Relative: 1 %
Eosinophils Absolute: 0.1 10*3/uL (ref 0.0–0.5)
Eosinophils Relative: 2 %
HCT: 39.8 % (ref 39.0–52.0)
Hemoglobin: 13.7 g/dL (ref 13.0–17.0)
Immature Granulocytes: 0 %
Lymphocytes Relative: 18 %
Lymphs Abs: 1.1 10*3/uL (ref 0.7–4.0)
MCH: 33 pg (ref 26.0–34.0)
MCHC: 34.4 g/dL (ref 30.0–36.0)
MCV: 95.9 fL (ref 80.0–100.0)
Monocytes Absolute: 0.6 10*3/uL (ref 0.1–1.0)
Monocytes Relative: 10 %
Neutro Abs: 4.3 10*3/uL (ref 1.7–7.7)
Neutrophils Relative %: 69 %
Platelets: 93 10*3/uL — ABNORMAL LOW (ref 150–400)
RBC: 4.15 MIL/uL — ABNORMAL LOW (ref 4.22–5.81)
RDW: 13.3 % (ref 11.5–15.5)
WBC: 6.2 10*3/uL (ref 4.0–10.5)
nRBC: 0 % (ref 0.0–0.2)

## 2021-12-11 LAB — RAPID URINE DRUG SCREEN, HOSP PERFORMED
Amphetamines: NOT DETECTED
Barbiturates: NOT DETECTED
Benzodiazepines: NOT DETECTED
Cocaine: NOT DETECTED
Opiates: NOT DETECTED
Tetrahydrocannabinol: POSITIVE — AB

## 2021-12-11 LAB — MAGNESIUM: Magnesium: 2.4 mg/dL (ref 1.7–2.4)

## 2021-12-11 LAB — VALPROIC ACID LEVEL: Valproic Acid Lvl: 116 ug/mL — ABNORMAL HIGH (ref 50.0–100.0)

## 2021-12-11 LAB — CK: Total CK: 767 U/L — ABNORMAL HIGH (ref 49–397)

## 2021-12-11 LAB — ETHANOL: Alcohol, Ethyl (B): <10 mg/dL (ref <10)

## 2021-12-11 MED ORDER — DIVALPROEX SODIUM 500 MG PO DR TAB: DELAYED_RELEASE_TABLET | ORAL | 3 refills | Status: DC

## 2021-12-11 MED ORDER — ACETAMINOPHEN 325 MG PO TABS
650.0000 mg | ORAL_TABLET | Freq: Once | ORAL | Status: AC
  Administered 2021-12-11: 650 mg via ORAL
  Filled 2021-12-11: qty 2

## 2021-12-11 MED ORDER — DIVALPROEX SODIUM 500 MG PO DR TAB
500.0000 mg | DELAYED_RELEASE_TABLET | Freq: Once | ORAL | Status: AC
  Administered 2021-12-11: 500 mg via ORAL
  Filled 2021-12-11: qty 1

## 2021-12-11 MED ORDER — KETOROLAC TROMETHAMINE 15 MG/ML IJ SOLN
15.0000 mg | Freq: Once | INTRAMUSCULAR | Status: AC
  Administered 2021-12-11: 15 mg via INTRAVENOUS
  Filled 2021-12-11: qty 1

## 2021-12-11 NOTE — ED Triage Notes (Signed)
Patient BIB EMS from home. Patient family states patient had grand mal seizure lasting about 10-15 minutes. Patient has been off Depakote for about a month. Patient was seen by neurologist today for a refill of prescription. Patient has only had one dose of medication today. Patient has some facial trama due to seizure prior to neurologist visit   Vital signs were 120/72 96% on room air
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
## 2021-12-11 NOTE — Discharge Instructions (Addendum)
Per [place] DMV statutes, patients with seizures are not allowed to drive until they have been seizure-free for six months.  Other recommendations include using caution when using heavy equipment or power tools. Avoid working on ladders or at heights. Take showers instead of baths.  Do not swim alone.  Ensure the water temperature is not too high on the home water heater. Do not go swimming alone. Do not lock yourself in a room alone (i.e. bathroom). When caring for infants or small children, sit down when holding, feeding, or changing them to minimize risk of injury to the child in the event you have a seizure. Maintain good sleep hygiene. Avoid alcohol.  Also recommend adequate sleep, hydration, good diet and minimize stress.     During the Seizure   - First, ensure adequate ventilation and place patients on the floor on their left side  Loosen clothing around the neck and ensure the airway is patent. If the patient is clenching the teeth, do not force the mouth open with any object as this can cause severe damage - Remove all items from the surrounding that can be hazardous. The patient may be oblivious to what's happening and may not even know what he or she is doing. If the patient is confused and wandering, either gently guide him/her away and block access to outside areas - Reassure the individual and be comforting - Call 911. In most cases, the seizure ends before EMS arrives. However, there are cases when seizures may last over 3 to 5 minutes. Or the individual may have developed breathing difficulties or severe injuries. If a pregnant patient or a person with diabetes develops a seizure, it is prudent to call an ambulance. - Finally, if the patient does not regain full consciousness, then call EMS. Most patients will remain confused for about 45 to 90 minutes after a seizure, so you must use judgment in calling for help. - Avoid restraints but make sure the patient is in a bed with  padded side rails - Place the individual in a lateral position with the neck slightly flexed; this will help the saliva drain from the mouth and prevent the tongue from falling backward - Remove all nearby furniture and other hazards from the area - Provide verbal assurance as the individual is regaining consciousness - Provide the patient with privacy if possible - Call for help and start treatment as ordered by the caregiver   After the Seizure (Postictal Stage)   After a seizure, most patients experience confusion, fatigue, muscle pain and/or a headache. Thus, one should permit the individual to sleep. For the next few days, reassurance is essential. Being calm and helping reorient the person is also of importance.   Most seizures are painless and end spontaneously. Seizures are not harmful to others but can lead to complications such as stress on the lungs, brain and the heart. Individuals with prior lung problems may develop labored breathing and respiratory distress.    

## 2021-12-11 NOTE — Patient Instructions (Signed)
Your Plan:   Restart Depakote 500 mg three times a day No driving until seizure free for 6 months- including scooter If your symptoms worsen or you develop new symptoms please let us know.    Thank you for coming to see [place] at [facility]. I hope we have been able to provide you high quality care today.  You may receive a patient satisfaction survey over the next few weeks. We would appreciate your feedback and comments so that we may continue to improve ourselves and the health of our patients.

## 2021-12-18 ENCOUNTER — Other Ambulatory Visit: Payer: Self-pay

## 2021-12-18 ENCOUNTER — Emergency Department (HOSPITAL_BASED_OUTPATIENT_CLINIC_OR_DEPARTMENT_OTHER)
Admission: EM | Admit: 2021-12-18 | Discharge: 2021-12-19 | Disposition: A | Discharge status: Home / Self Care | Payer: 59 | Attending: Emergency Medicine | Admitting: Emergency Medicine

## 2021-12-18 ENCOUNTER — Encounter (HOSPITAL_BASED_OUTPATIENT_CLINIC_OR_DEPARTMENT_OTHER): Payer: Self-pay

## 2021-12-18 DIAGNOSIS — Z79899 Other long term (current) drug therapy: Secondary | ICD-10-CM | POA: Insufficient documentation

## 2021-12-18 DIAGNOSIS — I509 Heart failure, unspecified: Secondary | ICD-10-CM | POA: Insufficient documentation

## 2021-12-18 DIAGNOSIS — Z85828 Personal history of other malignant neoplasm of skin: Secondary | ICD-10-CM | POA: Diagnosis not present

## 2021-12-18 DIAGNOSIS — G4700 Insomnia, unspecified: Secondary | ICD-10-CM | POA: Diagnosis not present

## 2021-12-18 DIAGNOSIS — I110 Hypertensive heart disease with heart failure: Secondary | ICD-10-CM | POA: Diagnosis not present

## 2021-12-18 DIAGNOSIS — F5104 Psychophysiologic insomnia: Secondary | ICD-10-CM

## 2021-12-18 DIAGNOSIS — F1210 Cannabis abuse, uncomplicated: Secondary | ICD-10-CM | POA: Insufficient documentation

## 2021-12-18 LAB — COMPREHENSIVE METABOLIC PANEL
ALT: 15 U/L (ref 0–44)
AST: 16 U/L (ref 15–41)
Albumin: 4.4 g/dL (ref 3.5–5.0)
Alkaline Phosphatase: 48 U/L (ref 38–126)
Anion gap: 10 (ref 5–15)
BUN: 17 mg/dL (ref 8–23)
CO2: 29 mmol/L (ref 22–32)
Calcium: 9.9 mg/dL (ref 8.9–10.3)
Chloride: 104 mmol/L (ref 98–111)
Creatinine, Ser: 0.76 mg/dL (ref 0.61–1.24)
GFR, Estimated: >60 mL/min (ref >60)
Glucose, Bld: 96 mg/dL (ref 70–99)
Potassium: 4.4 mmol/L (ref 3.5–5.1)
Sodium: 143 mmol/L (ref 135–145)
Total Bilirubin: 0.6 mg/dL (ref 0.3–1.2)
Total Protein: 6.8 g/dL (ref 6.5–8.1)

## 2021-12-18 LAB — CBC WITH DIFFERENTIAL/PLATELET
Abs Immature Granulocytes: 0.02 10*3/uL (ref 0.00–0.07)
Basophils Absolute: 0 10*3/uL (ref 0.0–0.1)
Basophils Relative: 0 %
Eosinophils Absolute: 0.1 10*3/uL (ref 0.0–0.5)
Eosinophils Relative: 2 %
HCT: 41.2 % (ref 39.0–52.0)
Hemoglobin: 14.3 g/dL (ref 13.0–17.0)
Immature Granulocytes: 0 %
Lymphocytes Relative: 31 %
Lymphs Abs: 1.7 10*3/uL (ref 0.7–4.0)
MCH: 32.4 pg (ref 26.0–34.0)
MCHC: 34.7 g/dL (ref 30.0–36.0)
MCV: 93.4 fL (ref 80.0–100.0)
Monocytes Absolute: 0.5 10*3/uL (ref 0.1–1.0)
Monocytes Relative: 8 %
Neutro Abs: 3.3 10*3/uL (ref 1.7–7.7)
Neutrophils Relative %: 59 %
Platelets: 91 10*3/uL — ABNORMAL LOW (ref 150–400)
RBC: 4.41 MIL/uL (ref 4.22–5.81)
RDW: 13 % (ref 11.5–15.5)
WBC: 5.7 10*3/uL (ref 4.0–10.5)
nRBC: 0 % (ref 0.0–0.2)

## 2021-12-18 MED ORDER — BELSOMRA 15 MG PO TABS: 1.0000 | ORAL_TABLET | Freq: Every evening | ORAL | 0 refills | Status: DC | PRN

## 2021-12-18 MED ORDER — LORAZEPAM 2 MG/ML IJ SOLN
1.0000 mg | Freq: Once | INTRAMUSCULAR | Status: AC
Start: 2021-12-18 — End: 2021-12-18
  Administered 2021-12-18: 1 mg via INTRAVENOUS
  Filled 2021-12-18: qty 1

## 2021-12-18 NOTE — ED Notes (Signed)
Pt oob ambulating to hall bathroom independently - gait steady; no acute distress noted.  Family at bedside.

## 2021-12-18 NOTE — ED Triage Notes (Signed)
Patient here POV from Home with Family.  Endorses having a Seizure and CVA approximately 1 week ago. Since then the Patient has not been able to Sleep. No Seizures since the Previous.   OTC Medications have not been helpful. No Pain. Patient has been taking Depakote as prescribed since last Seizure.   NAD Noted during Triage. A&Ox4. GCS 15. BIB Wheelchair.
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
## 2021-12-19 ENCOUNTER — Other Ambulatory Visit: Payer: Self-pay | Admitting: Physician Assistant

## 2021-12-19 ENCOUNTER — Other Ambulatory Visit: Payer: Self-pay | Admitting: Nurse Practitioner

## 2021-12-19 DIAGNOSIS — F5104 Psychophysiologic insomnia: Secondary | ICD-10-CM

## 2021-12-19 DIAGNOSIS — I159 Secondary hypertension, unspecified: Secondary | ICD-10-CM

## 2021-12-19 DIAGNOSIS — G4700 Insomnia, unspecified: Secondary | ICD-10-CM | POA: Diagnosis not present

## 2021-12-19 LAB — URINALYSIS, ROUTINE W REFLEX MICROSCOPIC
Bilirubin Urine: NEGATIVE
Glucose, UA: NEGATIVE mg/dL
Hgb urine dipstick: NEGATIVE
Ketones, ur: NEGATIVE mg/dL
Leukocytes,Ua: NEGATIVE
Nitrite: NEGATIVE
Protein, ur: NEGATIVE mg/dL
Specific Gravity, Urine: 1.02 (ref 1.005–1.030)
pH: 6 (ref 5.0–8.0)

## 2021-12-19 LAB — RAPID URINE DRUG SCREEN, HOSP PERFORMED
Amphetamines: NOT DETECTED
Barbiturates: NOT DETECTED
Benzodiazepines: NOT DETECTED
Cocaine: NOT DETECTED
Opiates: NOT DETECTED
Tetrahydrocannabinol: POSITIVE — AB

## 2021-12-19 MED ORDER — BELSOMRA 15 MG PO TABS: 1.0000 | ORAL_TABLET | Freq: Every evening | ORAL | 0 refills | Status: DC | PRN

## 2021-12-19 NOTE — ED Notes (Signed)
Pt resting in bed with eyes closed; RR even and unlabored on RA.  Continuous cardiac and pulse ox maintained.  Pt aroused by ED tech for urine to be collected

## 2021-12-24 ENCOUNTER — Ambulatory Visit (INDEPENDENT_AMBULATORY_CARE_PROVIDER_SITE_OTHER): Payer: 59 | Admitting: Nurse Practitioner

## 2021-12-24 ENCOUNTER — Encounter: Payer: Self-pay | Admitting: Nurse Practitioner

## 2021-12-24 VITALS — BP 125/75 | HR 63 | Ht 68.11 in | Wt 209.1 lb

## 2021-12-24 DIAGNOSIS — R4182 Altered mental status, unspecified: Secondary | ICD-10-CM

## 2021-12-24 DIAGNOSIS — Z09 Encounter for follow-up examination after completed treatment for conditions other than malignant neoplasm: Secondary | ICD-10-CM | POA: Diagnosis not present

## 2021-12-24 DIAGNOSIS — J449 Chronic obstructive pulmonary disease, unspecified: Secondary | ICD-10-CM

## 2021-12-24 DIAGNOSIS — G40909 Epilepsy, unspecified, not intractable, without status epilepticus: Secondary | ICD-10-CM

## 2021-12-24 DIAGNOSIS — F411 Generalized anxiety disorder: Secondary | ICD-10-CM

## 2021-12-24 DIAGNOSIS — F5104 Psychophysiologic insomnia: Secondary | ICD-10-CM

## 2021-12-24 NOTE — Progress Notes (Signed)
Established patient visit   Patient: [name]   DOB: 1955/08/24   66 y.o. Male  MRN: [id number] Visit Date: 12/24/2021   Chief Complaint  Patient presents with   Hospitalization Follow-up   Subjective    HPI  Emergency department follow up -insomnia -had been trying melatonin without relief.  -in ER, he was positive for THC. -was given ativan 1 mg ad this did help him sleep.  -historically, had been prescribed belsomra 15 mg tablets. He did get a prescription for #10 tablets to last until this visit.  -history of seizures. Had a few visits to ER due to breakthrough seizures. Does seen neurology provider.   Medications: Outpatient Medications Prior to Visit  Medication Sig   albuterol (PROVENTIL) (2.5 MG/3ML) 0.083% nebulizer solution Take 3 mLs (2.5 mg total) by nebulization every 6 (six) hours as needed for wheezing or shortness of breath.   albuterol (VENTOLIN HFA) 108 (90 Base) MCG/ACT inhaler INHALE 2 PUFFS INTO THE LUNGS EVERY 6 HOURS AS NEEDED FOR WHEEZING OR SHORTNESS OF BREATH. (Patient taking differently: Inhale 2 puffs into the lungs every 6 (six) hours as needed for wheezing or shortness of breath.)   budesonide-formoterol (SYMBICORT) 80-4.5 MCG/ACT inhaler Inhale 2 puffs into the lungs 2 (two) times daily. (Patient not taking: Reported on 12/30/2021)   busPIRone (BUSPAR) 15 MG tablet Take 15 mg by mouth 3 (three) times daily.   hydrOXYzine (ATARAX/VISTARIL) 10 MG tablet TAKE 1 TABLET BY MOUTH 3 TIMES DAILY AS NEEDED. (Patient taking differently: Take 10 mg by mouth 3 (three) times daily as needed for anxiety.)   NITROSTAT 0.4 MG SL tablet PLACE 1 TABLET UNDER THE TONGUE EVERY 5 MINUTES AS NEEDED FOR CHEST PAIN FOR UP TO 3 DOSES. (Patient not taking: Reported on 12/30/2021)   OXcarbazepine (TRILEPTAL) 150 MG tablet Take 150 mg by mouth 2 (two) times daily.   QUEtiapine (SEROQUEL XR) 400 MG 24 hr tablet Take 400 mg by mouth at bedtime.   sertraline (ZOLOFT) 100 MG  tablet Take 200 mg by mouth daily.   Suvorexant (BELSOMRA) 15 MG TABS Take 1 tablet by mouth at bedtime as needed. (Patient taking differently: Take 1 tablet by mouth at bedtime as needed (sleep).)   tamsulosin (FLOMAX) 0.4 MG CAPS capsule TAKE 2 CAPSULES BY MOUTH DAILY. (Patient taking differently: Take 0.8 mg by mouth daily.)   [DISCONTINUED] atorvastatin (LIPITOR) 20 MG tablet TAKE 1 TABLET (20 MG TOTAL) BY MOUTH AT BEDTIME.   [DISCONTINUED] B Complex-C (SUPER B COMPLEX/VITAMIN C PO) Take 1 tablet by mouth daily. (Patient not taking: Reported on 12/26/2021)   [DISCONTINUED] Cholecalciferol (VITAMIN D3) 125 MCG (5000 UT) CAPS Take 1 capsule by mouth 3 (three) times daily.  (Patient not taking: Reported on 12/26/2021)   [DISCONTINUED] divalproex (DEPAKOTE) 500 MG DR tablet TAKE 1 TABLET (500 MG TOTAL) BY MOUTH 3 TIMES DAILY.   [DISCONTINUED] lisinopril (ZESTRIL) 5 MG tablet TAKE 1 TABLET BY MOUTH DAILY.   [DISCONTINUED] metoprolol succinate (TOPROL-XL) 25 MG 24 hr tablet TAKE 1/2 TABLET BY MOUTH DAILY. (Patient taking differently: Take 12.5 mg by mouth daily.)   No facility-administered medications prior to visit.    Review of Systems  Constitutional:  Positive for activity change. Negative for chills, fatigue and fever.  HENT:  Negative for congestion, postnasal drip, rhinorrhea, sinus pressure, sinus pain, sneezing and sore throat.   Eyes: Negative.   Respiratory:  Negative for cough, shortness of breath and wheezing.   Cardiovascular:  Negative for chest pain  and palpitations.  Gastrointestinal:  Negative for constipation, diarrhea, nausea and vomiting.  Endocrine: Negative for cold intolerance, heat intolerance, polydipsia and polyuria.  Genitourinary:  Negative for dysuria, frequency and urgency.  Musculoskeletal:  Positive for gait problem. Negative for back pain and myalgias.  Skin:  Negative for rash.  Allergic/Immunologic: Negative for environmental allergies.  Neurological:   Positive for tremors, seizures, speech difficulty and light-headedness. Negative for dizziness, weakness and headaches.  Psychiatric/Behavioral:  Positive for behavioral problems, confusion, dysphoric mood and sleep disturbance. The patient is not nervous/anxious.     Last CBC Lab Results  Component Value Date   WBC 2.9 (L) 01/02/2022   HGB 11.1 (L) 01/02/2022   HCT 32.8 (L) 01/02/2022   MCV 95.9 01/02/2022   MCH 32.5 01/02/2022   RDW 13.8 01/02/2022   PLT 84 (L) 43/32/9518   Last metabolic panel Lab Results  Component Value Date   GLUCOSE 104 (H) 01/02/2022   NA 142 01/02/2022   K 3.6 01/02/2022   CL 110 01/02/2022   CO2 25 01/02/2022   BUN 9 01/02/2022   CREATININE 0.69 01/02/2022   GFRNONAA >60 01/02/2022   CALCIUM 8.5 (L) 01/02/2022   PHOS 3.3 01/01/2022   PROT 5.4 (L) 01/02/2022   ALBUMIN 2.8 (L) 01/02/2022   LABGLOB 1.9 07/26/2020   AGRATIO 2.6 (H) 07/26/2020   BILITOT 0.6 01/02/2022   ALKPHOS 40 01/02/2022   AST 35 01/02/2022   ALT 33 01/02/2022   ANIONGAP 7 01/02/2022   Last lipids Lab Results  Component Value Date   CHOL 212 (H) 07/26/2020   HDL 34 (L) 07/26/2020   LDLCALC 144 (H) 07/26/2020   TRIG 187 (H) 07/26/2020   CHOLHDL 6.2 (H) 07/26/2020   Last hemoglobin A1c Lab Results  Component Value Date   HGBA1C 5.2 07/26/2020   Last thyroid functions Lab Results  Component Value Date   TSH 3.515 12/25/2021         Objective     Today's Vitals   12/24/21 1420  BP: 125/75  Pulse: 63  SpO2: 92%  Weight: 209 lb 1.9 oz (94.9 kg)  Height: 5' 8.11" (1.73 m)   Body mass index is 31.69 kg/m.   BP Readings from Last 3 Encounters:  01/08/22 112/77  01/02/22 126/79  12/27/21 108/74    Wt Readings from Last 3 Encounters:  12/31/21 201 lb 11.5 oz (91.5 kg)  12/25/21 208 lb 15.9 oz (94.8 kg)  12/24/21 209 lb 1.9 oz (94.9 kg)    Physical Exam Vitals and nursing note reviewed.  Constitutional:      Appearance: Normal appearance. He is  well-developed.  HENT:     Head: Normocephalic and atraumatic.  Eyes:     Extraocular Movements: Extraocular movements intact.     Conjunctiva/sclera: Conjunctivae normal.     Pupils: Pupils are equal, round, and reactive to light.  Cardiovascular:     Rate and Rhythm: Normal rate and regular rhythm.     Pulses: Normal pulses.     Heart sounds: Normal heart sounds.  Pulmonary:     Effort: Pulmonary effort is normal.     Breath sounds: Normal breath sounds.  Abdominal:     Palpations: Abdomen is soft.  Musculoskeletal:        General: Normal range of motion.     Cervical back: Normal range of motion and neck supple.  Lymphadenopathy:     Cervical: No cervical adenopathy.  Skin:    General: Skin is warm and dry.  Capillary Refill: Capillary refill takes less than 2 seconds.  Neurological:     General: No focal deficit present.     Mental Status: He is alert and oriented to person, place, and time.     Motor: Weakness present.     Coordination: Coordination abnormal.     Gait: Gait abnormal.     Deep Tendon Reflexes: Reflexes abnormal.     Comments: Patient able to appropriately verbally respond to all questions asked. Unable to follow instructions, though voices understanding of all instructions. Mild to moderate tremor present. Abnormal gait noted   Psychiatric:        Attention and Perception: Attention normal.        Mood and Affect: Mood is depressed. Affect is labile.        Speech: Speech is delayed.        Behavior: Behavior is slowed. Behavior is cooperative.        Thought Content: Thought content normal.        Cognition and Memory: Memory normal. Cognition is impaired.        Judgment: Judgment normal.       Assessment & Plan    1. Hospital discharge follow-up Patient with recent emergency department visit due to altered mental status, possible seizure, and severe insomnia.  2. Altered mental status, unspecified altered mental status type Difficult to  determine if altered mental status due to neurological or psychological cause.  Does have history of seizure disorder with noncompliance.  Referral to neurology has been made.  3. Seizure disorder [facility]) Patient currently taking Trileptal as prescribed.  Continues to have seizures or seizure-like activity.  Refer to neurology for further evaluation and treatment.  4. Psychophysiological insomnia Patient currently prescribed Belsomra 15 mg at bedtime as needed.  Also prescribed Seroquel to take at bedtime.  Today, no new prescriptions for sleep.  He should follow-up with neurology and psychology/psychiatry as scheduled.  5. GAD (generalized anxiety disorder) Continue regular visits with psychiatry as scheduled.  6. Chronic obstructive pulmonary disease, unspecified COPD type ([place]) Stable.  Continue inhalers and respiratory medications as prescribed.  Problem List Items Addressed This Visit       Respiratory   COPD (chronic obstructive pulmonary disease) ([place])     Nervous and Auditory   Seizure disorder ([place])     Other   Psychophysiological insomnia   GAD (generalized anxiety disorder)   Altered mental status   Other Visit Diagnoses     Hospital discharge follow-up    -  Primary        Return in about 3 weeks (around 01/14/2022) for 3-4 weeks. .- see below.         Ronnell Freshwater, NP  [facility] at [facility] [phone] (phone) [phone] (fax)  [place]

## 2021-12-25 ENCOUNTER — Other Ambulatory Visit: Payer: Self-pay

## 2021-12-25 ENCOUNTER — Inpatient Hospital Stay (HOSPITAL_COMMUNITY): Payer: 59

## 2021-12-25 ENCOUNTER — Other Ambulatory Visit: Payer: Self-pay | Admitting: Physician Assistant

## 2021-12-25 ENCOUNTER — Inpatient Hospital Stay (HOSPITAL_COMMUNITY)
Admission: EM | Admit: 2021-12-25 | Discharge: 2021-12-27 | DRG: 092 | Disposition: A | Discharge status: Home / Self Care | Payer: 59 | Attending: Internal Medicine | Admitting: Internal Medicine

## 2021-12-25 ENCOUNTER — Telehealth: Payer: Self-pay | Admitting: Adult Health

## 2021-12-25 ENCOUNTER — Emergency Department (HOSPITAL_COMMUNITY): Payer: 59

## 2021-12-25 ENCOUNTER — Encounter (HOSPITAL_COMMUNITY): Payer: Self-pay

## 2021-12-25 DIAGNOSIS — Z79899 Other long term (current) drug therapy: Secondary | ICD-10-CM

## 2021-12-25 DIAGNOSIS — I252 Old myocardial infarction: Secondary | ICD-10-CM

## 2021-12-25 DIAGNOSIS — G928 Other toxic encephalopathy: Principal | ICD-10-CM | POA: Diagnosis present

## 2021-12-25 DIAGNOSIS — R4701 Aphasia: Secondary | ICD-10-CM | POA: Diagnosis present

## 2021-12-25 DIAGNOSIS — Z818 Family history of other mental and behavioral disorders: Secondary | ICD-10-CM

## 2021-12-25 DIAGNOSIS — E7220 Disorder of urea cycle metabolism, unspecified: Secondary | ICD-10-CM | POA: Diagnosis present

## 2021-12-25 DIAGNOSIS — I639 Cerebral infarction, unspecified: Secondary | ICD-10-CM

## 2021-12-25 DIAGNOSIS — Z6831 Body mass index (BMI) 31.0-31.9, adult: Secondary | ICD-10-CM | POA: Diagnosis not present

## 2021-12-25 DIAGNOSIS — K7460 Unspecified cirrhosis of liver: Secondary | ICD-10-CM | POA: Diagnosis present

## 2021-12-25 DIAGNOSIS — I10 Essential (primary) hypertension: Secondary | ICD-10-CM | POA: Diagnosis present

## 2021-12-25 DIAGNOSIS — F1210 Cannabis abuse, uncomplicated: Secondary | ICD-10-CM | POA: Diagnosis present

## 2021-12-25 DIAGNOSIS — J449 Chronic obstructive pulmonary disease, unspecified: Secondary | ICD-10-CM | POA: Diagnosis present

## 2021-12-25 DIAGNOSIS — G40909 Epilepsy, unspecified, not intractable, without status epilepticus: Secondary | ICD-10-CM

## 2021-12-25 DIAGNOSIS — F17290 Nicotine dependence, other tobacco product, uncomplicated: Secondary | ICD-10-CM | POA: Diagnosis present

## 2021-12-25 DIAGNOSIS — Z85828 Personal history of other malignant neoplasm of skin: Secondary | ICD-10-CM | POA: Diagnosis not present

## 2021-12-25 DIAGNOSIS — F5104 Psychophysiologic insomnia: Secondary | ICD-10-CM | POA: Diagnosis present

## 2021-12-25 DIAGNOSIS — J439 Emphysema, unspecified: Secondary | ICD-10-CM | POA: Diagnosis present

## 2021-12-25 DIAGNOSIS — Y92009 Unspecified place in unspecified non-institutional (private) residence as the place of occurrence of the external cause: Secondary | ICD-10-CM | POA: Diagnosis not present

## 2021-12-25 DIAGNOSIS — R569 Unspecified convulsions: Principal | ICD-10-CM

## 2021-12-25 DIAGNOSIS — D696 Thrombocytopenia, unspecified: Secondary | ICD-10-CM | POA: Diagnosis present

## 2021-12-25 DIAGNOSIS — F39 Unspecified mood [affective] disorder: Secondary | ICD-10-CM | POA: Diagnosis present

## 2021-12-25 DIAGNOSIS — Z8249 Family history of ischemic heart disease and other diseases of the circulatory system: Secondary | ICD-10-CM

## 2021-12-25 DIAGNOSIS — F1911 Other psychoactive substance abuse, in remission: Secondary | ICD-10-CM | POA: Diagnosis present

## 2021-12-25 DIAGNOSIS — E669 Obesity, unspecified: Secondary | ICD-10-CM | POA: Diagnosis present

## 2021-12-25 DIAGNOSIS — N400 Enlarged prostate without lower urinary tract symptoms: Secondary | ICD-10-CM | POA: Diagnosis present

## 2021-12-25 DIAGNOSIS — I110 Hypertensive heart disease with heart failure: Secondary | ICD-10-CM | POA: Diagnosis present

## 2021-12-25 DIAGNOSIS — E785 Hyperlipidemia, unspecified: Secondary | ICD-10-CM | POA: Diagnosis present

## 2021-12-25 DIAGNOSIS — F3170 Bipolar disorder, currently in remission, most recent episode unspecified: Secondary | ICD-10-CM | POA: Diagnosis present

## 2021-12-25 DIAGNOSIS — Z8619 Personal history of other infectious and parasitic diseases: Secondary | ICD-10-CM

## 2021-12-25 DIAGNOSIS — F419 Anxiety disorder, unspecified: Secondary | ICD-10-CM | POA: Diagnosis present

## 2021-12-25 DIAGNOSIS — R2990 Unspecified symptoms and signs involving the nervous system: Secondary | ICD-10-CM | POA: Diagnosis present

## 2021-12-25 DIAGNOSIS — Z7151 Drug abuse counseling and surveillance of drug abuser: Secondary | ICD-10-CM | POA: Diagnosis not present

## 2021-12-25 DIAGNOSIS — T40715A Adverse effect of cannabis, initial encounter: Secondary | ICD-10-CM | POA: Diagnosis present

## 2021-12-25 DIAGNOSIS — T426X5A Adverse effect of other antiepileptic and sedative-hypnotic drugs, initial encounter: Secondary | ICD-10-CM | POA: Diagnosis present

## 2021-12-25 DIAGNOSIS — Z7951 Long term (current) use of inhaled steroids: Secondary | ICD-10-CM

## 2021-12-25 DIAGNOSIS — Z823 Family history of stroke: Secondary | ICD-10-CM

## 2021-12-25 DIAGNOSIS — I5032 Chronic diastolic (congestive) heart failure: Secondary | ICD-10-CM | POA: Diagnosis present

## 2021-12-25 HISTORY — DX: Cerebral infarction, unspecified: I63.9

## 2021-12-25 LAB — COMPREHENSIVE METABOLIC PANEL
ALT: 21 U/L (ref 0–44)
AST: 30 U/L (ref 15–41)
Albumin: 3.8 g/dL (ref 3.5–5.0)
Alkaline Phosphatase: 47 U/L (ref 38–126)
Anion gap: 9 (ref 5–15)
BUN: 14 mg/dL (ref 8–23)
CO2: 27 mmol/L (ref 22–32)
Calcium: 9.2 mg/dL (ref 8.9–10.3)
Chloride: 104 mmol/L (ref 98–111)
Creatinine, Ser: 0.82 mg/dL (ref 0.61–1.24)
GFR, Estimated: >60 mL/min (ref >60)
Glucose, Bld: 112 mg/dL — ABNORMAL HIGH (ref 70–99)
Potassium: 3.8 mmol/L (ref 3.5–5.1)
Sodium: 140 mmol/L (ref 135–145)
Total Bilirubin: 0.5 mg/dL (ref 0.3–1.2)
Total Protein: 6.2 g/dL — ABNORMAL LOW (ref 6.5–8.1)

## 2021-12-25 LAB — CK: Total CK: 145 U/L (ref 49–397)

## 2021-12-25 LAB — RAPID URINE DRUG SCREEN, HOSP PERFORMED
Amphetamines: NOT DETECTED
Barbiturates: NOT DETECTED
Benzodiazepines: NOT DETECTED
Cocaine: NOT DETECTED
Opiates: NOT DETECTED
Tetrahydrocannabinol: POSITIVE — AB

## 2021-12-25 LAB — PHOSPHORUS: Phosphorus: 3.3 mg/dL (ref 2.5–4.6)

## 2021-12-25 LAB — TSH: TSH: 3.515 u[IU]/mL (ref 0.350–4.500)

## 2021-12-25 LAB — CBC
HCT: 41.9 % (ref 39.0–52.0)
Hemoglobin: 14.5 g/dL (ref 13.0–17.0)
MCH: 32.8 pg (ref 26.0–34.0)
MCHC: 34.6 g/dL (ref 30.0–36.0)
MCV: 94.8 fL (ref 80.0–100.0)
Platelets: 81 10*3/uL — ABNORMAL LOW (ref 150–400)
RBC: 4.42 MIL/uL (ref 4.22–5.81)
RDW: 13.2 % (ref 11.5–15.5)
WBC: 4.2 10*3/uL (ref 4.0–10.5)
nRBC: 0 % (ref 0.0–0.2)

## 2021-12-25 LAB — I-STAT CHEM 8, ED
BUN: 16 mg/dL (ref 8–23)
Calcium, Ion: 1.2 mmol/L (ref 1.15–1.40)
Chloride: 103 mmol/L (ref 98–111)
Creatinine, Ser: 0.7 mg/dL (ref 0.61–1.24)
Glucose, Bld: 105 mg/dL — ABNORMAL HIGH (ref 70–99)
HCT: 40 % (ref 39.0–52.0)
Hemoglobin: 13.6 g/dL (ref 13.0–17.0)
Potassium: 3.7 mmol/L (ref 3.5–5.1)
Sodium: 141 mmol/L (ref 135–145)
TCO2: 27 mmol/L (ref 22–32)

## 2021-12-25 LAB — DIFFERENTIAL
Abs Immature Granulocytes: 0.02 10*3/uL (ref 0.00–0.07)
Basophils Absolute: 0 10*3/uL (ref 0.0–0.1)
Basophils Relative: 1 %
Eosinophils Absolute: 0.1 10*3/uL (ref 0.0–0.5)
Eosinophils Relative: 2 %
Immature Granulocytes: 1 %
Lymphocytes Relative: 28 %
Lymphs Abs: 1.2 10*3/uL (ref 0.7–4.0)
Monocytes Absolute: 0.5 10*3/uL (ref 0.1–1.0)
Monocytes Relative: 11 %
Neutro Abs: 2.5 10*3/uL (ref 1.7–7.7)
Neutrophils Relative %: 57 %

## 2021-12-25 LAB — APTT: aPTT: 35 seconds (ref 24–36)

## 2021-12-25 LAB — CBG MONITORING, ED: Glucose-Capillary: 163 mg/dL — ABNORMAL HIGH (ref 70–99)

## 2021-12-25 LAB — VALPROIC ACID LEVEL: Valproic Acid Lvl: 120 ug/mL — ABNORMAL HIGH (ref 50.0–100.0)

## 2021-12-25 LAB — HIV ANTIBODY (ROUTINE TESTING W REFLEX): HIV Screen 4th Generation wRfx: NONREACTIVE

## 2021-12-25 LAB — PROTIME-INR
INR: 1.2 (ref 0.8–1.2)
Prothrombin Time: 14.8 seconds (ref 11.4–15.2)

## 2021-12-25 LAB — MAGNESIUM: Magnesium: 2.2 mg/dL (ref 1.7–2.4)

## 2021-12-25 LAB — ETHANOL: Alcohol, Ethyl (B): <10 mg/dL (ref <10)

## 2021-12-25 MED ORDER — LISINOPRIL 10 MG PO TABS: 5.0000 mg | ORAL_TABLET | Freq: Every day | ORAL | Status: DC

## 2021-12-25 MED ORDER — FOLIC ACID 1 MG PO TABS
1.0000 mg | ORAL_TABLET | Freq: Every day | ORAL | Status: DC
  Administered 2021-12-25 – 2021-12-27 (×3): 1 mg via ORAL
  Filled 2021-12-25 (×3): qty 1

## 2021-12-25 MED ORDER — ONDANSETRON HCL 4 MG/2ML IJ SOLN: 4.0000 mg | Freq: Four times a day (QID) | INTRAMUSCULAR | Status: DC | PRN

## 2021-12-25 MED ORDER — ASPIRIN 81 MG PO CHEW
81.0000 mg | CHEWABLE_TABLET | Freq: Every day | ORAL | Status: DC
  Administered 2021-12-25 – 2021-12-27 (×3): 81 mg via ORAL
  Filled 2021-12-25 (×3): qty 1

## 2021-12-25 MED ORDER — ONDANSETRON HCL 4 MG PO TABS: 4.0000 mg | ORAL_TABLET | Freq: Four times a day (QID) | ORAL | Status: DC | PRN

## 2021-12-25 MED ORDER — THIAMINE HCL 100 MG PO TABS
100.0000 mg | ORAL_TABLET | Freq: Every day | ORAL | Status: DC
  Administered 2021-12-25 – 2021-12-27 (×3): 100 mg via ORAL
  Filled 2021-12-25 (×3): qty 1

## 2021-12-25 MED ORDER — TAMSULOSIN HCL 0.4 MG PO CAPS
0.8000 mg | ORAL_CAPSULE | Freq: Every day | ORAL | Status: DC
  Administered 2021-12-26 – 2021-12-27 (×2): 0.8 mg via ORAL
  Filled 2021-12-25 (×2): qty 2

## 2021-12-25 MED ORDER — BUSPIRONE HCL 10 MG PO TABS
15.0000 mg | ORAL_TABLET | Freq: Three times a day (TID) | ORAL | Status: DC
  Administered 2021-12-26 – 2021-12-27 (×4): 15 mg via ORAL
  Filled 2021-12-25 (×4): qty 2

## 2021-12-25 MED ORDER — POTASSIUM CHLORIDE CRYS ER 10 MEQ PO TBCR
10.0000 meq | EXTENDED_RELEASE_TABLET | Freq: Once | ORAL | Status: AC
Start: 2021-12-25 — End: 2021-12-25
  Administered 2021-12-25: 10 meq via ORAL
  Filled 2021-12-25: qty 1

## 2021-12-25 MED ORDER — ALBUTEROL SULFATE (2.5 MG/3ML) 0.083% IN NEBU: 2.5000 mg | INHALATION_SOLUTION | RESPIRATORY_TRACT | Status: DC | PRN

## 2021-12-25 MED ORDER — ACETAMINOPHEN 650 MG RE SUPP: 650.0000 mg | Freq: Four times a day (QID) | RECTAL | Status: DC | PRN

## 2021-12-25 MED ORDER — ATORVASTATIN CALCIUM 10 MG PO TABS
20.0000 mg | ORAL_TABLET | Freq: Every day | ORAL | Status: DC
  Administered 2021-12-25 – 2021-12-26 (×2): 20 mg via ORAL
  Filled 2021-12-25 (×2): qty 2

## 2021-12-25 MED ORDER — SODIUM CHLORIDE 0.9% FLUSH: 3.0000 mL | Freq: Once | INTRAVENOUS | Status: DC

## 2021-12-25 MED ORDER — SENNOSIDES-DOCUSATE SODIUM 8.6-50 MG PO TABS: 1.0000 | ORAL_TABLET | Freq: Every evening | ORAL | Status: DC | PRN

## 2021-12-25 MED ORDER — MOMETASONE FURO-FORMOTEROL FUM 100-5 MCG/ACT IN AERO
2.0000 | INHALATION_SPRAY | Freq: Two times a day (BID) | RESPIRATORY_TRACT | Status: DC
  Administered 2021-12-25 – 2021-12-27 (×3): 2 via RESPIRATORY_TRACT
  Filled 2021-12-25 (×2): qty 8.8

## 2021-12-25 MED ORDER — ACETAMINOPHEN 325 MG PO TABS: 650.0000 mg | ORAL_TABLET | Freq: Four times a day (QID) | ORAL | Status: DC | PRN

## 2021-12-25 MED ORDER — DIVALPROEX SODIUM 250 MG PO DR TAB
500.0000 mg | DELAYED_RELEASE_TABLET | Freq: Three times a day (TID) | ORAL | Status: DC
  Administered 2021-12-25 – 2021-12-26 (×2): 500 mg via ORAL
  Filled 2021-12-25 (×2): qty 2

## 2021-12-25 MED ORDER — ENOXAPARIN SODIUM 40 MG/0.4ML IJ SOSY: 40.0000 mg | PREFILLED_SYRINGE | INTRAMUSCULAR | Status: DC

## 2021-12-25 MED ORDER — QUETIAPINE FUMARATE ER 300 MG PO TB24
300.0000 mg | ORAL_TABLET | Freq: Every day | ORAL | Status: DC
  Administered 2021-12-25 – 2021-12-26 (×2): 300 mg via ORAL
  Filled 2021-12-25 (×3): qty 1

## 2021-12-25 MED ORDER — METOPROLOL SUCCINATE ER 25 MG PO TB24
12.5000 mg | ORAL_TABLET | Freq: Every day | ORAL | Status: DC
  Administered 2021-12-26 – 2021-12-27 (×2): 12.5 mg via ORAL
  Filled 2021-12-25 (×2): qty 1

## 2021-12-25 MED ORDER — TRAZODONE HCL 50 MG PO TABS
50.0000 mg | ORAL_TABLET | Freq: Every evening | ORAL | Status: DC | PRN
Start: 2021-12-25 — End: 2021-12-27
  Administered 2021-12-25: 50 mg via ORAL
  Filled 2021-12-25: qty 1

## 2021-12-25 MED ORDER — SERTRALINE HCL 100 MG PO TABS
200.0000 mg | ORAL_TABLET | Freq: Every day | ORAL | Status: DC
  Administered 2021-12-26 – 2021-12-27 (×2): 200 mg via ORAL
  Filled 2021-12-25 (×2): qty 2

## 2021-12-25 NOTE — Code Documentation (Signed)
Stroke Response Nurse Documentation Code Documentation  [name] is a 66 y.o. male arriving to [facility]  via [facility] on 12/25/2021 with past medical hx of Seizures, HLP, HTN, CHF. Code stroke was activated by ED.   Patient from ED where he was LKW at 1445 and now complaining of aphasia. Pt was at [place] with friend when he had a sudden onset of not being able to speak. EMS was called and the patient resolved upon EMS arrival. Pt transported to the ED. After arrival, the RN reported they were having a full conversation.   Stroke team at the bedside on patient activation. Labs drawn and patient cleared for CT by Dr. Sherry Ruffing. Patient to CT with team. NIHSS 0, see documentation for details and code stroke times. The following imaging was completed:  CT Head. Patient is not a candidate for IV Thrombolytic due to no symptoms at this time. Patient is not a candidate for IR due to no LVO symptoms per MD Rory Percy.   Care Plan: q2 NIHSS/VS and TIA Alert.   Bedside handoff with ED RN Donia Guiles.    Kathrin Greathouse  Stroke Response RN

## 2021-12-25 NOTE — ED Notes (Signed)
Patient transported to MRI 

## 2021-12-25 NOTE — Progress Notes (Signed)
LTM EEG hooked up and running - no initial skin breakdown - push button tested - neuro notified. [facility] monitoring.  

## 2021-12-25 NOTE — ED Provider Notes (Signed)
[facility] EMERGENCY DEPARTMENT Provider Note   CSN: [id number] Arrival date & time: 12/25/21  1434  An emergency department physician performed an initial assessment on this suspected stroke patient at 1450.  History  Chief Complaint  Patient presents with   Altered Mental Status    [name] is a 66 y.o. male.   Altered Mental Status   66 year old male with medical history significant for seizure disorder on Depakote, CAD, hepatitis B, hepatitis C, anxiety, depression, HLD, HTN, substance abuse, CHF, bipolar disorder, prior CVA who presents to the emergency department with episodes of aphasia.  The patient was reportedly eating with friends at a restaurant when they noticed he was answering questions slower and ordering his food slower than normal.  On arrival, the patient was evaluated by the prior ER team and was found to be completely aphasic and code stroke was activated.  He subsequently returned to his baseline in the CT scanner.  Neurology evaluated the patient and was bedside in the scanner.  The patient's airway was cleared.  Home Medications Prior to Admission medications   Medication Sig Start Date End Date Taking? Authorizing Provider  albuterol (PROVENTIL) (2.5 MG/3ML) 0.083% nebulizer solution Take 3 mLs (2.5 mg total) by nebulization every 6 (six) hours as needed for wheezing or shortness of breath. 07/18/20   Lorrene Reid, PA-C  albuterol (VENTOLIN HFA) 108 (90 Base) MCG/ACT inhaler INHALE 2 PUFFS INTO THE LUNGS EVERY 6 HOURS AS NEEDED FOR WHEEZING OR SHORTNESS OF BREATH. 09/04/21   Lorrene Reid, PA-C  atorvastatin (LIPITOR) 20 MG tablet TAKE 1 TABLET (20 MG TOTAL) BY MOUTH AT BEDTIME. 12/25/21   Boscia, Heather E, NP  B Complex-C (SUPER B COMPLEX/VITAMIN C PO) Take 1 tablet by mouth daily.    [provider]  budesonide-formoterol (SYMBICORT) 80-4.5 MCG/ACT inhaler Inhale 2 puffs into the lungs 2 (two) times daily. 10/06/19    Opalski, Neoma Laming, DO  busPIRone (BUSPAR) 15 MG tablet Take 15 mg by mouth 3 (three) times daily.    [provider]  Cholecalciferol (VITAMIN D3) 125 MCG (5000 UT) CAPS Take 1 capsule by mouth 3 (three) times daily.  05/04/18   [provider]  divalproex (DEPAKOTE) 500 MG DR tablet TAKE 1 TABLET (500 MG TOTAL) BY MOUTH 3 TIMES DAILY. 12/11/21   Ward Givens, NP  hydrOXYzine (ATARAX/VISTARIL) 10 MG tablet TAKE 1 TABLET BY MOUTH 3 TIMES DAILY AS NEEDED. 10/14/19   Opalski, Neoma Laming, DO  lisinopril (ZESTRIL) 5 MG tablet TAKE 1 TABLET BY MOUTH DAILY. 12/20/21   Lorrene Reid, PA-C  metoprolol succinate (TOPROL-XL) 25 MG 24 hr tablet TAKE 1/2 TABLET BY MOUTH DAILY. 07/04/21   Abonza, Maritza, PA-C  NITROSTAT 0.4 MG SL tablet PLACE 1 TABLET UNDER THE TONGUE EVERY 5 MINUTES AS NEEDED FOR CHEST PAIN FOR UP TO 3 DOSES. 03/26/20   Lorrene Reid, PA-C  OXcarbazepine (TRILEPTAL) 150 MG tablet Take 150 mg by mouth 2 (two) times daily.    [provider]  QUEtiapine (SEROQUEL XR) 300 MG 24 hr tablet Take 1 tablet by mouth at bedtime. 07/27/18   [provider]  sertraline (ZOLOFT) 100 MG tablet Take 200 mg by mouth daily.    [provider]  Suvorexant (BELSOMRA) 15 MG TABS Take 1 tablet by mouth at bedtime as needed. 12/19/21   Ronnell Freshwater, NP  tamsulosin (FLOMAX) 0.4 MG CAPS capsule TAKE 2 CAPSULES BY MOUTH DAILY. 11/20/21   Lorrene Reid, PA-C  Allergies    Patient has no known allergies.    Review of Systems   Review of Systems  Unable to perform ROS: Acuity of condition    Physical Exam Updated Vital Signs BP 137/72   Pulse 69   Temp 98.8 F (37.1 C) (Oral)   Resp (!) 26   Ht '5\' 8"'$  (1.727 m)   Wt 94.8 kg   SpO2 97%   BMI 31.78 kg/m  Physical Exam Vitals and nursing note reviewed.  Constitutional:      General: He is not in acute distress. HENT:     Head: Normocephalic and atraumatic.  Eyes:     Conjunctiva/sclera: Conjunctivae  normal.     Pupils: Pupils are equal, round, and reactive to light.  Cardiovascular:     Rate and Rhythm: Normal rate and regular rhythm.  Pulmonary:     Effort: Pulmonary effort is normal. No respiratory distress.  Abdominal:     General: There is no distension.     Tenderness: There is no guarding.  Musculoskeletal:        General: No deformity or signs of injury.     Cervical back: Neck supple.  Skin:    Findings: No lesion or rash.  Neurological:     General: No focal deficit present.     Mental Status: He is alert and oriented to person, place, and time. Mental status is at baseline.     Cranial Nerves: No cranial nerve deficit.     Sensory: No sensory deficit.     Motor: No weakness.     Coordination: Coordination normal.     ED Results / Procedures / Treatments   Labs (all labs ordered are listed, but only abnormal results are displayed) Labs Reviewed  CBC - Abnormal; Notable for the following components:      Result Value   Platelets 81 (*)    All other components within normal limits  COMPREHENSIVE METABOLIC PANEL - Abnormal; Notable for the following components:   Glucose, Bld 112 (*)    Total Protein 6.2 (*)    All other components within normal limits  VALPROIC ACID LEVEL - Abnormal; Notable for the following components:   Valproic Acid Lvl 120 (*)    All other components within normal limits  CBG MONITORING, ED - Abnormal; Notable for the following components:   Glucose-Capillary 163 (*)    All other components within normal limits  I-STAT CHEM 8, ED - Abnormal; Notable for the following components:   Glucose, Bld 105 (*)    All other components within normal limits  PROTIME-INR  APTT  DIFFERENTIAL  ETHANOL  RAPID URINE DRUG SCREEN, HOSP PERFORMED  AMMONIA  CBC  HIV ANTIBODY (ROUTINE TESTING W REFLEX)  MAGNESIUM  PHOSPHORUS  TSH  BASIC METABOLIC PANEL  CBC  CBG MONITORING, ED    EKG None  Radiology MR BRAIN WO CONTRAST  Result Date:  12/25/2021 CLINICAL DATA:  Mental status change, unknown cause EXAM: MRI HEAD WITHOUT CONTRAST TECHNIQUE: Multiplanar, multiecho pulse sequences of the brain and surrounding structures were obtained without intravenous contrast. COMPARISON:  CT head from the same day.  MRI September 01, 2013. FINDINGS: Brain: No acute infarction, hemorrhage, hydrocephalus, extra-axial collection or mass lesion. Cerebral atrophy with prominence of the extra-axial spaces. Vascular: Major arterial flow voids are maintained skull base. Skull and upper cervical spine: Normal marrow signal. Sinuses/Orbits: Mild paranasal sinus mucosal thickening. Remote right medial orbital wall fracture. No acute orbital findings. Other: No mastoid  effusions. IMPRESSION: 1. No evidence of acute intracranial abnormality. 2.  Cerebral atrophy (ICD10-G31.9). Electronically Signed   By: Margaretha Sheffield M.D.   On: 12/25/2021 16:30   CT HEAD CODE STROKE WO CONTRAST  Result Date: 12/25/2021 CLINICAL DATA:  Code stroke.  Neuro deficit, acute, stroke suspected EXAM: CT HEAD WITHOUT CONTRAST TECHNIQUE: Contiguous axial images were obtained from the base of the skull through the vertex without intravenous contrast. RADIATION DOSE REDUCTION: This exam was performed according to the departmental dose-optimization program which includes automated exposure control, adjustment of the mA and/or kV according to patient size and/or use of iterative reconstruction technique. COMPARISON:  12/11/2021 FINDINGS: Brain: No acute intracranial hemorrhage, mass effect, or edema. No new loss of gray-white differentiation. Prominence of the ventricles and sulci reflects stable parenchymal volume loss. No extra-axial collection. Vascular: No hyperdense vessel. There is intracranial atherosclerotic calcification at the skull base Skull: No acute abnormality. Sinuses/Orbits: No acute abnormality. Other: Mastoid air cells are clear. ASPECTS ([place] Stroke Program Early CT Score) -  Ganglionic level infarction (caudate, lentiform nuclei, internal capsule, insula, M1-M3 cortex): 7 - Supraganglionic infarction (M4-M6 cortex): 3 Total score (0-10 with 10 being normal): 10 IMPRESSION: There is no acute intracranial hemorrhage or evidence of acute infarction. ASPECT score is 10. These results were communicated to Dr. Rory Percy at 3:14 pm on 12/25/2021 by text page via the [facility] messaging system. Electronically Signed   By: Macy Mis M.D.   On: 12/25/2021 15:15    Procedures Procedures    Medications Ordered in ED Medications  sodium chloride flush (NS) 0.9 % injection 3 mL (3 mLs Intravenous Not Given 12/25/21 1547)  enoxaparin (LOVENOX) injection 40 mg (has no administration in time range)  acetaminophen (TYLENOL) tablet 650 mg (has no administration in time range)    Or  acetaminophen (TYLENOL) suppository 650 mg (has no administration in time range)  senna-docusate (Senokot-S) tablet 1 tablet (has no administration in time range)  ondansetron (ZOFRAN) tablet 4 mg (has no administration in time range)    Or  ondansetron (ZOFRAN) injection 4 mg (has no administration in time range)  albuterol (PROVENTIL) (2.5 MG/3ML) 0.083% nebulizer solution 2.5 mg (has no administration in time range)  divalproex (DEPAKOTE) DR tablet 500 mg (has no administration in time range)  QUEtiapine (SEROQUEL XR) 24 hr tablet 300 mg (has no administration in time range)  sertraline (ZOLOFT) tablet 200 mg (has no administration in time range)  tamsulosin (FLOMAX) capsule 0.8 mg (has no administration in time range)  metoprolol succinate (TOPROL-XL) 24 hr tablet 12.5 mg (has no administration in time range)  lisinopril (ZESTRIL) tablet 5 mg (has no administration in time range)  busPIRone (BUSPAR) tablet 15 mg (has no administration in time range)  mometasone-formoterol (DULERA) 100-5 MCG/ACT inhaler 2 puff (has no administration in time range)  atorvastatin (LIPITOR) tablet 20 mg (has no  administration in time range)    ED Course/ Medical Decision Making/ A&P                           Medical Decision Making Amount and/or Complexity of Data Reviewed Labs: ordered. Radiology: ordered.  Risk Decision regarding hospitalization.   66 year old male with medical history significant for seizure disorder on Depakote, CAD, hepatitis B, hepatitis C, anxiety, depression, HLD, HTN, substance abuse, CHF, bipolar disorder, prior CVA who presents to the emergency department with episodes of aphasia.  The patient was reportedly eating with friends at a restaurant  when they noticed he was answering questions slower and ordering his food slower than normal.  On arrival, the patient was evaluated by the prior ER team and was found to be completely aphasic and code stroke was activated.  He subsequently returned to his baseline in the CT scanner.  Neurology evaluated the patient and was bedside in the scanner.  The patient's airway was cleared.  Code stroke imaging was performed and negative for acute intracranial abnormality.  Vital signs were stable on arrival.  The patient on my evaluation after code stroke imaging was found to be at his neurologic baseline with no complaints or deficits.  Neurology evaluated the patient and was concerned for possible breakthrough seizures despite the patient's Depakote use.  Recommended MRI imaging.  Blood glucose on arrival was 163.  MRI imaging was performed and was negative for acute stroke.  Neurology recommended admission for observation in the setting of possible breakthrough seizures, LTM monitoring.  Hospitalist medicine, Dr. Lavera Guise was contacted for admission and accepted the patient in admission  Final Clinical Impression(s) / ED Diagnoses Final diagnoses:  Seizure-like activity [facility])    Rx / [place] Orders ED Discharge Orders     None         Regan Lemming, MD 12/25/21 1742
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
## 2021-12-25 NOTE — ED Notes (Signed)
Back from mri

## 2021-12-25 NOTE — Progress Notes (Signed)
Physician in the room. Waiting for her to wrap up.

## 2021-12-25 NOTE — ED Triage Notes (Signed)
Pt was eating with friends at a restaurant when they noticed he was answering questions slower and ordering his food slower then normal pt is alert and oriented x 4 states that since his 2 seizures and a CVA 4 weeks ago he has felt slower and weak .

## 2021-12-25 NOTE — ED Notes (Signed)
MRI called to notify me that the pt pulled a tick off of him and wants [place] to check him before Mri

## 2021-12-25 NOTE — ED Provider Notes (Signed)
2:56 PM Patient was brought into the exam room via EMS and nursing.  According to EMS, patient had recent stroke and was ordering at a restaurant and was ordering very slowly and not quite talking right.  EMS reports he was speaking clearly normally during transport and when he arrived to the room.  Per nurse tech, they were talking about [place] and other topics with full conversation when suddenly he stopped talking.  They brought me to the room and he was completely aphasic not answering any questions aside from occasional yes or no answers.  He did not have any numbness, tingling, or weakness of extremities and pupils are symmetric and reactive.  He would shake his head no to any pain but he is still not talking.  Thus we will make him a code stroke with a last normal of 2:45 PM, 10 minutes ago.  Oncoming team will fully see patient and continue his work-up   Kindel Rochefort, Gwenyth Allegra, MD 12/25/21 1457

## 2021-12-25 NOTE — Progress Notes (Signed)
EEG complete - results pending 

## 2021-12-26 ENCOUNTER — Encounter (HOSPITAL_COMMUNITY): Payer: Self-pay | Admitting: Internal Medicine

## 2021-12-26 ENCOUNTER — Inpatient Hospital Stay (HOSPITAL_COMMUNITY): Payer: 59

## 2021-12-26 ENCOUNTER — Ambulatory Visit: Payer: 59 | Admitting: Nurse Practitioner

## 2021-12-26 DIAGNOSIS — F39 Unspecified mood [affective] disorder: Secondary | ICD-10-CM | POA: Diagnosis present

## 2021-12-26 DIAGNOSIS — N400 Enlarged prostate without lower urinary tract symptoms: Secondary | ICD-10-CM | POA: Diagnosis present

## 2021-12-26 DIAGNOSIS — I5032 Chronic diastolic (congestive) heart failure: Secondary | ICD-10-CM | POA: Diagnosis not present

## 2021-12-26 DIAGNOSIS — R2990 Unspecified symptoms and signs involving the nervous system: Secondary | ICD-10-CM | POA: Diagnosis not present

## 2021-12-26 DIAGNOSIS — J449 Chronic obstructive pulmonary disease, unspecified: Secondary | ICD-10-CM | POA: Diagnosis present

## 2021-12-26 DIAGNOSIS — R569 Unspecified convulsions: Secondary | ICD-10-CM | POA: Diagnosis not present

## 2021-12-26 DIAGNOSIS — D696 Thrombocytopenia, unspecified: Secondary | ICD-10-CM | POA: Diagnosis present

## 2021-12-26 DIAGNOSIS — E669 Obesity, unspecified: Secondary | ICD-10-CM | POA: Diagnosis present

## 2021-12-26 LAB — BASIC METABOLIC PANEL
Anion gap: 6 (ref 5–15)
BUN: 13 mg/dL (ref 8–23)
CO2: 24 mmol/L (ref 22–32)
Calcium: 8.9 mg/dL (ref 8.9–10.3)
Chloride: 111 mmol/L (ref 98–111)
Creatinine, Ser: 0.66 mg/dL (ref 0.61–1.24)
GFR, Estimated: >60 mL/min (ref >60)
Glucose, Bld: 129 mg/dL — ABNORMAL HIGH (ref 70–99)
Potassium: 3.5 mmol/L (ref 3.5–5.1)
Sodium: 141 mmol/L (ref 135–145)

## 2021-12-26 LAB — CBC
HCT: 42.2 % (ref 39.0–52.0)
Hemoglobin: 14.4 g/dL (ref 13.0–17.0)
MCH: 32.7 pg (ref 26.0–34.0)
MCHC: 34.1 g/dL (ref 30.0–36.0)
MCV: 95.7 fL (ref 80.0–100.0)
Platelets: 73 10*3/uL — ABNORMAL LOW (ref 150–400)
RBC: 4.41 MIL/uL (ref 4.22–5.81)
RDW: 13.2 % (ref 11.5–15.5)
WBC: 4.3 10*3/uL (ref 4.0–10.5)
nRBC: 0 % (ref 0.0–0.2)

## 2021-12-26 LAB — ECHOCARDIOGRAM COMPLETE
Area-P 1/2: 2.7 cm2
Height: 68 in
S' Lateral: 4.1 cm
Weight: 3343.94 oz

## 2021-12-26 LAB — AMMONIA: Ammonia: 85 umol/L — ABNORMAL HIGH (ref 9–35)

## 2021-12-26 MED ORDER — LORAZEPAM 0.5 MG PO TABS
0.5000 mg | ORAL_TABLET | Freq: Four times a day (QID) | ORAL | Status: DC | PRN
  Administered 2021-12-26 – 2021-12-27 (×3): 0.5 mg via ORAL
  Filled 2021-12-26 (×3): qty 1

## 2021-12-26 MED ORDER — PERFLUTREN LIPID MICROSPHERE
1.0000 mL | INTRAVENOUS | Status: AC | PRN
  Administered 2021-12-26: 2 mL via INTRAVENOUS

## 2021-12-26 MED ORDER — ORAL CARE MOUTH RINSE
15.0000 mL | OROMUCOSAL | Status: DC
  Administered 2021-12-26 – 2021-12-27 (×2): 15 mL via OROMUCOSAL

## 2021-12-26 MED ORDER — ORAL CARE MOUTH RINSE: 15.0000 mL | OROMUCOSAL | Status: DC | PRN

## 2021-12-26 MED ORDER — LACTULOSE 10 GM/15ML PO SOLN
20.0000 g | Freq: Three times a day (TID) | ORAL | Status: DC
  Administered 2021-12-26 – 2021-12-27 (×4): 20 g via ORAL
  Filled 2021-12-26 (×4): qty 30

## 2021-12-26 MED ORDER — MELATONIN 5 MG PO TABS
5.0000 mg | ORAL_TABLET | Freq: Once | ORAL | Status: AC
  Administered 2021-12-26: 5 mg via ORAL
  Filled 2021-12-26: qty 1

## 2021-12-26 MED ORDER — DIVALPROEX SODIUM 250 MG PO DR TAB
500.0000 mg | DELAYED_RELEASE_TABLET | Freq: Two times a day (BID) | ORAL | Status: DC
  Administered 2021-12-26 – 2021-12-27 (×2): 500 mg via ORAL
  Filled 2021-12-26 (×2): qty 2

## 2021-12-26 MED ORDER — OXCARBAZEPINE 150 MG PO TABS
150.0000 mg | ORAL_TABLET | Freq: Two times a day (BID) | ORAL | Status: DC
  Administered 2021-12-26 – 2021-12-27 (×3): 150 mg via ORAL
  Filled 2021-12-26 (×5): qty 1
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
## 2021-12-26 NOTE — ED Notes (Signed)
Pt stating that he wants to leave, states he is tired of waiting;this RN encouraged pt to stay, explained that his workup is not complete and we are still waiting on results; pt still stating he is ready to go; Dr. Posey Pronto notified

## 2021-12-26 NOTE — ED Notes (Signed)
Per Dr. Posey Pronto, pt will be able to leave tomorrow; pt notified, asked if pt will be will to stay until then, pt states "I guess"

## 2021-12-26 NOTE — Hospital Course (Signed)
Past medical history of HTN, HLD, COPD, bipolar disorder, seizure disorder cannabis use.  Now present to the hospital with complaints of aphasia.  Stroke and TIA ruled out. At the time of my evaluation patient reports anxiety but no other acute complaints. Neurology consulted.  Depakote level elevated.  Possible toxicity cannot be ruled out.

## 2021-12-26 NOTE — Progress Notes (Signed)
Neurology Progress Note   S:// Seen and examined.  No recurrence of spells   O:// Current vital signs: BP 103/69   Pulse 68   Temp 98.8 F (37.1 C) (Oral)   Resp 20   Ht '5\' 8"'$  (1.727 m)   Wt 94.8 kg   SpO2 94%   BMI 31.78 kg/m  Vital signs in last 24 hours: Temp:  [98.8 F (37.1 C)] 98.8 F (37.1 C) (07/12 1447) Pulse Rate:  [52-77] 68 (07/13 0850) Resp:  [10-26] 20 (07/13 0850) BP: (103-148)/(56-88) 103/69 (07/13 0815) SpO2:  [91 %-98 %] 94 % (07/13 0850) Weight:  [94.8 kg] 94.8 kg (07/12 1442) General: Slightly disheveled, in no acute distress HEENT: Normocephalic atraumatic Lungs: Clear Cardiovascular: Regular rate rhythm Abdomen nondistended nontender Neurological exam He is awake alert oriented x3 His speech is somewhat slow but not truly dysarthric. No evidence of aphasia Mildly reduced attention concentration Cranial nerves II to XII intact Motor examination with no drift in any of the force Sensation intact light touch without extinction Coordination examination with no dysmetria Gait deferred NIH 0 again-unchanged exam from yesterday.  Medications  Current Facility-Administered Medications:    acetaminophen (TYLENOL) tablet 650 mg, 650 mg, Oral, Q6H PRN **OR** acetaminophen (TYLENOL) suppository 650 mg, 650 mg, Rectal, Q6H PRN, George Hugh, MD   albuterol (PROVENTIL) (2.5 MG/3ML) 0.083% nebulizer solution 2.5 mg, 2.5 mg, Nebulization, Q2H PRN, George Hugh, MD   aspirin chewable tablet 81 mg, 81 mg, Oral, Daily, Masoud, Jarrett Soho, MD, 81 mg at 12/25/21 2004   atorvastatin (LIPITOR) tablet 20 mg, 20 mg, Oral, QHS, Masoud, Jarrett Soho, MD, 20 mg at 12/25/21 2141   busPIRone (BUSPAR) tablet 15 mg, 15 mg, Oral, TID, George Hugh, MD   divalproex (DEPAKOTE) DR tablet 500 mg, 500 mg, Oral, Q8H, Masoud, Hannah, MD, 500 mg at 78/93/81 0175   folic acid (FOLVITE) tablet 1 mg, 1 mg, Oral, Daily, Masoud, Jarrett Soho, MD, 1 mg at 12/25/21 2004   lisinopril (ZESTRIL)  tablet 5 mg, 5 mg, Oral, Daily, Masoud, Jarrett Soho, MD   metoprolol succinate (TOPROL-XL) 24 hr tablet 12.5 mg, 12.5 mg, Oral, Daily, Masoud, Hannah, MD   mometasone-formoterol (DULERA) 100-5 MCG/ACT inhaler 2 puff, 2 puff, Inhalation, BID, George Hugh, MD, 2 puff at 12/25/21 2200   ondansetron (ZOFRAN) tablet 4 mg, 4 mg, Oral, Q6H PRN **OR** ondansetron (ZOFRAN) injection 4 mg, 4 mg, Intravenous, Q6H PRN, George Hugh, MD   QUEtiapine (SEROQUEL XR) 24 hr tablet 300 mg, 300 mg, Oral, QHS, Masoud, Hannah, MD, 300 mg at 12/25/21 2141   senna-docusate (Senokot-S) tablet 1 tablet, 1 tablet, Oral, QHS PRN, George Hugh, MD   sertraline (ZOLOFT) tablet 200 mg, 200 mg, Oral, Daily, Masoud, Hannah, MD   sodium chloride flush (NS) 0.9 % injection 3 mL, 3 mL, Intravenous, Once, Regan Lemming, MD   tamsulosin (FLOMAX) capsule 0.8 mg, 0.8 mg, Oral, Daily, Masoud, Hannah, MD   thiamine tablet 100 mg, 100 mg, Oral, Daily, Masoud, Jarrett Soho, MD, 100 mg at 12/25/21 2004   traZODone (DESYREL) tablet 50 mg, 50 mg, Oral, QHS PRN, George Hugh, MD, 50 mg at 12/25/21 2144  Current Outpatient Medications:    albuterol (PROVENTIL) (2.5 MG/3ML) 0.083% nebulizer solution, Take 3 mLs (2.5 mg total) by nebulization every 6 (six) hours as needed for wheezing or shortness of breath., Disp: 150 mL, Rfl: 1   albuterol (VENTOLIN HFA) 108 (90 Base) MCG/ACT inhaler, INHALE 2 PUFFS INTO THE LUNGS EVERY 6 HOURS AS NEEDED FOR WHEEZING OR SHORTNESS  OF BREATH., Disp: 8.5 g, Rfl: 1   atorvastatin (LIPITOR) 20 MG tablet, TAKE 1 TABLET (20 MG TOTAL) BY MOUTH AT BEDTIME., Disp: 90 tablet, Rfl: 0   B Complex-C (SUPER B COMPLEX/VITAMIN C PO), Take 1 tablet by mouth daily., Disp: , Rfl:    budesonide-formoterol (SYMBICORT) 80-4.5 MCG/ACT inhaler, Inhale 2 puffs into the lungs 2 (two) times daily., Disp: 1 Inhaler, Rfl: 2   busPIRone (BUSPAR) 15 MG tablet, Take 15 mg by mouth 3 (three) times daily., Disp: , Rfl:    Cholecalciferol (VITAMIN  D3) 125 MCG (5000 UT) CAPS, Take 1 capsule by mouth 3 (three) times daily. , Disp: , Rfl:    divalproex (DEPAKOTE) 500 MG DR tablet, TAKE 1 TABLET (500 MG TOTAL) BY MOUTH 3 TIMES DAILY., Disp: 270 tablet, Rfl: 3   hydrOXYzine (ATARAX/VISTARIL) 10 MG tablet, TAKE 1 TABLET BY MOUTH 3 TIMES DAILY AS NEEDED., Disp: 270 tablet, Rfl: 0   lisinopril (ZESTRIL) 5 MG tablet, TAKE 1 TABLET BY MOUTH DAILY., Disp: 90 tablet, Rfl: 0   metoprolol succinate (TOPROL-XL) 25 MG 24 hr tablet, TAKE 1/2 TABLET BY MOUTH DAILY., Disp: 45 tablet, Rfl: 1   NITROSTAT 0.4 MG SL tablet, PLACE 1 TABLET UNDER THE TONGUE EVERY 5 MINUTES AS NEEDED FOR CHEST PAIN FOR UP TO 3 DOSES. (Patient taking differently: Place 0.4 mg under the tongue every 5 (five) minutes x 3 doses as needed for chest pain.), Disp: 25 tablet, Rfl: 0   OXcarbazepine (TRILEPTAL) 150 MG tablet, Take 150 mg by mouth 2 (two) times daily., Disp: , Rfl:    QUEtiapine (SEROQUEL XR) 300 MG 24 hr tablet, Take 1 tablet by mouth at bedtime., Disp: , Rfl:    sertraline (ZOLOFT) 100 MG tablet, Take 200 mg by mouth daily., Disp: , Rfl:    Suvorexant (BELSOMRA) 15 MG TABS, Take 1 tablet by mouth at bedtime as needed., Disp: 10 tablet, Rfl: 0   tamsulosin (FLOMAX) 0.4 MG CAPS capsule, TAKE 2 CAPSULES BY MOUTH DAILY., Disp: 180 capsule, Rfl: 0   traZODone (DESYREL) 100 MG tablet, Take 100 mg by mouth at bedtime as needed for sleep., Disp: , Rfl:  Labs CBC    Component Value Date/Time   WBC 4.3 12/26/2021 0300   RBC 4.41 12/26/2021 0300   HGB 14.4 12/26/2021 0300   HGB 15.5 07/26/2020 1101   HGB 14.4 03/09/2017 1550   HCT 42.2 12/26/2021 0300   HCT 46.0 07/26/2020 1101   HCT 41.7 03/09/2017 1550   PLT 73 (L) 12/26/2021 0300   PLT 97 (LL) 07/26/2020 1101   MCV 95.7 12/26/2021 0300   MCV 95 07/26/2020 1101   MCV 93.5 03/09/2017 1550   MCH 32.7 12/26/2021 0300   MCHC 34.1 12/26/2021 0300   RDW 13.2 12/26/2021 0300   RDW 14.0 07/26/2020 1101   RDW 14.6 03/09/2017  1550   LYMPHSABS 1.2 12/25/2021 1504   LYMPHSABS 0.8 04/13/2019 0941   LYMPHSABS 1.3 03/09/2017 1550   MONOABS 0.5 12/25/2021 1504   MONOABS 0.7 03/09/2017 1550   EOSABS 0.1 12/25/2021 1504   EOSABS 0.1 04/13/2019 0941   EOSABS 0.3 02/02/2013 0555   BASOSABS 0.0 12/25/2021 1504   BASOSABS 0.0 04/13/2019 0941   BASOSABS 0.0 03/09/2017 1550    CMP     Component Value Date/Time   NA 141 12/26/2021 0300   NA 141 07/26/2020 1101   NA 143 03/09/2017 1550   K 3.5 12/26/2021 0300   K 4.2 03/09/2017 1550  CL 111 12/26/2021 0300   CL 110 (H) 06/08/2013 1510   CO2 24 12/26/2021 0300   CO2 25 03/09/2017 1550   GLUCOSE 129 (H) 12/26/2021 0300   GLUCOSE 91 03/09/2017 1550   BUN 13 12/26/2021 0300   BUN 14 07/26/2020 1101   BUN 17.2 03/09/2017 1550   CREATININE 0.66 12/26/2021 0300   CREATININE 0.9 03/09/2017 1550   CALCIUM 8.9 12/26/2021 0300   CALCIUM 9.1 03/09/2017 1550   PROT 6.2 (L) 12/25/2021 1504   PROT 6.8 07/26/2020 1101   PROT 6.4 03/09/2017 1550   ALBUMIN 3.8 12/25/2021 1504   ALBUMIN 4.9 (H) 07/26/2020 1101   ALBUMIN 3.8 03/09/2017 1550   AST 30 12/25/2021 1504   AST 28 03/09/2017 1550   ALT 21 12/25/2021 1504   ALT 24 03/09/2017 1550   ALKPHOS 47 12/25/2021 1504   ALKPHOS 52 03/09/2017 1550   BILITOT 0.5 12/25/2021 1504   BILITOT 0.3 07/26/2020 1101   BILITOT 0.56 03/09/2017 1550   GFRNONAA >60 12/26/2021 0300   GFRNONAA >89 10/16/2015 1503   GFRAA 104 07/26/2020 1101   GFRAA >89 10/16/2015 1503  Ammonia 85  Imaging I have reviewed images in epic and the results pertinent to this consultation are: Negative MRI examination of the brain  Neurodiagnostics Spot EEG normal. LTM EEG-no seizures   Assessment: 66 year old past history of anxiety depression bipolar disorder CHF depression emphysema MI hypertension hyperlipidemia and substance abuse along with history of seizures with initial suspicion for noncompliance to medications-Depakote, with slightly  supratherapeutic Depakote level upon arrival coming with complaints of inability to talk-evaluated as a code stroke.  Negative MRI of the brain. Other differentials include seizures versus toxic metabolic encephalopathy Ammonia level is elevated-might be hepatic encephalopathy/hyperammonemia causing the symptoms. Spot EEG and long-term EEG have been negative-no ongoing seizures at this time.  Recommendations: Due to the fact that his ammonia level is elevated, and the Depakote levels are supratherapeutic, I would recommend making adjustments to the Depakote dose. He is currently on Depakote 500 mg-3 times a day. I will change his Depakote to 500 mg twice daily. I would recommend checking the levels in 5 to 7 days as outpatient For hyperammonemia, I would treat him with lactulose. Recheck ammonia levels tomorrow  Plan was relayed to Dr. Posey Pronto via our secure chat -- Amie Portland, MD Neurologist [facility] Pager: [phone]

## 2021-12-26 NOTE — Procedures (Signed)
Patient Name: [name]  MRN: [id number]  Epilepsy Attending: Lora Havens  Referring Physician/Provider: Regan Lemming, MD  Date: 12/25/2021 Duration: 22.53 mins  Patient history: : 66 year old with past medical history of substance abuse, seizures with questionable compliance to Depakote presenting for evaluation of sudden inability to talk. EEG to evaluate for seizure  Level of alertness: Awake  AEDs during EEG study: VPA  Technical aspects: This EEG study was done with scalp electrodes positioned according to the 10-20 International system of electrode placement. Electrical activity was acquired at a sampling rate of '500Hz'$  and reviewed with a high frequency filter of '70Hz'$  and a low frequency filter of '1Hz'$ . EEG data were recorded continuously and digitally stored.   Description: The posterior dominant rhythm consists of 8 Hz activity of moderate voltage (25-35 uV) seen predominantly in posterior head regions, symmetric and reactive to eye opening and eye closing.  Hyperventilation and photic stimulation were not performed.     IMPRESSION: This study is within normal limits. No seizures or epileptiform discharges were seen throughout the recording.  [name]

## 2021-12-26 NOTE — Progress Notes (Signed)
  Progress Note Patient: [name] ZGY:[id number] DOB: Mar 10, 1956 DOA: 12/25/2021  DOS: the patient was seen and examined on 12/26/2021  Brief hospital course: Past medical history of HTN, HLD, COPD, bipolar disorder, seizure disorder cannabis use.  Now present to the hospital with complaints of aphasia.  Stroke and TIA ruled out. At the time of my evaluation patient reports anxiety but no other acute complaints. Neurology consulted.  Depakote level elevated.  Possible toxicity cannot be ruled out. Assessment and Plan: Aphasia. Etiology not clear. MRI brain and CT scan of the head unremarkable for any acute abnormality. Does not appear to have any stroke or TIA-like picture. Neurology evaluating the patient.  No further stroke work-up.  Acute toxic encephalopathy secondary to Depakote. Patient's Depakote levels are more than 120.  Ammonia level also elevated. Depakote dose currently reduced. Patient started on lactulose. Will recheck ammonia level tomorrow. PT OT consulted.  Mood disorder. Anxiety. Insomnia. Continue patient's home regimen.  Substance abuse. Possibility of cannabis induced encephalopathy cannot ruled out as well. Patient is counseled to quit abusing substances.  Seizure disorder. LTM EEG currently ongoing. Appreciate neurological input.  HTN. Continuing current regimen.  HLD. Continuing statin.  Liver cirrhosis. Thrombocytopenia. Monitor for now. No evidence of ascites. Platelet count stable and mostly low secondary to liver cirrhosis and alcohol but Depakote induced thrombocytopenia cannot be ruled out as well. Currently avoiding chemical prophylaxis for DVT due to increased risk.  Anxiety. Added Ativan.  Class I obesity.  No morbid obesity. Placing the patient at higher risk of poor outcome. Body mass index is 31.78 kg/m.   Subjective: No nausea no vomiting no fever no chills.  No chest pain abdominal pain.  Reports anxiety and tremors.   Initially started to leave the hospital.  Currently agreeable to stay after discussion.  Physical Exam: Vitals:   12/26/21 1135 12/26/21 1300 12/26/21 1345 12/26/21 1528  BP: 113/78  (!) 142/74 120/68  Pulse: 60 68 62 60  Resp: '19 14 13 16  '$ Temp:    98.6 F (37 C)  TempSrc:    Oral  SpO2: 95% 96% 93% 94%  Weight:      Height:       General: Appear in mild distress; no visible Abnormal Neck Mass Or lumps, Conjunctiva normal Cardiovascular: S1 and S2 Present, no Murmur, Respiratory: good respiratory effort, Bilateral Air entry present and CTA, no Crackles, no wheezes Abdomen: Bowel Sound present, Non tender  Extremities: no Pedal edema Neurology: alert and oriented to time, place, and person  Gait not checked due to patient safety concerns   Data Reviewed: I have Reviewed nursing notes, Vitals, and Lab results since pt's last encounter. Pertinent lab results CBC and BMP I have ordered test including CBC and BMP I have discussed pt's care plan and test results with neurology.   Family Communication: None at bedside  Disposition: Status is: Inpatient Remains inpatient appropriate because: Further work-up to rule out acute seizures and medication adjustment requiring close observation as well as frequent therapeutic level check.  Author: Berle Mull, MD 12/26/2021 4:47 PM  Please look on [email or web address] to find out who is on call.

## 2021-12-26 NOTE — ED Notes (Signed)
Attempted to contact pt's family per pt request; no answers, mailboxes full, or numbers provided are not working; pt notified

## 2021-12-26 NOTE — ED Notes (Signed)
Pt transported to ECHO. 

## 2021-12-26 NOTE — Procedures (Addendum)
Patient Name: [name]  MRN: [id number]  Epilepsy Attending: Lora Havens  Referring Physician/Provider: Amie Portland, MD  Duration: 12/25/2021 1959 to 12/26/2021 1336   Patient history: : 66 year old with past medical history of substance abuse, seizures with questionable compliance to Depakote presenting for evaluation of sudden inability to talk. EEG to evaluate for seizure   Level of alertness: Awake, asleep   AEDs during EEG study: VPA   Technical aspects: This EEG study was done with scalp electrodes positioned according to the 10-20 International system of electrode placement. Electrical activity was acquired at a sampling rate of '500Hz'$  and reviewed with a high frequency filter of '70Hz'$  and a low frequency filter of '1Hz'$ . EEG data were recorded continuously and digitally stored.    Description: The posterior dominant rhythm consists of 8 Hz activity of moderate voltage (25-35 uV) seen predominantly in posterior head regions, symmetric and reactive to eye opening and eye closing.  Sleep was characterized by sleep spindles (12 to 14 Hz), maximal frontocentral region.  Hyperventilation and photic stimulation were not performed.      IMPRESSION: This study is within normal limits. No seizures or epileptiform discharges were seen throughout the recording.   Jazlynn Nemetz Barbra Sarks

## 2021-12-26 NOTE — Progress Notes (Signed)
vLTM maintenance  All impedances below 10kohms.  No noted skin breakdown at all skin sites

## 2021-12-26 NOTE — Progress Notes (Signed)
vLTM discontinued.  No skin breakdown noted at all skin sites 

## 2021-12-27 DIAGNOSIS — R569 Unspecified convulsions: Secondary | ICD-10-CM | POA: Diagnosis not present

## 2021-12-27 LAB — AMMONIA: Ammonia: 60 umol/L — ABNORMAL HIGH (ref 9–35)

## 2021-12-27 MED ORDER — LACTULOSE 10 GM/15ML PO SOLN: 20.0000 g | Freq: Two times a day (BID) | ORAL | 0 refills | Status: DC

## 2021-12-27 MED ORDER — THIAMINE HCL 100 MG PO TABS: 100.0000 mg | ORAL_TABLET | Freq: Every day | ORAL | 0 refills | Status: DC

## 2021-12-27 MED ORDER — ASPIRIN 81 MG PO TBEC: 81.0000 mg | DELAYED_RELEASE_TABLET | Freq: Every day | ORAL | 2 refills | Status: DC

## 2021-12-27 MED ORDER — FOLIC ACID 1 MG PO TABS: 1.0000 mg | ORAL_TABLET | Freq: Every day | ORAL | 0 refills | Status: DC

## 2021-12-27 MED ORDER — DIVALPROEX SODIUM 500 MG PO DR TAB: 500.0000 mg | DELAYED_RELEASE_TABLET | Freq: Two times a day (BID) | ORAL | 0 refills | Status: DC

## 2021-12-27 NOTE — Progress Notes (Signed)
OT Cancellation Note  Patient Details Name: [name] MRN: [id number] DOB: 1956/04/02   Cancelled Treatment:    Reason Eval/Treat Not Completed: OT screened, no needs identified, will sign off. Pt up in the hallway, dressed and mobilizing with no difficulties. He appears and reports that he is back at his baseline and has no further skilled OT needs. Acute OT will sign off. Please re-consult if needs change.   Jaiquan Temme Elane Assunta Pupo 12/27/2021, 9:10 AM

## 2021-12-27 NOTE — Progress Notes (Addendum)
Neurology Progress Note   S:// Seen and examined.  No recurrence of spells Feels better   O:// Current vital signs: BP 108/74   Pulse 62   Temp 98 F (36.7 C) (Oral)   Resp 16   Ht '5\' 8"'$  (1.727 m)   Wt 94.8 kg   SpO2 94%   BMI 31.78 kg/m  Vital signs in last 24 hours: Temp:  [98 F (36.7 C)-98.6 F (37 C)] 98 F (36.7 C) (07/14 0300) Pulse Rate:  [58-68] 62 (07/14 0824) Resp:  [10-20] 16 (07/14 0300) BP: (97-142)/(66-84) 108/74 (07/14 0824) SpO2:  [91 %-97 %] 94 % (07/14 0300) General: Slightly disheveled, in no acute distress HEENT: Normocephalic atraumatic Lungs: Clear Cardiovascular: Regular rate rhythm Abdomen nondistended nontender Neurological exam He is awake alert oriented x3 His speech is somewhat slow but not truly dysarthric. No evidence of aphasia Mildly reduced attention concentration Cranial nerves II to XII intact Motor examination with no drift in any of the force Sensation intact light touch without extinction Coordination examination with no dysmetria Gait deferred NIH 0 again-unchanged exam from yesterday.  Medications  Current Facility-Administered Medications:    acetaminophen (TYLENOL) tablet 650 mg, 650 mg, Oral, Q6H PRN **OR** acetaminophen (TYLENOL) suppository 650 mg, 650 mg, Rectal, Q6H PRN, George Hugh, MD   albuterol (PROVENTIL) (2.5 MG/3ML) 0.083% nebulizer solution 2.5 mg, 2.5 mg, Nebulization, Q2H PRN, George Hugh, MD   aspirin chewable tablet 81 mg, 81 mg, Oral, Daily, Masoud, Hannah, MD, 81 mg at 12/27/21 0824   atorvastatin (LIPITOR) tablet 20 mg, 20 mg, Oral, QHS, Masoud, Hannah, MD, 20 mg at 12/26/21 2135   busPIRone (BUSPAR) tablet 15 mg, 15 mg, Oral, TID, George Hugh, MD, 15 mg at 12/27/21 0823   divalproex (DEPAKOTE) DR tablet 500 mg, 500 mg, Oral, Q12H, Amie Portland, MD, 500 mg at 51/76/16 0737   folic acid (FOLVITE) tablet 1 mg, 1 mg, Oral, Daily, Masoud, Hannah, MD, 1 mg at 12/27/21 0823   lactulose  (CHRONULAC) 10 GM/15ML solution 20 g, 20 g, Oral, TID, Amie Portland, MD, 20 g at 12/27/21 0825   LORazepam (ATIVAN) tablet 0.5 mg, 0.5 mg, Oral, Q6H PRN, Lavina Hamman, MD, 0.5 mg at 12/27/21 0824   metoprolol succinate (TOPROL-XL) 24 hr tablet 12.5 mg, 12.5 mg, Oral, Daily, Masoud, Jarrett Soho, MD, 12.5 mg at 12/27/21 0824   mometasone-formoterol (DULERA) 100-5 MCG/ACT inhaler 2 puff, 2 puff, Inhalation, BID, Masoud, Jarrett Soho, MD, 2 puff at 12/26/21 1005   ondansetron (ZOFRAN) tablet 4 mg, 4 mg, Oral, Q6H PRN **OR** ondansetron (ZOFRAN) injection 4 mg, 4 mg, Intravenous, Q6H PRN, George Hugh, MD   Oral care mouth rinse, 15 mL, Mouth Rinse, 4 times per day, Lavina Hamman, MD, 15 mL at 12/27/21 0829   Oral care mouth rinse, 15 mL, Mouth Rinse, PRN, Lavina Hamman, MD   OXcarbazepine (TRILEPTAL) tablet 150 mg, 150 mg, Oral, BID, Lavina Hamman, MD, 150 mg at 12/27/21 1062   QUEtiapine (SEROQUEL XR) 24 hr tablet 300 mg, 300 mg, Oral, QHS, Masoud, Hannah, MD, 300 mg at 12/26/21 2135   senna-docusate (Senokot-S) tablet 1 tablet, 1 tablet, Oral, QHS PRN, George Hugh, MD   sertraline (ZOLOFT) tablet 200 mg, 200 mg, Oral, Daily, Masoud, Hannah, MD, 200 mg at 12/27/21 0824   sodium chloride flush (NS) 0.9 % injection 3 mL, 3 mL, Intravenous, Once, Regan Lemming, MD   tamsulosin [facility]) capsule 0.8 mg, 0.8 mg, Oral, Daily, Masoud, Jarrett Soho, MD, 0.8 mg at 12/27/21  2725   thiamine tablet 100 mg, 100 mg, Oral, Daily, George Hugh, MD, 100 mg at 12/27/21 3664   traZODone (DESYREL) tablet 50 mg, 50 mg, Oral, QHS PRN, George Hugh, MD, 50 mg at 12/25/21 2144 Labs CBC    Component Value Date/Time   WBC 4.3 12/26/2021 0300   RBC 4.41 12/26/2021 0300   HGB 14.4 12/26/2021 0300   HGB 15.5 07/26/2020 1101   HGB 14.4 03/09/2017 1550   HCT 42.2 12/26/2021 0300   HCT 46.0 07/26/2020 1101   HCT 41.7 03/09/2017 1550   PLT 73 (L) 12/26/2021 0300   PLT 97 (LL) 07/26/2020 1101   MCV 95.7 12/26/2021 0300    MCV 95 07/26/2020 1101   MCV 93.5 03/09/2017 1550   MCH 32.7 12/26/2021 0300   MCHC 34.1 12/26/2021 0300   RDW 13.2 12/26/2021 0300   RDW 14.0 07/26/2020 1101   RDW 14.6 03/09/2017 1550   LYMPHSABS 1.2 12/25/2021 1504   LYMPHSABS 0.8 04/13/2019 0941   LYMPHSABS 1.3 03/09/2017 1550   MONOABS 0.5 12/25/2021 1504   MONOABS 0.7 03/09/2017 1550   EOSABS 0.1 12/25/2021 1504   EOSABS 0.1 04/13/2019 0941   EOSABS 0.3 02/02/2013 0555   BASOSABS 0.0 12/25/2021 1504   BASOSABS 0.0 04/13/2019 0941   BASOSABS 0.0 03/09/2017 1550    CMP     Component Value Date/Time   NA 141 12/26/2021 0300   NA 141 07/26/2020 1101   NA 143 03/09/2017 1550   K 3.5 12/26/2021 0300   K 4.2 03/09/2017 1550   CL 111 12/26/2021 0300   CL 110 (H) 06/08/2013 1510   CO2 24 12/26/2021 0300   CO2 25 03/09/2017 1550   GLUCOSE 129 (H) 12/26/2021 0300   GLUCOSE 91 03/09/2017 1550   BUN 13 12/26/2021 0300   BUN 14 07/26/2020 1101   BUN 17.2 03/09/2017 1550   CREATININE 0.66 12/26/2021 0300   CREATININE 0.9 03/09/2017 1550   CALCIUM 8.9 12/26/2021 0300   CALCIUM 9.1 03/09/2017 1550   PROT 6.2 (L) 12/25/2021 1504   PROT 6.8 07/26/2020 1101   PROT 6.4 03/09/2017 1550   ALBUMIN 3.8 12/25/2021 1504   ALBUMIN 4.9 (H) 07/26/2020 1101   ALBUMIN 3.8 03/09/2017 1550   AST 30 12/25/2021 1504   AST 28 03/09/2017 1550   ALT 21 12/25/2021 1504   ALT 24 03/09/2017 1550   ALKPHOS 47 12/25/2021 1504   ALKPHOS 52 03/09/2017 1550   BILITOT 0.5 12/25/2021 1504   BILITOT 0.3 07/26/2020 1101   BILITOT 0.56 03/09/2017 1550   GFRNONAA >60 12/26/2021 0300   GFRNONAA >89 10/16/2015 1503   GFRAA 104 07/26/2020 1101   GFRAA >89 10/16/2015 1503  Ammonia 60  Imaging I have reviewed images in epic and the results pertinent to this consultation are: Negative MRI examination of the brain  Neurodiagnostics Spot EEG normal. LTM EEG-no seizures   Assessment: 66 year old past history of anxiety depression bipolar disorder CHF  depression emphysema MI hypertension hyperlipidemia and substance abuse along with history of seizures with initial suspicion for noncompliance to medications-Depakote, with slightly supratherapeutic Depakote level upon arrival coming with complaints of inability to talk-evaluated as a code stroke.  Negative MRI of the brain. Other differentials include seizures versus toxic metabolic encephalopathy Ammonia level is elevated-might be hepatic encephalopathy/hyperammonemia causing the symptoms. Spot EEG and long-term EEG have been negative-no ongoing seizures at this time.  Recommendations: Due to the fact that his ammonia level was  elevated, and the Depakote levels were  supratherapeutic, I recommended making adjustments to the Depakote dose. Now on Depakote 500 BID Also on home Oxcarb at  home and is  compliant - continue home dose I would recommend checking the Depakote levels in 5 to 7 days as outpatient Management of hyperammonemia per IM-- ammonia trending down Outpatient f/u 4-6 weeks Plan was relayed to Dr. Posey Pronto via our secure chat -- Amie Portland, MD Neurologist [facility] Pager: [phone]

## 2021-12-27 NOTE — Evaluation (Signed)
Physical Therapy Evaluation and Discharge Patient Details Name: [name] MRN: [id number] DOB: [birth year] Today's Date: 12/27/2021  History of Present Illness  Pt is a 66 y/o male admitted secondary to aphasia. Also found to have Acute toxic encephalopathy secondary to Depakote. MRI negative. PMH includes seizures, CVA, bipolar disorder, COPD, CHF, HTN, and drug abuse.  Clinical Impression  Patient evaluated by Physical Therapy with no further acute PT needs identified. All education has been completed and the patient has no further questions. Pt overall steady, with no LOB noted. Overall at a Mod I level with transfers, gait, and stair navigation using cane. Reports neighbor and sister checks on him. Pt reports feeling he is back at his baseline. See below for any follow-up Physical Therapy or equipment needs. PT is signing off. Thank you for this referral. If needs change, please re-consult.         Recommendations for follow up therapy are one component of a multi-disciplinary discharge planning process, led by the attending physician.  Recommendations may be updated based on patient status, additional functional criteria and insurance authorization.  Follow Up Recommendations No PT follow up      Assistance Recommended at Discharge PRN  Patient can return home with the following  Assist for transportation    Equipment Recommendations None recommended by PT  Recommendations for Other Services       Functional Status Assessment Patient has had a recent decline in their functional status and demonstrates the ability to make significant improvements in function in a reasonable and predictable amount of time.     Precautions / Restrictions Precautions Precautions: None Restrictions Weight Bearing Restrictions: No      Mobility  Bed Mobility               General bed mobility comments: Standing in room upon entry    Transfers Overall transfer level: Modified  independent                      Ambulation/Gait Ambulation/Gait assistance: Modified independent (Device/Increase time) Gait Distance (Feet): 150 Feet Assistive device: Straight cane Gait Pattern/deviations: WFL(Within Functional Limits) Gait velocity: Decreased     General Gait Details: Mildly slower gait speed, but no LOB.  Stairs Stairs: Yes Stairs assistance: Modified independent (Device/Increase time) Stair Management: One rail Left, Alternating pattern, Forwards Number of Stairs: 2 General stair comments: No LOB noted.  Wheelchair Mobility    Modified Rankin (Stroke Patients Only)       Balance Overall balance assessment: No apparent balance deficits (not formally assessed)                                           Pertinent Vitals/Pain Pain Assessment Pain Assessment: No/denies pain    Home Living Family/patient expects to be discharged to:: Private residence Living Arrangements: Alone Available Help at Discharge: Family;Neighbor;Available PRN/intermittently Type of Home: Mobile home Home Access: Stairs to enter Entrance Stairs-Rails: Left Entrance Stairs-Number of Steps: 5   Home Layout: One level Home Equipment: Cane - single point;Shower seat;Grab bars - tub/shower;Grab bars - toilet      Prior Function Prior Level of Function : Independent/Modified Independent             Mobility Comments: Uses cane for ambulation       Hand Dominance        Extremity/Trunk Assessment  Upper Extremity Assessment Upper Extremity Assessment: Overall WFL for tasks assessed    Lower Extremity Assessment Lower Extremity Assessment: Overall WFL for tasks assessed    Cervical / Trunk Assessment Cervical / Trunk Assessment: Normal  Communication   Communication: No difficulties  Cognition Arousal/Alertness: Awake/alert Behavior During Therapy: WFL for tasks assessed/performed Overall Cognitive Status: No family/caregiver  present to determine baseline cognitive functioning                                          General Comments      Exercises     Assessment/Plan    PT Assessment Patient does not need any further PT services  PT Problem List         PT Treatment Interventions      PT Goals (Current goals can be found in the Care Plan section)  Acute Rehab PT Goals Patient Stated Goal: to go home PT Goal Formulation: With patient Time For Goal Achievement: 12/27/21 Potential to Achieve Goals: Good    Frequency       Co-evaluation               AM-PAC PT "6 Clicks" Mobility  Outcome Measure Help needed turning from your back to your side while in a flat bed without using bedrails?: None Help needed moving from lying on your back to sitting on the side of a flat bed without using bedrails?: None Help needed moving to and from a bed to a chair (including a wheelchair)?: None Help needed standing up from a chair using your arms (e.g., wheelchair or bedside chair)?: None Help needed to walk in hospital room?: None Help needed climbing 3-5 steps with a railing? : None 6 Click Score: 24    End of Session Equipment Utilized During Treatment: Gait belt Activity Tolerance: Patient tolerated treatment well Patient left: in bed;with call bell/phone within reach Nurse Communication: Mobility status PT Visit Diagnosis: Other symptoms and signs involving the nervous system (X90.240)    Time: 9735-3299 PT Time Calculation (min) (ACUTE ONLY): 12 min   Charges:   PT Evaluation $PT Eval Low Complexity: 1 Low          Lou Miner, DPT  Acute Rehabilitation Services  Office: [phone]   Rudean Hitt 12/27/2021, 9:20 AM

## 2021-12-27 NOTE — TOC Transition Note (Signed)
Transition of Care [facility]) - CM/SW Discharge Note   Patient Details  Name: [name] MRN: [id number] Date of Birth: 24-May-1956  Transition of Care [facility]) CM/SW Contact:  Pollie Friar, RN Phone Number: 12/27/2021, 9:53 AM   Clinical Narrative:    Pt discharging home with self care. No needs per TOC.   Final next level of care: Home/Self Care Barriers to Discharge: No Barriers Identified   Patient Goals and CMS Choice        Discharge Placement                       Discharge Plan and Services                                     Social Determinants of Health (SDOH) Interventions     Readmission Risk Interventions     No data to display

## 2021-12-27 NOTE — Care Management Important Message (Signed)
Important Message  Patient Details  Name: [name] MRN: [id number] Date of Birth: 1956/01/21   Medicare Important Message Given:  Yes     [name] [address] 12/27/2021, 3:18 PM

## 2021-12-27 NOTE — Progress Notes (Addendum)
Patient stating he is leaving AMA, patient is dressed and has removed cardiac monitoring. MD alerted. Per Dr. Posey Pronto patient is to discharge this AM. Advised patient and he stated he will wait for discharge.

## 2021-12-29 ENCOUNTER — Encounter (HOSPITAL_COMMUNITY): Payer: Self-pay

## 2021-12-29 ENCOUNTER — Inpatient Hospital Stay (HOSPITAL_COMMUNITY)
Admission: EM | Admit: 2021-12-29 | Discharge: 2022-01-02 | DRG: 641 | Disposition: A | Discharge status: Home Health | Payer: 59 | Attending: Internal Medicine | Admitting: Internal Medicine

## 2021-12-29 ENCOUNTER — Emergency Department (HOSPITAL_COMMUNITY): Payer: 59

## 2021-12-29 ENCOUNTER — Other Ambulatory Visit: Payer: Self-pay

## 2021-12-29 DIAGNOSIS — Z91138 Patient's unintentional underdosing of medication regimen for other reason: Secondary | ICD-10-CM

## 2021-12-29 DIAGNOSIS — T426X6A Underdosing of other antiepileptic and sedative-hypnotic drugs, initial encounter: Secondary | ICD-10-CM | POA: Diagnosis present

## 2021-12-29 DIAGNOSIS — N179 Acute kidney failure, unspecified: Secondary | ICD-10-CM | POA: Diagnosis not present

## 2021-12-29 DIAGNOSIS — E872 Acidosis, unspecified: Secondary | ICD-10-CM

## 2021-12-29 DIAGNOSIS — Z8619 Personal history of other infectious and parasitic diseases: Secondary | ICD-10-CM

## 2021-12-29 DIAGNOSIS — G40909 Epilepsy, unspecified, not intractable, without status epilepticus: Secondary | ICD-10-CM | POA: Diagnosis present

## 2021-12-29 DIAGNOSIS — Z823 Family history of stroke: Secondary | ICD-10-CM

## 2021-12-29 DIAGNOSIS — K7460 Unspecified cirrhosis of liver: Secondary | ICD-10-CM | POA: Diagnosis present

## 2021-12-29 DIAGNOSIS — X31XXXA Exposure to excessive natural cold, initial encounter: Secondary | ICD-10-CM

## 2021-12-29 DIAGNOSIS — W1789XA Other fall from one level to another, initial encounter: Secondary | ICD-10-CM | POA: Diagnosis present

## 2021-12-29 DIAGNOSIS — I110 Hypertensive heart disease with heart failure: Secondary | ICD-10-CM | POA: Diagnosis present

## 2021-12-29 DIAGNOSIS — Z87898 Personal history of other specified conditions: Secondary | ICD-10-CM

## 2021-12-29 DIAGNOSIS — T796XXA Traumatic ischemia of muscle, initial encounter: Secondary | ICD-10-CM

## 2021-12-29 DIAGNOSIS — E860 Dehydration: Principal | ICD-10-CM | POA: Diagnosis present

## 2021-12-29 DIAGNOSIS — I159 Secondary hypertension, unspecified: Secondary | ICD-10-CM

## 2021-12-29 DIAGNOSIS — F319 Bipolar disorder, unspecified: Secondary | ICD-10-CM | POA: Diagnosis present

## 2021-12-29 DIAGNOSIS — Z8249 Family history of ischemic heart disease and other diseases of the circulatory system: Secondary | ICD-10-CM

## 2021-12-29 DIAGNOSIS — N400 Enlarged prostate without lower urinary tract symptoms: Secondary | ICD-10-CM | POA: Diagnosis present

## 2021-12-29 DIAGNOSIS — F17290 Nicotine dependence, other tobacco product, uncomplicated: Secondary | ICD-10-CM | POA: Diagnosis present

## 2021-12-29 DIAGNOSIS — M6282 Rhabdomyolysis: Secondary | ICD-10-CM | POA: Diagnosis present

## 2021-12-29 DIAGNOSIS — Z818 Family history of other mental and behavioral disorders: Secondary | ICD-10-CM

## 2021-12-29 DIAGNOSIS — Z8673 Personal history of transient ischemic attack (TIA), and cerebral infarction without residual deficits: Secondary | ICD-10-CM

## 2021-12-29 DIAGNOSIS — J449 Chronic obstructive pulmonary disease, unspecified: Secondary | ICD-10-CM | POA: Diagnosis present

## 2021-12-29 DIAGNOSIS — Z7951 Long term (current) use of inhaled steroids: Secondary | ICD-10-CM

## 2021-12-29 DIAGNOSIS — R0902 Hypoxemia: Secondary | ICD-10-CM

## 2021-12-29 DIAGNOSIS — Z79899 Other long term (current) drug therapy: Secondary | ICD-10-CM

## 2021-12-29 DIAGNOSIS — I252 Old myocardial infarction: Secondary | ICD-10-CM

## 2021-12-29 DIAGNOSIS — D72819 Decreased white blood cell count, unspecified: Secondary | ICD-10-CM | POA: Diagnosis not present

## 2021-12-29 DIAGNOSIS — S43014A Anterior dislocation of right humerus, initial encounter: Secondary | ICD-10-CM

## 2021-12-29 DIAGNOSIS — T69029A Immersion foot, unspecified foot, initial encounter: Secondary | ICD-10-CM

## 2021-12-29 DIAGNOSIS — Z7982 Long term (current) use of aspirin: Secondary | ICD-10-CM

## 2021-12-29 DIAGNOSIS — D72829 Elevated white blood cell count, unspecified: Secondary | ICD-10-CM | POA: Diagnosis present

## 2021-12-29 DIAGNOSIS — I5032 Chronic diastolic (congestive) heart failure: Secondary | ICD-10-CM | POA: Diagnosis present

## 2021-12-29 DIAGNOSIS — Z23 Encounter for immunization: Secondary | ICD-10-CM

## 2021-12-29 DIAGNOSIS — T69022A Immersion foot, left foot, initial encounter: Secondary | ICD-10-CM | POA: Diagnosis present

## 2021-12-29 DIAGNOSIS — F411 Generalized anxiety disorder: Secondary | ICD-10-CM | POA: Diagnosis present

## 2021-12-29 DIAGNOSIS — E785 Hyperlipidemia, unspecified: Secondary | ICD-10-CM | POA: Diagnosis present

## 2021-12-29 DIAGNOSIS — T69021A Immersion foot, right foot, initial encounter: Secondary | ICD-10-CM | POA: Diagnosis present

## 2021-12-29 DIAGNOSIS — Z85828 Personal history of other malignant neoplasm of skin: Secondary | ICD-10-CM

## 2021-12-29 LAB — COMPREHENSIVE METABOLIC PANEL
ALT: 40 U/L (ref 0–44)
AST: 79 U/L — ABNORMAL HIGH (ref 15–41)
Albumin: 4.4 g/dL (ref 3.5–5.0)
Alkaline Phosphatase: 58 U/L (ref 38–126)
Anion gap: 20 — ABNORMAL HIGH (ref 5–15)
BUN: 34 mg/dL — ABNORMAL HIGH (ref 8–23)
CO2: 18 mmol/L — ABNORMAL LOW (ref 22–32)
Calcium: 9.6 mg/dL (ref 8.9–10.3)
Chloride: 103 mmol/L (ref 98–111)
Creatinine, Ser: 1.44 mg/dL — ABNORMAL HIGH (ref 0.61–1.24)
GFR, Estimated: 53 mL/min — ABNORMAL LOW (ref >60)
Glucose, Bld: 139 mg/dL — ABNORMAL HIGH (ref 70–99)
Potassium: 4.5 mmol/L (ref 3.5–5.1)
Sodium: 141 mmol/L (ref 135–145)
Total Bilirubin: 2.2 mg/dL — ABNORMAL HIGH (ref 0.3–1.2)
Total Protein: 7.2 g/dL (ref 6.5–8.1)

## 2021-12-29 LAB — CBC WITH DIFFERENTIAL/PLATELET
Abs Immature Granulocytes: 0.11 10*3/uL — ABNORMAL HIGH (ref 0.00–0.07)
Basophils Absolute: 0 10*3/uL (ref 0.0–0.1)
Basophils Relative: 0 %
Eosinophils Absolute: 0 10*3/uL (ref 0.0–0.5)
Eosinophils Relative: 0 %
HCT: 45.8 % (ref 39.0–52.0)
Hemoglobin: 16.2 g/dL (ref 13.0–17.0)
Immature Granulocytes: 1 %
Lymphocytes Relative: 5 %
Lymphs Abs: 0.9 10*3/uL (ref 0.7–4.0)
MCH: 32.9 pg (ref 26.0–34.0)
MCHC: 35.4 g/dL (ref 30.0–36.0)
MCV: 93.1 fL (ref 80.0–100.0)
Monocytes Absolute: 2.5 10*3/uL — ABNORMAL HIGH (ref 0.1–1.0)
Monocytes Relative: 13 %
Neutro Abs: 15.9 10*3/uL — ABNORMAL HIGH (ref 1.7–7.7)
Neutrophils Relative %: 81 %
Platelets: 125 10*3/uL — ABNORMAL LOW (ref 150–400)
RBC: 4.92 MIL/uL (ref 4.22–5.81)
RDW: 13.5 % (ref 11.5–15.5)
WBC: 19.5 10*3/uL — ABNORMAL HIGH (ref 4.0–10.5)
nRBC: 0 % (ref 0.0–0.2)

## 2021-12-29 LAB — URINALYSIS, ROUTINE W REFLEX MICROSCOPIC
Bilirubin Urine: NEGATIVE
Glucose, UA: NEGATIVE mg/dL
Hgb urine dipstick: NEGATIVE
Ketones, ur: 20 mg/dL — AB
Leukocytes,Ua: NEGATIVE
Nitrite: NEGATIVE
Protein, ur: 100 mg/dL — AB
Specific Gravity, Urine: 1.026 (ref 1.005–1.030)
pH: 5 (ref 5.0–8.0)

## 2021-12-29 LAB — I-STAT VENOUS BLOOD GAS, ED
Acid-base deficit: 3 mmol/L — ABNORMAL HIGH (ref 0.0–2.0)
Bicarbonate: 21.3 mmol/L (ref 20.0–28.0)
Calcium, Ion: 1.11 mmol/L — ABNORMAL LOW (ref 1.15–1.40)
HCT: 46 % (ref 39.0–52.0)
Hemoglobin: 15.6 g/dL (ref 13.0–17.0)
O2 Saturation: 84 %
Potassium: 4.3 mmol/L (ref 3.5–5.1)
Sodium: 141 mmol/L (ref 135–145)
TCO2: 22 mmol/L (ref 22–32)
pCO2, Ven: 34.8 mmHg — ABNORMAL LOW (ref 44–60)
pH, Ven: 7.394 (ref 7.25–7.43)
pO2, Ven: 48 mmHg — ABNORMAL HIGH (ref 32–45)

## 2021-12-29 LAB — BRAIN NATRIURETIC PEPTIDE: B Natriuretic Peptide: 69.3 pg/mL (ref 0.0–100.0)

## 2021-12-29 LAB — CK: Total CK: 1792 U/L — ABNORMAL HIGH (ref 49–397)

## 2021-12-29 LAB — AMMONIA: Ammonia: 17 umol/L (ref 9–35)

## 2021-12-29 LAB — LACTIC ACID, PLASMA: Lactic Acid, Venous: 2 mmol/L (ref 0.5–1.9)

## 2021-12-29 MED ORDER — TETANUS-DIPHTH-ACELL PERTUSSIS 5-2.5-18.5 LF-MCG/0.5 IM SUSY
0.5000 mL | PREFILLED_SYRINGE | Freq: Once | INTRAMUSCULAR | Status: AC
Start: 2021-12-29 — End: 2021-12-29
  Administered 2021-12-29: 0.5 mL via INTRAMUSCULAR
  Filled 2021-12-29: qty 0.5

## 2021-12-29 MED ORDER — IOHEXOL 300 MG/ML  SOLN
80.0000 mL | Freq: Once | INTRAMUSCULAR | Status: AC | PRN
  Administered 2021-12-29: 80 mL via INTRAVENOUS

## 2021-12-29 MED ORDER — ONDANSETRON HCL 4 MG/2ML IJ SOLN
4.0000 mg | Freq: Once | INTRAMUSCULAR | Status: AC
  Administered 2021-12-29: 4 mg via INTRAVENOUS
  Filled 2021-12-29: qty 2

## 2021-12-29 MED ORDER — MORPHINE SULFATE (PF) 4 MG/ML IV SOLN
4.0000 mg | Freq: Once | INTRAVENOUS | Status: AC
  Administered 2021-12-29: 4 mg via INTRAVENOUS
  Filled 2021-12-29: qty 1

## 2021-12-29 MED ORDER — ONDANSETRON HCL 4 MG/2ML IJ SOLN
4.0000 mg | Freq: Once | INTRAMUSCULAR | Status: AC
  Administered 2021-12-30: 4 mg via INTRAVENOUS
  Filled 2021-12-29: qty 2

## 2021-12-29 MED ORDER — LACTATED RINGERS IV BOLUS
1000.0000 mL | Freq: Once | INTRAVENOUS | Status: AC
  Administered 2021-12-29: 1000 mL via INTRAVENOUS

## 2021-12-29 MED ORDER — FENTANYL CITRATE PF 50 MCG/ML IJ SOSY
100.0000 ug | PREFILLED_SYRINGE | Freq: Once | INTRAMUSCULAR | Status: AC
  Administered 2021-12-29: 100 ug via INTRAVENOUS
  Filled 2021-12-29: qty 2

## 2021-12-29 MED ORDER — PROPOFOL 10 MG/ML IV BOLUS
0.5000 mg/kg | Freq: Once | INTRAVENOUS | Status: AC
  Administered 2021-12-30: 48.8 mg via INTRAVENOUS
  Filled 2021-12-29: qty 20

## 2021-12-29 NOTE — ED Notes (Signed)
Pt's daughter Daneen Schick) would like to be updated with any new results (phone in chart).

## 2021-12-29 NOTE — ED Notes (Signed)
Ice chips given to pt per PA order
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
## 2021-12-29 NOTE — ED Notes (Addendum)
EDP at [facility]. Pt alert, NAD, calm, interactive. C-collar adjusted with precautions. Shoulder reduction with without conscious sedation with benefits and risks explained. Verbalizes consent.

## 2021-12-29 NOTE — ED Notes (Signed)
EDP and PA at [facility] attempting to reduce R shoulder. C/o pain despite recent meds.
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
## 2021-12-30 ENCOUNTER — Emergency Department (HOSPITAL_COMMUNITY): Payer: 59

## 2021-12-30 ENCOUNTER — Encounter (HOSPITAL_COMMUNITY): Payer: Self-pay | Admitting: Internal Medicine

## 2021-12-30 ENCOUNTER — Inpatient Hospital Stay (HOSPITAL_COMMUNITY): Payer: 59

## 2021-12-30 DIAGNOSIS — N179 Acute kidney failure, unspecified: Secondary | ICD-10-CM | POA: Diagnosis present

## 2021-12-30 DIAGNOSIS — J449 Chronic obstructive pulmonary disease, unspecified: Secondary | ICD-10-CM | POA: Diagnosis present

## 2021-12-30 DIAGNOSIS — E860 Dehydration: Secondary | ICD-10-CM | POA: Diagnosis present

## 2021-12-30 DIAGNOSIS — Z87898 Personal history of other specified conditions: Secondary | ICD-10-CM | POA: Diagnosis not present

## 2021-12-30 DIAGNOSIS — X31XXXA Exposure to excessive natural cold, initial encounter: Secondary | ICD-10-CM | POA: Diagnosis not present

## 2021-12-30 DIAGNOSIS — S43014A Anterior dislocation of right humerus, initial encounter: Secondary | ICD-10-CM | POA: Diagnosis present

## 2021-12-30 DIAGNOSIS — R0902 Hypoxemia: Secondary | ICD-10-CM | POA: Diagnosis present

## 2021-12-30 DIAGNOSIS — E785 Hyperlipidemia, unspecified: Secondary | ICD-10-CM | POA: Diagnosis present

## 2021-12-30 DIAGNOSIS — E872 Acidosis, unspecified: Secondary | ICD-10-CM | POA: Diagnosis present

## 2021-12-30 DIAGNOSIS — T69029A Immersion foot, unspecified foot, initial encounter: Secondary | ICD-10-CM | POA: Diagnosis present

## 2021-12-30 DIAGNOSIS — D72829 Elevated white blood cell count, unspecified: Secondary | ICD-10-CM | POA: Diagnosis present

## 2021-12-30 DIAGNOSIS — G40909 Epilepsy, unspecified, not intractable, without status epilepticus: Secondary | ICD-10-CM | POA: Diagnosis present

## 2021-12-30 DIAGNOSIS — T69022A Immersion foot, left foot, initial encounter: Secondary | ICD-10-CM | POA: Diagnosis present

## 2021-12-30 DIAGNOSIS — R569 Unspecified convulsions: Secondary | ICD-10-CM | POA: Diagnosis not present

## 2021-12-30 DIAGNOSIS — F17290 Nicotine dependence, other tobacco product, uncomplicated: Secondary | ICD-10-CM | POA: Diagnosis present

## 2021-12-30 DIAGNOSIS — T426X6A Underdosing of other antiepileptic and sedative-hypnotic drugs, initial encounter: Secondary | ICD-10-CM | POA: Diagnosis present

## 2021-12-30 DIAGNOSIS — D72819 Decreased white blood cell count, unspecified: Secondary | ICD-10-CM | POA: Diagnosis not present

## 2021-12-30 DIAGNOSIS — K7460 Unspecified cirrhosis of liver: Secondary | ICD-10-CM | POA: Diagnosis present

## 2021-12-30 DIAGNOSIS — N400 Enlarged prostate without lower urinary tract symptoms: Secondary | ICD-10-CM | POA: Diagnosis present

## 2021-12-30 DIAGNOSIS — F411 Generalized anxiety disorder: Secondary | ICD-10-CM | POA: Diagnosis present

## 2021-12-30 DIAGNOSIS — M6282 Rhabdomyolysis: Secondary | ICD-10-CM | POA: Diagnosis present

## 2021-12-30 DIAGNOSIS — T796XXA Traumatic ischemia of muscle, initial encounter: Secondary | ICD-10-CM

## 2021-12-30 DIAGNOSIS — W1789XA Other fall from one level to another, initial encounter: Secondary | ICD-10-CM | POA: Diagnosis present

## 2021-12-30 DIAGNOSIS — I110 Hypertensive heart disease with heart failure: Secondary | ICD-10-CM | POA: Diagnosis present

## 2021-12-30 DIAGNOSIS — F319 Bipolar disorder, unspecified: Secondary | ICD-10-CM | POA: Diagnosis present

## 2021-12-30 DIAGNOSIS — Z91138 Patient's unintentional underdosing of medication regimen for other reason: Secondary | ICD-10-CM | POA: Diagnosis not present

## 2021-12-30 DIAGNOSIS — I5032 Chronic diastolic (congestive) heart failure: Secondary | ICD-10-CM | POA: Diagnosis present

## 2021-12-30 DIAGNOSIS — T69021A Immersion foot, right foot, initial encounter: Secondary | ICD-10-CM | POA: Diagnosis present

## 2021-12-30 DIAGNOSIS — Z7951 Long term (current) use of inhaled steroids: Secondary | ICD-10-CM | POA: Diagnosis not present

## 2021-12-30 DIAGNOSIS — Z23 Encounter for immunization: Secondary | ICD-10-CM | POA: Diagnosis not present

## 2021-12-30 LAB — MAGNESIUM
Magnesium: 2.4 mg/dL (ref 1.7–2.4)
Magnesium: 2.5 mg/dL — ABNORMAL HIGH (ref 1.7–2.4)

## 2021-12-30 LAB — COMPREHENSIVE METABOLIC PANEL
ALT: 35 U/L (ref 0–44)
AST: 67 U/L — ABNORMAL HIGH (ref 15–41)
Albumin: 3.7 g/dL (ref 3.5–5.0)
Alkaline Phosphatase: 47 U/L (ref 38–126)
Anion gap: 15 (ref 5–15)
BUN: 30 mg/dL — ABNORMAL HIGH (ref 8–23)
CO2: 22 mmol/L (ref 22–32)
Calcium: 8.8 mg/dL — ABNORMAL LOW (ref 8.9–10.3)
Chloride: 101 mmol/L (ref 98–111)
Creatinine, Ser: 0.96 mg/dL (ref 0.61–1.24)
GFR, Estimated: >60 mL/min (ref >60)
Glucose, Bld: 112 mg/dL — ABNORMAL HIGH (ref 70–99)
Potassium: 4 mmol/L (ref 3.5–5.1)
Sodium: 138 mmol/L (ref 135–145)
Total Bilirubin: 1.4 mg/dL — ABNORMAL HIGH (ref 0.3–1.2)
Total Protein: 6.2 g/dL — ABNORMAL LOW (ref 6.5–8.1)

## 2021-12-30 LAB — CBC WITH DIFFERENTIAL/PLATELET
Abs Immature Granulocytes: 0.06 10*3/uL (ref 0.00–0.07)
Basophils Absolute: 0 10*3/uL (ref 0.0–0.1)
Basophils Relative: 0 %
Eosinophils Absolute: 0 10*3/uL (ref 0.0–0.5)
Eosinophils Relative: 0 %
HCT: 39.8 % (ref 39.0–52.0)
Hemoglobin: 13.9 g/dL (ref 13.0–17.0)
Immature Granulocytes: 0 %
Lymphocytes Relative: 11 %
Lymphs Abs: 1.7 10*3/uL (ref 0.7–4.0)
MCH: 32.8 pg (ref 26.0–34.0)
MCHC: 34.9 g/dL (ref 30.0–36.0)
MCV: 93.9 fL (ref 80.0–100.0)
Monocytes Absolute: 2.3 10*3/uL — ABNORMAL HIGH (ref 0.1–1.0)
Monocytes Relative: 15 %
Neutro Abs: 11.1 10*3/uL — ABNORMAL HIGH (ref 1.7–7.7)
Neutrophils Relative %: 74 %
Platelets: 105 10*3/uL — ABNORMAL LOW (ref 150–400)
RBC: 4.24 MIL/uL (ref 4.22–5.81)
RDW: 13.6 % (ref 11.5–15.5)
WBC: 15.2 10*3/uL — ABNORMAL HIGH (ref 4.0–10.5)
nRBC: 0 % (ref 0.0–0.2)

## 2021-12-30 LAB — PHOSPHORUS: Phosphorus: 4 mg/dL (ref 2.5–4.6)

## 2021-12-30 LAB — LACTIC ACID, PLASMA: Lactic Acid, Venous: 1.6 mmol/L (ref 0.5–1.9)

## 2021-12-30 LAB — CK: Total CK: 1696 U/L — ABNORMAL HIGH (ref 49–397)

## 2021-12-30 MED ORDER — SENNOSIDES-DOCUSATE SODIUM 8.6-50 MG PO TABS
1.0000 | ORAL_TABLET | Freq: Every evening | ORAL | Status: DC | PRN
  Administered 2021-12-30: 1 via ORAL

## 2021-12-30 MED ORDER — ACETAMINOPHEN 650 MG RE SUPP: 650.0000 mg | Freq: Four times a day (QID) | RECTAL | Status: DC | PRN

## 2021-12-30 MED ORDER — SODIUM CHLORIDE 0.9 % IV SOLN
Freq: Once | INTRAVENOUS | Status: AC
Start: 2021-12-30 — End: 2021-12-30

## 2021-12-30 MED ORDER — TAMSULOSIN HCL 0.4 MG PO CAPS
0.8000 mg | ORAL_CAPSULE | Freq: Every day | ORAL | Status: DC
  Administered 2021-12-30 – 2022-01-02 (×4): 0.8 mg via ORAL
  Filled 2021-12-30 (×4): qty 2

## 2021-12-30 MED ORDER — ALBUTEROL SULFATE (2.5 MG/3ML) 0.083% IN NEBU: 2.5000 mg | INHALATION_SOLUTION | RESPIRATORY_TRACT | Status: DC | PRN

## 2021-12-30 MED ORDER — OXYCODONE HCL 5 MG PO TABS
5.0000 mg | ORAL_TABLET | ORAL | Status: DC | PRN
  Administered 2021-12-30 – 2022-01-02 (×5): 5 mg via ORAL
  Filled 2021-12-30 (×5): qty 1

## 2021-12-30 MED ORDER — BUSPIRONE HCL 15 MG PO TABS
15.0000 mg | ORAL_TABLET | Freq: Three times a day (TID) | ORAL | Status: DC
  Administered 2021-12-30 – 2022-01-02 (×10): 15 mg via ORAL
  Filled 2021-12-30 (×4): qty 1
  Filled 2021-12-30 (×2): qty 2
  Filled 2021-12-30 (×4): qty 1

## 2021-12-30 MED ORDER — DIVALPROEX SODIUM 500 MG PO DR TAB
500.0000 mg | DELAYED_RELEASE_TABLET | Freq: Two times a day (BID) | ORAL | Status: DC
  Administered 2021-12-30: 500 mg via ORAL
  Filled 2021-12-30 (×3): qty 1

## 2021-12-30 MED ORDER — DIVALPROEX SODIUM 500 MG PO DR TAB
500.0000 mg | DELAYED_RELEASE_TABLET | Freq: Three times a day (TID) | ORAL | Status: DC
  Administered 2021-12-30 – 2022-01-01 (×5): 500 mg via ORAL
  Filled 2021-12-30 (×6): qty 1

## 2021-12-30 MED ORDER — METOPROLOL SUCCINATE ER 25 MG PO TB24
12.5000 mg | ORAL_TABLET | Freq: Every day | ORAL | Status: DC
  Administered 2021-12-30 – 2022-01-02 (×4): 12.5 mg via ORAL
  Filled 2021-12-30 (×4): qty 1

## 2021-12-30 MED ORDER — ASPIRIN 81 MG PO TBEC
81.0000 mg | DELAYED_RELEASE_TABLET | Freq: Every day | ORAL | Status: DC
  Administered 2021-12-30 – 2022-01-02 (×4): 81 mg via ORAL
  Filled 2021-12-30 (×4): qty 1

## 2021-12-30 MED ORDER — QUETIAPINE FUMARATE ER 200 MG PO TB24
400.0000 mg | ORAL_TABLET | Freq: Every day | ORAL | Status: DC
Start: 2021-12-30 — End: 2022-01-02
  Administered 2021-12-31 – 2022-01-01 (×3): 400 mg via ORAL
  Filled 2021-12-30 (×2): qty 2
  Filled 2021-12-30 (×2): qty 1
  Filled 2021-12-30 (×2): qty 2

## 2021-12-30 MED ORDER — SERTRALINE HCL 100 MG PO TABS
200.0000 mg | ORAL_TABLET | Freq: Every day | ORAL | Status: DC
  Administered 2021-12-30 – 2022-01-02 (×4): 200 mg via ORAL
  Filled 2021-12-30 (×4): qty 2

## 2021-12-30 MED ORDER — CALCIUM GLUCONATE-NACL 2-0.675 GM/100ML-% IV SOLN
2.0000 g | Freq: Once | INTRAVENOUS | Status: AC
  Administered 2021-12-30: 2000 mg via INTRAVENOUS
  Filled 2021-12-30: qty 100

## 2021-12-30 MED ORDER — MOMETASONE FURO-FORMOTEROL FUM 100-5 MCG/ACT IN AERO
2.0000 | INHALATION_SPRAY | Freq: Two times a day (BID) | RESPIRATORY_TRACT | Status: DC
  Administered 2021-12-30 – 2022-01-02 (×7): 2 via RESPIRATORY_TRACT
  Filled 2021-12-30: qty 8.8

## 2021-12-30 MED ORDER — NALOXONE HCL 0.4 MG/ML IJ SOLN: 0.4000 mg | INTRAMUSCULAR | Status: DC | PRN

## 2021-12-30 MED ORDER — IPRATROPIUM-ALBUTEROL 0.5-2.5 (3) MG/3ML IN SOLN: 3.0000 mL | RESPIRATORY_TRACT | Status: DC | PRN

## 2021-12-30 MED ORDER — ENOXAPARIN SODIUM 40 MG/0.4ML IJ SOSY
40.0000 mg | PREFILLED_SYRINGE | INTRAMUSCULAR | Status: DC
Start: 2021-12-30 — End: 2022-01-02
  Administered 2021-12-30 – 2022-01-02 (×4): 40 mg via SUBCUTANEOUS
  Filled 2021-12-30 (×4): qty 0.4

## 2021-12-30 MED ORDER — DEXTROSE 5 % IV SOLN
750.0000 mg | Freq: Once | INTRAVENOUS | Status: AC
  Administered 2021-12-30: 750 mg via INTRAVENOUS
  Filled 2021-12-30: qty 7.5

## 2021-12-30 MED ORDER — FENTANYL CITRATE PF 50 MCG/ML IJ SOSY
50.0000 ug | PREFILLED_SYRINGE | INTRAMUSCULAR | Status: DC | PRN
  Administered 2021-12-30 – 2021-12-31 (×2): 50 ug via INTRAVENOUS
  Filled 2021-12-30 (×2): qty 1

## 2021-12-30 MED ORDER — ONDANSETRON HCL 4 MG/2ML IJ SOLN: 4.0000 mg | Freq: Four times a day (QID) | INTRAMUSCULAR | Status: DC | PRN

## 2021-12-30 MED ORDER — METOPROLOL TARTRATE 5 MG/5ML IV SOLN: 5.0000 mg | INTRAVENOUS | Status: DC | PRN

## 2021-12-30 MED ORDER — ACETAMINOPHEN 325 MG PO TABS: 650.0000 mg | ORAL_TABLET | Freq: Four times a day (QID) | ORAL | Status: DC | PRN

## 2021-12-30 MED ORDER — TRAZODONE HCL 50 MG PO TABS
50.0000 mg | ORAL_TABLET | Freq: Every evening | ORAL | Status: DC | PRN
  Administered 2021-12-31 – 2022-01-01 (×2): 50 mg via ORAL
  Filled 2021-12-30 (×2): qty 1

## 2021-12-30 MED ORDER — PROPOFOL 10 MG/ML IV BOLUS
INTRAVENOUS | Status: AC | PRN
  Administered 2021-12-30 (×2): 25 mg via INTRAVENOUS
  Administered 2021-12-30: 20 mg via INTRAVENOUS

## 2021-12-30 MED ORDER — GUAIFENESIN 100 MG/5ML PO LIQD: 5.0000 mL | ORAL | Status: DC | PRN

## 2021-12-30 MED ORDER — LACTATED RINGERS IV SOLN
INTRAVENOUS | Status: DC
Start: 2021-12-30 — End: 2021-12-30

## 2021-12-30 MED ORDER — SODIUM CHLORIDE 0.9 % IV SOLN: INTRAVENOUS | Status: AC

## 2021-12-30 MED ORDER — OXCARBAZEPINE 150 MG PO TABS
150.0000 mg | ORAL_TABLET | Freq: Two times a day (BID) | ORAL | Status: DC
  Administered 2021-12-30 – 2022-01-02 (×7): 150 mg via ORAL
  Filled 2021-12-30 (×9): qty 1

## 2021-12-30 MED ORDER — HYDRALAZINE HCL 20 MG/ML IJ SOLN: 10.0000 mg | INTRAMUSCULAR | Status: DC | PRN

## 2021-12-30 MED ORDER — LORAZEPAM 2 MG/ML IJ SOLN
1.0000 mg | INTRAMUSCULAR | Status: DC | PRN
  Administered 2021-12-30 – 2021-12-31 (×2): 1 mg via INTRAVENOUS
  Filled 2021-12-30 (×2): qty 1

## 2021-12-30 NOTE — ED Notes (Signed)
RN cleaned pt, New chuck and sheets applied.

## 2021-12-30 NOTE — Progress Notes (Signed)
Patient had couple of witnessed episodes by RN. He received Ativan He was seen by Neurology for similar reason during last admission.  I have consulted Neurology Dr Curly Shores, who will see the patient.   Gerlean Ren MD  [facility]

## 2021-12-30 NOTE — ED Provider Notes (Signed)
.  Sedation  Date/Time: 12/30/2021 12:20 AM  Performed by: Quintella Reichert, MD Authorized by: Quintella Reichert, MD   Consent:    Consent obtained:  Written   Consent given by:  Patient   Risks discussed:  Allergic reaction, dysrhythmia, inadequate sedation, respiratory compromise necessitating ventilatory assistance and intubation, prolonged hypoxia resulting in organ damage, nausea and vomiting   Alternatives discussed:  Analgesia without sedation Universal protocol:    Immediately prior to procedure, a time out was called: yes   Indications:    Procedure performed:  Dislocation reduction   Procedure necessitating sedation performed by:  Different physician Pre-sedation assessment:    Time since last food or drink:  2 days   ASA classification: class 3 - patient with severe systemic disease     Mouth opening:  3 or more finger widths   Mallampati score:  I - soft palate, uvula, fauces, pillars visible   Neck mobility: normal     Pre-sedation assessments completed and reviewed: airway patency, cardiovascular function, hydration status, mental status, nausea/vomiting, pain level and respiratory function   Procedure details (see MAR for exact dosages):    Preoxygenation:  Nasal cannula   Sedation:  Propofol   Intended level of sedation: deep   Analgesia:  Fentanyl   Intra-procedure monitoring:  Blood pressure monitoring, cardiac monitor, continuous capnometry, continuous pulse oximetry, frequent LOC assessments and frequent vital sign checks   Intra-procedure events: none     Total Provider sedation time (minutes):  20 Post-procedure details:    Post-sedation assessments completed and reviewed: airway patency, cardiovascular function, hydration status, mental status, nausea/vomiting, pain level and respiratory function     Procedure completion:  Tolerated well, no immediate complications  Patient here after falling down embankment 2 days ago.  Has a right shoulder dislocation.  He is  neurovascularly intact distally.  Sedation performed by myself, supervised the reduction performed by PA.  Patient tolerated procedure well.  Plan to admit to medicine service postprocedure for ongoing treatment for his medical issues.    Quintella Reichert, MD 12/30/21 [phone]

## 2021-12-30 NOTE — ED Notes (Addendum)
Xray at BS for post reduction film  

## 2021-12-30 NOTE — ED Notes (Signed)
Given ice chips per request.

## 2021-12-30 NOTE — Progress Notes (Signed)
Orthopedic Tech Progress Note Patient Details:  [name] 12/19/1955 [id number]  Ortho Devices Type of Ortho Device: Sling immobilizer Ortho Device/Splint Location: rue Ortho Device/Splint Interventions: Ordered, Application, Adjustment  I applied sling immobilizer post reduction. Post Interventions Patient Tolerated: Well Instructions Provided: Care of device, Adjustment of device  [name] 12/30/2021, 12:41 AM

## 2021-12-30 NOTE — Progress Notes (Signed)
EEG complete - results pending 
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
## 2021-12-30 NOTE — ED Notes (Signed)
C-collar removed by Dr. Ralene Bathe.

## 2021-12-30 NOTE — Progress Notes (Signed)
RT NOTE:  RT at bedside for conscious sedation. ETCO2 w/ 2L [place] placed on patient. AMBU bag and suction ready at bedside. No respiratory intervention needed at this time.

## 2021-12-30 NOTE — ED Notes (Signed)
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 Esophagus is unremarkable. No mediastinal hematoma. No pneumomediastinum. Lungs/Pleura: Lungs are clear. No pneumothorax or pleural effusion. Central airways are widely patent. Musculoskeletal: There is anteroinferior right glenohumeral dislocation. Hill-Sachs deformity noted. No definite superimposed fracture identified. Probable degenerative calcification of the inferior glenoid labrum. Osseous structures are otherwise age-appropriate. CT ABDOMEN PELVIS FINDINGS Hepatobiliary: Cholelithiasis without pericholecystic inflammatory change. Liver contour is diffusely nodular in keeping with changes of cirrhosis. No enhancing intrahepatic mass identified. No intra or extrahepatic biliary ductal dilation. Pancreas: Unremarkable Spleen: Unremarkable Adrenals/Urinary Tract:  Adrenal glands are unremarkable. Kidneys are normal, without renal calculi, focal lesion,  or hydronephrosis. Bladder is unremarkable. Stomach/Bowel: Stomach is within normal limits. Appendix appears normal. No evidence of bowel wall thickening, distention, or inflammatory changes. No free intraperitoneal gas or fluid. Vascular/Lymphatic: Aortic atherosclerosis. No enlarged abdominal or pelvic lymph nodes. Reproductive: Mild prostatic hypertrophy. Other: No abdominal wall hernia. Musculoskeletal: Degenerative changes are seen at the lumbosacral junction. No acute bone abnormality within the abdomen and pelvis. IMPRESSION: 1. No acute intracranial injury. No calvarial fracture. 2. No acute fracture or listhesis of the cervical spine. 3. Anteroinferior right glenohumeral dislocation with Hill-Sachs deformity. No definite superimposed acute fracture identified. 4. No acute intrathoracic or intra-abdominal injury. 5. Moderate coronary artery calcification. 6. Cirrhosis. 7. Cholelithiasis. Electronically Signed   By: Fidela Salisbury M.D.   On: 12/29/2021 23:41   CT L-SPINE NO CHARGE  Result Date: 12/29/2021 CLINICAL DATA:  Initial evaluation for acute trauma. EXAM: CT LUMBAR SPINE WITHOUT CONTRAST TECHNIQUE: Multidetector CT imaging of the lumbar spine was performed without intravenous contrast administration. Multiplanar CT image reconstructions were also generated. RADIATION DOSE REDUCTION: This exam was performed according to the departmental dose-optimization program which includes automated exposure control, adjustment of the mA and/or kV according to patient size and/or use of iterative reconstruction technique. COMPARISON:  Prior MRI from 02/16/2016. FINDINGS: Segmentation: Standard. Lowest well-formed disc space labeled the L5-S1 level. Alignment: Trace degenerative retrolisthesis of L5 on S1. Alignment otherwise normal with preservation of the normal lumbar lordosis. Vertebrae: Vertebral body height maintained without acute or chronic fracture. Visualized sacrum and pelvis intact. SI joints  symmetric and within normal limits. No discrete or worrisome osseous lesions. Paraspinal and other soft tissues: Paraspinous soft tissues demonstrate no acute finding. Aorto bi-iliac atherosclerotic disease. Disc levels: L1-2:  Unremarkable. L2-3: Mild endplate spurring without significant disc bulge. Mild facet spurring. No stenosis. L3-4: Mild disc bulge with endplate spurring. Mild facet hypertrophy. No significant spinal stenosis. Foramina remain patent. L4-5: Mild disc bulge with reactive endplate spurring. Mild facet hypertrophy. Mild bilateral subarticular stenosis. Foramina remain patent. L5-S1: Degenerative intervertebral disc space narrowing with diffuse disc bulge and reactive endplate spurring. Mild to moderate facet hypertrophy. No spinal stenosis. Moderate bilateral L5 foraminal stenosis. IMPRESSION: 1. No acute traumatic injury within the lumbar spine. 2. Degenerative spondylosis at L5-S1 with resultant moderate bilateral L5 foraminal stenosis. 3. Mild bilateral subarticular stenosis at L4-5 related to disc bulge and facet hypertrophy. Aortic Atherosclerosis (ICD10-I70.0).  She Electronically Signed   By: Jeannine Boga M.D.   On: 12/29/2021 23:36   CT T-SPINE NO CHARGE  Result Date: 12/29/2021 CLINICAL DATA:  Initial evaluation for acute trauma. EXAM: CT THORACIC SPINE WITHOUT CONTRAST TECHNIQUE: Multidetector CT images of the thoracic were obtained using the standard protocol without intravenous contrast. RADIATION DOSE REDUCTION: This exam was performed according to the departmental dose-optimization program which includes automated exposure control, adjustment of the mA and/or kV according to patient size and/or use of iterative reconstruction technique. COMPARISON:  None Available. FINDINGS: Alignment: Physiologic with preservation of the normal thoracic kyphosis. No listhesis. Vertebrae: Vertebral body height maintained without acute or chronic fracture. Visualized ribs intact. No  discrete or worrisome osseous lesions. Paraspinal and other soft tissues: Paraspinous soft tissues demonstrate no acute finding. Scattered aortic atherosclerosis. Mild upper lobe predominant centrilobular emphysema. Cholelithiasis noted. Disc levels: Few scattered degenerative endplate Schmorl's node deformities with endplate spurring noted within the mid and lower thoracic spine. No significant spinal stenosis by CT. IMPRESSION: 1. No acute  traumatic injury within the thoracic spine. 2. Mild upper lobe predominant centrilobular emphysema. 3. Cholelithiasis. Aortic Atherosclerosis (ICD10-I70.0) and Emphysema (ICD10-J43.9). Electronically Signed   By: Jeannine Boga M.D.   On: 12/29/2021 23:28   DG Shoulder Right  Result Date: 12/29/2021 CLINICAL DATA:  Pain after fall EXAM: RIGHT SHOULDER - 2+ VIEW COMPARISON:  None Available. FINDINGS: The shoulder is anteriorly dislocated. Mild irregularity is identified along the posterior aspect of the glenoid. AC joint degenerative changes are identified. No fractures. No other acute abnormalities. IMPRESSION: Anterior shoulder dislocation. Irregularity along the posterior lip of the glenoid is age indeterminate but could be secondary to dislocation. Electronically Signed   By: Dorise Bullion III M.D.   On: 12/29/2021 22:15   DG Pelvis 1-2 Views  Result Date: 12/29/2021 CLINICAL DATA:  Found down EXAM: PELVIS - 1-2 VIEW COMPARISON:  07/01/2013 FINDINGS: Single frontal view of the pelvis demonstrates no acute displaced fractures. Mild symmetrical bilateral hip osteoarthritis. Alignment is anatomic. Prominent spondylosis and facet hypertrophy at the lumbosacral junction. IMPRESSION: 1. Degenerative changes of the lumbar spine and bilateral hips. 2. No acute fracture. Electronically Signed   By: Randa Ngo M.D.   On: 12/29/2021 22:15   DG Chest 2 View  Result Date: 12/29/2021 CLINICAL DATA:  Found down, right shoulder and back pain, dyspnea EXAM: CHEST - 2  VIEW COMPARISON:  08/17/2017 FINDINGS: Frontal and lateral views of the chest demonstrate an unremarkable cardiac silhouette. No acute airspace disease, effusion, or pneumothorax. Likely anterior glenohumeral dislocation right shoulder. No acute displaced fracture. IMPRESSION: 1. Anterior dislocation of the right glenohumeral joint. 2. No acute intrathoracic process. Electronically Signed   By: Randa Ngo M.D.   On: 12/29/2021 22:14    Pending Labs Unresulted Labs (From admission, onward)     Start     Ordered   12/31/21 0500  Comprehensive metabolic panel  Daily at 5am,   R      12/30/21 0829   12/31/21 0500  CBC  Daily at 5am,   R      12/30/21 0829   12/31/21 0500  Magnesium  Daily at 5am,   R      12/30/21 0829   12/31/21 0500  CK  Tomorrow morning,   R        12/30/21 0829   12/30/21 1754  Rapid urine drug screen (hospital performed)  Add-on,   AD        12/30/21 1753            Vitals/Pain Today's Vitals   12/30/21 1600 12/30/21 1715 12/30/21 1855 12/30/21 1941  BP:  105/66  (!) 117/59  Pulse: 69 73  80  Resp: 20 20  (!) 22  Temp:   98.5 F (36.9 C) 97.9 F (36.6 C)  TempSrc:   Oral Oral  SpO2: 91% 93%  95%  Weight:      Height:      PainSc:        Isolation Precautions No active isolations  Medications Medications  acetaminophen (TYLENOL) tablet 650 mg (has no administration in time range)    Or  acetaminophen (TYLENOL) suppository 650 mg (has no administration in time range)  enoxaparin (LOVENOX) injection 40 mg (40 mg Subcutaneous Given 12/30/21 0940)  naloxone (NARCAN) injection 0.4 mg (has no administration in time range)  fentaNYL (SUBLIMAZE) injection 50 mcg (50 mcg Intravenous Given 12/30/21 1526)  ondansetron (ZOFRAN) injection 4 mg (has no administration in time range)  aspirin EC tablet 81  mg (81 mg Oral Given 12/30/21 0938)  mometasone-formoterol (DULERA) 100-5 MCG/ACT inhaler 2 puff (2 puffs Inhalation Given 12/30/21 0850)  busPIRone (BUSPAR)  tablet 15 mg (15 mg Oral Given 12/30/21 1525)  divalproex (DEPAKOTE) DR tablet 500 mg (500 mg Oral Given 12/30/21 0938)  metoprolol succinate (TOPROL-XL) 24 hr tablet 12.5 mg (12.5 mg Oral Given 12/30/21 0938)  OXcarbazepine (TRILEPTAL) tablet 150 mg (150 mg Oral Given 12/30/21 1159)  QUEtiapine (SEROQUEL XR) 24 hr tablet 400 mg (has no administration in time range)  sertraline (ZOLOFT) tablet 200 mg (200 mg Oral Given 12/30/21 0939)  tamsulosin (FLOMAX) capsule 0.8 mg (0.8 mg Oral Given 12/30/21 0938)  ipratropium-albuterol (DUONEB) 0.5-2.5 (3) MG/3ML nebulizer solution 3 mL (has no administration in time range)  hydrALAZINE (APRESOLINE) injection 10 mg (has no administration in time range)  metoprolol tartrate (LOPRESSOR) injection 5 mg (has no administration in time range)  0.9 %  sodium chloride infusion ( Intravenous Infusion Verify 12/30/21 1800)  oxyCODONE (Oxy IR/ROXICODONE) immediate release tablet 5 mg (has no administration in time range)  senna-docusate (Senokot-S) tablet 1 tablet (has no administration in time range)  guaiFENesin (ROBITUSSIN) 100 MG/5ML liquid 5 mL (has no administration in time range)  traZODone (DESYREL) tablet 50 mg (has no administration in time range)  LORazepam (ATIVAN) injection 1 mg (1 mg Intravenous Given 12/30/21 1549)  valproate (DEPACON) 750 mg in dextrose 5 % 50 mL IVPB (750 mg Intravenous New Bag/Given 12/30/21 1856)  Tdap (BOOSTRIX) injection 0.5 mL (0.5 mLs Intramuscular Given 12/29/21 2258)  morphine (PF) 4 MG/ML injection 4 mg (4 mg Intravenous Given 12/29/21 2258)  ondansetron (ZOFRAN) injection 4 mg (4 mg Intravenous Given 12/29/21 2300)  lactated ringers bolus 1,000 mL (0 mLs Intravenous Stopped 12/29/21 2353)  iohexol (OMNIPAQUE) 300 MG/ML solution 80 mL (80 mLs Intravenous Contrast Given 12/29/21 2319)  fentaNYL (SUBLIMAZE) injection 100 mcg (100 mcg Intravenous Given 12/29/21 2341)  propofol (DIPRIVAN) 10 mg/mL bolus/IV push 48.8 mg (48.8 mg Intravenous  Given by Other 12/30/21 0010)  ondansetron (ZOFRAN) injection 4 mg (4 mg Intravenous Given 12/30/21 0004)  0.9 %  sodium chloride infusion (0 mLs Intravenous Stopped 12/30/21 0052)  propofol (DIPRIVAN) 10 mg/mL bolus/IV push (20 mg Intravenous Given 12/30/21 0010)  calcium gluconate 2 g/ 100 mL sodium chloride IVPB (0 mg Intravenous Stopped 12/30/21 1447)    Mobility walks with person assist Low fall risk   Focused Assessments Cardiac Assessment Handoff:  Cardiac Rhythm: Normal sinus rhythm Lab Results  Component Value Date   CKTOTAL 1,696 (H) 12/30/2021   CKMB 2.3 02/01/2013   TROPONINI <0.03 08/18/2017     , Neuro Assessment Handoff:   Cardiac Rhythm: Normal sinus rhythm       Neuro Assessment: Within Defined Limits  If patient is a Neuro Trauma and patient is going to OR before floor call report to [facility] nurse: [phone] or [phone]    R Recommendations: See Admitting Provider Note  Report given to:   Additional Notes:

## 2021-12-30 NOTE — Progress Notes (Signed)
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 bilateral L5 foraminal stenosis. 3. Mild bilateral subarticular stenosis at L4-5 related to disc bulge and facet hypertrophy. Aortic Atherosclerosis (ICD10-I70.0).  She Electronically Signed   By: Jeannine Boga M.D.   On: 12/29/2021 23:36   CT T-SPINE NO CHARGE  Result Date: 12/29/2021 CLINICAL DATA:  Initial evaluation for acute trauma. EXAM: CT THORACIC SPINE WITHOUT CONTRAST TECHNIQUE: Multidetector CT images of the thoracic were obtained using the standard protocol without intravenous contrast. RADIATION DOSE REDUCTION: This exam was performed according to the departmental dose-optimization program which includes automated exposure control, adjustment of the mA and/or kV according to patient size and/or use of iterative reconstruction technique. COMPARISON:  None Available. FINDINGS: Alignment: Physiologic with preservation of the normal thoracic kyphosis. No listhesis. Vertebrae: Vertebral body height maintained without acute or chronic fracture. Visualized ribs intact. No discrete or worrisome osseous lesions. Paraspinal and other soft tissues: Paraspinous soft  tissues demonstrate no acute finding. Scattered aortic atherosclerosis. Mild upper lobe predominant centrilobular emphysema. Cholelithiasis noted. Disc levels: Few scattered degenerative endplate Schmorl's node deformities with endplate spurring noted within the mid and lower thoracic spine. No significant spinal stenosis by CT. IMPRESSION: 1. No acute traumatic injury within the thoracic spine. 2. Mild upper lobe predominant centrilobular emphysema. 3. Cholelithiasis. Aortic Atherosclerosis (ICD10-I70.0) and Emphysema (ICD10-J43.9). Electronically Signed   By: Jeannine Boga M.D.   On: 12/29/2021 23:28   DG Shoulder Right  Result Date: 12/29/2021 CLINICAL DATA:  Pain after fall EXAM: RIGHT SHOULDER - 2+ VIEW COMPARISON:  None Available. FINDINGS: The shoulder is anteriorly dislocated. Mild irregularity is identified along the posterior aspect of the glenoid. AC joint degenerative changes are identified. No fractures. No other acute abnormalities. IMPRESSION: Anterior shoulder dislocation. Irregularity along the posterior lip of the glenoid is age indeterminate but could be secondary to dislocation. Electronically Signed   By: Dorise Bullion III M.D.   On: 12/29/2021 22:15   DG Pelvis 1-2 Views  Result Date: 12/29/2021 CLINICAL DATA:  Found down EXAM: PELVIS - 1-2 VIEW COMPARISON:  07/01/2013 FINDINGS: Single frontal view of the pelvis demonstrates no acute displaced fractures. Mild symmetrical bilateral hip osteoarthritis. Alignment is anatomic. Prominent spondylosis and facet hypertrophy at the lumbosacral junction. IMPRESSION: 1. Degenerative changes of the lumbar spine and bilateral hips. 2. No acute fracture. Electronically Signed   By: Randa Ngo M.D.   On: 12/29/2021 22:15   DG Chest 2 View  Result Date: 12/29/2021 CLINICAL DATA:  Found down, right shoulder and back pain, dyspnea EXAM: CHEST - 2 VIEW COMPARISON:  08/17/2017 FINDINGS: Frontal and lateral views of the chest demonstrate an  unremarkable cardiac silhouette. No acute airspace disease, effusion, or pneumothorax. Likely anterior glenohumeral dislocation right shoulder. No acute displaced fracture. IMPRESSION: 1. Anterior dislocation of the right glenohumeral joint. 2. No acute intrathoracic process. Electronically Signed   By: Randa Ngo M.D.   On: 12/29/2021 22:14        Scheduled Meds:  aspirin EC  81 mg Oral Daily   busPIRone  15 mg Oral TID   divalproex  500 mg Oral BID   enoxaparin (LOVENOX) injection  40 mg Subcutaneous Q24H   metoprolol succinate  12.5 mg Oral Daily   mometasone-formoterol  2 puff Inhalation BID   OXcarbazepine  150 mg Oral BID   QUEtiapine  400 mg Oral QHS   sertraline  200 mg Oral Daily   tamsulosin  0.8 mg Oral Daily  Continuous Infusions:  lactated ringers 100 mL/hr at 12/30/21 0052     LOS: 0 days   Time spent= 35 mins    Mattye Verdone Arsenio Loader, MD [facility]  If 7PM-7AM, please contact night-coverage  12/30/2021, 8:20 AM

## 2021-12-30 NOTE — ED Notes (Signed)
RN clean up pt. New sheet and chucks applied.

## 2021-12-30 NOTE — Sedation Documentation (Signed)
Dr. Ralene Bathe remains at [facility]. Shoulder remains reduced. Sling/ immobilizer in place. Arousable to voice and interactive. Speech clear. Requesting ice water. NAD, calm. VSS.

## 2021-12-30 NOTE — H&P (Signed)
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 Cirrhosis. 7. Cholelithiasis. Electronically Signed   By: Fidela Salisbury M.D.   On: 12/29/2021 23:41   CT Cervical Spine Wo Contrast  Result Date: 12/29/2021 CLINICAL DATA:  Head trauma, moderate-severe; Polytrauma, blunt. Fall, found down, right shoulder and back pain EXAM: CT HEAD WITHOUT CONTRAST CT CERVICAL SPINE WITHOUT CONTRAST CT CHEST, ABDOMEN AND PELVIS WITH CONTRAST TECHNIQUE: Contiguous axial images were obtained from the base of the skull through the vertex without intravenous contrast. Multidetector CT imaging of the cervical spine was performed without intravenous contrast. Multiplanar CT image reconstructions were also generated. Multidetector CT imaging of the chest, abdomen and pelvis was performed following the standard protocol during bolus administration of intravenous contrast. RADIATION DOSE REDUCTION: This exam was performed according to the departmental dose-optimization program which includes automated exposure control, adjustment  of the mA and/or kV according to patient size and/or use of iterative reconstruction technique. CONTRAST:  62m OMNIPAQUE IOHEXOL 300 MG/ML  SOLN COMPARISON:  CT head 11/25/2021, CT cervical spine 07/28/2014, CTA chest abdomen pelvis 03/03/2016 FINDINGS: CT HEAD FINDINGS Brain: Normal anatomic configuration. Parenchymal volume loss is commensurate with the patient's age. Mild periventricular white matter changes are present likely reflecting the sequela of small vessel ischemia. No abnormal intra or extra-axial mass lesion or fluid collection. No abnormal mass effect or midline shift. No evidence of acute intracranial hemorrhage or infarct. Ventricular size is normal. Cerebellum unremarkable. Vascular: No asymmetric hyperdense vasculature at the skull base. Skull: Intact Sinuses/Orbits: Paranasal sinuses are clear. Stable remote right medial orbital  wall fracture. Orbits are otherwise unremarkable. Other: Mastoid air cells and middle ear cavities are clear. Remote fracture of the left zygomatic arch noted, unchanged. CT CERVICAL FINDINGS Alignment: Normal.  No listhesis. Skull base and vertebrae: Craniocervical alignment is normal. The atlantodental interval is not widened. No acute fracture of the cervical spine. Vertebral body height is preserved. Soft tissues and spinal canal: No prevertebral fluid or swelling. No visible canal hematoma. Moderate atherosclerotic calcification within the carotid bifurcations bilaterally. No cervical adenopathy. Infiltration within subcutaneous fat of the right neck base is noted in keeping with probable edema in this acutely traumatized patient. Disc levels: There is intervertebral disc space narrowing and endplate remodeling at N0-5 in keeping with changes of advanced degenerative disc disease. Mild degenerative changes are seen at C3-C6. Prevertebral soft tissues are not thickened on sagittal reformats. Spinal canal is widely patent. Uncovertebral arthrosis results in mild right  neuroforaminal narrowing at C6-7. Remaining neural foramina are widely patent. Other:  None CT CHEST FINDINGS Cardiovascular: Moderate multi-vessel coronary artery calcification. Global cardiac size within normal limits. No pericardial effusion. Central pulmonary arteries are of normal caliber. Mild atherosclerotic calcification within the thoracic aorta. No aortic aneurysm. Mediastinum/Nodes: Thyroid is unremarkable. No pathologic thoracic adenopathy. Esophagus is unremarkable. No mediastinal hematoma. No pneumomediastinum. Lungs/Pleura: Lungs are clear. No pneumothorax or pleural effusion. Central airways are widely patent. Musculoskeletal: There is anteroinferior right glenohumeral dislocation. Hill-Sachs deformity noted. No definite superimposed fracture identified. Probable degenerative calcification of the inferior glenoid labrum. Osseous structures are otherwise age-appropriate. CT ABDOMEN PELVIS FINDINGS Hepatobiliary: Cholelithiasis without pericholecystic inflammatory change. Liver contour is diffusely nodular in keeping with changes of cirrhosis. No enhancing intrahepatic mass identified. No intra or extrahepatic biliary ductal dilation. Pancreas: Unremarkable Spleen: Unremarkable Adrenals/Urinary Tract: Adrenal glands are unremarkable. Kidneys are normal, without renal calculi, focal lesion, or hydronephrosis. Bladder is unremarkable. Stomach/Bowel: Stomach is within normal limits. Appendix appears normal. No evidence of bowel wall thickening, distention, or inflammatory changes. No free intraperitoneal gas or fluid. Vascular/Lymphatic: Aortic atherosclerosis. No enlarged abdominal or pelvic lymph nodes. Reproductive: Mild prostatic hypertrophy. Other: No abdominal wall hernia. Musculoskeletal: Degenerative changes are seen at the lumbosacral junction. No acute bone abnormality within the abdomen and pelvis. IMPRESSION: 1. No acute intracranial injury. No calvarial fracture. 2. No acute fracture or  listhesis of the cervical spine. 3. Anteroinferior right glenohumeral dislocation with Hill-Sachs deformity. No definite superimposed acute fracture identified. 4. No acute intrathoracic or intra-abdominal injury. 5. Moderate coronary artery calcification. 6. Cirrhosis. 7. Cholelithiasis. Electronically Signed   By: Fidela Salisbury M.D.   On: 12/29/2021 23:41   CT L-SPINE NO CHARGE  Result Date: 12/29/2021 CLINICAL DATA:  Initial evaluation for acute trauma. EXAM: CT LUMBAR SPINE WITHOUT CONTRAST TECHNIQUE: Multidetector CT imaging of the lumbar spine was performed without intravenous contrast administration. Multiplanar CT image reconstructions were also generated. RADIATION DOSE REDUCTION: This exam was performed according to the departmental dose-optimization program which includes automated exposure control, adjustment of the mA and/or kV according to patient size and/or use of iterative reconstruction technique. COMPARISON:  Prior MRI from 02/16/2016. FINDINGS: Segmentation: Standard. Lowest well-formed disc space labeled the L5-S1 level. Alignment: Trace degenerative retrolisthesis of L5 on S1. Alignment otherwise normal with preservation of the normal lumbar lordosis. Vertebrae: Vertebral body height maintained without acute or chronic fracture. Visualized sacrum and pelvis intact. SI joints symmetric and within normal limits. No discrete or worrisome osseous lesions. Paraspinal and other soft tissues: Paraspinous soft tissues demonstrate no acute finding. Aorto bi-iliac atherosclerotic  disease. Disc levels: L1-2:  Unremarkable. L2-3: Mild endplate spurring without significant disc bulge. Mild facet spurring. No stenosis. L3-4: Mild disc bulge with endplate spurring. Mild facet hypertrophy. No significant spinal stenosis. Foramina remain patent. L4-5: Mild disc bulge with reactive endplate spurring. Mild facet hypertrophy. Mild bilateral subarticular stenosis. Foramina remain patent. L5-S1: Degenerative  intervertebral disc space narrowing with diffuse disc bulge and reactive endplate spurring. Mild to moderate facet hypertrophy. No spinal stenosis. Moderate bilateral L5 foraminal stenosis. IMPRESSION: 1. No acute traumatic injury within the lumbar spine. 2. Degenerative spondylosis at L5-S1 with resultant moderate bilateral L5 foraminal stenosis. 3. Mild bilateral subarticular stenosis at L4-5 related to disc bulge and facet hypertrophy. Aortic Atherosclerosis (ICD10-I70.0).  She Electronically Signed   By: Jeannine Boga M.D.   On: 12/29/2021 23:36   CT T-SPINE NO CHARGE  Result Date: 12/29/2021 CLINICAL DATA:  Initial evaluation for acute trauma. EXAM: CT THORACIC SPINE WITHOUT CONTRAST TECHNIQUE: Multidetector CT images of the thoracic were obtained using the standard protocol without intravenous contrast. RADIATION DOSE REDUCTION: This exam was performed according to the departmental dose-optimization program which includes automated exposure control, adjustment of the mA and/or kV according to patient size and/or use of iterative reconstruction technique. COMPARISON:  None Available. FINDINGS: Alignment: Physiologic with preservation of the normal thoracic kyphosis. No listhesis. Vertebrae: Vertebral body height maintained without acute or chronic fracture. Visualized ribs intact. No discrete or worrisome osseous lesions. Paraspinal and other soft tissues: Paraspinous soft tissues demonstrate no acute finding. Scattered aortic atherosclerosis. Mild upper lobe predominant centrilobular emphysema. Cholelithiasis noted. Disc levels: Few scattered degenerative endplate Schmorl's node deformities with endplate spurring noted within the mid and lower thoracic spine. No significant spinal stenosis by CT. IMPRESSION: 1. No acute traumatic injury within the thoracic spine. 2. Mild upper lobe predominant centrilobular emphysema. 3. Cholelithiasis. Aortic Atherosclerosis (ICD10-I70.0) and Emphysema  (ICD10-J43.9). Electronically Signed   By: Jeannine Boga M.D.   On: 12/29/2021 23:28   DG Shoulder Right  Result Date: 12/29/2021 CLINICAL DATA:  Pain after fall EXAM: RIGHT SHOULDER - 2+ VIEW COMPARISON:  None Available. FINDINGS: The shoulder is anteriorly dislocated. Mild irregularity is identified along the posterior aspect of the glenoid. AC joint degenerative changes are identified. No fractures. No other acute abnormalities. IMPRESSION: Anterior shoulder dislocation. Irregularity along the posterior lip of the glenoid is age indeterminate but could be secondary to dislocation. Electronically Signed   By: Dorise Bullion III M.D.   On: 12/29/2021 22:15   DG Pelvis 1-2 Views  Result Date: 12/29/2021 CLINICAL DATA:  Found down EXAM: PELVIS - 1-2 VIEW COMPARISON:  07/01/2013 FINDINGS: Single frontal view of the pelvis demonstrates no acute displaced fractures. Mild symmetrical bilateral hip osteoarthritis. Alignment is anatomic. Prominent spondylosis and facet hypertrophy at the lumbosacral junction. IMPRESSION: 1. Degenerative changes of the lumbar spine and bilateral hips. 2. No acute fracture. Electronically Signed   By: Randa Ngo M.D.   On: 12/29/2021 22:15   DG Chest 2 View  Result Date: 12/29/2021 CLINICAL DATA:  Found down, right shoulder and back pain, dyspnea EXAM: CHEST - 2 VIEW COMPARISON:  08/17/2017 FINDINGS: Frontal and lateral views of the chest demonstrate an unremarkable cardiac silhouette. No acute airspace disease, effusion, or pneumothorax. Likely anterior glenohumeral dislocation right shoulder. No acute displaced fracture. IMPRESSION: 1. Anterior dislocation of the right glenohumeral joint. 2. No acute intrathoracic process. Electronically Signed   By: Randa Ngo M.D.   On: 12/29/2021 22:14     EKG: Independently reviewed,  with result as described above.    Assessment/Plan   Principal Problem:   AKI (acute kidney injury) ([place]) Active Problems:    Hyperlipidemia   COPD (chronic obstructive pulmonary disease) ([facility])   BPH (benign prostatic hyperplasia)   Rhabdomyolysis   Dehydration   Anterior dislocation of right shoulder   Lactic acidosis   Trenchfoot   Leukocytosis   History of seizures   Chronic diastolic CHF (congestive heart failure) ([facility])   GAD (generalized anxiety disorder)       #) Acute Kidney Injury: Presenting serum creatinine 1.44 compared most recent prior value of 0.66 on 12/26/2021.  Appears prerenal in nature, with corresponding evidence of acute prerenal azotemia, as a consequence of dehydration resulting in very little oral intake over the last 3 days while prolonged period of time and very warm ambient temperatures, with potential for early implications of rhabdomyolysis.  Urinalysis with microscopy notable for 100 protein as well as the presence hyaline cast, consistent with a picture of dehydration.  will pursue continuous IV fluids,, will closely monitoring for ensuing evidence of volume overload as a consequence of concomitant history of chronic diastolic heart failure.  Consequently, we will pursue a lower rate of IV fluids relative to a similar presentation in a patient without with this cardiac history.  Of note, CT abdomen/pelvis showed no evidence of postrenal obstructive process.   Plan: monitor strict I's & O's and daily weights. Attempt to avoid nephrotoxic agents. Refrain from NSAIDs. Repeat CMP in the morning. Check serum magnesium level.  Lactated Ringer's at 100 cc/h.  Repeat CPK level in the morning.          #) Early rhabdomyolysis: Presentation potentially representative of early rhabdomyolysis, with mildly elevated CPK level after prolonged downtime and it did show over the last 3 days, during which the patient was exposed to high ambient temperatures, while complaining of generalized myalgias, and with additional work-up notable for tumor appearing urinalysis, albeit, without overt evidence  of myoglobinuria.  No evidence of objective weakness on exam.  Outpatient medications notable for atorvastatin, which will be held for now, will pending further trend of CPK level.  Plan: IV fluids, as above.  Pete CPK level in the morning.  Further evaluation and management of AKI, as above.  Repeat CMP in the morning.  Holding home statin for now.  Monitor strict I's and O's and O's.         #) Dehydration: Clinical suspicion for such, including the appearance of dry oral mucous membranes as well as laboratory findings notable for acute prerenal azotemia and UA demonstrating elevated specific gravity, and presence of hyaline casts.  Appears to be in the setting of   diminished oral intake of water over the last 72 hours concomitant with insensible losses in high ambient temperatures, as above.  No e/o associated hypotension, but associated AKI, as above   Plan: Monitor strict I's and O's.  Daily weights.  Repeat BMP in the morning. IVF's, as above.         #) Leukocytosis: Presenting CBC reflects mildly elevated white cell count of 19,500. Suspect an element of hemoconcentration in the setting of dehydration. This may also be reactive in nature in the setting of recent fall into the ditch, as above.. No evidence to suggest underlying infectious process at this time, including on radiographic evaluation that included CT chest, abdomen, pelvis.  Additionally, urinalysis not consistent with UTI.  In the absence of suspected underlying infection, criteria for sepsis not  currently met.  In this context, and given appearance of hemodynamic stability, will refrain from initiation of antibiotics at this time.  Plan: Repeat CBC with diff in the morning.  Monitor strict I's and O's, daily weights.  IV fluids, as above.         #) Lactic acidosis: Mildly elevated initial lactate of 2.0, with repeat value trending down to 1.6 following interval IV fluids.  Appears multifactorial in etiology,  with suspected contributions from dehydration, acute kidney injury, potential early rhabdomyolysis, as well as suspicion for starvation keto lactic acidosis given ketones identified on urinalysis in the setting of patient's reported very little food consumed over the preceding 72 hours.  Of note, VBG demonstrates no evidence of corresponding acidemia.   Plan: Further evaluation management of AKI and elevated CPK level, as above, including IV fluids.  Repeat CPK level in the morning.  Repeat CMP and CBC in the morning.       #) Trenchfoot: Hyper saturated bilateral feet in the setting of prolonged submersion in a standing body of water at the bottom of the digit which the patient remained over the preceding 72 hours.  Without evidence of corresponding cellulitis.  Plan: Emphasize the bilateral feet be kept warm and dry.          #) anterior dislocation of right shoulder: Is a consequence of mechanical fall and attention, as further detailed above, status post manual reduction under propofol sedation in the ED this evening.  Subsequently appears neurovascular intact, with sling in place following confirmatory post manual reduction and plain films.  Plan: Prn IV fentanyl.         #) COPD: Documented history of such, in the setting of being a former smoker.  Outpatient respiratory regimen includes scheduled Symbicort as well as as needed albuterol inhaler.  No clinical evidence to suggest acute exacerbation thereof.  Plan: Continue outpatient respiratory regimen.          #) Chronic diastolic heart failure: documented history of such, with most recent echocardiogram performed on 12/26/21, which was notable for grade 1 diastolic dysfunction, as further detailed above. No clinical evidence to suggest acutely decompensated heart failure at this time. home diuretic regimen reportedly consists of the following: None.  Close monitoring for ensuing evidence of volume overload given  plan for continuous IV fluids in the setting of dehydration, AKI, and potential early rhabdomyolysis, as above.   Plan: monitor strict I's & O's and daily weights. Repeat BMP in AM. Check serum mag level.          #) Seizure disorder: Patient antiepileptic medications include Depakote as well as oxcarbazepine.  No evidence of active seizure-like activity at this time.   Continue outpatient antiepileptic medications, as above.        #) Generalized anxiety sorter: Documented history of such, on Zoloft as well as scheduled BuSpar as an outpatient.  Plan: Continue outpatient SS RI as well as BuSpar, as above.          #) Hyperlipidemia: documented h/o such. On atorvastatin as outpatient.    Plan: Hold home statin for now in the setting of presenting elevated CPK level.           #) Benign Prostatic Hyperplasia:  documented h/o such; on tamsulosin as outpatient.   Plan: monitor strict I's & O's and daily weights. Repeat BMP in AM.  continue home tamsulosin.         DVT prophylaxis: Lovenox 40 mg SQ daily Code  Status: Full code Family Communication: none Disposition Plan: Per Rounding Team Consults called: none;  Admission status: Inpatient    PLEASE NOTE THAT DRAGON DICTATION SOFTWARE WAS USED IN THE CONSTRUCTION OF THIS NOTE.   [place] DO [facility]  From [place]   12/30/2021, 4:27 AM

## 2021-12-30 NOTE — Discharge Summary (Signed)
Physician Discharge Summary   Patient: [name] MRN: [id number] DOB: August 19, 1955  Admit date:     12/25/2021  Discharge date: 12/27/2021  Discharge Physician: Berle Mull  PCP: Lorrene Reid, PA-C  Recommendations at discharge: Follow-up with PCP in 1 week. Follow-up with neurology as recommended. No driving for 6 months.  Discharge Diagnoses: Principal Problem:   Stroke-like episode Active Problems:   History of drug abuse (HCC)   Liver cirrhosis (HCC)   Bipolar affective disorder in remission ([place])   Seizures ([place])   Hypertension   Hyperlipidemia   Psychophysiological insomnia   COPD (chronic obstructive pulmonary disease) (HCC)   BPH (benign prostatic hyperplasia)   Obesity (BMI 30-39.9)   Thrombocytopenia (HCC)   Mood disorder [facility])  Hospital Course: Past medical history of HTN, HLD, COPD, bipolar disorder, seizure disorder cannabis use.  Now present to the hospital with complaints of aphasia.  Stroke and TIA ruled out. At the time of my evaluation patient reports anxiety but no other acute complaints. Neurology consulted.  Depakote level elevated.  Possible toxicity cannot be ruled out.  Assessment and Plan: Aphasia. Etiology not clear. MRI brain and CT scan of the head unremarkable for any acute abnormality. Does not appear to have any stroke or TIA-like picture. Neurology evaluating the patient.  No further stroke work-up. Currently symptoms completely resolved.  Outpatient follow-up with neurology recommended.  Acute toxic encephalopathy secondary to Depakote. Patient's Depakote levels are more than 120.  Ammonia level also elevated. Depakote dose currently reduced. Patient started on lactulose. Ammonia level trending down. Recommend repeat Depakote level in 1 to 2 weeks. Continue lactulose on discharge. Follow-up with neurology as recommended.  Next  Mood disorder. Anxiety. Insomnia. Continue patient's home regimen.  Substance  abuse. Possibility of cannabis induced encephalopathy cannot ruled out as well. Patient is counseled to quit abusing substances.  Seizure disorder. LTM EEG shows no ongoing seizures ongoing. Appreciate neurological input.  HTN. Continuing current regimen.  HLD. Continuing statin.  Liver cirrhosis. Thrombocytopenia. Monitor for now. No evidence of ascites. Platelet count stable and mostly low secondary to liver cirrhosis and alcohol but Depakote induced thrombocytopenia cannot be ruled out as well. Currently avoiding chemical prophylaxis for DVT due to increased risk.  Anxiety. Continue home regimen.  Class I obesity.  No morbid obesity. Placing the patient at higher risk of poor outcome. Body mass index is 31.78 kg/m.   Consultants: Neurology Procedures performed:  Long-term EEG monitoring DISCHARGE MEDICATION: Allergies as of 12/27/2021   No Known Allergies      Medication List     STOP taking these medications    lisinopril 5 MG tablet Commonly known as: ZESTRIL       TAKE these medications    albuterol (2.5 MG/3ML) 0.083% nebulizer solution Commonly known as: PROVENTIL Take 3 mLs (2.5 mg total) by nebulization every 6 (six) hours as needed for wheezing or shortness of breath.   albuterol 108 (90 Base) MCG/ACT inhaler Commonly known as: VENTOLIN HFA INHALE 2 PUFFS INTO THE LUNGS EVERY 6 HOURS AS NEEDED FOR WHEEZING OR SHORTNESS OF BREATH.   aspirin EC 81 MG tablet Take 1 tablet (81 mg total) by mouth daily. Swallow whole.   atorvastatin 20 MG tablet Commonly known as: LIPITOR TAKE 1 TABLET (20 MG TOTAL) BY MOUTH AT BEDTIME.   Belsomra 15 MG Tabs Generic drug: Suvorexant Take 1 tablet by mouth at bedtime as needed. What changed: reasons to take this   budesonide-formoterol 80-4.5 MCG/ACT inhaler Commonly known  as: SYMBICORT Inhale 2 puffs into the lungs 2 (two) times daily.   busPIRone 15 MG tablet Commonly known as: BUSPAR Take 15 mg by  mouth 3 (three) times daily.   divalproex 500 MG DR tablet Commonly known as: DEPAKOTE Take 1 tablet (500 mg total) by mouth 2 (two) times daily. What changed:  how much to take how to take this when to take this additional instructions   folic acid 1 MG tablet Commonly known as: FOLVITE Take 1 tablet (1 mg total) by mouth daily.   hydrOXYzine 10 MG tablet Commonly known as: ATARAX TAKE 1 TABLET BY MOUTH 3 TIMES DAILY AS NEEDED. What changed: reasons to take this   lactulose 10 GM/15ML solution Commonly known as: CHRONULAC Take 30 mLs (20 g total) by mouth 2 (two) times daily. Hold after 2 loose BM every day   metoprolol succinate 25 MG 24 hr tablet Commonly known as: TOPROL-XL TAKE 1/2 TABLET BY MOUTH DAILY.   Nitrostat 0.4 MG SL tablet Generic drug: nitroGLYCERIN PLACE 1 TABLET UNDER THE TONGUE EVERY 5 MINUTES AS NEEDED FOR CHEST PAIN FOR UP TO 3 DOSES.   OXcarbazepine 150 MG tablet Commonly known as: TRILEPTAL Take 150 mg by mouth 2 (two) times daily.   QUEtiapine 400 MG 24 hr tablet Commonly known as: SEROQUEL XR Take 400 mg by mouth at bedtime.   sertraline 100 MG tablet Commonly known as: ZOLOFT Take 200 mg by mouth daily.   tamsulosin 0.4 MG Caps capsule Commonly known as: FLOMAX TAKE 2 CAPSULES BY MOUTH DAILY.   thiamine 100 MG tablet Take 1 tablet (100 mg total) by mouth daily.   traZODone 100 MG tablet Commonly known as: DESYREL Take 100 mg by mouth at bedtime as needed for sleep.        Follow-up Information     Lorrene Reid, PA-C. Schedule an appointment as soon as possible for a visit in 1 week(s).   Specialty: Physician Assistant Contact information: [place] [address] [phone]         [facility]. Schedule an appointment as soon as possible for a visit in 1 month(s).   Specialty: Neurology Contact information: [address] [place] [phone]               Disposition: Home Diet recommendation: Cardiac diet  Discharge Exam: Vitals:   12/27/21 0015 12/27/21 0100 12/27/21 0300 12/27/21 0824  BP: 113/67 113/67 119/66 108/74  Pulse: (!) 58 66 (!) 58 62  Resp:  16 16   Temp:  98.1 F (36.7 C) 98 F (36.7 C)   TempSrc:  Oral Oral   SpO2: 94% 96% 94%   Weight:      Height:       General: Appear in no distress; no visible Abnormal Neck Mass Or lumps, Conjunctiva normal Cardiovascular: S1 and S2 Present, no Murmur, Respiratory: good respiratory effort, Bilateral Air entry present and CTA, no Crackles, no wheezes Abdomen: Bowel Sound present, Non tender  Extremities: no Pedal edema Neurology: alert and oriented to time, place, and person  Gait not checked due to patient safety concerns Filed Weights   12/25/21 1442  Weight: 94.8 kg   Condition at discharge: stable  The results of significant diagnostics from this hospitalization (including imaging, microbiology, ancillary and laboratory) are listed below for reference.   Imaging Studies:  ECHOCARDIOGRAM COMPLETE  Result Date: 12/26/2021    ECHOCARDIOGRAM REPORT  Patient Name:   BINYOMIN BRANN Date of Exam: 12/26/2021 Medical Rec #:  [id number]       Height:       68.0 in Accession #:    [id number]      Weight:       209.0 lb Date of Birth:  07-17-1955      BSA:          2.082 m Patient Age:    89 years        BP:           107/70 mmHg Patient Gender: M               HR:           64 bpm. Exam Location:  Inpatient Procedure: 2D Echo, Color Doppler and Cardiac Doppler Indications:    Stroke I63.9  History:        Patient has no prior history of Echocardiogram examinations and                 Patient has prior history of Echocardiogram examinations, most                 recent 07/02/2013. CAD and Previous Myocardial Infarction,                 Stroke, Arrythmias:Bradycardia; Risk Factors:Hypertension and                 Dyslipidemia.  Sonographer:     Ronny Flurry Sonographer#2:  Danne Baxter RDCS, FE, PE Referring Phys: [id number] [place]  1. Left ventricular ejection fraction, by estimation, is 55 to 60%. The left ventricle has normal function. The left ventricle demonstrates regional wall motion abnormalities with basal inferior akinesis. There is mild concentric left ventricular hypertrophy. Left ventricular diastolic parameters are consistent with Grade I diastolic dysfunction (impaired relaxation).  2. Right ventricular systolic function is normal. The right ventricular size is normal. Tricuspid regurgitation signal is inadequate for assessing PA pressure.  3. The aortic valve was not well visualized. Aortic valve regurgitation is not visualized. No aortic stenosis is present.  4. The mitral valve is normal in structure. No evidence of mitral valve regurgitation. No evidence of mitral stenosis.  5. The inferior vena cava is normal in size with greater than 50% respiratory variability, suggesting right atrial pressure of 3 mmHg. FINDINGS  Left Ventricle: Left ventricular ejection fraction, by estimation, is 55 to 60%. The left ventricle has normal function. The left ventricle demonstrates regional wall motion abnormalities. The left ventricular internal cavity size was normal in size. There is mild concentric left ventricular hypertrophy. Left ventricular diastolic parameters are consistent with Grade I diastolic dysfunction (impaired relaxation). Right Ventricle: The right ventricular size is normal. No increase in right ventricular wall thickness. Right ventricular systolic function is normal. Tricuspid regurgitation signal is inadequate for assessing PA pressure. Left Atrium: Left atrial size was normal in size. Right Atrium: Right atrial size was normal in size. Pericardium: There is no evidence of pericardial effusion. Mitral Valve: The mitral valve is normal in structure. No evidence of mitral valve regurgitation. No evidence of  mitral valve stenosis. Tricuspid Valve: The tricuspid valve is normal in structure. Tricuspid valve regurgitation is not demonstrated. Aortic Valve: The aortic valve was not well visualized. Aortic valve regurgitation is not visualized. No aortic stenosis is present. Pulmonic Valve: The pulmonic valve was not well visualized. Pulmonic valve regurgitation is not visualized. Aorta: The aortic root is normal  in size and structure. Venous: The inferior vena cava is normal in size with greater than 50% respiratory variability, suggesting right atrial pressure of 3 mmHg. IAS/Shunts: No atrial level shunt detected by color flow Doppler.  LEFT VENTRICLE PLAX 2D LVIDd:         4.85 cm   Diastology LVIDs:         4.10 cm   LV e' medial:    6.24 cm/s LV PW:         0.70 cm   LV E/e' medial:  10.1 LV IVS:        1.50 cm   LV e' lateral:   12.60 cm/s LVOT diam:     1.90 cm   LV E/e' lateral: 5.0 LV SV:         72 LV SV Index:   35 LVOT Area:     2.84 cm  RIGHT VENTRICLE RV S prime:     13.80 cm/s TAPSE (M-mode): 1.1 cm LEFT ATRIUM             Index        RIGHT ATRIUM           Index LA diam:        3.80 cm 1.82 cm/m   RA Area:     15.40 cm LA Vol (A2C):   36.9 ml 17.72 ml/m  RA Volume:   30.90 ml  14.84 ml/m LA Vol (A4C):   29.4 ml 14.12 ml/m LA Biplane Vol: 32.2 ml 15.46 ml/m  AORTIC VALVE LVOT Vmax:   117.00 cm/s LVOT Vmean:  72.100 cm/s LVOT VTI:    0.255 m  AORTA Ao Root diam: 3.50 cm Ao Asc diam:  3.40 cm MITRAL VALVE MV Area (PHT): 2.70 cm    SHUNTS MV Decel Time: 281 msec    Systemic VTI:  0.26 m MV E velocity: 63.00 cm/s  Systemic Diam: 1.90 cm MV A velocity: 70.30 cm/s MV E/A ratio:  0.90 Dalton McleanMD Electronically signed by Franki Monte Signature Date/Time: 12/26/2021/3:10:58 PM    Final    Overnight EEG with video  Result Date: 12/26/2021 Lora Havens, MD     12/26/2021  2:24 PM Patient Name: ULYSEE FYOCK MRN: [id number] Epilepsy Attending: Lora Havens Referring Physician/Provider: Amie Portland, MD Duration: 12/25/2021 1959 to 12/26/2021 1336  Patient history: : 66 year old with past medical history of substance abuse, seizures with questionable compliance to Depakote presenting for evaluation of sudden inability to talk. EEG to evaluate for seizure  Level of alertness: Awake, asleep  AEDs during EEG study: VPA  Technical aspects: This EEG study was done with scalp electrodes positioned according to the 10-20 International system of electrode placement. Electrical activity was acquired at a sampling rate of '500Hz'$  and reviewed with a high frequency filter of '70Hz'$  and a low frequency filter of '1Hz'$ . EEG data were recorded continuously and digitally stored.  Description: The posterior dominant rhythm consists of 8 Hz activity of moderate voltage (25-35 uV) seen predominantly in posterior head regions, symmetric and reactive to eye opening and eye closing.  Sleep was characterized by sleep spindles (12 to 14 Hz), maximal frontocentral region.  Hyperventilation and photic stimulation were not performed.    IMPRESSION: This study is within normal limits. No seizures or epileptiform discharges were seen throughout the recording.  Lora Havens   EEG adult  Result Date: 12/26/2021 Lora Havens, MD     12/26/2021  8:47 AM Patient Name:  LIAHM GRIVAS MRN: [id number] Epilepsy Attending: Lora Havens Referring Physician/Provider: Regan Lemming, MD Date: 12/25/2021 Duration: 22.53 mins Patient history: : 66 year old with past medical history of substance abuse, seizures with questionable compliance to Depakote presenting for evaluation of sudden inability to talk. EEG to evaluate for seizure Level of alertness: Awake AEDs during EEG study: VPA Technical aspects: This EEG study was done with scalp electrodes positioned according to the 10-20 International system of electrode placement. Electrical activity was acquired at a sampling rate of '500Hz'$  and reviewed with a high frequency filter of '70Hz'$  and a  low frequency filter of '1Hz'$ . EEG data were recorded continuously and digitally stored. Description: The posterior dominant rhythm consists of 8 Hz activity of moderate voltage (25-35 uV) seen predominantly in posterior head regions, symmetric and reactive to eye opening and eye closing.  Hyperventilation and photic stimulation were not performed.   IMPRESSION: This study is within normal limits. No seizures or epileptiform discharges were seen throughout the recording. Lora Havens   MR BRAIN WO CONTRAST  Result Date: 12/25/2021 CLINICAL DATA:  Mental status change, unknown cause EXAM: MRI HEAD WITHOUT CONTRAST TECHNIQUE: Multiplanar, multiecho pulse sequences of the brain and surrounding structures were obtained without intravenous contrast. COMPARISON:  CT head from the same day.  MRI September 01, 2013. FINDINGS: Brain: No acute infarction, hemorrhage, hydrocephalus, extra-axial collection or mass lesion. Cerebral atrophy with prominence of the extra-axial spaces. Vascular: Major arterial flow voids are maintained skull base. Skull and upper cervical spine: Normal marrow signal. Sinuses/Orbits: Mild paranasal sinus mucosal thickening. Remote right medial orbital wall fracture. No acute orbital findings. Other: No mastoid effusions. IMPRESSION: 1. No evidence of acute intracranial abnormality. 2.  Cerebral atrophy (ICD10-G31.9). Electronically Signed   By: Margaretha Sheffield M.D.   On: 12/25/2021 16:30   CT HEAD CODE STROKE WO CONTRAST  Result Date: 12/25/2021 CLINICAL DATA:  Code stroke.  Neuro deficit, acute, stroke suspected EXAM: CT HEAD WITHOUT CONTRAST TECHNIQUE: Contiguous axial images were obtained from the base of the skull through the vertex without intravenous contrast. RADIATION DOSE REDUCTION: This exam was performed according to the departmental dose-optimization program which includes automated exposure control, adjustment of the mA and/or kV according to patient size and/or use of iterative  reconstruction technique. COMPARISON:  12/11/2021 FINDINGS: Brain: No acute intracranial hemorrhage, mass effect, or edema. No new loss of gray-white differentiation. Prominence of the ventricles and sulci reflects stable parenchymal volume loss. No extra-axial collection. Vascular: No hyperdense vessel. There is intracranial atherosclerotic calcification at the skull base Skull: No acute abnormality. Sinuses/Orbits: No acute abnormality. Other: Mastoid air cells are clear. ASPECTS ([place] Stroke Program Early CT Score) - Ganglionic level infarction (caudate, lentiform nuclei, internal capsule, insula, M1-M3 cortex): 7 - Supraganglionic infarction (M4-M6 cortex): 3 Total score (0-10 with 10 being normal): 10 IMPRESSION: There is no acute intracranial hemorrhage or evidence of acute infarction. ASPECT score is 10. These results were communicated to Dr. Rory Percy at 3:14 pm on 12/25/2021 by text page via the [facility] messaging system. Electronically Signed   By: Macy Mis M.D.   On: 12/25/2021 15:15   CT Head Wo Contrast  Result Date: 12/11/2021 CLINICAL DATA:  Trauma EXAM: CT HEAD WITHOUT CONTRAST TECHNIQUE: Contiguous axial images were obtained from the base of the skull through the vertex without intravenous contrast. RADIATION DOSE REDUCTION: This exam was performed according to the departmental dose-optimization program which includes automated exposure control, adjustment of the mA and/or kV according to patient size  and/or use of iterative reconstruction technique. COMPARISON:  03/19/2016 FINDINGS: Brain: No acute intracranial findings are seen. There are no signs of bleeding. Cortical sulci are prominent. Vascular: Unremarkable. Skull: No fracture is seen in the calvarium. Sinuses/Orbits: Unremarkable. Other: None. IMPRESSION: No acute intracranial findings are seen in noncontrast CT brain. Atrophy. Electronically Signed   By: Elmer Picker M.D.   On: 12/11/2021 17:47     Microbiology:   Labs: CBC: Recent Labs  Lab 12/25/21 1504 12/25/21 1512 12/26/21 0300  WBC 4.2  --  4.3  NEUTROABS 2.5  --   --   HGB 14.5 13.6 14.4  HCT 41.9 40.0 42.2  MCV 94.8  --  95.7  PLT 81*  --  73*   Basic Metabolic Panel: Recent Labs  Lab 12/25/21 1504 12/25/21 1512 12/25/21 1922 12/26/21 0300  NA 140 141  --  141  K 3.8 3.7  --  3.5  CL 104 103  --  111  CO2 27  --   --  24  GLUCOSE 112* 105*  --  129*  BUN 14 16  --  13  CREATININE 0.82 0.70  --  0.66  CALCIUM 9.2  --   --  8.9  MG  --   --  2.2  --   PHOS  --   --  3.3  --    Liver Function Tests: Recent Labs  Lab 12/25/21 1504  AST 30  ALT 21  ALKPHOS 47  BILITOT 0.5  PROT 6.2*  ALBUMIN 3.8   CBG: Recent Labs  Lab 12/25/21 1453  GLUCAP 163*    Discharge time spent: greater than 30 minutes.  Signed: Berle Mull, MD [facility] 12/27/2021

## 2021-12-31 ENCOUNTER — Other Ambulatory Visit: Payer: Self-pay | Admitting: Physician Assistant

## 2021-12-31 ENCOUNTER — Telehealth (HOSPITAL_COMMUNITY): Payer: Self-pay | Admitting: Pharmacy Technician

## 2021-12-31 ENCOUNTER — Inpatient Hospital Stay (HOSPITAL_COMMUNITY): Payer: 59

## 2021-12-31 ENCOUNTER — Other Ambulatory Visit (HOSPITAL_COMMUNITY): Payer: Self-pay

## 2021-12-31 DIAGNOSIS — I159 Secondary hypertension, unspecified: Secondary | ICD-10-CM

## 2021-12-31 DIAGNOSIS — R569 Unspecified convulsions: Secondary | ICD-10-CM

## 2021-12-31 DIAGNOSIS — N179 Acute kidney failure, unspecified: Secondary | ICD-10-CM | POA: Diagnosis not present

## 2021-12-31 DIAGNOSIS — Z87898 Personal history of other specified conditions: Secondary | ICD-10-CM | POA: Diagnosis not present

## 2021-12-31 LAB — CBC
HCT: 33.4 % — ABNORMAL LOW (ref 39.0–52.0)
Hemoglobin: 11.7 g/dL — ABNORMAL LOW (ref 13.0–17.0)
MCH: 32.8 pg (ref 26.0–34.0)
MCHC: 35 g/dL (ref 30.0–36.0)
MCV: 93.6 fL (ref 80.0–100.0)
Platelets: 71 10*3/uL — ABNORMAL LOW (ref 150–400)
RBC: 3.57 MIL/uL — ABNORMAL LOW (ref 4.22–5.81)
RDW: 13.4 % (ref 11.5–15.5)
WBC: 5.6 10*3/uL (ref 4.0–10.5)
nRBC: 0 % (ref 0.0–0.2)

## 2021-12-31 LAB — COMPREHENSIVE METABOLIC PANEL
ALT: 38 U/L (ref 0–44)
AST: 61 U/L — ABNORMAL HIGH (ref 15–41)
Albumin: 3.3 g/dL — ABNORMAL LOW (ref 3.5–5.0)
Alkaline Phosphatase: 40 U/L (ref 38–126)
Anion gap: 9 (ref 5–15)
BUN: 13 mg/dL (ref 8–23)
CO2: 23 mmol/L (ref 22–32)
Calcium: 8.2 mg/dL — ABNORMAL LOW (ref 8.9–10.3)
Chloride: 107 mmol/L (ref 98–111)
Creatinine, Ser: 0.71 mg/dL (ref 0.61–1.24)
GFR, Estimated: >60 mL/min (ref >60)
Glucose, Bld: 106 mg/dL — ABNORMAL HIGH (ref 70–99)
Potassium: 3.4 mmol/L — ABNORMAL LOW (ref 3.5–5.1)
Sodium: 139 mmol/L (ref 135–145)
Total Bilirubin: 1.1 mg/dL (ref 0.3–1.2)
Total Protein: 5.2 g/dL — ABNORMAL LOW (ref 6.5–8.1)

## 2021-12-31 LAB — CK: Total CK: 1025 U/L — ABNORMAL HIGH (ref 49–397)

## 2021-12-31 LAB — AMMONIA: Ammonia: 106 umol/L — ABNORMAL HIGH (ref 9–35)

## 2021-12-31 LAB — MAGNESIUM: Magnesium: 1.8 mg/dL (ref 1.7–2.4)

## 2021-12-31 MED ORDER — POTASSIUM CHLORIDE 20 MEQ PO PACK
40.0000 meq | PACK | Freq: Once | ORAL | Status: AC
  Administered 2021-12-31: 40 meq via ORAL
  Filled 2021-12-31: qty 2

## 2021-12-31 MED ORDER — SODIUM CHLORIDE 0.9 % IV SOLN: INTRAVENOUS | Status: AC

## 2021-12-31 MED ORDER — LEVOCARNITINE 1 GM/10ML PO SOLN
4000.0000 mg | Freq: Two times a day (BID) | ORAL | Status: DC
  Administered 2021-12-31 – 2022-01-02 (×5): 4000 mg via ORAL
  Filled 2021-12-31 (×5): qty 40
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
## 2021-12-31 NOTE — Telephone Encounter (Signed)
Pharmacy Patient Advocate Encounter  Insurance verification completed.    The patient is insured through [employer] Part D   The patient is currently admitted and ran test claims for the following: levocarnitine (Carnitor) 1 gm/10 ml solution..  Copays and coinsurance results were relayed to Inpatient clinical team.

## 2021-12-31 NOTE — TOC Benefit Eligibility Note (Signed)
Patient Teacher, English as a foreign language completed.    The patient is currently admitted and upon discharge could be taking levocarnitine (Carnitor) 1 gm/10 ml solution.  The current 30 day co-pay is, $0.00.   The patient is insured through [place], [place] Patient Advocate Specialist [place] Patient Advocate Team Direct Number: [phone]  Fax: [phone]

## 2021-12-31 NOTE — Progress Notes (Signed)
Orthopedic Tech Progress Note Patient Details:  [name] 1956-02-19 [id number]  Ortho Devices Type of Ortho Device: Arm sling Ortho Device/Splint Location: rue Ortho Device/Splint Interventions: Ordered, Application, Adjustment  Replacement sling due to dirty sling. Post Interventions Patient Tolerated: Well Instructions Provided: Care of device, Adjustment of device  [name] 12/31/2021, 6:35 AM

## 2021-12-31 NOTE — Progress Notes (Signed)
PROGRESS NOTE    [name]  CXK:[id number] DOB: 27-Jun-1955 DOA: 12/29/2021 PCP: Lorrene Reid, PA-C   Brief Narrative:  66 year old with a history of COPD, diastolic CHF, seizure disorder comes to the hospital with complaints of right-sided shoulder pain and was found to be in acute kidney injury.  Apparently he had fallen prior to admission causing right-sided shoulder pain, x-ray showed anterior dislocation without evidence of fracture.  CT head, cervical spine, CT chest, abdomen pelvis were unremarkable.  His right shoulder was manually reduced in the ED.  He was recently admitted to the hospital with aphasia concerns for TIA/CVA.  His work-up was unremarkable and eventually discharged on 7/13 in stable condition.  Upon admission he was noted to have acute kidney injury, rhabdomyolysis and had breakthrough seizures therefore neurology team was consulted.  His Depakote was adjusted, Oxy carbamazepine was continued, MRI C-spine was unremarkable.  Assessment & Plan:  Principal Problem:   AKI (acute kidney injury) ([place]) Active Problems:   Hyperlipidemia   COPD (chronic obstructive pulmonary disease) (HCC)   BPH (benign prostatic hyperplasia)   Rhabdomyolysis   Dehydration   Anterior dislocation of right shoulder   Lactic acidosis   Trenchfoot   Leukocytosis   History of seizures   Chronic diastolic CHF (congestive heart failure) (HCC)   GAD (generalized anxiety disorder)   Breakthrough seizures History of seizure disorder - Likely from medication noncompliance.  Depakote increased to 3 times daily, Oxy carbamazepine twice daily.  Recommended to check levels in 3-5 days.  MRI C-spine unremarkable. - EEG normal  Acute kidney injury, resolved - Baseline creatinine 0.6.  Peaked at 1.44.  Now resolved with IV fluids.  Fall causing mild rhabdomyolysis Mild to moderate dehydration - CK peaked at 1792, this morning 1025  Leukocytosis, improving - Likely reactive and dehydration  from fall  Hypocalcemia - We will give IV calcium gluconate  Trench foot - Prolonged standing in the water.  Supportive care.  Anterior shoulder dislocation - Secondary to mechanical fall, reduced in the ED  Chronic diastolic CHF - Recent echocardiogram 7/13 showed grade 1 DD, EF 60%.  Currently appears to be slightly dehydrated requiring IV fluids.  Generalized anxiety disorder - On Zoloft, BuSpar  Liver cirrhosis - No evidence of ascites.  Closely monitor.  Hyperlipidemia - Statin which is currently on hold due to elevated CK levels  BPH - Flomax   DVT prophylaxis: Lovenox Code Status: Full code Family Communication:    Status is: Inpatient Remains inpatient appropriate because: Neurology team following for seizures.  Also needs PT/OT      Subjective: Seen and examined at bedside no further seizure this morning.  Overall feels very weak  Examination:  Constitutional: Not in acute distress Respiratory: Clear to auscultation bilaterally Cardiovascular: Normal sinus rhythm, no rubs Abdomen: Nontender nondistended good bowel sounds Musculoskeletal: No edema noted Skin: No rashes seen Neurologic: CN 2-12 grossly intact.  And nonfocal Psychiatric: Normal judgment and insight. Alert and oriented x 3. Normal mood. Right upper extremity in sling  Objective: Vitals:   12/31/21 0620 12/31/21 0716 12/31/21 0748 12/31/21 0817  BP: 104/73  111/65   Pulse: 98  90   Resp: 18  18   Temp: 98.6 F (37 C)  98.2 F (36.8 C)   TempSrc: Oral  Oral   SpO2: 93%  94% 94%  Weight:  91.5 kg    Height:        Intake/Output Summary (Last 24 hours) at 12/31/2021 9702 Last data  filed at 12/30/2021 2300 Gross per 24 hour  Intake 2169.75 ml  Output 400 ml  Net 1769.75 ml   Filed Weights   12/29/21 2128 12/31/21 0716  Weight: 97.5 kg 91.5 kg     Data Reviewed:   CBC: Recent Labs  Lab 12/25/21 1504 12/25/21 1512 12/26/21 0300 12/29/21 2102 12/29/21 2129  12/30/21 0518 12/31/21 0217  WBC 4.2  --  4.3 19.5*  --  15.2* 5.6  NEUTROABS 2.5  --   --  15.9*  --  11.1*  --   HGB 14.5   < > 14.4 16.2 15.6 13.9 11.7*  HCT 41.9   < > 42.2 45.8 46.0 39.8 33.4*  MCV 94.8  --  95.7 93.1  --  93.9 93.6  PLT 81*  --  73* 125*  --  105* 71*   < > = values in this interval not displayed.   Basic Metabolic Panel: Recent Labs  Lab 12/25/21 1504 12/25/21 1512 12/25/21 1922 12/26/21 0300 12/29/21 2102 12/29/21 2129 12/30/21 0518 12/31/21 0217  NA 140 141  --  141 141 141 138 139  K 3.8 3.7  --  3.5 4.5 4.3 4.0 3.4*  CL 104 103  --  111 103  --  101 107  CO2 27  --   --  24 18*  --  22 23  GLUCOSE 112* 105*  --  129* 139*  --  112* 106*  BUN 14 16  --  13 34*  --  30* 13  CREATININE 0.82 0.70  --  0.66 1.44*  --  0.96 0.71  CALCIUM 9.2  --   --  8.9 9.6  --  8.8* 8.2*  MG  --   --  2.2  --   --   --  2.4  2.5* 1.8  PHOS  --   --  3.3  --   --   --  4.0  --    GFR: Estimated Creatinine Clearance: 98.3 mL/min (by C-G formula based on SCr of 0.71 mg/dL). Liver Function Tests: Recent Labs  Lab 12/25/21 1504 12/29/21 2102 12/30/21 0518 12/31/21 0217  AST 30 79* 67* 61*  ALT 21 40 35 38  ALKPHOS 47 58 47 40  BILITOT 0.5 2.2* 1.4* 1.1  PROT 6.2* 7.2 6.2* 5.2*  ALBUMIN 3.8 4.4 3.7 3.3*   No results for input(s): "LIPASE", "AMYLASE" in the last 168 hours. Recent Labs  Lab 12/26/21 0808 12/27/21 0302 12/29/21 2302  AMMONIA 85* 60* 17   Coagulation Profile: Recent Labs  Lab 12/25/21 1504  INR 1.2   Cardiac Enzymes: Recent Labs  Lab 12/25/21 1922 12/29/21 2102 12/30/21 0518 12/31/21 0217  CKTOTAL 145 1,792* 1,696* 1,025*   BNP (last 3 results) No results for input(s): "PROBNP" in the last 8760 hours. HbA1C: No results for input(s): "HGBA1C" in the last 72 hours. CBG: Recent Labs  Lab 12/25/21 1453  GLUCAP 163*   Lipid Profile: No results for input(s): "CHOL", "HDL", "LDLCALC", "TRIG", "CHOLHDL", "LDLDIRECT" in the  last 72 hours. Thyroid Function Tests: No results for input(s): "TSH", "T4TOTAL", "FREET4", "T3FREE", "THYROIDAB" in the last 72 hours. Anemia Panel: No results for input(s): "VITAMINB12", "FOLATE", "FERRITIN", "TIBC", "IRON", "RETICCTPCT" in the last 72 hours. Sepsis Labs: Recent Labs  Lab 12/29/21 2302 12/30/21 0115  LATICACIDVEN 2.0* 1.6    No results found for this or any previous visit (from the past 240 hour(s)).       Radiology Studies: MR CERVICAL SPINE  WO CONTRAST  Result Date: 12/31/2021 CLINICAL DATA:  Neck trauma EXAM: MRI CERVICAL SPINE WITHOUT CONTRAST TECHNIQUE: Multiplanar, multisequence MR imaging of the cervical spine was performed. No intravenous contrast was administered. COMPARISON:  09/01/2013 FINDINGS: Alignment: Reversal of normal cervical lordosis.  Normal alignment Vertebrae: No fracture, evidence of discitis, or bone lesion. Cord: Normal signal and morphology. Posterior Fossa, vertebral arteries, paraspinal tissues: Negative. Disc levels: C1-2: Unremarkable. C2-3: Normal disc space and facet joints. There is no spinal canal stenosis. No neural foraminal stenosis. C3-4: Normal disc space and facet joints. There is no spinal canal stenosis. No neural foraminal stenosis. C4-5: Normal disc space and facet joints. There is no spinal canal stenosis. No neural foraminal stenosis. C5-6: Small right foraminal disc protrusion. There is no spinal canal stenosis. No neural foraminal stenosis. C6-7: Medium-sized right uncovertebral osteophyte. There is no spinal canal stenosis. No neural foraminal stenosis. C7-T1: Normal disc space and facet joints. There is no spinal canal stenosis. No neural foraminal stenosis. IMPRESSION: 1. No acute abnormality of the cervical spine. 2. No spinal canal or neural foraminal stenosis. Electronically Signed   By: Ulyses Jarred M.D.   On: 12/31/2021 02:14   DG Shoulder Right Portable  Result Date: 12/30/2021 CLINICAL DATA:  Status post right  shoulder reduction. EXAM: RIGHT SHOULDER - 1 VIEW COMPARISON:  Earlier radiograph dated 12/29/2021. FINDINGS: Interval reduction of the previously seen anteriorly dislocated right shoulder, now in anatomic alignment. Focal area in the region of the lateral humeral head suspicious for a Hill-Sachs injury. There is also irregularity of the inferior aspect of the bony glenoid which may be chronic or represent the Bankart lesion. The soft tissues are unremarkable. IMPRESSION: 1. Interval reduction of the previously seen anteriorly dislocated right shoulder, now in anatomic alignment. 2. Possible Hill-Sachs and Bankart lesions. Electronically Signed   By: Anner Crete M.D.   On: 12/30/2021 00:43   CT CHEST ABDOMEN PELVIS W CONTRAST  Result Date: 12/29/2021 CLINICAL DATA:  Head trauma, moderate-severe; Polytrauma, blunt. Fall, found down, right shoulder and back pain EXAM: CT HEAD WITHOUT CONTRAST CT CERVICAL SPINE WITHOUT CONTRAST CT CHEST, ABDOMEN AND PELVIS WITH CONTRAST TECHNIQUE: Contiguous axial images were obtained from the base of the skull through the vertex without intravenous contrast. Multidetector CT imaging of the cervical spine was performed without intravenous contrast. Multiplanar CT image reconstructions were also generated. Multidetector CT imaging of the chest, abdomen and pelvis was performed following the standard protocol during bolus administration of intravenous contrast. RADIATION DOSE REDUCTION: This exam was performed according to the departmental dose-optimization program which includes automated exposure control, adjustment of the mA and/or kV according to patient size and/or use of iterative reconstruction technique. CONTRAST:  3m OMNIPAQUE IOHEXOL 300 MG/ML  SOLN COMPARISON:  CT head 11/25/2021, CT cervical spine 07/28/2014, CTA chest abdomen pelvis 03/03/2016 FINDINGS: CT HEAD FINDINGS Brain: Normal anatomic configuration. Parenchymal volume loss is commensurate with the  patient's age. Mild periventricular white matter changes are present likely reflecting the sequela of small vessel ischemia. No abnormal intra or extra-axial mass lesion or fluid collection. No abnormal mass effect or midline shift. No evidence of acute intracranial hemorrhage or infarct. Ventricular size is normal. Cerebellum unremarkable. Vascular: No asymmetric hyperdense vasculature at the skull base. Skull: Intact Sinuses/Orbits: Paranasal sinuses are clear. Stable remote right medial orbital wall fracture. Orbits are otherwise unremarkable. Other: Mastoid air cells and middle ear cavities are clear. Remote fracture of the left zygomatic arch noted, unchanged. CT CERVICAL FINDINGS Alignment: Normal.  No listhesis. Skull base and vertebrae: Craniocervical alignment is normal. The atlantodental interval is not widened. No acute fracture of the cervical spine. Vertebral body height is preserved. Soft tissues and spinal canal: No prevertebral fluid or swelling. No visible canal hematoma. Moderate atherosclerotic calcification within the carotid bifurcations bilaterally. No cervical adenopathy. Infiltration within subcutaneous fat of the right neck base is noted in keeping with probable edema in this acutely traumatized patient. Disc levels: There is intervertebral disc space narrowing and endplate remodeling at W4-0 in keeping with changes of advanced degenerative disc disease. Mild degenerative changes are seen at C3-C6. Prevertebral soft tissues are not thickened on sagittal reformats. Spinal canal is widely patent. Uncovertebral arthrosis results in mild right neuroforaminal narrowing at C6-7. Remaining neural foramina are widely patent. Other:  None CT CHEST FINDINGS Cardiovascular: Moderate multi-vessel coronary artery calcification. Global cardiac size within normal limits. No pericardial effusion. Central pulmonary arteries are of normal caliber. Mild atherosclerotic calcification within the thoracic aorta.  No aortic aneurysm. Mediastinum/Nodes: Thyroid is unremarkable. No pathologic thoracic adenopathy. Esophagus is unremarkable. No mediastinal hematoma. No pneumomediastinum. Lungs/Pleura: Lungs are clear. No pneumothorax or pleural effusion. Central airways are widely patent. Musculoskeletal: There is anteroinferior right glenohumeral dislocation. Hill-Sachs deformity noted. No definite superimposed fracture identified. Probable degenerative calcification of the inferior glenoid labrum. Osseous structures are otherwise age-appropriate. CT ABDOMEN PELVIS FINDINGS Hepatobiliary: Cholelithiasis without pericholecystic inflammatory change. Liver contour is diffusely nodular in keeping with changes of cirrhosis. No enhancing intrahepatic mass identified. No intra or extrahepatic biliary ductal dilation. Pancreas: Unremarkable Spleen: Unremarkable Adrenals/Urinary Tract: Adrenal glands are unremarkable. Kidneys are normal, without renal calculi, focal lesion, or hydronephrosis. Bladder is unremarkable. Stomach/Bowel: Stomach is within normal limits. Appendix appears normal. No evidence of bowel wall thickening, distention, or inflammatory changes. No free intraperitoneal gas or fluid. Vascular/Lymphatic: Aortic atherosclerosis. No enlarged abdominal or pelvic lymph nodes. Reproductive: Mild prostatic hypertrophy. Other: No abdominal wall hernia. Musculoskeletal: Degenerative changes are seen at the lumbosacral junction. No acute bone abnormality within the abdomen and pelvis. IMPRESSION: 1. No acute intracranial injury. No calvarial fracture. 2. No acute fracture or listhesis of the cervical spine. 3. Anteroinferior right glenohumeral dislocation with Hill-Sachs deformity. No definite superimposed acute fracture identified. 4. No acute intrathoracic or intra-abdominal injury. 5. Moderate coronary artery calcification. 6. Cirrhosis. 7. Cholelithiasis. Electronically Signed   By: Fidela Salisbury M.D.   On: 12/29/2021 23:41    CT Head Wo Contrast  Result Date: 12/29/2021 CLINICAL DATA:  Head trauma, moderate-severe; Polytrauma, blunt. Fall, found down, right shoulder and back pain EXAM: CT HEAD WITHOUT CONTRAST CT CERVICAL SPINE WITHOUT CONTRAST CT CHEST, ABDOMEN AND PELVIS WITH CONTRAST TECHNIQUE: Contiguous axial images were obtained from the base of the skull through the vertex without intravenous contrast. Multidetector CT imaging of the cervical spine was performed without intravenous contrast. Multiplanar CT image reconstructions were also generated. Multidetector CT imaging of the chest, abdomen and pelvis was performed following the standard protocol during bolus administration of intravenous contrast. RADIATION DOSE REDUCTION: This exam was performed according to the departmental dose-optimization program which includes automated exposure control, adjustment of the mA and/or kV according to patient size and/or use of iterative reconstruction technique. CONTRAST:  68m OMNIPAQUE IOHEXOL 300 MG/ML  SOLN COMPARISON:  CT head 11/25/2021, CT cervical spine 07/28/2014, CTA chest abdomen pelvis 03/03/2016 FINDINGS: CT HEAD FINDINGS Brain: Normal anatomic configuration. Parenchymal volume loss is commensurate with the patient's age. Mild periventricular white matter changes are present likely reflecting the sequela of small  vessel ischemia. No abnormal intra or extra-axial mass lesion or fluid collection. No abnormal mass effect or midline shift. No evidence of acute intracranial hemorrhage or infarct. Ventricular size is normal. Cerebellum unremarkable. Vascular: No asymmetric hyperdense vasculature at the skull base. Skull: Intact Sinuses/Orbits: Paranasal sinuses are clear. Stable remote right medial orbital wall fracture. Orbits are otherwise unremarkable. Other: Mastoid air cells and middle ear cavities are clear. Remote fracture of the left zygomatic arch noted, unchanged. CT CERVICAL FINDINGS Alignment: Normal.  No  listhesis. Skull base and vertebrae: Craniocervical alignment is normal. The atlantodental interval is not widened. No acute fracture of the cervical spine. Vertebral body height is preserved. Soft tissues and spinal canal: No prevertebral fluid or swelling. No visible canal hematoma. Moderate atherosclerotic calcification within the carotid bifurcations bilaterally. No cervical adenopathy. Infiltration within subcutaneous fat of the right neck base is noted in keeping with probable edema in this acutely traumatized patient. Disc levels: There is intervertebral disc space narrowing and endplate remodeling at Q7-6 in keeping with changes of advanced degenerative disc disease. Mild degenerative changes are seen at C3-C6. Prevertebral soft tissues are not thickened on sagittal reformats. Spinal canal is widely patent. Uncovertebral arthrosis results in mild right neuroforaminal narrowing at C6-7. Remaining neural foramina are widely patent. Other:  None CT CHEST FINDINGS Cardiovascular: Moderate multi-vessel coronary artery calcification. Global cardiac size within normal limits. No pericardial effusion. Central pulmonary arteries are of normal caliber. Mild atherosclerotic calcification within the thoracic aorta. No aortic aneurysm. Mediastinum/Nodes: Thyroid is unremarkable. No pathologic thoracic adenopathy. Esophagus is unremarkable. No mediastinal hematoma. No pneumomediastinum. Lungs/Pleura: Lungs are clear. No pneumothorax or pleural effusion. Central airways are widely patent. Musculoskeletal: There is anteroinferior right glenohumeral dislocation. Hill-Sachs deformity noted. No definite superimposed fracture identified. Probable degenerative calcification of the inferior glenoid labrum. Osseous structures are otherwise age-appropriate. CT ABDOMEN PELVIS FINDINGS Hepatobiliary: Cholelithiasis without pericholecystic inflammatory change. Liver contour is diffusely nodular in keeping with changes of cirrhosis. No  enhancing intrahepatic mass identified. No intra or extrahepatic biliary ductal dilation. Pancreas: Unremarkable Spleen: Unremarkable Adrenals/Urinary Tract: Adrenal glands are unremarkable. Kidneys are normal, without renal calculi, focal lesion, or hydronephrosis. Bladder is unremarkable. Stomach/Bowel: Stomach is within normal limits. Appendix appears normal. No evidence of bowel wall thickening, distention, or inflammatory changes. No free intraperitoneal gas or fluid. Vascular/Lymphatic: Aortic atherosclerosis. No enlarged abdominal or pelvic lymph nodes. Reproductive: Mild prostatic hypertrophy. Other: No abdominal wall hernia. Musculoskeletal: Degenerative changes are seen at the lumbosacral junction. No acute bone abnormality within the abdomen and pelvis. IMPRESSION: 1. No acute intracranial injury. No calvarial fracture. 2. No acute fracture or listhesis of the cervical spine. 3. Anteroinferior right glenohumeral dislocation with Hill-Sachs deformity. No definite superimposed acute fracture identified. 4. No acute intrathoracic or intra-abdominal injury. 5. Moderate coronary artery calcification. 6. Cirrhosis. 7. Cholelithiasis. Electronically Signed   By: Fidela Salisbury M.D.   On: 12/29/2021 23:41   CT Cervical Spine Wo Contrast  Result Date: 12/29/2021 CLINICAL DATA:  Head trauma, moderate-severe; Polytrauma, blunt. Fall, found down, right shoulder and back pain EXAM: CT HEAD WITHOUT CONTRAST CT CERVICAL SPINE WITHOUT CONTRAST CT CHEST, ABDOMEN AND PELVIS WITH CONTRAST TECHNIQUE: Contiguous axial images were obtained from the base of the skull through the vertex without intravenous contrast. Multidetector CT imaging of the cervical spine was performed without intravenous contrast. Multiplanar CT image reconstructions were also generated. Multidetector CT imaging of the chest, abdomen and pelvis was performed following the standard protocol during bolus administration of intravenous contrast.  RADIATION DOSE REDUCTION: This exam was performed according to the departmental dose-optimization program which includes automated exposure control, adjustment of the mA and/or kV according to patient size and/or use of iterative reconstruction technique. CONTRAST:  24m OMNIPAQUE IOHEXOL 300 MG/ML  SOLN COMPARISON:  CT head 11/25/2021, CT cervical spine 07/28/2014, CTA chest abdomen pelvis 03/03/2016 FINDINGS: CT HEAD FINDINGS Brain: Normal anatomic configuration. Parenchymal volume loss is commensurate with the patient's age. Mild periventricular white matter changes are present likely reflecting the sequela of small vessel ischemia. No abnormal intra or extra-axial mass lesion or fluid collection. No abnormal mass effect or midline shift. No evidence of acute intracranial hemorrhage or infarct. Ventricular size is normal. Cerebellum unremarkable. Vascular: No asymmetric hyperdense vasculature at the skull base. Skull: Intact Sinuses/Orbits: Paranasal sinuses are clear. Stable remote right medial orbital wall fracture. Orbits are otherwise unremarkable. Other: Mastoid air cells and middle ear cavities are clear. Remote fracture of the left zygomatic arch noted, unchanged. CT CERVICAL FINDINGS Alignment: Normal.  No listhesis. Skull base and vertebrae: Craniocervical alignment is normal. The atlantodental interval is not widened. No acute fracture of the cervical spine. Vertebral body height is preserved. Soft tissues and spinal canal: No prevertebral fluid or swelling. No visible canal hematoma. Moderate atherosclerotic calcification within the carotid bifurcations bilaterally. No cervical adenopathy. Infiltration within subcutaneous fat of the right neck base is noted in keeping with probable edema in this acutely traumatized patient. Disc levels: There is intervertebral disc space narrowing and endplate remodeling at CO7-5in keeping with changes of advanced degenerative disc disease. Mild degenerative changes  are seen at C3-C6. Prevertebral soft tissues are not thickened on sagittal reformats. Spinal canal is widely patent. Uncovertebral arthrosis results in mild right neuroforaminal narrowing at C6-7. Remaining neural foramina are widely patent. Other:  None CT CHEST FINDINGS Cardiovascular: Moderate multi-vessel coronary artery calcification. Global cardiac size within normal limits. No pericardial effusion. Central pulmonary arteries are of normal caliber. Mild atherosclerotic calcification within the thoracic aorta. No aortic aneurysm. Mediastinum/Nodes: Thyroid is unremarkable. No pathologic thoracic adenopathy. Esophagus is unremarkable. No mediastinal hematoma. No pneumomediastinum. Lungs/Pleura: Lungs are clear. No pneumothorax or pleural effusion. Central airways are widely patent. Musculoskeletal: There is anteroinferior right glenohumeral dislocation. Hill-Sachs deformity noted. No definite superimposed fracture identified. Probable degenerative calcification of the inferior glenoid labrum. Osseous structures are otherwise age-appropriate. CT ABDOMEN PELVIS FINDINGS Hepatobiliary: Cholelithiasis without pericholecystic inflammatory change. Liver contour is diffusely nodular in keeping with changes of cirrhosis. No enhancing intrahepatic mass identified. No intra or extrahepatic biliary ductal dilation. Pancreas: Unremarkable Spleen: Unremarkable Adrenals/Urinary Tract: Adrenal glands are unremarkable. Kidneys are normal, without renal calculi, focal lesion, or hydronephrosis. Bladder is unremarkable. Stomach/Bowel: Stomach is within normal limits. Appendix appears normal. No evidence of bowel wall thickening, distention, or inflammatory changes. No free intraperitoneal gas or fluid. Vascular/Lymphatic: Aortic atherosclerosis. No enlarged abdominal or pelvic lymph nodes. Reproductive: Mild prostatic hypertrophy. Other: No abdominal wall hernia. Musculoskeletal: Degenerative changes are seen at the lumbosacral  junction. No acute bone abnormality within the abdomen and pelvis. IMPRESSION: 1. No acute intracranial injury. No calvarial fracture. 2. No acute fracture or listhesis of the cervical spine. 3. Anteroinferior right glenohumeral dislocation with Hill-Sachs deformity. No definite superimposed acute fracture identified. 4. No acute intrathoracic or intra-abdominal injury. 5. Moderate coronary artery calcification. 6. Cirrhosis. 7. Cholelithiasis. Electronically Signed   By: AFidela SalisburyM.D.   On: 12/29/2021 23:41   CT L-SPINE NO CHARGE  Result Date: 12/29/2021 CLINICAL DATA:  Initial evaluation for  acute trauma. EXAM: CT LUMBAR SPINE WITHOUT CONTRAST TECHNIQUE: Multidetector CT imaging of the lumbar spine was performed without intravenous contrast administration. Multiplanar CT image reconstructions were also generated. RADIATION DOSE REDUCTION: This exam was performed according to the departmental dose-optimization program which includes automated exposure control, adjustment of the mA and/or kV according to patient size and/or use of iterative reconstruction technique. COMPARISON:  Prior MRI from 02/16/2016. FINDINGS: Segmentation: Standard. Lowest well-formed disc space labeled the L5-S1 level. Alignment: Trace degenerative retrolisthesis of L5 on S1. Alignment otherwise normal with preservation of the normal lumbar lordosis. Vertebrae: Vertebral body height maintained without acute or chronic fracture. Visualized sacrum and pelvis intact. SI joints symmetric and within normal limits. No discrete or worrisome osseous lesions. Paraspinal and other soft tissues: Paraspinous soft tissues demonstrate no acute finding. Aorto bi-iliac atherosclerotic disease. Disc levels: L1-2:  Unremarkable. L2-3: Mild endplate spurring without significant disc bulge. Mild facet spurring. No stenosis. L3-4: Mild disc bulge with endplate spurring. Mild facet hypertrophy. No significant spinal stenosis. Foramina remain patent.  L4-5: Mild disc bulge with reactive endplate spurring. Mild facet hypertrophy. Mild bilateral subarticular stenosis. Foramina remain patent. L5-S1: Degenerative intervertebral disc space narrowing with diffuse disc bulge and reactive endplate spurring. Mild to moderate facet hypertrophy. No spinal stenosis. Moderate bilateral L5 foraminal stenosis. IMPRESSION: 1. No acute traumatic injury within the lumbar spine. 2. Degenerative spondylosis at L5-S1 with resultant moderate bilateral L5 foraminal stenosis. 3. Mild bilateral subarticular stenosis at L4-5 related to disc bulge and facet hypertrophy. Aortic Atherosclerosis (ICD10-I70.0).  She Electronically Signed   By: Jeannine Boga M.D.   On: 12/29/2021 23:36   CT T-SPINE NO CHARGE  Result Date: 12/29/2021 CLINICAL DATA:  Initial evaluation for acute trauma. EXAM: CT THORACIC SPINE WITHOUT CONTRAST TECHNIQUE: Multidetector CT images of the thoracic were obtained using the standard protocol without intravenous contrast. RADIATION DOSE REDUCTION: This exam was performed according to the departmental dose-optimization program which includes automated exposure control, adjustment of the mA and/or kV according to patient size and/or use of iterative reconstruction technique. COMPARISON:  None Available. FINDINGS: Alignment: Physiologic with preservation of the normal thoracic kyphosis. No listhesis. Vertebrae: Vertebral body height maintained without acute or chronic fracture. Visualized ribs intact. No discrete or worrisome osseous lesions. Paraspinal and other soft tissues: Paraspinous soft tissues demonstrate no acute finding. Scattered aortic atherosclerosis. Mild upper lobe predominant centrilobular emphysema. Cholelithiasis noted. Disc levels: Few scattered degenerative endplate Schmorl's node deformities with endplate spurring noted within the mid and lower thoracic spine. No significant spinal stenosis by CT. IMPRESSION: 1. No acute traumatic injury  within the thoracic spine. 2. Mild upper lobe predominant centrilobular emphysema. 3. Cholelithiasis. Aortic Atherosclerosis (ICD10-I70.0) and Emphysema (ICD10-J43.9). Electronically Signed   By: Jeannine Boga M.D.   On: 12/29/2021 23:28   DG Shoulder Right  Result Date: 12/29/2021 CLINICAL DATA:  Pain after fall EXAM: RIGHT SHOULDER - 2+ VIEW COMPARISON:  None Available. FINDINGS: The shoulder is anteriorly dislocated. Mild irregularity is identified along the posterior aspect of the glenoid. AC joint degenerative changes are identified. No fractures. No other acute abnormalities. IMPRESSION: Anterior shoulder dislocation. Irregularity along the posterior lip of the glenoid is age indeterminate but could be secondary to dislocation. Electronically Signed   By: Dorise Bullion III M.D.   On: 12/29/2021 22:15   DG Pelvis 1-2 Views  Result Date: 12/29/2021 CLINICAL DATA:  Found down EXAM: PELVIS - 1-2 VIEW COMPARISON:  07/01/2013 FINDINGS: Single frontal view of the pelvis demonstrates no acute displaced  fractures. Mild symmetrical bilateral hip osteoarthritis. Alignment is anatomic. Prominent spondylosis and facet hypertrophy at the lumbosacral junction. IMPRESSION: 1. Degenerative changes of the lumbar spine and bilateral hips. 2. No acute fracture. Electronically Signed   By: Randa Ngo M.D.   On: 12/29/2021 22:15   DG Chest 2 View  Result Date: 12/29/2021 CLINICAL DATA:  Found down, right shoulder and back pain, dyspnea EXAM: CHEST - 2 VIEW COMPARISON:  08/17/2017 FINDINGS: Frontal and lateral views of the chest demonstrate an unremarkable cardiac silhouette. No acute airspace disease, effusion, or pneumothorax. Likely anterior glenohumeral dislocation right shoulder. No acute displaced fracture. IMPRESSION: 1. Anterior dislocation of the right glenohumeral joint. 2. No acute intrathoracic process. Electronically Signed   By: Randa Ngo M.D.   On: 12/29/2021 22:14        Scheduled  Meds:  aspirin EC  81 mg Oral Daily   busPIRone  15 mg Oral TID   divalproex  500 mg Oral Q8H   enoxaparin (LOVENOX) injection  40 mg Subcutaneous Q24H   metoprolol succinate  12.5 mg Oral Daily   mometasone-formoterol  2 puff Inhalation BID   OXcarbazepine  150 mg Oral BID   potassium chloride  40 mEq Oral Once   QUEtiapine  400 mg Oral QHS   sertraline  200 mg Oral Daily   tamsulosin  0.8 mg Oral Daily   Continuous Infusions:  sodium chloride 100 mL/hr at 12/31/21 0019     LOS: 1 day   Time spent= 35 mins    Jimmey Hengel Arsenio Loader, MD [facility]  If 7PM-7AM, please contact night-coverage  12/31/2021, 8:24 AM

## 2021-12-31 NOTE — Progress Notes (Addendum)
Pharmacy Consult for Levocarnitine  66 yo M on divalproate '500mg'$  TID prior to admission. Patient was admitted 7/12 -7/14 and readmitted 7/16. VPA level on 7/12 @ 15:14 (prior to any hospital dose administrations) was high at 120 but unclear if this was a true trough. Patient was supposed to decrease to '500mg'$  BID but missed this instruction and continued to take TID. On Med rec 7/17, patient reported last dose was 7/13, when he would have still been in the hospital. Did not take any divalproate at home due to fall and being stuck in a ditch while fishing.  Discussed with Neuro, who agreed with moving the trough level to 7/19 @ 0500 prior to 4th dose of TID dosing. Likely still will not have reached steady state yet. Will start PO levocarnitine '50mg'$ /kg BID to prevent toxicity in the meantime. Dose of '4000mg'$  BID selected (rounded dose using a weight between IBW and ABW given unclear if IBW or ABW should be used in overweight patient). No current signs of valproate toxicity. Platelet level is acutely down but likely due to dilution from IV fluids. AST is mildly elevated, which is likely due to rhabdomyolysis.    Benetta Spar, PharmD, BCPS, BCCP Clinical Pharmacist  Please check AMION for all [place] phone numbers After 10:00 PM, call [place] [phone]

## 2021-12-31 NOTE — Progress Notes (Addendum)
Brief HPI:  66 year old with a history of COPD, diastolic CHF, seizure disorder comes to the hospital with complaints of right-sided shoulder pain and was found to have an acute kidney injury. On his first admission he had a anterior right shoulder dislocation that was reduced in the ED.  He was discharged on 7/13 in stable condition and then returned on 7/16 after being found in a ditch after 3 days. On this admission he was found to have an acute kidney injury, rhabdomyolysis and had breakthrough seizure activity witnessed by EMS an ED staff.  Depakote adjusted to '500mg'$  TID and oxycarbazepine continued. MRI C-spine negative. EEG within normal limits.   Subjective: Seen in room, laying in bed. Patient states he didn't sleep well overnight. EEG was within normal limits  Exam: Current vital signs: BP 111/65 (BP Location: Left Arm)   Pulse 90   Temp 98.2 F (36.8 C) (Oral)   Resp 18   Ht '5\' 8"'$  (1.727 m)   Wt 91.5 kg   SpO2 94%   BMI 30.67 kg/m  Vital signs in last 24 hours: Temp:  [97.9 F (36.6 C)-98.6 F (37 C)] 98.2 F (36.8 C) (07/18 0748) Pulse Rate:  [66-98] 90 (07/18 0748) Resp:  [14-26] 18 (07/18 0748) BP: (94-159)/(54-136) 111/65 (07/18 0748) SpO2:  [91 %-96 %] 94 % (07/18 0817) Weight:  [91.5 kg] 91.5 kg (07/18 0716)   Gen: In bed, NAD Resp: non-labored breathing, no acute distress Abd: soft, nt  Neuro: MS: Aox4, drowsy, requires some redirection during exam CN: PERRL, EOMI, facial sensation is intact, no facial asymmetry notes, phonation normal, hearing intact, tongue protrudes midline.  Motor: RUE remains in sling, movement is limited by pain. 5/5 strength in remaining extremities.  No new asterixis, but he does have his baseline tremor Sensory: intact to light touch in all extremities DTR:2+ throughout  Pertinent Labs: K 3.4  CK 1025, improving AST stable at 61 Leukocytosis resolved  MRI C-Spine - no acute abnormality  Assessment: 66 year old male with a  history of seizure disorder. He was trapped in a ditch for 3 days and subsequently had an episode of seizure activity while being extracted from the ditch. He states he was taking his depakote '500mg'$  TID despite it being reduced to BID during his last hospitalization.   Reported a possible seziure event to NP on her evaluation but to the attending reports he feels that symptoms are well controlled; given somnolence this AM reasonable to check depakote level this evening, as well as ammonia. Will also try to start L-carnitine and monitor.    AST elevation may reflect muscle injury in the setting of rhabdomyolysis, will continue to monitor  Impression:  Breakthrough seizure activity due to missed doses of oral AEDs  Recommendations: - IV lorazepam 1-4 mg for further seizure activity - Continue divalproex 500 mg TID; continue oxcarbazepine 150 mg BID  - Depakote trough level ordered for 20:00 today - Pharmacy consult, would like to add L-carnitine but does not seem to be in formulary (helps with possible HE from dekpakote)  [email or web address] - Appreciate management of shoulder pain and other comorbidities per primary team - Neurology will follow  Patient seen and examined by NP/APP with MD. MD to update note as needed.   Janine Ores, DNP, FNP-BC [facility] Pager: [phone]  Attending Neurologist's note:  I personally saw this patient, gathering history, performing a neurologic examination, reviewing relevant labs, and formulated the assessment and plan, adding the note above for  completeness and clarity to accurately reflect my thoughts

## 2021-12-31 NOTE — Progress Notes (Signed)
Pt transported to MRI . Pt stated  is claustrophobic ativan given

## 2021-12-31 NOTE — Evaluation (Signed)
Physical Therapy Evaluation Patient Details Name: [name] MRN: [id number] DOB: 09/26/1955 Today's Date: 12/31/2021  History of Present Illness  [name] is a 66 y.o. male , who is admitted to [facility] on 12/29/2021 with acute kidney injury after presenting to [facility] ED complaining of right shoulder pain after a fall into a ditch, where he stayed for 3 days; R shoulder dislocated, now reduced and sling ordered; noteworthy recent admission with encephalopathy due to depakote toxicity; with medical history significant for COPD, chronic diastolic heart failure, seizure disorder  Clinical Impression   Pt admitted with above diagnosis. Lives at home alone, in a single-level home with 5 steps to enter and rail on Left; Prior to admission, pt was managing independently, per PT eval on 7/14, independent with all mobility; Presents to PT with generalized pain as well as R shoulder pain, gait instability and inefficiency, high fall risk;  Pt currently with functional limitations due to the deficits listed below (see PT Problem List). Pt will benefit from skilled PT to increase their independence and safety with mobility to allow discharge to the venue listed below.       Given he lives alone, he will benefit from post-acute rehab to maximize independence and safety with mobility and ADLs prior to getting back home; If he makes good, quick progress medically and functionally, we can consider home with [facility] therapies f/u     Recommendations for follow up therapy are one component of a multi-disciplinary discharge planning process, led by the attending physician.  Recommendations may be updated based on patient status, additional functional criteria and insurance authorization.  Follow Up Recommendations Skilled nursing-short term rehab (<3 hours/day) (if quick progress, we can consider home) Can patient physically be transported by private vehicle: Yes    Assistance Recommended at Discharge  Frequent or constant Supervision/Assistance  Patient can return home with the following       Equipment Recommendations Cane;Crutches (Will consider cane vs single crutch, etc)  Recommendations for Other Services  OT consult (as ordered)    Functional Status Assessment Patient has had a recent decline in their functional status and demonstrates the ability to make significant improvements in function in a reasonable and predictable amount of time.     Precautions / Restrictions Precautions Precautions: Fall Required Braces or Orthoses: Sling Restrictions Other Position/Activity Restrictions: Keep R shoulder immobilized in sling for 1 week (until 7/24), then gentle mobilization of the shoulder      Mobility  Bed Mobility Overal bed mobility: Needs Assistance Bed Mobility: Supine to Sit     Supine to sit: Mod assist     General bed mobility comments: Mod L handheld assist to pull to sit    Transfers Overall transfer level: Needs assistance Equipment used: 1 person hand held assist Transfers: Sit to/from Stand Sit to Stand: Mod assist           General transfer comment: Stood successfully from bedside after several tries; tending to brace backs of legs against bed for stability during rise    Ambulation/Gait Ambulation/Gait assistance: Mod assist Gait Distance (Feet): 5 Feet (forward and backward) Assistive device: 1 person hand held assist Gait Pattern/deviations: Wide base of support, Staggering left, Staggering right       General Gait Details: Very short, wide steps, with decr weight shift fully into stance R or LLE  Stairs            Wheelchair Mobility    Modified Rankin (Stroke  Patients Only)       Balance Overall balance assessment: Needs assistance           Standing balance-Leahy Scale: Poor                               Pertinent Vitals/Pain Pain Assessment Pain Assessment: Faces Faces Pain Scale: Hurts even  more Pain Location: R shoulder with moving Pain Descriptors / Indicators: Grimacing Pain Intervention(s): Monitored during session, Other (comment) (adjusted sling for optimal fit)    Home Living Family/patient expects to be discharged to:: Private residence Living Arrangements: Alone Available Help at Discharge: Family;Neighbor;Available PRN/intermittently Type of Home: Mobile home Home Access: Stairs to enter Entrance Stairs-Rails: Left Entrance Stairs-Number of Steps: 5   Home Layout: One level Home Equipment: Cane - single point;Shower seat;Grab bars - tub/shower;Grab bars - toilet      Prior Function Prior Level of Function : Independent/Modified Independent             Mobility Comments: Uses cane for ambulation       Hand Dominance   Dominant Hand: Right    Extremity/Trunk Assessment   Upper Extremity Assessment Upper Extremity Assessment: Defer to OT evaluation (Adjusted R sling for better fit)    Lower Extremity Assessment Lower Extremity Assessment: Generalized weakness (and uncoordinated steps)       Communication   Communication: No difficulties  Cognition Arousal/Alertness: Awake/alert Behavior During Therapy: WFL for tasks assessed/performed Overall Cognitive Status: No family/caregiver present to determine baseline cognitive functioning                                 General Comments: Pleasant and cooperative; Shows awareness that his balance is not normal        General Comments General comments (skin integrity, edema, etc.): Noted IV in a precarious position on LUE; notifed RN    Exercises     Assessment/Plan    PT Assessment Patient needs continued PT services  PT Problem List Decreased strength;Decreased range of motion;Decreased activity tolerance;Decreased balance;Decreased mobility;Decreased coordination;Decreased cognition;Decreased knowledge of use of DME;Decreased safety awareness;Pain       PT Treatment  Interventions DME instruction;Gait training;Stair training;Functional mobility training;Therapeutic activities;Therapeutic exercise;Balance training;Neuromuscular re-education;Cognitive remediation;Patient/family education    PT Goals (Current goals can be found in the Care Plan section)  Acute Rehab PT Goals Patient Stated Goal: less R shoulder pain PT Goal Formulation: With patient Time For Goal Achievement: 01/14/22 Potential to Achieve Goals: Good    Frequency Min 4X/week     Co-evaluation               AM-PAC PT "6 Clicks" Mobility  Outcome Measure Help needed turning from your back to your side while in a flat bed without using bedrails?: A Little Help needed moving from lying on your back to sitting on the side of a flat bed without using bedrails?: A Lot Help needed moving to and from a bed to a chair (including a wheelchair)?: A Lot Help needed standing up from a chair using your arms (e.g., wheelchair or bedside chair)?: A Lot Help needed to walk in hospital room?: A Lot Help needed climbing 3-5 steps with a railing? : Total 6 Click Score: 12    End of Session Equipment Utilized During Treatment: Gait belt (sling) Activity Tolerance: Patient tolerated treatment well Patient left: in bed;with call bell/phone  within reach;with bed alarm set Nurse Communication: Mobility status (Needs IV check) PT Visit Diagnosis: Unsteadiness on feet (R26.81);Other abnormalities of gait and mobility (R26.89);Pain Pain - Right/Left: Right Pain - part of body: Shoulder    Time: 5462-7035 PT Time Calculation (min) (ACUTE ONLY): 19 min   Charges:   PT Evaluation $PT Eval Moderate Complexity: Linnell Camp, PT  Acute Rehabilitation Services Office [phone]   Colletta [place] 12/31/2021, 2:43 PM

## 2022-01-01 ENCOUNTER — Other Ambulatory Visit (HOSPITAL_COMMUNITY): Payer: Self-pay

## 2022-01-01 ENCOUNTER — Telehealth (HOSPITAL_COMMUNITY): Payer: Self-pay | Admitting: Pharmacy Technician

## 2022-01-01 DIAGNOSIS — N179 Acute kidney failure, unspecified: Secondary | ICD-10-CM | POA: Diagnosis not present

## 2022-01-01 LAB — PHOSPHORUS: Phosphorus: 3.3 mg/dL (ref 2.5–4.6)

## 2022-01-01 LAB — CBC
HCT: 31.7 % — ABNORMAL LOW (ref 39.0–52.0)
Hemoglobin: 11.3 g/dL — ABNORMAL LOW (ref 13.0–17.0)
MCH: 33.4 pg (ref 26.0–34.0)
MCHC: 35.6 g/dL (ref 30.0–36.0)
MCV: 93.8 fL (ref 80.0–100.0)
Platelets: 76 10*3/uL — ABNORMAL LOW (ref 150–400)
RBC: 3.38 MIL/uL — ABNORMAL LOW (ref 4.22–5.81)
RDW: 13.6 % (ref 11.5–15.5)
WBC: 3.6 10*3/uL — ABNORMAL LOW (ref 4.0–10.5)
nRBC: 0 % (ref 0.0–0.2)

## 2022-01-01 LAB — COMPREHENSIVE METABOLIC PANEL
ALT: 33 U/L (ref 0–44)
AST: 39 U/L (ref 15–41)
Albumin: 2.8 g/dL — ABNORMAL LOW (ref 3.5–5.0)
Alkaline Phosphatase: 40 U/L (ref 38–126)
Anion gap: 10 (ref 5–15)
BUN: 9 mg/dL (ref 8–23)
CO2: 23 mmol/L (ref 22–32)
Calcium: 8.4 mg/dL — ABNORMAL LOW (ref 8.9–10.3)
Chloride: 106 mmol/L (ref 98–111)
Creatinine, Ser: 0.78 mg/dL (ref 0.61–1.24)
GFR, Estimated: >60 mL/min (ref >60)
Glucose, Bld: 103 mg/dL — ABNORMAL HIGH (ref 70–99)
Potassium: 3.4 mmol/L — ABNORMAL LOW (ref 3.5–5.1)
Sodium: 139 mmol/L (ref 135–145)
Total Bilirubin: 1.1 mg/dL (ref 0.3–1.2)
Total Protein: 5.1 g/dL — ABNORMAL LOW (ref 6.5–8.1)

## 2022-01-01 LAB — VALPROIC ACID LEVEL: Valproic Acid Lvl: 99 ug/mL (ref 50.0–100.0)

## 2022-01-01 LAB — CK: Total CK: 411 U/L — ABNORMAL HIGH (ref 49–397)

## 2022-01-01 LAB — MAGNESIUM: Magnesium: 2.1 mg/dL (ref 1.7–2.4)

## 2022-01-01 MED ORDER — OXYCODONE HCL 5 MG PO TABS
5.0000 mg | ORAL_TABLET | ORAL | 0 refills | Status: DC | PRN
Start: 2022-01-01 — End: 2022-09-18

## 2022-01-01 MED ORDER — DIVALPROEX SODIUM 500 MG PO DR TAB
500.0000 mg | DELAYED_RELEASE_TABLET | Freq: Two times a day (BID) | ORAL | Status: DC
  Administered 2022-01-01 – 2022-01-02 (×2): 500 mg via ORAL
  Filled 2022-01-01 (×2): qty 1

## 2022-01-01 MED ORDER — METOPROLOL SUCCINATE ER 25 MG PO TB24: 12.5000 mg | ORAL_TABLET | Freq: Every day | ORAL | 1 refills | Status: DC

## 2022-01-01 MED ORDER — LACTULOSE 10 GM/15ML PO SOLN
30.0000 g | Freq: Three times a day (TID) | ORAL | Status: DC
  Administered 2022-01-01 – 2022-01-02 (×4): 30 g via ORAL
  Filled 2022-01-01 (×4): qty 45

## 2022-01-01 MED ORDER — DIVALPROEX SODIUM 500 MG PO DR TAB: 500.0000 mg | DELAYED_RELEASE_TABLET | Freq: Two times a day (BID) | ORAL | Status: DC

## 2022-01-01 MED ORDER — LEVOCARNITINE 1 GM/10ML PO SOLN: 4000.0000 mg | Freq: Two times a day (BID) | ORAL | 12 refills | Status: DC

## 2022-01-01 MED ORDER — SENNOSIDES-DOCUSATE SODIUM 8.6-50 MG PO TABS: 1.0000 | ORAL_TABLET | Freq: Every evening | ORAL | Status: DC | PRN

## 2022-01-01 MED ORDER — POTASSIUM CHLORIDE 20 MEQ PO PACK
40.0000 meq | PACK | Freq: Once | ORAL | Status: AC
  Administered 2022-01-01: 40 meq via ORAL
  Filled 2022-01-01: qty 2

## 2022-01-01 NOTE — Progress Notes (Signed)
Physical Therapy Treatment Patient Details Name: [name] MRN: [id number] DOB: 09/30/1955 Today's Date: 01/01/2022   History of Present Illness [name] is a 66 y.o. male , who is admitted to [facility] on 12/29/2021 with acute kidney injury after presenting to [facility] ED complaining of right shoulder pain after a fall into a ditch, where he stayed for 3 days; R shoulder dislocated, now reduced and sling ordered; noteworthy recent admission with encephalopathy due to depakote toxicity; with medical history significant for COPD, chronic diastolic heart failure, seizure disorder    PT Comments    Continuing work on functional mobility and activity tolerance;  Briefly consulted with Ortho PA, who indicated it will be ok to try using a RW with RUE; MUCH more steady with amb with bil UE support on RW; still with generalized pain, but good progress towards recovery;   In discussing concerns about safety being home alone (unsteady gait, dominant arm immobilized), pt gave it some thought, and indicated he can go to father's home and stay for a few days with sister and father; I indicated I really like this plan, and encouraged him to speak with his sister about the possibility  Recommendations for follow up therapy are one component of a multi-disciplinary discharge planning process, led by the attending physician.  Recommendations may be updated based on patient status, additional functional criteria and insurance authorization.  Follow Up Recommendations  Home health PT     Assistance Recommended at Discharge Intermittent Supervision/Assistance  Patient can return home with the following     Equipment Recommendations  Rolling walker (2 wheels)    Recommendations for Other Services OT consult (as ordered)     Precautions / Restrictions Precautions Precautions: Fall Required Braces or Orthoses: Sling Restrictions Other Position/Activity Restrictions: Keep R shoulder  immobilized in sling for 1 week (until 7/24), then gentle mobilization of the shoulder; Per Ortho PA, OK to use RUE with RW     Mobility  Bed Mobility Overal bed mobility: Needs Assistance Bed Mobility: Supine to Sit     Supine to sit: Mod assist     General bed mobility comments: Mod L handheld assist to pull to sit    Transfers Overall transfer level: Needs assistance Equipment used: Rolling walker (2 wheels) Transfers: Sit to/from Stand Sit to Stand: Min guard           General transfer comment: Cues for hand placement    Ambulation/Gait Ambulation/Gait assistance: Min guard, Min assist Gait Distance (Feet): 100 Feet Assistive device: Rolling walker (2 wheels) Gait Pattern/deviations: Step-through pattern, Decreased step length - right, Decreased step length - left, Decreased stride length       General Gait Details: Slow pace; adjusted RW height for optimal fit; occasional need for min assist to turn/manage RW; Noteworthy that pt must stop walking when he is talking   Chief Strategy Officer    Modified Rankin (Stroke Patients Only)       Balance             Standing balance-Leahy Scale: Poor                              Cognition Arousal/Alertness: Awake/alert Behavior During Therapy: WFL for tasks assessed/performed Overall Cognitive Status: No family/caregiver present to determine baseline cognitive functioning  General Comments: Pleasant and cooperative; Shows awareness that his balance is not normal        Exercises      General Comments General comments (skin integrity, edema, etc.): Pt able to identify that managing at home will be difficult with his dominant arm in sling, and with his balance deficits; He indicated he can stay with his sister and their father, where he will have at or near 69 hour assist      Pertinent Vitals/Pain Pain Assessment Pain  Assessment: Faces Faces Pain Scale: Hurts little more Pain Location: R shoulder with moving; also reports generalized pain Pain Descriptors / Indicators: Grimacing Pain Intervention(s): Monitored during session, Limited activity within patient's tolerance    Home Living                          Prior Function            PT Goals (current goals can now be found in the care plan section) Acute Rehab PT Goals Patient Stated Goal: less R shoulder pain PT Goal Formulation: With patient Time For Goal Achievement: 01/14/22 Potential to Achieve Goals: Good Progress towards PT goals: Progressing toward goals    Frequency    Min 4X/week      PT Plan Discharge plan needs to be updated;Equipment recommendations need to be updated    Co-evaluation              AM-PAC PT "6 Clicks" Mobility   Outcome Measure  Help needed turning from your back to your side while in a flat bed without using bedrails?: A Little Help needed moving from lying on your back to sitting on the side of a flat bed without using bedrails?: A Lot Help needed moving to and from a bed to a chair (including a wheelchair)?: A Little Help needed standing up from a chair using your arms (e.g., wheelchair or bedside chair)?: A Little Help needed to walk in hospital room?: A Little Help needed climbing 3-5 steps with a railing? : A Lot 6 Click Score: 16    End of Session Equipment Utilized During Treatment: Gait belt (sling) Activity Tolerance: Patient tolerated treatment well Patient left: in bed;with call bell/phone within reach;with bed alarm set Nurse Communication: Mobility status PT Visit Diagnosis: Unsteadiness on feet (R26.81);Other abnormalities of gait and mobility (R26.89);Pain Pain - Right/Left: Right Pain - part of body: Shoulder     Time: 1150-1206 PT Time Calculation (min) (ACUTE ONLY): 16 min  Charges:  $Gait Training: 8-22 mins                     [name], [place] Office [phone]    [name] 01/01/2022, 2:16 PM
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
## 2022-01-01 NOTE — Telephone Encounter (Signed)
Pharmacy Patient Advocate Encounter  Insurance verification completed.    The patient is insured through [employer] Part D   The patient is currently admitted and ran test claims for the following: lacosamide (Vimpat) 100 mg tablets..  Copays and coinsurance results were relayed to Inpatient clinical team.

## 2022-01-01 NOTE — TOC Progression Note (Addendum)
Transition of Care [facility]) - Progression Note    Patient Details  Name: FIDEL CAGGIANO MRN: [id number] Date of Birth: [birth year]  Transition of Care [facility]) CM/SW Contact  Carles Collet, RN Phone Number: 01/01/2022, 12:59 PM  Clinical Narrative:   Damaris Schooner w MD this morning, potential discharge for today/ tomorrow. Met with patient at bedside. Discussed concerns over readmission and safety at home. He states that his closest family is about 15 miles away. He has a neighbor close by whose house he goes to in the morning for coffee who manages his medications for him daily. Medications get delivered by mail order.  He does not drive, due to seizures. His family/ neighbors provide transportation. He ambulates at home independently with use of a cane sometimes. He is on seizure precautions and not ambulating in the room at this time.  His preference is to go home. He is agreeable to [facility].  Referral made to  [facility]- declined [facility]- declined [place]- declined [place]- declined [place]- declined [facility]- could not confirm [facility] accepted   Will need order for [facility] PT OT.   RW and 3/1 ordered to be delivered to the room today     Expected Discharge Plan: [place] Barriers to Discharge: Continued Medical Work up  Expected Discharge Plan and Services Expected Discharge Plan: [place] In-house Referral: Clinical Social Work   Post Acute Care Choice: [place]                                         Social Determinants of Health (SDOH) Interventions    Readmission Risk Interventions     No data to display
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
## 2022-01-01 NOTE — TOC Benefit Eligibility Note (Signed)
Patient Teacher, English as a foreign language completed.    The patient is currently admitted and upon discharge could be taking lacosamide (Vimpat) 100 mg tablets.  The current 30 day co-pay is, $0.00.   The patient is insured through [employer], [place] Patient Advocate Specialist [place] Patient Advocate Team Direct Number: [phone]  Fax: [phone]

## 2022-01-02 ENCOUNTER — Other Ambulatory Visit: Payer: Self-pay | Admitting: Internal Medicine

## 2022-01-02 ENCOUNTER — Encounter: Payer: Self-pay | Admitting: Internal Medicine

## 2022-01-02 DIAGNOSIS — N179 Acute kidney failure, unspecified: Secondary | ICD-10-CM | POA: Diagnosis not present

## 2022-01-02 DIAGNOSIS — Z87898 Personal history of other specified conditions: Secondary | ICD-10-CM | POA: Diagnosis not present

## 2022-01-02 LAB — COMPREHENSIVE METABOLIC PANEL
ALT: 33 U/L (ref 0–44)
AST: 35 U/L (ref 15–41)
Albumin: 2.8 g/dL — ABNORMAL LOW (ref 3.5–5.0)
Alkaline Phosphatase: 40 U/L (ref 38–126)
Anion gap: 7 (ref 5–15)
BUN: 9 mg/dL (ref 8–23)
CO2: 25 mmol/L (ref 22–32)
Calcium: 8.5 mg/dL — ABNORMAL LOW (ref 8.9–10.3)
Chloride: 110 mmol/L (ref 98–111)
Creatinine, Ser: 0.69 mg/dL (ref 0.61–1.24)
GFR, Estimated: >60 mL/min (ref >60)
Glucose, Bld: 104 mg/dL — ABNORMAL HIGH (ref 70–99)
Potassium: 3.6 mmol/L (ref 3.5–5.1)
Sodium: 142 mmol/L (ref 135–145)
Total Bilirubin: 0.6 mg/dL (ref 0.3–1.2)
Total Protein: 5.4 g/dL — ABNORMAL LOW (ref 6.5–8.1)

## 2022-01-02 LAB — VALPROIC ACID LEVEL: Valproic Acid Lvl: 68 ug/mL (ref 50.0–100.0)

## 2022-01-02 LAB — CBC
HCT: 32.8 % — ABNORMAL LOW (ref 39.0–52.0)
Hemoglobin: 11.1 g/dL — ABNORMAL LOW (ref 13.0–17.0)
MCH: 32.5 pg (ref 26.0–34.0)
MCHC: 33.8 g/dL (ref 30.0–36.0)
MCV: 95.9 fL (ref 80.0–100.0)
Platelets: 84 10*3/uL — ABNORMAL LOW (ref 150–400)
RBC: 3.42 MIL/uL — ABNORMAL LOW (ref 4.22–5.81)
RDW: 13.8 % (ref 11.5–15.5)
WBC: 2.9 10*3/uL — ABNORMAL LOW (ref 4.0–10.5)
nRBC: 0 % (ref 0.0–0.2)

## 2022-01-02 LAB — AMMONIA: Ammonia: 56 umol/L — ABNORMAL HIGH (ref 9–35)

## 2022-01-02 LAB — MAGNESIUM: Magnesium: 2.2 mg/dL (ref 1.7–2.4)

## 2022-01-02 MED ORDER — LACOSAMIDE 100 MG PO TABS: 100.0000 mg | ORAL_TABLET | Freq: Two times a day (BID) | ORAL | 1 refills | Status: DC

## 2022-01-02 MED ORDER — LACOSAMIDE 100 MG PO TABS: 100.0000 mg | ORAL_TABLET | Freq: Two times a day (BID) | ORAL | 0 refills | Status: DC

## 2022-01-02 MED ORDER — LACOSAMIDE 50 MG PO TABS: 100.0000 mg | ORAL_TABLET | Freq: Two times a day (BID) | ORAL | Status: DC

## 2022-01-02 MED ORDER — LACOSAMIDE 200 MG/20ML IV SOLN
200.0000 mg | Freq: Once | INTRAVENOUS | Status: AC
  Administered 2022-01-02: 200 mg via INTRAVENOUS
  Filled 2022-01-02: qty 20

## 2022-01-02 NOTE — Telephone Encounter (Signed)
Error

## 2022-01-02 NOTE — Discharge Instructions (Signed)
Standard seizure precautions: Per [place] DMV statutes, patients with seizures are not allowed to drive until  they have been seizure-free for six months. Use caution when using heavy equipment or power tools. Avoid working on ladders or at heights. Take showers instead of baths. Ensure the water temperature is not too high on the home water heater. Do not go swimming alone. When caring for infants or small children, sit down when holding, feeding, or changing them to minimize risk of injury to the child in the event you have a seizure.  To reduce risk of seizures, maintain good sleep hygiene avoid alcohol and illicit drug use, take all anti-seizure medications as prescribed.  

## 2022-01-02 NOTE — Care Management Important Message (Signed)
Important Message  Patient Details  Name: [name] MRN: [id number] Date of Birth: 15-Dec-1955   Medicare Important Message Given:  Yes     Orbie Pyo 01/02/2022, 2:53 PM

## 2022-01-02 NOTE — Discharge Summary (Signed)
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 right shoulder and back pain EXAM: CT HEAD WITHOUT CONTRAST CT CERVICAL SPINE WITHOUT CONTRAST CT CHEST, ABDOMEN AND PELVIS WITH CONTRAST TECHNIQUE: Contiguous axial images were obtained from the base of the skull through the vertex without intravenous contrast. Multidetector CT imaging of the cervical spine was performed without intravenous contrast. Multiplanar CT image reconstructions were also generated. Multidetector CT imaging of the chest, abdomen and pelvis was performed following the standard protocol during bolus administration of intravenous contrast. RADIATION DOSE REDUCTION: This exam was performed according to the departmental dose-optimization program which includes automated exposure control, adjustment of the mA and/or kV according to patient size and/or use of iterative reconstruction technique. CONTRAST:  53m OMNIPAQUE IOHEXOL 300 MG/ML  SOLN COMPARISON:  CT head 11/25/2021, CT cervical spine 07/28/2014, CTA chest abdomen pelvis 03/03/2016 FINDINGS: CT HEAD FINDINGS Brain:  Normal anatomic configuration. Parenchymal volume loss is commensurate with the patient's age. Mild periventricular white matter changes are present likely reflecting the sequela of small vessel ischemia. No abnormal intra or extra-axial mass lesion or fluid collection. No abnormal mass effect or midline shift. No evidence of acute intracranial hemorrhage or infarct. Ventricular size is normal. Cerebellum unremarkable. Vascular: No asymmetric hyperdense vasculature at the skull base. Skull: Intact Sinuses/Orbits: Paranasal sinuses are clear. Stable remote right medial orbital wall fracture. Orbits are otherwise unremarkable. Other: Mastoid air cells and middle ear cavities are clear. Remote fracture of the left zygomatic arch noted, unchanged. CT CERVICAL FINDINGS Alignment: Normal.  No listhesis. Skull base and vertebrae: Craniocervical alignment is normal. The atlantodental interval is not widened. No acute fracture of the cervical spine. Vertebral body height is preserved. Soft tissues and spinal canal: No prevertebral fluid or swelling. No visible canal hematoma. Moderate atherosclerotic calcification within the carotid bifurcations bilaterally. No cervical adenopathy. Infiltration within subcutaneous fat of the right neck base is noted in keeping with probable edema in this acutely traumatized patient. Disc levels: There is intervertebral disc space narrowing and endplate remodeling at CK9-1in keeping with changes of advanced degenerative disc disease. Mild degenerative changes are seen at C3-C6. Prevertebral soft tissues are not thickened on sagittal reformats. Spinal canal is widely patent. Uncovertebral arthrosis results in mild right neuroforaminal narrowing at C6-7. Remaining neural foramina are widely patent. Other:  None CT CHEST FINDINGS Cardiovascular: Moderate multi-vessel coronary artery calcification. Global cardiac size within normal limits. No pericardial effusion. Central pulmonary arteries are  of normal caliber. Mild atherosclerotic calcification within the thoracic aorta. No aortic aneurysm. Mediastinum/Nodes: Thyroid is unremarkable. No pathologic thoracic adenopathy. Esophagus is unremarkable. No mediastinal hematoma. No pneumomediastinum. Lungs/Pleura: Lungs are clear. No pneumothorax or pleural effusion. Central airways are widely patent. Musculoskeletal: There is anteroinferior right glenohumeral dislocation. Hill-Sachs deformity noted. No definite superimposed fracture identified. Probable degenerative calcification of the inferior glenoid labrum. Osseous structures are otherwise age-appropriate. CT ABDOMEN PELVIS FINDINGS Hepatobiliary: Cholelithiasis without pericholecystic inflammatory change. Liver contour is diffusely nodular in keeping with changes of cirrhosis. No enhancing intrahepatic mass identified. No intra or extrahepatic biliary ductal dilation. Pancreas: Unremarkable Spleen: Unremarkable Adrenals/Urinary Tract: Adrenal glands are unremarkable. Kidneys are normal, without renal calculi, focal lesion, or hydronephrosis. Bladder is unremarkable. Stomach/Bowel: Stomach is within normal limits. Appendix appears normal. No evidence of bowel wall thickening, distention, or inflammatory changes. No free intraperitoneal gas or fluid. Vascular/Lymphatic: Aortic atherosclerosis. No enlarged abdominal or pelvic lymph nodes. Reproductive: Mild prostatic hypertrophy. Other: No abdominal wall hernia. Musculoskeletal: Degenerative changes are seen at the lumbosacral junction. No acute bone abnormality within the abdomen and pelvis. IMPRESSION: 1. No acute intracranial injury. No calvarial fracture. 2. No acute fracture or listhesis of the cervical spine. 3. Anteroinferior right glenohumeral dislocation with Hill-Sachs deformity. No definite superimposed acute fracture identified. 4. No acute intrathoracic or intra-abdominal injury. 5. Moderate coronary artery calcification. 6. Cirrhosis. 7.  Cholelithiasis. Electronically Signed   By: Fidela Salisbury M.D.   On: 12/29/2021 23:41   CT L-SPINE NO CHARGE  Result Date: 12/29/2021 CLINICAL DATA:  Initial evaluation for acute trauma. EXAM: CT LUMBAR SPINE WITHOUT CONTRAST TECHNIQUE: Multidetector CT imaging of the lumbar spine was performed without intravenous contrast administration. Multiplanar CT image reconstructions were also generated. RADIATION DOSE REDUCTION: This exam was performed according to the departmental dose-optimization program which includes automated exposure control, adjustment of the mA and/or kV according to patient size and/or use of iterative reconstruction technique. COMPARISON:  Prior MRI from 02/16/2016. FINDINGS: Segmentation: Standard. Lowest well-formed disc space labeled the L5-S1 level. Alignment: Trace degenerative retrolisthesis of L5 on S1. Alignment otherwise normal with preservation of the normal lumbar lordosis. Vertebrae: Vertebral body height maintained without acute or chronic fracture. Visualized sacrum and pelvis intact. SI joints symmetric and within normal limits. No discrete or worrisome osseous lesions. Paraspinal and other soft tissues: Paraspinous soft tissues demonstrate no acute finding. Aorto bi-iliac atherosclerotic disease. Disc levels: L1-2:  Unremarkable. L2-3: Mild endplate spurring without significant disc bulge. Mild facet spurring. No stenosis. L3-4: Mild disc bulge with endplate spurring. Mild facet hypertrophy. No significant spinal stenosis. Foramina remain patent. L4-5: Mild disc bulge with reactive endplate spurring. Mild facet hypertrophy. Mild bilateral subarticular stenosis. Foramina remain patent. L5-S1: Degenerative intervertebral disc space narrowing with diffuse disc bulge and reactive endplate spurring. Mild to moderate facet hypertrophy. No spinal stenosis. Moderate bilateral L5 foraminal stenosis. IMPRESSION: 1. No acute traumatic injury within the lumbar spine. 2. Degenerative  spondylosis at L5-S1 with resultant moderate bilateral L5 foraminal stenosis. 3. Mild bilateral subarticular stenosis at L4-5 related to disc bulge and facet hypertrophy. Aortic Atherosclerosis (ICD10-I70.0).  She Electronically Signed   By: Jeannine Boga M.D.   On: 12/29/2021 23:36   CT T-SPINE NO CHARGE  Result Date: 12/29/2021 CLINICAL DATA:  Initial evaluation for acute trauma. EXAM: CT THORACIC SPINE WITHOUT CONTRAST TECHNIQUE: Multidetector CT images of  the thoracic were obtained using the standard protocol without intravenous contrast. RADIATION DOSE REDUCTION: This exam was performed according to the departmental dose-optimization program which includes automated exposure control, adjustment of the mA and/or kV according to patient size and/or use of iterative reconstruction technique. COMPARISON:  None Available. FINDINGS: Alignment: Physiologic with preservation of the normal thoracic kyphosis. No listhesis. Vertebrae: Vertebral body height maintained without acute or chronic fracture. Visualized ribs intact. No discrete or worrisome osseous lesions. Paraspinal and other soft tissues: Paraspinous soft tissues demonstrate no acute finding. Scattered aortic atherosclerosis. Mild upper lobe predominant centrilobular emphysema. Cholelithiasis noted. Disc levels: Few scattered degenerative endplate Schmorl's node deformities with endplate spurring noted within the mid and lower thoracic spine. No significant spinal stenosis by CT. IMPRESSION: 1. No acute traumatic injury within the thoracic spine. 2. Mild upper lobe predominant centrilobular emphysema. 3. Cholelithiasis. Aortic Atherosclerosis (ICD10-I70.0) and Emphysema (ICD10-J43.9). Electronically Signed   By: Jeannine Boga M.D.   On: 12/29/2021 23:28   DG Shoulder Right  Result Date: 12/29/2021 CLINICAL DATA:  Pain after fall EXAM: RIGHT SHOULDER - 2+ VIEW COMPARISON:  None Available. FINDINGS: The shoulder is anteriorly dislocated.  Mild irregularity is identified along the posterior aspect of the glenoid. AC joint degenerative changes are identified. No fractures. No other acute abnormalities. IMPRESSION: Anterior shoulder dislocation. Irregularity along the posterior lip of the glenoid is age indeterminate but could be secondary to dislocation. Electronically Signed   By: Dorise Bullion III M.D.   On: 12/29/2021 22:15   DG Pelvis 1-2 Views  Result Date: 12/29/2021 CLINICAL DATA:  Found down EXAM: PELVIS - 1-2 VIEW COMPARISON:  07/01/2013 FINDINGS: Single frontal view of the pelvis demonstrates no acute displaced fractures. Mild symmetrical bilateral hip osteoarthritis. Alignment is anatomic. Prominent spondylosis and facet hypertrophy at the lumbosacral junction. IMPRESSION: 1. Degenerative changes of the lumbar spine and bilateral hips. 2. No acute fracture. Electronically Signed   By: Randa Ngo M.D.   On: 12/29/2021 22:15   DG Chest 2 View  Result Date: 12/29/2021 CLINICAL DATA:  Found down, right shoulder and back pain, dyspnea EXAM: CHEST - 2 VIEW COMPARISON:  08/17/2017 FINDINGS: Frontal and lateral views of the chest demonstrate an unremarkable cardiac silhouette. No acute airspace disease, effusion, or pneumothorax. Likely anterior glenohumeral dislocation right shoulder. No acute displaced fracture. IMPRESSION: 1. Anterior dislocation of the right glenohumeral joint. 2. No acute intrathoracic process. Electronically Signed   By: Randa Ngo M.D.   On: 12/29/2021 22:14   ECHOCARDIOGRAM COMPLETE  Result Date: 12/26/2021    ECHOCARDIOGRAM REPORT   Patient Name:   DEADRICK STIDD [facility] Date of Exam: 12/26/2021 Medical Rec #:  [id number]       Height:       68.0 in Accession #:    [id number]      Weight:       209.0 lb Date of Birth:  1956/05/31      BSA:          2.082 m Patient Age:    19 years        BP:           107/70 mmHg Patient Gender: M               HR:           64 bpm. Exam Location:  Inpatient Procedure: 2D  Echo, Color Doppler and Cardiac Doppler Indications:    Stroke I63.9  History:  Patient has no prior history of Echocardiogram examinations and                 Patient has prior history of Echocardiogram examinations, most                 recent 07/02/2013. CAD and Previous Myocardial Infarction,                 Stroke, Arrythmias:Bradycardia; Risk Factors:Hypertension and                 Dyslipidemia.  Sonographer:    Ronny Flurry Sonographer#2:  Danne Baxter RDCS, FE, PE Referring Phys: [id number] [place]  1. Left ventricular ejection fraction, by estimation, is 55 to 60%. The left ventricle has normal function. The left ventricle demonstrates regional wall motion abnormalities with basal inferior akinesis. There is mild concentric left ventricular hypertrophy. Left ventricular diastolic parameters are consistent with Grade I diastolic dysfunction (impaired relaxation).  2. Right ventricular systolic function is normal. The right ventricular size is normal. Tricuspid regurgitation signal is inadequate for assessing PA pressure.  3. The aortic valve was not well visualized. Aortic valve regurgitation is not visualized. No aortic stenosis is present.  4. The mitral valve is normal in structure. No evidence of mitral valve regurgitation. No evidence of mitral stenosis.  5. The inferior vena cava is normal in size with greater than 50% respiratory variability, suggesting right atrial pressure of 3 mmHg. FINDINGS  Left Ventricle: Left ventricular ejection fraction, by estimation, is 55 to 60%. The left ventricle has normal function. The left ventricle demonstrates regional wall motion abnormalities. The left ventricular internal cavity size was normal in size. There is mild concentric left ventricular hypertrophy. Left ventricular diastolic parameters are consistent with Grade I diastolic dysfunction (impaired relaxation). Right Ventricle: The right ventricular size is normal. No increase in  right ventricular wall thickness. Right ventricular systolic function is normal. Tricuspid regurgitation signal is inadequate for assessing PA pressure. Left Atrium: Left atrial size was normal in size. Right Atrium: Right atrial size was normal in size. Pericardium: There is no evidence of pericardial effusion. Mitral Valve: The mitral valve is normal in structure. No evidence of mitral valve regurgitation. No evidence of mitral valve stenosis. Tricuspid Valve: The tricuspid valve is normal in structure. Tricuspid valve regurgitation is not demonstrated. Aortic Valve: The aortic valve was not well visualized. Aortic valve regurgitation is not visualized. No aortic stenosis is present. Pulmonic Valve: The pulmonic valve was not well visualized. Pulmonic valve regurgitation is not visualized. Aorta: The aortic root is normal in size and structure. Venous: The inferior vena cava is normal in size with greater than 50% respiratory variability, suggesting right atrial pressure of 3 mmHg. IAS/Shunts: No atrial level shunt detected by color flow Doppler.  LEFT VENTRICLE PLAX 2D LVIDd:         4.85 cm   Diastology LVIDs:         4.10 cm   LV e' medial:    6.24 cm/s LV PW:         0.70 cm   LV E/e' medial:  10.1 LV IVS:        1.50 cm   LV e' lateral:   12.60 cm/s LVOT diam:     1.90 cm   LV E/e' lateral: 5.0 LV SV:         72 LV SV Index:   35 LVOT Area:     2.84 cm  RIGHT VENTRICLE RV  S prime:     13.80 cm/s TAPSE (M-mode): 1.1 cm LEFT ATRIUM             Index        RIGHT ATRIUM           Index LA diam:        3.80 cm 1.82 cm/m   RA Area:     15.40 cm LA Vol (A2C):   36.9 ml 17.72 ml/m  RA Volume:   30.90 ml  14.84 ml/m LA Vol (A4C):   29.4 ml 14.12 ml/m LA Biplane Vol: 32.2 ml 15.46 ml/m  AORTIC VALVE LVOT Vmax:   117.00 cm/s LVOT Vmean:  72.100 cm/s LVOT VTI:    0.255 m  AORTA Ao Root diam: 3.50 cm Ao Asc diam:  3.40 cm MITRAL VALVE MV Area (PHT): 2.70 cm    SHUNTS MV Decel Time: 281 msec    Systemic VTI:  0.26  m MV E velocity: 63.00 cm/s  Systemic Diam: 1.90 cm MV A velocity: 70.30 cm/s MV E/A ratio:  0.90 Dalton McleanMD Electronically signed by Franki Monte Signature Date/Time: 12/26/2021/3:10:58 PM    Final    Overnight EEG with video  Result Date: 12/26/2021 Lora Havens, MD     12/26/2021  2:24 PM Patient Name: DOUGLASS DUNSHEE MRN: [id number] Epilepsy Attending: Lora Havens Referring Physician/Provider: Amie Portland, MD Duration: 12/25/2021 1959 to 12/26/2021 1336  Patient history: : 66 year old with past medical history of substance abuse, seizures with questionable compliance to Depakote presenting for evaluation of sudden inability to talk. EEG to evaluate for seizure  Level of alertness: Awake, asleep  AEDs during EEG study: VPA  Technical aspects: This EEG study was done with scalp electrodes positioned according to the 10-20 International system of electrode placement. Electrical activity was acquired at a sampling rate of '500Hz'$  and reviewed with a high frequency filter of '70Hz'$  and a low frequency filter of '1Hz'$ . EEG data were recorded continuously and digitally stored.  Description: The posterior dominant rhythm consists of 8 Hz activity of moderate voltage (25-35 uV) seen predominantly in posterior head regions, symmetric and reactive to eye opening and eye closing.  Sleep was characterized by sleep spindles (12 to 14 Hz), maximal frontocentral region.  Hyperventilation and photic stimulation were not performed.    IMPRESSION: This study is within normal limits. No seizures or epileptiform discharges were seen throughout the recording.  Lora Havens   EEG adult  Result Date: 12/26/2021 Lora Havens, MD     12/26/2021  8:47 AM Patient Name: MERWIN BREDEN MRN: [id number] Epilepsy Attending: Lora Havens Referring Physician/Provider: Regan Lemming, MD Date: 12/25/2021 Duration: 22.53 mins Patient history: : 65 year old with past medical history of substance abuse, seizures with  questionable compliance to Depakote presenting for evaluation of sudden inability to talk. EEG to evaluate for seizure Level of alertness: Awake AEDs during EEG study: VPA Technical aspects: This EEG study was done with scalp electrodes positioned according to the 10-20 International system of electrode placement. Electrical activity was acquired at a sampling rate of '500Hz'$  and reviewed with a high frequency filter of '70Hz'$  and a low frequency filter of '1Hz'$ . EEG data were recorded continuously and digitally stored. Description: The posterior dominant rhythm consists of 8 Hz activity of moderate voltage (25-35 uV) seen predominantly in posterior head regions, symmetric and reactive to eye opening and eye closing.  Hyperventilation and photic stimulation were not performed.   IMPRESSION: This study is  within normal limits. No seizures or epileptiform discharges were seen throughout the recording. Lora Havens   MR BRAIN WO CONTRAST  Result Date: 12/25/2021 CLINICAL DATA:  Mental status change, unknown cause EXAM: MRI HEAD WITHOUT CONTRAST TECHNIQUE: Multiplanar, multiecho pulse sequences of the brain and surrounding structures were obtained without intravenous contrast. COMPARISON:  CT head from the same day.  MRI September 01, 2013. FINDINGS: Brain: No acute infarction, hemorrhage, hydrocephalus, extra-axial collection or mass lesion. Cerebral atrophy with prominence of the extra-axial spaces. Vascular: Major arterial flow voids are maintained skull base. Skull and upper cervical spine: Normal marrow signal. Sinuses/Orbits: Mild paranasal sinus mucosal thickening. Remote right medial orbital wall fracture. No acute orbital findings. Other: No mastoid effusions. IMPRESSION: 1. No evidence of acute intracranial abnormality. 2.  Cerebral atrophy (ICD10-G31.9). Electronically Signed   By: Margaretha Sheffield M.D.   On: 12/25/2021 16:30   CT HEAD CODE STROKE WO CONTRAST  Result Date: 12/25/2021 CLINICAL DATA:  Code  stroke.  Neuro deficit, acute, stroke suspected EXAM: CT HEAD WITHOUT CONTRAST TECHNIQUE: Contiguous axial images were obtained from the base of the skull through the vertex without intravenous contrast. RADIATION DOSE REDUCTION: This exam was performed according to the departmental dose-optimization program which includes automated exposure control, adjustment of the mA and/or kV according to patient size and/or use of iterative reconstruction technique. COMPARISON:  12/11/2021 FINDINGS: Brain: No acute intracranial hemorrhage, mass effect, or edema. No new loss of gray-white differentiation. Prominence of the ventricles and sulci reflects stable parenchymal volume loss. No extra-axial collection. Vascular: No hyperdense vessel. There is intracranial atherosclerotic calcification at the skull base Skull: No acute abnormality. Sinuses/Orbits: No acute abnormality. Other: Mastoid air cells are clear. ASPECTS ([place] Stroke Program Early CT Score) - Ganglionic level infarction (caudate, lentiform nuclei, internal capsule, insula, M1-M3 cortex): 7 - Supraganglionic infarction (M4-M6 cortex): 3 Total score (0-10 with 10 being normal): 10 IMPRESSION: There is no acute intracranial hemorrhage or evidence of acute infarction. ASPECT score is 10. These results were communicated to Dr. Rory Percy at 3:14 pm on 12/25/2021 by text page via the [facility] messaging system. Electronically Signed   By: Macy Mis M.D.   On: 12/25/2021 15:15   CT Head Wo Contrast  Result Date: 12/11/2021 CLINICAL DATA:  Trauma EXAM: CT HEAD WITHOUT CONTRAST TECHNIQUE: Contiguous axial images were obtained from the base of the skull through the vertex without intravenous contrast. RADIATION DOSE REDUCTION: This exam was performed according to the departmental dose-optimization program which includes automated exposure control, adjustment of the mA and/or kV according to patient size and/or use of iterative reconstruction technique. COMPARISON:   03/19/2016 FINDINGS: Brain: No acute intracranial findings are seen. There are no signs of bleeding. Cortical sulci are prominent. Vascular: Unremarkable. Skull: No fracture is seen in the calvarium. Sinuses/Orbits: Unremarkable. Other: None. IMPRESSION: No acute intracranial findings are seen in noncontrast CT brain. Atrophy. Electronically Signed   By: Elmer Picker M.D.   On: 12/11/2021 17:47     The results of significant diagnostics from this hospitalization (including imaging, microbiology, ancillary and laboratory) are listed below for reference.     Microbiology: No results found for this or any previous visit (from the past 240 hour(s)).   Labs: BNP (last 3 results) Recent Labs    12/29/21 2102  BNP 97.0   Basic Metabolic Panel: Recent Labs  Lab 12/29/21 2102 12/29/21 2129 12/30/21 0518 12/31/21 0217 01/01/22 0532 01/02/22 0309  NA 141 141 138 139 139 142  K  4.5 4.3 4.0 3.4* 3.4* 3.6  CL 103  --  101 107 106 110  CO2 18*  --  '22 23 23 25  '$ GLUCOSE 139*  --  112* 106* 103* 104*  BUN 34*  --  30* '13 9 9  '$ CREATININE 1.44*  --  0.96 0.71 0.78 0.69  CALCIUM 9.6  --  8.8* 8.2* 8.4* 8.5*  MG  --   --  2.4  2.5* 1.8 2.1 2.2  PHOS  --   --  4.0  --  3.3  --    Liver Function Tests: Recent Labs  Lab 12/29/21 2102 12/30/21 0518 12/31/21 0217 01/01/22 0532 01/02/22 0309  AST 79* 67* 61* 39 35  ALT 40 35 38 33 33  ALKPHOS 58 47 40 40 40  BILITOT 2.2* 1.4* 1.1 1.1 0.6  PROT 7.2 6.2* 5.2* 5.1* 5.4*  ALBUMIN 4.4 3.7 3.3* 2.8* 2.8*   No results for input(s): "LIPASE", "AMYLASE" in the last 168 hours. Recent Labs  Lab 12/27/21 0302 12/29/21 2302 12/31/21 2022 01/02/22 0309  AMMONIA 60* 17 106* 56*   CBC: Recent Labs  Lab 12/29/21 2102 12/29/21 2129 12/30/21 0518 12/31/21 0217 01/01/22 0532 01/02/22 0309  WBC 19.5*  --  15.2* 5.6 3.6* 2.9*  NEUTROABS 15.9*  --  11.1*  --   --   --   HGB 16.2 15.6 13.9 11.7* 11.3* 11.1*  HCT 45.8 46.0 39.8 33.4*  31.7* 32.8*  MCV 93.1  --  93.9 93.6 93.8 95.9  PLT 125*  --  105* 71* 76* 84*   Cardiac Enzymes: Recent Labs  Lab 12/29/21 2102 12/30/21 0518 12/31/21 0217 01/01/22 0532  CKTOTAL 1,792* 1,696* 1,025* 411*   BNP: Invalid input(s): "POCBNP" CBG: No results for input(s): "GLUCAP" in the last 168 hours. D-Dimer No results for input(s): "DDIMER" in the last 72 hours. Hgb A1c No results for input(s): "HGBA1C" in the last 72 hours. Lipid Profile No results for input(s): "CHOL", "HDL", "LDLCALC", "TRIG", "CHOLHDL", "LDLDIRECT" in the last 72 hours. Thyroid function studies No results for input(s): "TSH", "T4TOTAL", "T3FREE", "THYROIDAB" in the last 72 hours.  Invalid input(s): "FREET3" Anemia work up No results for input(s): "VITAMINB12", "FOLATE", "FERRITIN", "TIBC", "IRON", "RETICCTPCT" in the last 72 hours. Urinalysis    Component Value Date/Time   COLORURINE AMBER (A) 12/29/2021 2221   APPEARANCEUR CLOUDY (A) 12/29/2021 2221   APPEARANCEUR Clear 06/08/2013 1510   LABSPEC 1.026 12/29/2021 2221   LABSPEC 1.012 06/08/2013 1510   PHURINE 5.0 12/29/2021 2221   GLUCOSEU NEGATIVE 12/29/2021 2221   GLUCOSEU Negative 06/08/2013 1510   HGBUR NEGATIVE 12/29/2021 2221   BILIRUBINUR NEGATIVE 12/29/2021 2221   BILIRUBINUR small (A) 03/09/2017 1002   BILIRUBINUR Negative 06/08/2013 1510   KETONESUR 20 (A) 12/29/2021 2221   PROTEINUR 100 (A) 12/29/2021 2221   UROBILINOGEN 1.0 03/09/2017 1002   UROBILINOGEN 0.2 04/20/2014 2214   NITRITE NEGATIVE 12/29/2021 2221   LEUKOCYTESUR NEGATIVE 12/29/2021 2221   LEUKOCYTESUR Negative 06/08/2013 1510   Sepsis Labs Recent Labs  Lab 12/30/21 0518 12/31/21 0217 01/01/22 0532 01/02/22 0309  WBC 15.2* 5.6 3.6* 2.9*   Microbiology No results found for this or any previous visit (from the past 240 hour(s)).   Time coordinating discharge:  I have spent 35 minutes face to face with the patient and on the ward discussing the patients care,  assessment, plan and disposition with other care givers. >50% of the time was devoted counseling the patient about the risks and benefits  of treatment/Discharge disposition and coordinating care.   SIGNED:   Damita Lack, MD  [facility] 01/02/2022, 1:21 PM   If 7PM-7AM, please contact night-coverage

## 2022-01-02 NOTE — TOC Transition Note (Addendum)
Transition of Care [facility]) - CM/SW Discharge Note   Patient Details  Name: [name] MRN: [id number] Date of Birth: 09-06-1955  Transition of [facility]) CM/SW Contact:  Verdell Carmine, RN Phone Number: 01/02/2022, 1:47 PM   Clinical Narrative:     Patient discharging today, has DME from [facility]. Confirmed with [facility] for home health. PT OT aide and Social work.  Spoke to sister Claiborne Billings regarding transportation from hospital. Sister very concerned about patient going home. She will bring to sisters house.Patients house "unlivable". [facility] notified of dc   Final next level of care: [place] Barriers to Discharge: No Barriers Identified   Patient Goals and CMS Choice Patient states their goals for this hospitalization and ongoing recovery are:: SNF V. [place] CMS Medicare.gov Compare Post Acute Care list provided to:: Patient Choice offered to / list presented to : Patient  Discharge Placement  Home with home health                     Discharge Plan and Services In-house Referral: Clinical Social Work Discharge Planning Services: CM Consult Post Acute Care Choice: [place]          DME Arranged: 3-N-1, Walker rolling DME Agency: [facility] Date DME Agency Contacted: 01/01/22 Time DME Agency Contacted: [phone] Representative spoke with at DME Agency: Jodell Cipro HH Arranged: PT, OT [place] Agency: [place] Date [place]: 01/01/22 Time [place]: [phone] Representative spoke with at [place]: [place] Determinants of Health ([place]) Interventions     Readmission Risk Interventions     No data to display

## 2022-01-02 NOTE — Progress Notes (Signed)
Physical Therapy Treatment Patient Details Name: [name] MRN: [id number] DOB: Dec 14, 1955 Today's Date: 01/02/2022   History of Present Illness [name] is a 66 y.o. male , who is admitted to [facility] on 12/29/2021 with acute kidney injury after presenting to [facility] ED complaining of right shoulder pain after a fall into a ditch, where he stayed for 3 days; R shoulder dislocated, now reduced and sling ordered; noteworthy recent admission with encephalopathy due to depakote toxicity; with medical history significant for COPD, chronic diastolic heart failure, seizure disorder    PT Comments    Pt admitted with above diagnosis. Pt was able to ambulate with RW with min guard to min assist. Will have assist at home and plan is to go home today. Overall pt with good safety.  Pt currently with functional limitations due to balance and endurance deficits. Pt will benefit from skilled PT to increase their independence and safety with mobility to allow discharge to the venue listed below.      Recommendations for follow up therapy are one component of a multi-disciplinary discharge planning process, led by the attending physician.  Recommendations may be updated based on patient status, additional functional criteria and insurance authorization.  Follow Up Recommendations  Home health PT Can patient physically be transported by private vehicle: Yes   Assistance Recommended at Discharge Intermittent Supervision/Assistance  Patient can return home with the following Assist for transportation   Equipment Recommendations  Rolling walker (2 wheels)    Recommendations for Other Services OT consult (as ordered)     Precautions / Restrictions Precautions Precautions: Fall Required Braces or Orthoses: Sling Restrictions Weight Bearing Restrictions: No Other Position/Activity Restrictions: Keep R shoulder immobilized in sling for 1 week (until 7/24), then gentle mobilization of the  shoulder; Per Ortho PA, OK to use RUE with RW     Mobility  Bed Mobility Overal bed mobility: Needs Assistance Bed Mobility: Supine to Sit     Supine to sit: Mod assist     General bed mobility comments: Mod L handheld assist to pull to sit    Transfers Overall transfer level: Needs assistance Equipment used: Rolling walker (2 wheels) Transfers: Sit to/from Stand Sit to Stand: Min assist           General transfer comment: min a for power up and stability due to unsteadiness    Ambulation/Gait Ambulation/Gait assistance: Min guard, Min assist Gait Distance (Feet): 250 Feet Assistive device: Rolling walker (2 wheels) Gait Pattern/deviations: Step-through pattern, Decreased step length - right, Decreased step length - left, Decreased stride length Gait velocity: Decreased Gait velocity interpretation: <1.31 ft/sec, indicative of household ambulator   General Gait Details: Slow pace; occasional need for min assist to turn/manage RW; Noteworthy that pt must stop walking when he is talking   Marine scientist [place] (Stroke Patients Only)       Balance Overall balance assessment: Needs assistance Sitting-balance support: No upper extremity supported, Feet supported Sitting balance-Leahy Scale: Good     Standing balance support: Bilateral upper extremity supported, Single extremity supported, During functional activity Standing balance-Leahy Scale: Poor Standing balance comment: reliant on External and UE support                            Cognition Arousal/Alertness: Awake/alert Behavior During Therapy: WFL for tasks assessed/performed Overall Cognitive  Status: No family/caregiver present to determine baseline cognitive functioning                                 General Comments: A/O x4. Recalling events and following 2 step commands.        Exercises General Exercises - Lower  Extremity Ankle Circles/Pumps: AROM, Both, 10 reps, Supine Long Arc Quad: AROM, Both, 10 reps, Seated    General Comments        Pertinent Vitals/Pain Pain Assessment Pain Assessment: Faces Faces Pain Scale: Hurts little more Breathing: normal Negative Vocalization: none Facial Expression: smiling or inexpressive Body Language: relaxed Consolability: no need to console PAINAD Score: 0 Pain Location: R shoulder with moving Pain Descriptors / Indicators: Grimacing Pain Intervention(s): Limited activity within patient's tolerance, Monitored during session, Repositioned    Home Living                          Prior Function            PT Goals (current goals can now be found in the care plan section) Acute Rehab PT Goals Patient Stated Goal: less R shoulder pain Progress towards PT goals: Progressing toward goals    Frequency    Min 4X/week      PT Plan Current plan remains appropriate    Co-evaluation              AM-PAC PT "6 Clicks" Mobility   Outcome Measure  Help needed turning from your back to your side while in a flat bed without using bedrails?: A Little Help needed moving from lying on your back to sitting on the side of a flat bed without using bedrails?: A Lot Help needed moving to and from a bed to a chair (including a wheelchair)?: A Little Help needed standing up from a chair using your arms (e.g., wheelchair or bedside chair)?: A Little Help needed to walk in hospital room?: A Little Help needed climbing 3-5 steps with a railing? : A Lot 6 Click Score: 16    End of Session Equipment Utilized During Treatment: Gait belt (sling) Activity Tolerance: Patient tolerated treatment well Patient left: in bed;with call bell/phone within reach;with bed alarm set Nurse Communication: Mobility status PT Visit Diagnosis: Unsteadiness on feet (R26.81);Other abnormalities of gait and mobility (R26.89);Pain Pain - Right/Left: Right Pain -  part of body: Shoulder     Time: 1211-1228 PT Time Calculation (min) (ACUTE ONLY): 17 min  Charges:  $Gait Training: 8-22 mins                     Diamonds Lippard M,PT Acute Rehab Services [place] 01/02/2022, 1:53 PM

## 2022-01-02 NOTE — Progress Notes (Addendum)
Brief HPI:  66 year old with a history of COPD, diastolic CHF, seizure disorder comes to the hospital with complaints of right-sided shoulder pain and was found to have an acute kidney injury. On his first admission he had a anterior right shoulder dislocation that was reduced in the ED.  He was discharged on 7/13 in stable condition and then returned on 7/16 after being found in a ditch after 3 days. On this admission he was found to have an acute kidney injury, rhabdomyolysis and had breakthrough seizure activity witnessed by EMS an ED staff.   Subjective: Seen in room, sitting up in bed. He states he is ready to go home and he feels good this morning.  Notes no spells of garbled speech (his typical seizure)  Exam: Current vital signs: BP 126/79 (BP Location: Left Arm)   Pulse 72   Temp 97.9 F (36.6 C) (Oral)   Resp 16   Ht '5\' 8"'$  (1.727 m)   Wt 91.5 kg   SpO2 95%   BMI 30.67 kg/m  Vital signs in last 24 hours: Temp:  [97.9 F (36.6 C)-98.7 F (37.1 C)] 97.9 F (36.6 C) (07/20 0804) Pulse Rate:  [72-85] 72 (07/20 0804) Resp:  [16-18] 16 (07/20 0804) BP: (111-126)/(60-79) 126/79 (07/20 0804) SpO2:  [94 %-97 %] 95 % (07/20 0804)   Gen: In bed, NAD Resp: non-labored breathing, no acute distress Abd: soft, nt  Neuro: MS: Aox4, pleasant, cooperative.  Initially states he thinks he is getting an MRI when attending asks what NP discussed with him, but when specifically asked about medication changes he is able to state he was told to stop the Depakote, cannot remember that plan is to start Vimpat CN: PERRL, EOMI, facial sensation is intact, no facial asymmetry notes, phonation normal, hearing intact, tongue protrudes midline.  Motor: RUE remains in sling, movement is limited by pain, but using the arm more than yesterday and helping to feed himself. Baseline tremor remains asterixis improved from yesterday Sensory: intact to light touch in all extremities  Pertinent Labs: K 3.4 ->  3.6 CK 1025  -> 411 AST improved from 61 to 39 -> 35 Ammonia 106 -> 56 WBC 3.6 -> 2.9  7/19- Depakote level 99 collected 30 min after oral dose  7/20- Depakote level pending  MRI C-Spine - no acute abnormality  Assessment: 66 year old male with a history of seizure disorder. He was trapped in a ditch for 3 days and subsequently had an episode of seizure activity while being extracted from the ditch. He states he was taking his depakote '500mg'$  TID despite it being reduced to BID during his last hospitalization.  Unfortunately with reinitiation of Depakote he did have an increase in his ammonia (which would also be secondary to his cirrhosis), as well as development of leukopenia (some of which again may be due to to cirrhosis as well).  However on review of his white blood cell count trend, I suspect at times he has had a more normal white blood cell count he has been off of his Depakote.  I am concerned that the Depakote may be providing some mood stabilization for this patient but at appropriate levels for seizure control I am worried he is having dangerous side effects.  Additionally, I recommend that his medication changes be reviewed with family given he does seem to have some difficulty retaining new information.  Given his difficulty in remembering medication details, I am hesitant to do a cross taper of medications  and instead will do a loading dose of Vimpat  Impression:  Breakthrough seizure activity due to missed doses of oral AEDs Elevated ammonia more likely secondary to cirrhosis with potential contribution of Depakote Leukopenia potentially due to Depakote (may be some component of cirrhosis as well) Improving transaminitis Current good seizure control  Recommendations: - IV lorazepam 1-4 mg for further seizure activity - Discontinue Depakote, close psychiatric follow-up to consider reinitiation of the lower dose for psychiatric symptoms - Discontinue levocarnitine - EKG  ordered and reviewed, no evidence of heart block - Vimpat 200 mg loading dose followed by 100 mg twice daily - Continue oxcarbazepine 150 mg BID  - Agree with lactulose - Appreciate management of shoulder pain and other comorbidities per primary team, as well as PT/OT - Close outpatient follow-up with neurology and psychiatry - Seizure precautions were discussed with patient and should be included in discharge instructions  Standard seizure precautions: Per [facility] statutes, patients with seizures are not allowed to drive until  they have been seizure-free for six months. Use caution when using heavy equipment or power tools. Avoid working on ladders or at heights. Take showers instead of baths. Ensure the water temperature is not too high on the home water heater. Do not go swimming alone. When caring for infants or small children, sit down when holding, feeding, or changing them to minimize risk of injury to the child in the event you have a seizure.  To reduce risk of seizures, maintain good sleep hygiene avoid alcohol and illicit drug use, take all anti-seizure medications as prescribed.    Patient seen and examined by NP/APP with MD. MD to update note as needed.   Janine Ores, DNP, FNP-BC [facility] Pager: [phone]

## 2022-01-08 ENCOUNTER — Other Ambulatory Visit: Payer: Self-pay

## 2022-01-08 ENCOUNTER — Emergency Department (HOSPITAL_COMMUNITY): Payer: 59

## 2022-01-08 ENCOUNTER — Emergency Department (HOSPITAL_COMMUNITY)
Admission: EM | Admit: 2022-01-08 | Discharge: 2022-01-08 | Disposition: A | Discharge status: Home / Self Care | Payer: 59 | Attending: Emergency Medicine | Admitting: Emergency Medicine

## 2022-01-08 DIAGNOSIS — W010XXA Fall on same level from slipping, tripping and stumbling without subsequent striking against object, initial encounter: Secondary | ICD-10-CM | POA: Diagnosis not present

## 2022-01-08 DIAGNOSIS — Z79899 Other long term (current) drug therapy: Secondary | ICD-10-CM | POA: Insufficient documentation

## 2022-01-08 DIAGNOSIS — E109 Type 1 diabetes mellitus without complications: Secondary | ICD-10-CM | POA: Insufficient documentation

## 2022-01-08 DIAGNOSIS — I110 Hypertensive heart disease with heart failure: Secondary | ICD-10-CM | POA: Insufficient documentation

## 2022-01-08 DIAGNOSIS — S4991XA Unspecified injury of right shoulder and upper arm, initial encounter: Secondary | ICD-10-CM | POA: Diagnosis present

## 2022-01-08 DIAGNOSIS — I509 Heart failure, unspecified: Secondary | ICD-10-CM | POA: Insufficient documentation

## 2022-01-08 DIAGNOSIS — S46911A Strain of unspecified muscle, fascia and tendon at shoulder and upper arm level, right arm, initial encounter: Secondary | ICD-10-CM | POA: Insufficient documentation

## 2022-01-08 DIAGNOSIS — Z7982 Long term (current) use of aspirin: Secondary | ICD-10-CM | POA: Insufficient documentation

## 2022-01-08 DIAGNOSIS — W19XXXA Unspecified fall, initial encounter: Secondary | ICD-10-CM

## 2022-01-08 DIAGNOSIS — R0781 Pleurodynia: Secondary | ICD-10-CM | POA: Diagnosis not present

## 2022-01-08 DIAGNOSIS — R109 Unspecified abdominal pain: Secondary | ICD-10-CM | POA: Insufficient documentation

## 2022-01-08 MED ORDER — HYDROMORPHONE HCL 1 MG/ML IJ SOLN
1.0000 mg | Freq: Once | INTRAMUSCULAR | Status: AC
  Administered 2022-01-08: 1 mg via INTRAMUSCULAR
  Filled 2022-01-08: qty 1

## 2022-01-08 MED ORDER — OXYCODONE-ACETAMINOPHEN 5-325 MG PO TABS: 1.0000 | ORAL_TABLET | Freq: Four times a day (QID) | ORAL | 0 refills | Status: AC | PRN

## 2022-01-08 NOTE — ED Provider Notes (Signed)
[facility] DEPT Provider Note   CSN: [id number] Arrival date & time: 01/08/22  2035     History PMH: Emphysema, hepatitis C, seizures, hypertension, CHF, bipolar type I, history of substance use disorder, hyperlipidemia, MI Chief Complaint  Patient presents with   Shoulder Pain    [name] is a 66 y.o. male. Presents the ED after a fall that occurred last night.  He says he tripped and fell on his right shoulder.  He says that 1 week ago he was hospitalized after falling inside a large ditch and he ended up dislocating his shoulder.  He says he was never given any pain medications upon discharge and has not been able to control his pain at home.  When he fell last night, he said this exacerbated his right shoulder pain, right rib cage pain, and right flank pain.  He did not hit his head or lose consciousness.    Shoulder Pain      Home Medications Prior to Admission medications   Medication Sig Start Date End Date Taking? Authorizing Provider  oxyCODONE-acetaminophen (PERCOCET/ROXICET) 5-325 MG tablet Take 1 tablet by mouth every 6 (six) hours as needed for up to 5 days for severe pain. 01/08/22 01/13/22 Yes Mechel Schutter, Adora Fridge, PA-C  albuterol (PROVENTIL) (2.5 MG/3ML) 0.083% nebulizer solution Take 3 mLs (2.5 mg total) by nebulization every 6 (six) hours as needed for wheezing or shortness of breath. 07/18/20   Lorrene Reid, PA-C  albuterol (VENTOLIN HFA) 108 (90 Base) MCG/ACT inhaler INHALE 2 PUFFS INTO THE LUNGS EVERY 6 HOURS AS NEEDED FOR WHEEZING OR SHORTNESS OF BREATH. Patient taking differently: Inhale 2 puffs into the lungs every 6 (six) hours as needed for wheezing or shortness of breath. 09/04/21   Lorrene Reid, PA-C  aspirin EC 81 MG tablet Take 1 tablet (81 mg total) by mouth daily. Swallow whole. 12/27/21 12/27/22  Lavina Hamman, MD  atorvastatin (LIPITOR) 20 MG tablet TAKE 1 TABLET (20 MG TOTAL) BY MOUTH AT BEDTIME. 12/25/21   Ronnell Freshwater, NP  budesonide-formoterol (SYMBICORT) 80-4.5 MCG/ACT inhaler Inhale 2 puffs into the lungs 2 (two) times daily. Patient not taking: Reported on 12/30/2021 10/06/19   Mellody Dance, DO  busPIRone (BUSPAR) 15 MG tablet Take 15 mg by mouth 3 (three) times daily.    [provider]  folic acid (FOLVITE) 1 MG tablet Take 1 tablet (1 mg total) by mouth daily. 12/27/21   Lavina Hamman, MD  hydrOXYzine (ATARAX/VISTARIL) 10 MG tablet TAKE 1 TABLET BY MOUTH 3 TIMES DAILY AS NEEDED. Patient taking differently: Take 10 mg by mouth 3 (three) times daily as needed for anxiety. 10/14/19   Opalski, Neoma Laming, DO  Lacosamide (VIMPAT) 100 MG TABS Take 1 tablet (100 mg total) by mouth 2 (two) times daily. 01/02/22   Amin, Jeanella Flattery, MD  lactulose (CHRONULAC) 10 GM/15ML solution Take 30 mLs (20 g total) by mouth 2 (two) times daily. Hold after 2 loose BM every day 12/27/21   Lavina Hamman, MD  lisinopril (ZESTRIL) 5 MG tablet Take 5 mg by mouth daily.    [provider]  metoprolol succinate (TOPROL-XL) 25 MG 24 hr tablet Take 0.5 tablets (12.5 mg total) by mouth daily. 01/01/22   Amin, Ankit Chirag, MD  NITROSTAT 0.4 MG SL tablet PLACE 1 TABLET UNDER THE TONGUE EVERY 5 MINUTES AS NEEDED FOR CHEST PAIN FOR UP TO 3 DOSES. Patient not taking: Reported on 12/30/2021 03/26/20   Lorrene Reid,  PA-C  OXcarbazepine (TRILEPTAL) 150 MG tablet Take 150 mg by mouth 2 (two) times daily.    [provider]  oxyCODONE (OXY IR/ROXICODONE) 5 MG immediate release tablet Take 1 tablet (5 mg total) by mouth every 4 (four) hours as needed for moderate pain or severe pain. 01/01/22   Amin, Jeanella Flattery, MD  QUEtiapine (SEROQUEL XR) 400 MG 24 hr tablet Take 400 mg by mouth at bedtime. 07/27/18   [provider]  senna-docusate (SENOKOT-S) 8.6-50 MG tablet Take 1 tablet by mouth at bedtime as needed for moderate constipation. 01/01/22   Amin, Jeanella Flattery, MD  sertraline (ZOLOFT) 100 MG tablet  Take 200 mg by mouth daily.    [provider]  Suvorexant (BELSOMRA) 15 MG TABS Take 1 tablet by mouth at bedtime as needed. Patient taking differently: Take 1 tablet by mouth at bedtime as needed (sleep). 12/19/21   Ronnell Freshwater, NP  tamsulosin (FLOMAX) 0.4 MG CAPS capsule TAKE 2 CAPSULES BY MOUTH DAILY. Patient taking differently: Take 0.8 mg by mouth daily. 11/20/21   Lorrene Reid, PA-C  thiamine 100 MG tablet Take 1 tablet (100 mg total) by mouth daily. 12/27/21   Lavina Hamman, MD  traZODone (DESYREL) 100 MG tablet Take 100 mg by mouth at bedtime as needed for sleep. 12/04/21   [provider]      Allergies    Patient has no known allergies.    Review of Systems   Review of Systems  Eyes:  Negative for visual disturbance.  Respiratory:  Negative for shortness of breath.   Cardiovascular:  Negative for chest pain.  Gastrointestinal:  Negative for abdominal pain, nausea and vomiting.  Genitourinary:  Positive for flank pain.  Musculoskeletal:  Positive for arthralgias.  Neurological:  Negative for dizziness, tremors, seizures, syncope, facial asymmetry, speech difficulty, weakness, light-headedness, numbness and headaches.  All other systems reviewed and are negative.   Physical Exam Updated Vital Signs BP 112/77   Pulse (!) 59   Temp (!) 97.5 F (36.4 C) (Oral)   Resp 18   SpO2 96%  Physical Exam Vitals and nursing note reviewed.  Constitutional:      General: He is not in acute distress.    Appearance: Normal appearance. He is well-developed. He is not ill-appearing, toxic-appearing or diaphoretic.  HENT:     Head: Normocephalic and atraumatic.     Nose: No nasal deformity.     Mouth/Throat:     Lips: Pink. No lesions.  Eyes:     General: Gaze aligned appropriately. No scleral icterus.       Right eye: No discharge.        Left eye: No discharge.     Conjunctiva/sclera: Conjunctivae normal.     Right eye: Right conjunctiva is not injected.  No exudate or hemorrhage.    Left eye: Left conjunctiva is not injected. No exudate or hemorrhage. Pulmonary:     Effort: Pulmonary effort is normal. No respiratory distress.  Abdominal:     General: Abdomen is flat. There is no distension.     Palpations: Abdomen is soft.     Tenderness: There is no abdominal tenderness. There is right CVA tenderness. There is no guarding.  Musculoskeletal:     Comments: Patient is in a right shoulder sling.  He does have bony tenderness to palpation of the right shoulder as well as the right elbow and forearm.  He does have 2+ radial pulses and intact sensation bilaterally.  He  has reproducible right lateral rib cage pain. No c, t, or l spine midline tenderness  Skin:    General: Skin is warm and dry.  Neurological:     Mental Status: He is alert and oriented to person, place, and time.  Psychiatric:        Mood and Affect: Mood normal.        Speech: Speech normal.        Behavior: Behavior normal. Behavior is cooperative.     ED Results / Procedures / Treatments   Labs (all labs ordered are listed, but only abnormal results are displayed) Labs Reviewed - No data to display  EKG None  Radiology DG Ribs Unilateral W/Chest Right  Result Date: 01/08/2022 CLINICAL DATA:  Right anterior rib pain after a fall yesterday. EXAM: RIGHT RIBS AND CHEST - 3+ VIEW COMPARISON:  CT chest 12/29/2021 FINDINGS: Shallow inspiration. Heart size and pulmonary vascularity are normal. Lungs are clear. No pleural effusions. No pneumothorax. Mediastinal contours appear intact. Calcification of the aorta. Right ribs appear intact. No acute displaced fractures are identified. No focal bone lesion or bone destruction. Soft tissues are unremarkable. IMPRESSION: 1. No evidence of active pulmonary disease. 2. Negative right ribs. Electronically Signed   By: Lucienne Capers M.D.   On: 01/08/2022 22:34   DG Wrist Complete Right  Result Date: 01/08/2022 CLINICAL DATA:  Pain  after a fall yesterday. EXAM: RIGHT WRIST - COMPLETE 3+ VIEW COMPARISON:  None Available. FINDINGS: Degenerative changes in the radiocarpal and STT joints. No evidence of acute fracture or dislocation. No focal bone lesion or bone destruction. Soft tissues are unremarkable. IMPRESSION: Mild degenerative changes.  No acute bony abnormalities. Electronically Signed   By: Lucienne Capers M.D.   On: 01/08/2022 22:33   DG Elbow 2 Views Right  Result Date: 01/08/2022 CLINICAL DATA:  Pain after a fall yesterday. EXAM: RIGHT ELBOW - 2 VIEW COMPARISON:  None Available. FINDINGS: Right elbow appears intact. No evidence of acute fracture or dislocation. No focal bone lesion or bone destruction. Joint spaces are normal. No significant effusion. Soft tissue swelling over the proximal ulna. No radiopaque soft tissue foreign bodies. IMPRESSION: Soft tissue swelling.  No acute bony abnormalities. Electronically Signed   By: Lucienne Capers M.D.   On: 01/08/2022 22:32   DG Shoulder Right  Result Date: 01/08/2022 CLINICAL DATA:  Pain after a fall yesterday. EXAM: RIGHT SHOULDER - 2+ VIEW COMPARISON:  12/30/2021 FINDINGS: Degenerative changes in the glenohumeral and acromioclavicular joints. No evidence of acute fracture or dislocation. No focal bone lesion or bone destruction. Soft tissues are unremarkable. IMPRESSION: Mild degenerative changes.  No acute displaced fractures identified. Electronically Signed   By: Lucienne Capers M.D.   On: 01/08/2022 22:31    Procedures Procedures   Medications Ordered in ED Medications  HYDROmorphone (DILAUDID) injection 1 mg (1 mg Intramuscular Given 01/08/22 2225)    ED Course/ Medical Decision Making/ A&P                           Medical Decision Making Amount and/or Complexity of Data Reviewed Radiology: ordered.  Risk Prescription drug management.   Presents after mechanical fall last night where he reinjured his right shoulder, right ribs.  He is 1 week status  post a major injury where he dislocated his right shoulder.  He is neurovascularly intact on exam.  I have repeated his shoulder x-ray which does not show any fracture or  dislocation.  No rib fracture noted on x-ray.  No other abnormality to the right elbow or right wrist noted. Pain improved after medication here in the ED.  Patient did not pick up his pain medication after his discharge, so we will resend this today.  He has orthopedic appointment next week.  He should follow-up with them.  Final Clinical Impression(s) / ED Diagnoses Final diagnoses:  Strain of right shoulder, initial encounter  Rib pain  Fall, initial encounter    Rx / DC Orders ED Discharge Orders          Ordered    oxyCODONE-acetaminophen (PERCOCET/ROXICET) 5-325 MG tablet  Every 6 hours PRN        01/08/22 2254              Adolphus Birchwood, PA-C 01/08/22 [place], [place], DO 01/08/22 2301

## 2022-01-08 NOTE — ED Triage Notes (Signed)
Patient is A&Ox4 ambulatory with a cane. He's from home. He fell yesterday no LOC. Since then he has had R shoulder and R rib pain. He dislocated his shoulder a week ago.    116/ 78- 72- 97 RA

## 2022-01-08 NOTE — Discharge Instructions (Signed)
I have prescribed you Percocet to use as needed for pain. Please only take this as prescribed. Do not take alongside any other narcotic medication. Do not take prior to driving or operating heavy machinery.   Please follow up with Orthopedics next week for your appointment.

## 2022-01-12 DIAGNOSIS — R4182 Altered mental status, unspecified: Secondary | ICD-10-CM | POA: Insufficient documentation
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
## 2022-01-13 ENCOUNTER — Telehealth: Payer: Self-pay | Admitting: Adult Health

## 2022-01-13 ENCOUNTER — Inpatient Hospital Stay: Payer: 59 | Admitting: Adult Health

## 2022-01-13 ENCOUNTER — Other Ambulatory Visit: Payer: Self-pay | Admitting: Physician Assistant

## 2022-01-13 NOTE — Telephone Encounter (Signed)
Pt cancelled appt due to no one to bring to appt.

## 2022-01-13 NOTE — Telephone Encounter (Signed)
Pt said Divalproex is not working. Still having seizures every other day. Would like a call from the nurse.
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
## 2022-01-14 ENCOUNTER — Ambulatory Visit: Payer: 59 | Admitting: Orthopaedic Surgery

## 2022-01-14 ENCOUNTER — Other Ambulatory Visit: Payer: Self-pay | Admitting: Physician Assistant

## 2022-01-14 DIAGNOSIS — Z79899 Other long term (current) drug therapy: Secondary | ICD-10-CM

## 2022-01-14 NOTE — Telephone Encounter (Signed)
The patient called this morning and provided his medication list over the phone. He confirmed that he is taking Lamotrigine 100 mg BID, Oxcarbazapine 150 mg BID, and Divalproex 500 mg TID. I told him I would relay this to the provider. Patient stated he didn't know how to use his nebulizer and he couldn't read the label of one of his inhalers (?steroid one). He stated he was told he had beginning signs of Glaucoma. He also added that in the past after his MI he was placed on baby aspirin but then taken off. Now he has been placed back on it. He confirmed he no longer has any oxycodone left and is not taking it (states he got this for his recent shoulder injury). He would welcome a nurse visit for medication review.

## 2022-01-14 NOTE — Telephone Encounter (Signed)
I reviewed the hospital note they did recommend that he stop Depakote and start Vimpat.  We can order home health nurse to go out but he really needs an appointment with our office or get established with a neurology office closer to him?  If he needs transportation you can talk to check in as we can sometimes provide that to the patient through home health.  Does he have anyone to help him with his medication?  If we are able to get him in the office it would be helpful if he brought someone with him to oversee his medication
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
## 2022-01-16 NOTE — Telephone Encounter (Signed)
8/2 4:53 pm LVM for patient to call back during office hours

## 2022-01-16 NOTE — Progress Notes (Deleted)
Established patient visit   Patient: [name]   DOB: January 29, 1956   66 y.o. Male  MRN: [id number] Visit Date: 01/17/2022   No chief complaint on file.  Subjective    HPI  Routine follow up  -seizures -has had two hospital admissions since visit with me 12/24/2021 due to seizure, tremor.  -third visit to emergency department on 01/08/2022 due to fall.  -was to have initial visit with neurology on 01/13/2022. He did not show for the visit. Did have transportation problems. He has been in touch with the provider to reschedule this appointment. This visit is very important, as seizure disorder and confusion regarding medication is contributing to falls and frequent hospitalizations.     Medications: Outpatient Medications Prior to Visit  Medication Sig   albuterol (PROVENTIL) (2.5 MG/3ML) 0.083% nebulizer solution Take 3 mLs (2.5 mg total) by nebulization every 6 (six) hours as needed for wheezing or shortness of breath.   albuterol (VENTOLIN HFA) 108 (90 Base) MCG/ACT inhaler INHALE 2 PUFFS INTO THE LUNGS EVERY 6 HOURS AS NEEDED FOR WHEEZING OR SHORTNESS OF BREATH. (Patient taking differently: Inhale 2 puffs into the lungs every 6 (six) hours as needed for wheezing or shortness of breath.)   aspirin EC 81 MG tablet Take 1 tablet (81 mg total) by mouth daily. Swallow whole.   atorvastatin (LIPITOR) 20 MG tablet TAKE 1 TABLET (20 MG TOTAL) BY MOUTH AT BEDTIME.   budesonide-formoterol (SYMBICORT) 80-4.5 MCG/ACT inhaler Inhale 2 puffs into the lungs 2 (two) times daily. (Patient not taking: Reported on 12/30/2021)   busPIRone (BUSPAR) 15 MG tablet Take 15 mg by mouth 3 (three) times daily.   divalproex (DEPAKOTE) 500 MG DR tablet Take 500 mg by mouth 3 (three) times daily.   Docusate Calcium (STOOL SOFTENER PO) Take 5 tablets by mouth every other day.   folic acid (FOLVITE) 1 MG tablet Take 1 tablet (1 mg total) by mouth daily.   hydrOXYzine (ATARAX/VISTARIL) 10 MG tablet TAKE 1 TABLET BY  MOUTH 3 TIMES DAILY AS NEEDED. (Patient not taking: Reported on 01/14/2022)   Lacosamide (VIMPAT) 100 MG TABS Take 1 tablet (100 mg total) by mouth 2 (two) times daily.   lactulose (CHRONULAC) 10 GM/15ML solution Take 30 mLs (20 g total) by mouth 2 (two) times daily. Hold after 2 loose BM every day (Patient not taking: Reported on 01/14/2022)   lisinopril (ZESTRIL) 5 MG tablet TAKE 1 TABLET BY MOUTH DAILY.   Melatonin 10 MG TABS Take 80 mg by mouth at bedtime.   metoprolol succinate (TOPROL-XL) 25 MG 24 hr tablet Take 0.5 tablets (12.5 mg total) by mouth daily.   nitroGLYCERIN (NITROSTAT) 0.4 MG SL tablet DISSOLVE 1 TABLET UNDER THE TONGUE EVERY 5 MINUTES FOR UP TO 3 DOSES AS NEEDED FOR CHEST PAIN. IF NO RELIEF AFTER 3 DOSES, CALL 911 OR GO TO ER.   OXcarbazepine (TRILEPTAL) 150 MG tablet Take 150 mg by mouth 2 (two) times daily.   oxyCODONE (OXY IR/ROXICODONE) 5 MG immediate release tablet Take 1 tablet (5 mg total) by mouth every 4 (four) hours as needed for moderate pain or severe pain. (Patient not taking: Reported on 01/14/2022)   QUEtiapine (SEROQUEL XR) 400 MG 24 hr tablet Take 400 mg by mouth at bedtime.   senna-docusate (SENOKOT-S) 8.6-50 MG tablet Take 1 tablet by mouth at bedtime as needed for moderate constipation. (Patient not taking: Reported on 01/14/2022)   sertraline (ZOLOFT) 100 MG tablet Take 200 mg by mouth in the  morning.   Suvorexant (BELSOMRA) 15 MG TABS Take 1 tablet by mouth at bedtime as needed. (Patient taking differently: Take 1 tablet by mouth at bedtime as needed (sleep).)   tamsulosin (FLOMAX) 0.4 MG CAPS capsule TAKE 2 CAPSULES BY MOUTH DAILY. (Patient taking differently: Take 0.8 mg by mouth daily.)   thiamine 100 MG tablet Take 1 tablet (100 mg total) by mouth daily.   traZODone (DESYREL) 100 MG tablet Take 100 mg by mouth at bedtime as needed for sleep.   No facility-administered medications prior to visit.    Review of Systems  {Labs (Optional):23779}   Objective     There were no vitals taken for this visit. BP Readings from Last 3 Encounters:  01/08/22 112/77  01/02/22 126/79  12/27/21 108/74    Wt Readings from Last 3 Encounters:  12/31/21 201 lb 11.5 oz (91.5 kg)  12/25/21 208 lb 15.9 oz (94.8 kg)  12/24/21 209 lb 1.9 oz (94.9 kg)    Physical Exam  ***  No results found for any visits on 01/17/22.  Assessment & Plan     Problem List Items Addressed This Visit   None    No follow-ups on file.         Ronnell Freshwater, NP  [facility] [phone] (phone) [phone] (fax)  [place]

## 2022-01-17 ENCOUNTER — Ambulatory Visit: Payer: 59 | Admitting: Nurse Practitioner

## 2022-01-20 NOTE — Telephone Encounter (Addendum)
The patient returned my call. He states he has a neighbor who helps him organize his medications into a weekly pill organizer most every week and sometimes he will do it himself. He states his psychiatrist made changes to his meds last week but he didn't catch all of them over the phone so he will have his neighbor call and get an update. I asked him if he would see if his psychiatrist would fax over an updated med list to our office as well. The patient said his neighbor is unable to help with transportation because she has young children at home, but he will call [employer] to see if they can provide him with transportation to next week's appt (8/16 @ 9 am arrival 830 am). Pt provided update that he still has not had any seizures. He is still taking his seizure medications. I asked him to bring all of his medications to his visit next week. His questions were answered and he verbalized appreciation.

## 2022-01-20 NOTE — Telephone Encounter (Signed)
Noted! Thank you

## 2022-01-28 ENCOUNTER — Telehealth: Payer: Self-pay | Admitting: *Deleted

## 2022-01-28 NOTE — Telephone Encounter (Addendum)
Spoke to patient cancelled appointment for tomorrow with Megan,NP Rescheduled for 02/10/2022 Pt is having a hard time getting a ride to his appointments and refused mychart visit . Pt wanted me to make Megan,NP aware he is taking Depakote  3x daily  and has not had a seizure. Will forward to Megan,NP to make her aware

## 2022-01-29 ENCOUNTER — Inpatient Hospital Stay: Payer: 59 | Admitting: Adult Health

## 2022-01-30 NOTE — Telephone Encounter (Signed)
I can't manage his seizures/medication if he is unwilling to do mychart or unable to come in for visit. In there a neurology office closer to him that would work better for him

## 2022-01-30 NOTE — Telephone Encounter (Signed)
I called pt and had to [facility].  We need to see him via in ofv or VV to manage his sz / medications. If it is better for him to see closer neurologist we are happy to assist.  May call pcp and see if local neuro recommendation.  I have kept the 02-10-2022 appt.  Let us know what we need to do.

## 2022-02-10 ENCOUNTER — Ambulatory Visit: Payer: 59 | Admitting: Adult Health

## 2022-02-18 ENCOUNTER — Other Ambulatory Visit: Payer: Self-pay | Admitting: Physician Assistant

## 2022-02-18 DIAGNOSIS — R351 Nocturia: Secondary | ICD-10-CM

## 2022-03-10 ENCOUNTER — Other Ambulatory Visit: Payer: Self-pay | Admitting: Nurse Practitioner

## 2022-03-10 DIAGNOSIS — E785 Hyperlipidemia, unspecified: Secondary | ICD-10-CM

## 2022-03-12 ENCOUNTER — Ambulatory Visit: Payer: 59 | Admitting: Adult Health

## 2022-03-25 ENCOUNTER — Other Ambulatory Visit: Payer: Self-pay | Admitting: Nurse Practitioner

## 2022-03-25 DIAGNOSIS — E785 Hyperlipidemia, unspecified: Secondary | ICD-10-CM

## 2022-05-20 ENCOUNTER — Other Ambulatory Visit: Payer: Self-pay | Admitting: Nurse Practitioner

## 2022-05-20 DIAGNOSIS — R351 Nocturia: Secondary | ICD-10-CM

## 2022-05-23 ENCOUNTER — Other Ambulatory Visit: Payer: Self-pay | Admitting: Nurse Practitioner

## 2022-05-26 DIAGNOSIS — M25511 Pain in right shoulder: Secondary | ICD-10-CM | POA: Insufficient documentation

## 2022-06-06 ENCOUNTER — Other Ambulatory Visit: Payer: Self-pay | Admitting: Nurse Practitioner

## 2022-06-06 DIAGNOSIS — E785 Hyperlipidemia, unspecified: Secondary | ICD-10-CM

## 2022-06-23 DIAGNOSIS — M75120 Complete rotator cuff tear or rupture of unspecified shoulder, not specified as traumatic: Secondary | ICD-10-CM | POA: Insufficient documentation

## 2022-06-26 ENCOUNTER — Encounter (HOSPITAL_COMMUNITY): Payer: Self-pay

## 2022-06-26 ENCOUNTER — Other Ambulatory Visit: Payer: Self-pay

## 2022-06-26 ENCOUNTER — Emergency Department (HOSPITAL_COMMUNITY): Payer: 59

## 2022-06-26 ENCOUNTER — Emergency Department (HOSPITAL_COMMUNITY)
Admission: EM | Admit: 2022-06-26 | Discharge: 2022-06-26 | Disposition: A | Discharge status: Home / Self Care | Payer: 59 | Attending: Emergency Medicine | Admitting: Emergency Medicine

## 2022-06-26 DIAGNOSIS — I2510 Atherosclerotic heart disease of native coronary artery without angina pectoris: Secondary | ICD-10-CM | POA: Diagnosis not present

## 2022-06-26 DIAGNOSIS — Z79899 Other long term (current) drug therapy: Secondary | ICD-10-CM | POA: Diagnosis not present

## 2022-06-26 DIAGNOSIS — J441 Chronic obstructive pulmonary disease with (acute) exacerbation: Secondary | ICD-10-CM | POA: Insufficient documentation

## 2022-06-26 DIAGNOSIS — R5381 Other malaise: Secondary | ICD-10-CM

## 2022-06-26 DIAGNOSIS — R0789 Other chest pain: Secondary | ICD-10-CM | POA: Diagnosis present

## 2022-06-26 DIAGNOSIS — Z1152 Encounter for screening for COVID-19: Secondary | ICD-10-CM | POA: Insufficient documentation

## 2022-06-26 DIAGNOSIS — Z7982 Long term (current) use of aspirin: Secondary | ICD-10-CM | POA: Insufficient documentation

## 2022-06-26 DIAGNOSIS — I10 Essential (primary) hypertension: Secondary | ICD-10-CM | POA: Insufficient documentation

## 2022-06-26 DIAGNOSIS — R062 Wheezing: Secondary | ICD-10-CM

## 2022-06-26 LAB — BASIC METABOLIC PANEL
Anion gap: 7 (ref 5–15)
BUN: 22 mg/dL (ref 8–23)
CO2: 21 mmol/L — ABNORMAL LOW (ref 22–32)
Calcium: 7.6 mg/dL — ABNORMAL LOW (ref 8.9–10.3)
Chloride: 110 mmol/L (ref 98–111)
Creatinine, Ser: 0.62 mg/dL (ref 0.61–1.24)
GFR, Estimated: >60 mL/min (ref >60)
Glucose, Bld: 108 mg/dL — ABNORMAL HIGH (ref 70–99)
Potassium: 4 mmol/L (ref 3.5–5.1)
Sodium: 138 mmol/L (ref 135–145)

## 2022-06-26 LAB — CBC WITH DIFFERENTIAL/PLATELET
Abs Immature Granulocytes: 0.02 10*3/uL (ref 0.00–0.07)
Basophils Absolute: 0 10*3/uL (ref 0.0–0.1)
Basophils Relative: 1 %
Eosinophils Absolute: 0.1 10*3/uL (ref 0.0–0.5)
Eosinophils Relative: 3 %
HCT: 36.5 % — ABNORMAL LOW (ref 39.0–52.0)
Hemoglobin: 12.3 g/dL — ABNORMAL LOW (ref 13.0–17.0)
Immature Granulocytes: 1 %
Lymphocytes Relative: 31 %
Lymphs Abs: 0.9 10*3/uL (ref 0.7–4.0)
MCH: 34 pg (ref 26.0–34.0)
MCHC: 33.7 g/dL (ref 30.0–36.0)
MCV: 100.8 fL — ABNORMAL HIGH (ref 80.0–100.0)
Monocytes Absolute: 0.3 10*3/uL (ref 0.1–1.0)
Monocytes Relative: 10 %
Neutro Abs: 1.6 10*3/uL — ABNORMAL LOW (ref 1.7–7.7)
Neutrophils Relative %: 54 %
Platelets: 62 10*3/uL — ABNORMAL LOW (ref 150–400)
RBC: 3.62 MIL/uL — ABNORMAL LOW (ref 4.22–5.81)
RDW: 14.5 % (ref 11.5–15.5)
WBC: 2.8 10*3/uL — ABNORMAL LOW (ref 4.0–10.5)
nRBC: 0 % (ref 0.0–0.2)

## 2022-06-26 LAB — PROTIME-INR
INR: 1.1 (ref 0.8–1.2)
Prothrombin Time: 14.3 seconds (ref 11.4–15.2)

## 2022-06-26 LAB — HEPATIC FUNCTION PANEL
ALT: 22 U/L (ref 0–44)
AST: 26 U/L (ref 15–41)
Albumin: 3.2 g/dL — ABNORMAL LOW (ref 3.5–5.0)
Alkaline Phosphatase: 33 U/L — ABNORMAL LOW (ref 38–126)
Bilirubin, Direct: 0.3 mg/dL — ABNORMAL HIGH (ref 0.0–0.2)
Indirect Bilirubin: 0.3 mg/dL (ref 0.3–0.9)
Total Bilirubin: 0.6 mg/dL (ref 0.3–1.2)
Total Protein: 5.1 g/dL — ABNORMAL LOW (ref 6.5–8.1)

## 2022-06-26 LAB — RESP PANEL BY RT-PCR (RSV, FLU A&B, COVID)  RVPGX2
Influenza A by PCR: NEGATIVE
Influenza B by PCR: NEGATIVE
Resp Syncytial Virus by PCR: NEGATIVE
SARS Coronavirus 2 by RT PCR: NEGATIVE

## 2022-06-26 LAB — TROPONIN I (HIGH SENSITIVITY)
Troponin I (High Sensitivity): 3 ng/L (ref <18)
Troponin I (High Sensitivity): 4 ng/L (ref <18)

## 2022-06-26 LAB — MAGNESIUM: Magnesium: 2 mg/dL (ref 1.7–2.4)

## 2022-06-26 MED ORDER — IPRATROPIUM-ALBUTEROL 0.5-2.5 (3) MG/3ML IN SOLN
3.0000 mL | Freq: Once | RESPIRATORY_TRACT | Status: AC
  Administered 2022-06-26: 3 mL via RESPIRATORY_TRACT
  Filled 2022-06-26: qty 3

## 2022-06-26 MED ORDER — LACTATED RINGERS IV BOLUS
1000.0000 mL | Freq: Once | INTRAVENOUS | Status: AC
  Administered 2022-06-26: 1000 mL via INTRAVENOUS

## 2022-06-26 MED ORDER — PREDNISONE 5 MG PO TABS
50.0000 mg | ORAL_TABLET | Freq: Once | ORAL | Status: AC
  Administered 2022-06-26: 50 mg via ORAL
  Filled 2022-06-26: qty 2

## 2022-06-26 MED ORDER — PREDNISONE 50 MG PO TABS: ORAL_TABLET | ORAL | 0 refills | Status: DC

## 2022-06-26 NOTE — ED Notes (Signed)
Lab reports at this time that they did not recieve the patient's labs although they were sent this RN to recollect

## 2022-06-26 NOTE — ED Notes (Signed)
Call bell in reach patient verbalizes understanding on how to use it

## 2022-06-26 NOTE — ED Provider Notes (Signed)
[facility] EMERGENCY DEPARTMENT Provider Note   CSN: [id number] Arrival date & time: 06/26/22  1533     History  Chief Complaint  Patient presents with   Chest Pain    [name] is a 67 y.o. male.  Past medical history significant for hypertension, seizures, CAD, polysubstance use, cirrhosis, bipolar disorder, emphysema, hyperlipidemia, COPD, rhabdomyolysis, HFpEF EF 55 to 60% on 12/26/2021, anxiety.  Presenting to the emergency department for chest pain/pressure and weakness.  Additionally, has a generalized sense of malaise, and has had symptoms for the past 3 to 4 days.  He describes feeling like his whole body is being squeezed by a python, including his chest.  Feels tightness in his chest, has associated cough.  Has a cough at baseline, it is a little bit more.  Is nonproductive.  Has intermittent hot flashes, but no chills or night sweats.  Additionally, had a fall a couple of days ago, he was walking with his cane and all of a sudden felt weak in his legs.  He tumbled into a pile of bushes.  He has noticed a large bruise on his left thigh which is tender to palpation.  Did not hit his head during that fall, has not had any headaches or vision changes.  Due to his malaise, has had decreased oral intake for the past couple of days.  No nausea, vomiting, diarrhea.  With EMS was complaining of chest pressure or pain, was given sublingual nitroglycerin and aspirin without relief of symptoms.  Has been taking a few extra breathing treatments for the past couple of days without relief of his symptoms.   Chest Pain      Home Medications Prior to Admission medications   Medication Sig Start Date End Date Taking? Authorizing Provider  predniSONE (DELTASONE) 50 MG tablet Take once daily with breakfast for 5 days 06/26/22  Yes Phyllis Ginger, MD  albuterol (PROVENTIL) (2.5 MG/3ML) 0.083% nebulizer solution Take 3 mLs (2.5 mg total) by nebulization every 6 (six) hours as  needed for wheezing or shortness of breath. 07/18/20   Lorrene Reid, PA-C  albuterol (VENTOLIN HFA) 108 (90 Base) MCG/ACT inhaler INHALE 2 PUFFS INTO THE LUNGS EVERY 6 HOURS AS NEEDED FOR WHEEZING OR SHORTNESS OF BREATH. 05/26/22   Ronnell Freshwater, NP  aspirin EC 81 MG tablet Take 1 tablet (81 mg total) by mouth daily. Swallow whole. 12/27/21 12/27/22  Lavina Hamman, MD  atorvastatin (LIPITOR) 20 MG tablet TAKE 1 TABLET (20 MG TOTAL) BY MOUTH AT BEDTIME. 06/06/22   Ronnell Freshwater, NP  budesonide-formoterol (SYMBICORT) 80-4.5 MCG/ACT inhaler Inhale 2 puffs into the lungs 2 (two) times daily. Patient not taking: Reported on 12/30/2021 10/06/19   Mellody Dance, DO  busPIRone (BUSPAR) 15 MG tablet Take 15 mg by mouth 3 (three) times daily.    [provider]  divalproex (DEPAKOTE) 500 MG DR tablet Take 500 mg by mouth 3 (three) times daily.    [provider]  Docusate Calcium (STOOL SOFTENER PO) Take 5 tablets by mouth every other day.    [provider]  folic acid (FOLVITE) 1 MG tablet Take 1 tablet (1 mg total) by mouth daily. 12/27/21   Lavina Hamman, MD  hydrOXYzine (ATARAX/VISTARIL) 10 MG tablet TAKE 1 TABLET BY MOUTH 3 TIMES DAILY AS NEEDED. Patient not taking: Reported on 01/14/2022 10/14/19   Mellody Dance, DO  Lacosamide (VIMPAT) 100 MG TABS Take 1 tablet (100 mg total) by mouth 2 (  two) times daily. 01/02/22   Amin, Jeanella Flattery, MD  lactulose (CHRONULAC) 10 GM/15ML solution Take 30 mLs (20 g total) by mouth 2 (two) times daily. Hold after 2 loose BM every day Patient not taking: Reported on 01/14/2022 12/27/21   Lavina Hamman, MD  lisinopril (ZESTRIL) 5 MG tablet TAKE 1 TABLET BY MOUTH DAILY. 01/13/22   Ronnell Freshwater, NP  Melatonin 10 MG TABS Take 80 mg by mouth at bedtime.    [provider]  metoprolol succinate (TOPROL-XL) 25 MG 24 hr tablet Take 0.5 tablets (12.5 mg total) by mouth daily. 01/01/22   Amin, Ankit Chirag, MD  nitroGLYCERIN  (NITROSTAT) 0.4 MG SL tablet DISSOLVE 1 TABLET UNDER THE TONGUE EVERY 5 MINUTES FOR UP TO 3 DOSES AS NEEDED FOR CHEST PAIN. IF NO RELIEF AFTER 3 DOSES, CALL 911 OR GO TO ER. 01/14/22   Ronnell Freshwater, NP  OXcarbazepine (TRILEPTAL) 150 MG tablet Take 150 mg by mouth 2 (two) times daily.    [provider]  oxyCODONE (OXY IR/ROXICODONE) 5 MG immediate release tablet Take 1 tablet (5 mg total) by mouth every 4 (four) hours as needed for moderate pain or severe pain. Patient not taking: Reported on 01/14/2022 01/01/22   Damita Lack, MD  QUEtiapine (SEROQUEL XR) 400 MG 24 hr tablet Take 400 mg by mouth at bedtime. 07/27/18   [provider]  senna-docusate (SENOKOT-S) 8.6-50 MG tablet Take 1 tablet by mouth at bedtime as needed for moderate constipation. Patient not taking: Reported on 01/14/2022 01/01/22   Damita Lack, MD  sertraline (ZOLOFT) 100 MG tablet Take 200 mg by mouth in the morning.    [provider]  Suvorexant (BELSOMRA) 15 MG TABS Take 1 tablet by mouth at bedtime as needed. Patient taking differently: Take 1 tablet by mouth at bedtime as needed (sleep). 12/19/21   Ronnell Freshwater, NP  tamsulosin (FLOMAX) 0.4 MG CAPS capsule TAKE 2 CAPSULES BY MOUTH DAILY. 02/19/22   Ronnell Freshwater, NP  thiamine 100 MG tablet Take 1 tablet (100 mg total) by mouth daily. 12/27/21   Lavina Hamman, MD  traZODone (DESYREL) 100 MG tablet Take 100 mg by mouth at bedtime as needed for sleep. 12/04/21   [provider]      Allergies    Patient has no known allergies.    Review of Systems   Review of Systems  Cardiovascular:  Positive for chest pain.    Physical Exam Updated Vital Signs BP 110/68   Pulse 62   Temp 97.6 F (36.4 C) (Oral)   Resp 18   SpO2 100%  Physical Exam Vitals and nursing note reviewed.  Constitutional:      General: He is not in acute distress.    Appearance: He is well-developed. He is not ill-appearing or diaphoretic.  HENT:      Head: Normocephalic and atraumatic.  Eyes:     Conjunctiva/sclera: Conjunctivae normal.  Cardiovascular:     Rate and Rhythm: Regular rhythm. Bradycardia present.     Pulses:          Radial pulses are 2+ on the right side and 2+ on the left side.     Heart sounds: Normal heart sounds. No murmur heard. Pulmonary:     Effort: Pulmonary effort is normal. No respiratory distress.     Breath sounds: Examination of the right-upper field reveals wheezing. Examination of the left-upper field reveals wheezing. Wheezing present. No rhonchi or rales.  Chest:     Chest wall: No tenderness.  Abdominal:     Palpations: Abdomen is soft. There is mass (firm upper abdomen/epigastric region, non tender).     Tenderness: There is no abdominal tenderness. There is no guarding or rebound.  Musculoskeletal:        General: No swelling.     Cervical back: Neck supple.     Right lower leg: No tenderness. No edema.     Left lower leg: Tenderness present. No edema.     Comments: Left upper lateral thigh hematoma, slightly tender to palpation.  Normal range of motion of the lower extremity, normal sensation, normal distal capillary refill  Skin:    General: Skin is warm and dry.     Capillary Refill: Capillary refill takes less than 2 seconds.  Neurological:     General: No focal deficit present.     Mental Status: He is alert and oriented to person, place, and time.  Psychiatric:        Mood and Affect: Mood normal.     ED Results / Procedures / Treatments   Labs (all labs ordered are listed, but only abnormal results are displayed) Labs Reviewed  BASIC METABOLIC PANEL - Abnormal; Notable for the following components:      Result Value   CO2 21 (*)    Glucose, Bld 108 (*)    Calcium 7.6 (*)    All other components within normal limits  CBC WITH DIFFERENTIAL/PLATELET - Abnormal; Notable for the following components:   WBC 2.8 (*)    RBC 3.62 (*)    Hemoglobin 12.3 (*)    HCT 36.5 (*)    MCV  100.8 (*)    Platelets 62 (*)    Neutro Abs 1.6 (*)    All other components within normal limits  HEPATIC FUNCTION PANEL - Abnormal; Notable for the following components:   Total Protein 5.1 (*)    Albumin 3.2 (*)    Alkaline Phosphatase 33 (*)    Bilirubin, Direct 0.3 (*)    All other components within normal limits  RESP PANEL BY RT-PCR (RSV, FLU A&B, COVID)  [id number]  PROTIME-INR  MAGNESIUM  CBC  TROPONIN I (HIGH SENSITIVITY)  TROPONIN I (HIGH SENSITIVITY)    EKG EKG Interpretation  Date/Time:  Thursday June 26 2022 16:12:23 EST Ventricular Rate:  57 PR Interval:  167 QRS Duration: 103 QT Interval:  442 QTC Calculation: 431 R Axis:   63 Text Interpretation: Sinus rhythm Minimal ST elevation, anterior leads No significant change since last tracing Confirmed by Leanord Asal ([id number]) on 06/26/2022 5:00:43 PM  Radiology DG Pelvis 1-2 Views  Result Date: 06/26/2022 CLINICAL DATA:  Status post fall. EXAM: PELVIS - 1-2 VIEW COMPARISON:  November 29, 2021 FINDINGS: There is no evidence of pelvic fracture or diastasis. No pelvic bone lesions are seen. Mild degenerative changes are seen involving both hips in the form of joint space narrowing, acetabular sclerosis and lateral acetabular bony spurring. IMPRESSION: Mild degenerative changes involving both hips without an acute osseous abnormality. Electronically Signed   By: Virgina Norfolk M.D.   On: 06/26/2022 18:21   DG Femur Min 2 Views Left  Result Date: 06/26/2022 CLINICAL DATA:  Status post fall. EXAM: LEFT FEMUR 2 VIEWS COMPARISON:  None Available. FINDINGS: There is no evidence of fracture or other focal bone lesions. Soft tissues are unremarkable. IMPRESSION: Negative. Electronically Signed   By: Virgina Norfolk M.D.   On: 06/26/2022 18:12  DG Chest 2 View  Result Date: 06/26/2022 CLINICAL DATA:  Chest tightness x4 days. EXAM: CHEST - 2 VIEW COMPARISON:  January 08, 2022 FINDINGS: The heart size and mediastinal contours are  within normal limits. There is mild calcification of the aortic arch. Very mild bilateral infrahilar atelectatic changes are noted. There is no evidence of an acute infiltrate, pleural effusion or pneumothorax. The visualized skeletal structures are unremarkable. IMPRESSION: No active cardiopulmonary disease. Electronically Signed   By: Virgina Norfolk M.D.   On: 06/26/2022 18:12    Procedures Procedures    Medications Ordered in ED Medications  lactated ringers bolus 1,000 mL (0 mLs Intravenous Stopped 06/26/22 1710)  ipratropium-albuterol (DUONEB) 0.5-2.5 (3) MG/3ML nebulizer solution 3 mL (3 mLs Nebulization Given 06/26/22 1610)  predniSONE (DELTASONE) tablet 50 mg (50 mg Oral Given 06/26/22 1859)    ED Course/ Medical Decision Making/ A&P                           Medical Decision Making Problems Addressed: COPD exacerbation [facility]): chronic illness or injury with severe exacerbation, progression, or side effects of treatment Malaise and fatigue: acute illness or injury that poses a threat to life or bodily functions Wheezing: chronic illness or injury with exacerbation, progression, or side effects of treatment  Amount and/or Complexity of Data Reviewed Independent Historian: EMS Labs: ordered. Decision-making details documented in ED Course. Radiology: ordered and independent interpretation performed. Decision-making details documented in ED Course. ECG/medicine tests: ordered and independent interpretation performed. Decision-making details documented in ED Course.  Risk Prescription drug management.   Pt is a 67 y.o. male with pertinent PMHX hypertension, seizures, CAD, polysubstance use, cirrhosis, bipolar disorder, emphysema, hyperlipidemia, COPD, rhabdomyolysis, HFpEF EF 55 to 60% on 12/26/2021, anxiety who presents w/ chest pain/pressure and weakness.  Overall, describing much more myalgias, generalized weakness, generalized malaise.  Feeling tight in his chest, with a little  bit of increased cough.  No significant increased work of breathing or shortness of breath.  Likely viral syndrome, highest on the differential is COVID/flu.  Also on the differential is ACS/MI, he did have a recent fall with a large left lateral leg hematoma, the differential is anemia.  Additionally, he is at decreased oral intake over the past couple of days and considering electrolyte abnormality, AKI, hyperglycemia.  Additionally her consideration is COPD exacerbation or pneumonia.  Because of the age and risk factors of the patient, ACS will be ruled out with troponin x 2. ASA was given. Nitro was given by EMS. Chest XR and EKG performed. EKG: normal EKG, normal sinus rhythm, unchanged from previous tracings. EKG reviewed by myself and the attending.   Unlikely PNA as CXR shows no signs of consolidations or opacities, no leukocytosis, actually has slight leukopenia however this is normal compared to prior, no fever. Unlikely PE as atypical presentation, low risk per [facility], Doubt Aortic Dissection, Pancreatitis, Arrhythmia, Pneumothorax, Endo/Myo/Pericarditis, Shingles, Emergent complications of an Ulcer, Esophageal pathology, or other emergent pathology.  Believe he most likely has a viral infection given all the constellation of symptoms, however he does have wheezing, a little bit of chest tightness, and increased cough.  Believe he has mild COPD exacerbation.  Was given DuoNebs and steroids here with slight improvement.  Will discharge home with 5-day course of prednisone, as he already has all of his inhalers at home for continued use over the next couple of days.  Instructed to follow-up with primary care provider  as soon as possible.  Return to the emergency department if symptoms worsen or other new concerning symptoms develop.  The plan for this patient was discussed with Dr. Maylon Peppers, who voiced agreement and who oversaw evaluation and treatment of this patient.            Final Clinical Impression(s) / ED Diagnoses Final diagnoses:  Malaise and fatigue  Wheezing  COPD exacerbation ([place])    Rx / DC Orders ED Discharge Orders          Ordered    predniSONE (DELTASONE) 50 MG tablet        06/26/22 1902              Phyllis Ginger, MD 06/26/22 [place], [place] K, DO 06/26/22 2242

## 2022-06-26 NOTE — ED Notes (Signed)
Bloodwork resent at this time

## 2022-06-26 NOTE — Discharge Instructions (Signed)
[name]:  Thank you for allowing [place] to take care of you today.  We hope you begin feeling better soon. You were seen today for weakness, decreased appetite, and overall just unwell feeling.  You do not have COVID or flu which is fortunate, I believe you likely have some sort of viral illness which is causing an exacerbation of your COPD making you have more wheezing and coughing.  Please take your breathing treatments at home, additionally you have a prescription for steroids sent to your pharmacy which she can take for the next 5 days as well.  This should help clear things up and help you feel better.  To-Do:  Please follow-up with your primary doctor within the next 2-3 days. It is important that you review any labs or imaging results (if any) that you had today with them. Your preliminary imaging results (if any) are attached. Please return to the Emergency Department or call 911 if you experience chest pain, shortness of breath, severe pain, severe fever, altered mental status, or have any reason to think that you need emergency medical care.  Thank you again.  Hope you feel better soon.  Phyllis Ginger, MD Department of Emergency Medicine

## 2022-06-26 NOTE — ED Triage Notes (Addendum)
Pt BIBGEMS from home, chest pressure that radiates to the back and generalized weakness for two days. Poor PO intake. Pt alert and oriented speaking in full sentences, NAD noted.  Hx angina  1 sublingual nitro given provided minimal relief  324 ASA  20g  LAC  136/74 Hr 57 98% RA CBG 132

## 2022-06-26 NOTE — ED Notes (Signed)
Patient transported to X-ray 

## 2022-06-26 NOTE — ED Notes (Signed)
This RN called and spoke with Thayer Jew from the lab who reports that they never saw any additional labs that were sent previously. This RN sent down a lavender, light and dark green, gold top, and light blue top at the same time. This RN Actor that she will recollect labs again to be sent down.

## 2022-06-26 NOTE — ED Notes (Signed)
Provider at bedside

## 2022-06-27 ENCOUNTER — Other Ambulatory Visit: Payer: Self-pay | Admitting: Nurse Practitioner

## 2022-06-27 DIAGNOSIS — I159 Secondary hypertension, unspecified: Secondary | ICD-10-CM

## 2022-07-03 ENCOUNTER — Encounter: Payer: Self-pay | Admitting: Adult Health

## 2022-07-03 ENCOUNTER — Ambulatory Visit (INDEPENDENT_AMBULATORY_CARE_PROVIDER_SITE_OTHER): Payer: 59 | Admitting: Adult Health

## 2022-07-03 VITALS — BP 99/55 | HR 57 | Ht 68.0 in | Wt 206.4 lb

## 2022-07-03 DIAGNOSIS — G40409 Other generalized epilepsy and epileptic syndromes, not intractable, without status epilepticus: Secondary | ICD-10-CM

## 2022-07-03 DIAGNOSIS — D696 Thrombocytopenia, unspecified: Secondary | ICD-10-CM | POA: Diagnosis not present
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
## 2022-07-04 LAB — CBC WITH DIFFERENTIAL/PLATELET
Basophils Absolute: 0 10*3/uL (ref 0.0–0.2)
Basos: 1 %
EOS (ABSOLUTE): 0.1 10*3/uL (ref 0.0–0.4)
Eos: 3 %
Hematocrit: 40.2 % (ref 37.5–51.0)
Hemoglobin: 14.2 g/dL (ref 13.0–17.7)
Immature Grans (Abs): 0 10*3/uL (ref 0.0–0.1)
Immature Granulocytes: 1 %
Lymphocytes Absolute: 1.5 10*3/uL (ref 0.7–3.1)
Lymphs: 35 %
MCH: 33.6 pg — ABNORMAL HIGH (ref 26.6–33.0)
MCHC: 35.3 g/dL (ref 31.5–35.7)
MCV: 95 fL (ref 79–97)
Monocytes Absolute: 0.5 10*3/uL (ref 0.1–0.9)
Monocytes: 12 %
Neutrophils Absolute: 2.1 10*3/uL (ref 1.4–7.0)
Neutrophils: 48 %
Platelets: 87 10*3/uL — CL (ref 150–450)
RBC: 4.22 x10E6/uL (ref 4.14–5.80)
RDW: 13.4 % (ref 11.6–15.4)
WBC: 4.3 10*3/uL (ref 3.4–10.8)

## 2022-07-04 LAB — COMPREHENSIVE METABOLIC PANEL
ALT: 33 IU/L (ref 0–44)
AST: 18 IU/L (ref 0–40)
Albumin/Globulin Ratio: 2.6 — ABNORMAL HIGH (ref 1.2–2.2)
Albumin: 4.6 g/dL (ref 3.9–4.9)
Alkaline Phosphatase: 87 IU/L (ref 44–121)
BUN/Creatinine Ratio: 26 — ABNORMAL HIGH (ref 10–24)
BUN: 22 mg/dL (ref 8–27)
Bilirubin Total: 0.5 mg/dL (ref 0.0–1.2)
CO2: 27 mmol/L (ref 20–29)
Calcium: 9 mg/dL (ref 8.6–10.2)
Chloride: 101 mmol/L (ref 96–106)
Creatinine, Ser: 0.85 mg/dL (ref 0.76–1.27)
Globulin, Total: 1.8 g/dL (ref 1.5–4.5)
Glucose: 82 mg/dL (ref 70–99)
Potassium: 4.9 mmol/L (ref 3.5–5.2)
Sodium: 143 mmol/L (ref 134–144)
Total Protein: 6.4 g/dL (ref 6.0–8.5)
eGFR: 95 mL/min/{1.73_m2} (ref >59)

## 2022-07-04 LAB — VALPROIC ACID LEVEL: Valproic Acid Lvl: 93 ug/mL (ref 50–100)

## 2022-07-07 ENCOUNTER — Telehealth: Payer: Self-pay | Admitting: *Deleted

## 2022-07-07 NOTE — Addendum Note (Signed)
Addended by: Trudie Buckler on: 07/07/2022 09:25 AM   Modules accepted: Orders

## 2022-07-07 NOTE — Telephone Encounter (Signed)
LVM for patient to call back to review lab work results

## 2022-07-07 NOTE — Telephone Encounter (Signed)
-----  Message from Ward Givens, NP sent at 07/07/2022  9:25 AM EST ----- Please call patient and may need to call the patient's sister as well.  His platelet count is low and this has been the trend in the past however this sample had aggravation that could affect at the level.  So we need to recheck a CBC.  Also explained to the patient that his low platelet level could be due to the Depakote.  Can he verify if he is taking Vimpat?  In the office he was not sure.

## 2022-07-08 ENCOUNTER — Telehealth: Payer: Self-pay | Admitting: *Deleted

## 2022-07-08 NOTE — Telephone Encounter (Signed)
Spoke with patient gave lab results Informed patient per Megan,NP wants to get his CBC rechecked due to low platelet count . Pt states has to call sister to see what day  she can take him to get bloodwork rechecked Pt states either he will go to PCP or come back to [place] to get labwork Pt states will call back tomorrow to make me aware of decision  informed patient if he goes to PCP please have labwork results forward here Pt expressed and thanked me for calling . Pt states not taking Vimpat will make Jinny Blossom ,NP aware

## 2022-07-08 NOTE — Telephone Encounter (Signed)
-----  Message from Ward Givens, NP sent at 07/07/2022  9:25 AM EST ----- Please call patient and may need to call the patient's sister as well.  His platelet count is low and this has been the trend in the past however this sample had aggravation that could affect at the level.  So we need to recheck a CBC.  Also explained to the patient that his low platelet level could be due to the Depakote.  Can he verify if he is taking Vimpat?  In the office he was not sure.

## 2022-07-09 NOTE — Telephone Encounter (Signed)
LVM for pt to call back to see where he is going to get his blood work rechecked

## 2022-07-15 ENCOUNTER — Other Ambulatory Visit: Payer: Self-pay | Admitting: Nurse Practitioner

## 2022-07-16 ENCOUNTER — Telehealth: Payer: Self-pay

## 2022-07-16 NOTE — Telephone Encounter (Signed)
LVM for patient to call back to make [place] aware where he is going to get his labwork rechecked  Did call both sisters on DPR VM are full unable to leave a message

## 2022-07-16 NOTE — Telephone Encounter (Signed)
I reached out to patient twice to schedule a follow up appointment for refills per Katrina secure chat: pt needs an appt please schedule him with [facility]... pharmacy is requesting a refill pt hasn't made an appt as of yet  we already gave him a refill 05/2022 Per Nira Conn Thanks KM once an appt has been made Rx can be refilled   Also I just received a surgical clearance form that he will need to be seen for as well.   Thanks

## 2022-07-16 NOTE — Telephone Encounter (Signed)
Did they ever call back?

## 2022-07-17 ENCOUNTER — Telehealth: Payer: Self-pay | Admitting: *Deleted

## 2022-07-17 NOTE — Telephone Encounter (Signed)
Received clearance request from [facility]  Surgery: right shoulder reverse arthroplasty Anesthesia: choice Date of Surgery: pending  Request sent to [facility] NP for review/completion

## 2022-07-23 NOTE — Telephone Encounter (Signed)
MM NP reviewed clearance form. She made note of no seizures but that patient's platelet count was low. We have called patient to ask for repeat labs but so far he has not confirmed. Depakote could be contributing but wanted to confirm with labwork.   She was unable to Zakk that he is optimized. Form has been faxed back to [facility] for their records. Received a receipt of confirmation.

## 2022-08-14 ENCOUNTER — Other Ambulatory Visit (INDEPENDENT_AMBULATORY_CARE_PROVIDER_SITE_OTHER): Payer: Self-pay

## 2022-08-14 DIAGNOSIS — Z0289 Encounter for other administrative examinations: Secondary | ICD-10-CM

## 2022-08-14 DIAGNOSIS — D696 Thrombocytopenia, unspecified: Secondary | ICD-10-CM

## 2022-08-15 LAB — CBC WITH DIFFERENTIAL/PLATELET
Basophils Absolute: 0 10*3/uL (ref 0.0–0.2)
Basos: 0 %
EOS (ABSOLUTE): 0.1 10*3/uL (ref 0.0–0.4)
Eos: 2 %
Hematocrit: 43.6 % (ref 37.5–51.0)
Hemoglobin: 15.4 g/dL (ref 13.0–17.7)
Immature Grans (Abs): 0 10*3/uL (ref 0.0–0.1)
Immature Granulocytes: 0 %
Lymphocytes Absolute: 1.3 10*3/uL (ref 0.7–3.1)
Lymphs: 25 %
MCH: 33.4 pg — ABNORMAL HIGH (ref 26.6–33.0)
MCHC: 35.3 g/dL (ref 31.5–35.7)
MCV: 95 fL (ref 79–97)
Monocytes Absolute: 0.6 10*3/uL (ref 0.1–0.9)
Monocytes: 12 %
Neutrophils Absolute: 2.9 10*3/uL (ref 1.4–7.0)
Neutrophils: 61 %
Platelets: 84 10*3/uL — CL (ref 150–450)
RBC: 4.61 x10E6/uL (ref 4.14–5.80)
RDW: 12.9 % (ref 11.6–15.4)
WBC: 4.9 10*3/uL (ref 3.4–10.8)

## 2022-08-25 ENCOUNTER — Telehealth: Payer: Self-pay | Admitting: *Deleted

## 2022-08-25 NOTE — Telephone Encounter (Signed)
Spoke with patient gave message from [facility] about Sisters. Per Megan,NP  no changes with  medication and will  continue to monitor Pt expressed understanding and thanked me for calling

## 2022-08-25 NOTE — Telephone Encounter (Signed)
-----   Message from Ward Givens, NP sent at 08/20/2022  3:15 PM EST ----- Discuss with Dr. Jaynee Eagles- can see message in EPIC. Will monitor for now

## 2022-09-01 ENCOUNTER — Other Ambulatory Visit: Payer: Self-pay | Admitting: Nurse Practitioner

## 2022-09-04 ENCOUNTER — Other Ambulatory Visit: Payer: Self-pay | Admitting: Nurse Practitioner

## 2022-09-04 DIAGNOSIS — E785 Hyperlipidemia, unspecified: Secondary | ICD-10-CM

## 2022-09-08 ENCOUNTER — Encounter: Payer: Self-pay | Admitting: Nurse Practitioner

## 2022-09-08 ENCOUNTER — Ambulatory Visit (INDEPENDENT_AMBULATORY_CARE_PROVIDER_SITE_OTHER): Payer: 59 | Admitting: Nurse Practitioner

## 2022-09-08 VITALS — BP 119/76 | HR 94 | Ht 68.0 in | Wt 191.0 lb

## 2022-09-08 DIAGNOSIS — N3281 Overactive bladder: Secondary | ICD-10-CM | POA: Insufficient documentation

## 2022-09-08 DIAGNOSIS — J439 Emphysema, unspecified: Secondary | ICD-10-CM | POA: Diagnosis not present

## 2022-09-08 DIAGNOSIS — I10 Essential (primary) hypertension: Secondary | ICD-10-CM

## 2022-09-08 DIAGNOSIS — F411 Generalized anxiety disorder: Secondary | ICD-10-CM | POA: Diagnosis not present

## 2022-09-08 DIAGNOSIS — R350 Frequency of micturition: Secondary | ICD-10-CM | POA: Insufficient documentation

## 2022-09-08 DIAGNOSIS — Z79899 Other long term (current) drug therapy: Secondary | ICD-10-CM

## 2022-09-08 DIAGNOSIS — Z01818 Encounter for other preprocedural examination: Secondary | ICD-10-CM

## 2022-09-08 DIAGNOSIS — R634 Abnormal weight loss: Secondary | ICD-10-CM | POA: Insufficient documentation

## 2022-09-08 DIAGNOSIS — G40909 Epilepsy, unspecified, not intractable, without status epilepticus: Secondary | ICD-10-CM

## 2022-09-08 MED ORDER — METOPROLOL SUCCINATE ER 25 MG PO TB24: 12.5000 mg | ORAL_TABLET | Freq: Every day | ORAL | 1 refills | Status: DC

## 2022-09-08 MED ORDER — BUDESONIDE-FORMOTEROL FUMARATE 80-4.5 MCG/ACT IN AERO: 2.0000 | INHALATION_SPRAY | Freq: Two times a day (BID) | RESPIRATORY_TRACT | 3 refills | Status: DC

## 2022-09-08 MED ORDER — LISINOPRIL 5 MG PO TABS: 5.0000 mg | ORAL_TABLET | Freq: Every day | ORAL | 1 refills | Status: DC

## 2022-09-08 MED ORDER — HYDROXYZINE HCL 10 MG PO TABS: 10.0000 mg | ORAL_TABLET | Freq: Three times a day (TID) | ORAL | 0 refills | Status: DC | PRN

## 2022-09-08 MED ORDER — ALBUTEROL SULFATE HFA 108 (90 BASE) MCG/ACT IN AERS: 2.0000 | INHALATION_SPRAY | Freq: Four times a day (QID) | RESPIRATORY_TRACT | 3 refills | Status: DC | PRN

## 2022-09-08 MED ORDER — NITROGLYCERIN 0.4 MG SL SUBL
0.4000 mg | SUBLINGUAL_TABLET | SUBLINGUAL | 1 refills | Status: AC | PRN
Start: 2022-09-08 — End: ?

## 2022-09-08 MED ORDER — OXYBUTYNIN CHLORIDE ER 5 MG PO TB24: 5.0000 mg | ORAL_TABLET | Freq: Every day | ORAL | 1 refills | Status: DC

## 2022-09-08 NOTE — Assessment & Plan Note (Signed)
D/C flomax. Trial oxybutynin ER 5 mg daily. Will adjust dosing according to symptoms.

## 2022-09-08 NOTE — Progress Notes (Unsigned)
Established patient visit   Patient: [name]   DOB: [birth year]   67 y.o. Male  MRN: [id number] Visit Date: 09/08/2022   Chief Complaint  Patient presents with   SURGICAL  CLEARANCE   Subjective    HPI  Surgical clearance  --right shoulder replacement  --needs to have clearance from PCP and neurology  -concern for urinary frequency and urgency  -currently taking flomax daily which is not helping.  -decreased appetite.  -has had a 10 pound weight loss (unintentional) since July 2023.  -patient does have COPD -quit smoking a couple of years. States that it has been at least 5 years.  -quit drinking at the same time he quit smoking. It has been over 5 years.   Medications: Outpatient Medications Prior to Visit  Medication Sig   albuterol (PROVENTIL) (2.5 MG/3ML) 0.083% nebulizer solution Take 3 mLs (2.5 mg total) by nebulization every 6 (six) hours as needed for wheezing or shortness of breath.   aspirin EC 81 MG tablet Take 1 tablet (81 mg total) by mouth daily. Swallow whole.   atorvastatin (LIPITOR) 20 MG tablet TAKE 1 TABLET BY MOUTH AT BEDTIME.   busPIRone (BUSPAR) 15 MG tablet Take 15 mg by mouth 3 (three) times daily.   divalproex (DEPAKOTE) 500 MG DR tablet Take 500 mg by mouth 3 (three) times daily.   Docusate Calcium (STOOL SOFTENER PO) Take 5 tablets by mouth every other day.   folic acid (FOLVITE) 1 MG tablet Take 1 tablet (1 mg total) by mouth daily.   Lacosamide (VIMPAT) 100 MG TABS Take 1 tablet (100 mg total) by mouth 2 (two) times daily.   lactulose (CHRONULAC) 10 GM/15ML solution Take 30 mLs (20 g total) by mouth 2 (two) times daily. Hold after 2 loose BM every day   Melatonin 10 MG TABS Take 80 mg by mouth at bedtime.   OXcarbazepine (TRILEPTAL) 150 MG tablet Take 150 mg by mouth 2 (two) times daily.   oxyCODONE (OXY IR/ROXICODONE) 5 MG immediate release tablet Take 1 tablet (5 mg total) by mouth every 4 (four) hours as needed for moderate pain or severe  pain.   QUEtiapine (SEROQUEL XR) 400 MG 24 hr tablet Take 400 mg by mouth at bedtime.   senna-docusate (SENOKOT-S) 8.6-50 MG tablet Take 1 tablet by mouth at bedtime as needed for moderate constipation.   sertraline (ZOLOFT) 100 MG tablet Take 200 mg by mouth in the morning.   Suvorexant (BELSOMRA) 15 MG TABS Take 1 tablet by mouth at bedtime as needed. (Patient taking differently: Take 1 tablet by mouth at bedtime as needed (sleep).)   thiamine 100 MG tablet Take 1 tablet (100 mg total) by mouth daily.   traZODone (DESYREL) 100 MG tablet Take 100 mg by mouth at bedtime as needed for sleep.   [DISCONTINUED] albuterol (VENTOLIN HFA) 108 (90 Base) MCG/ACT inhaler INHALE 2 PUFFS INTO THE LUNGS EVERY 6 HOURS AS NEEDED FOR WHEEZING OR SHORTNESS OF BREATH.   [DISCONTINUED] budesonide-formoterol (SYMBICORT) 80-4.5 MCG/ACT inhaler Inhale 2 puffs into the lungs 2 (two) times daily.   [DISCONTINUED] hydrOXYzine (ATARAX/VISTARIL) 10 MG tablet TAKE 1 TABLET BY MOUTH 3 TIMES DAILY AS NEEDED.   [DISCONTINUED] lisinopril (ZESTRIL) 5 MG tablet TAKE 1 TABLET BY MOUTH DAILY.   [DISCONTINUED] metoprolol succinate (TOPROL-XL) 25 MG 24 hr tablet Take 0.5 tablets (12.5 mg total) by mouth daily.   [DISCONTINUED] nitroGLYCERIN (NITROSTAT) 0.4 MG SL tablet DISSOLVE 1 TABLET UNDER THE TONGUE EVERY 5 MINUTES FOR UP  TO 3 DOSES AS NEEDED FOR CHEST PAIN. IF NO RELIEF AFTER 3 DOSES, CALL 911 OR GO TO ER.   [DISCONTINUED] tamsulosin (FLOMAX) 0.4 MG CAPS capsule TAKE 2 CAPSULES BY MOUTH DAILY.   No facility-administered medications prior to visit.    Review of Systems See HPI      Objective     Today's Vitals   09/08/22 1120  BP: 119/76  Pulse: 94  SpO2: 94%  Weight: 191 lb (86.6 kg)  Height: 5\' 8"  (1.727 m)   Body mass index is 29.04 kg/m.  BP Readings from Last 3 Encounters:  09/08/22 119/76  07/03/22 (Abnormal) 99/55  06/26/22 110/68    Wt Readings from Last 3 Encounters:  09/08/22 191 lb (86.6 kg)   07/03/22 206 lb 6.4 oz (93.6 kg)  12/31/21 201 lb 11.5 oz (91.5 kg)    Physical Exam Vitals and nursing note reviewed.  Constitutional:      Appearance: Normal appearance. He is well-developed.  HENT:     Head: Normocephalic and atraumatic.     Nose: Nose normal.     Mouth/Throat:     Mouth: Mucous membranes are moist.     Pharynx: Oropharynx is clear.  Eyes:     Extraocular Movements: Extraocular movements intact.     Conjunctiva/sclera: Conjunctivae normal.     Pupils: Pupils are equal, round, and reactive to light.  Neck:     Vascular: No carotid bruit.  Cardiovascular:     Rate and Rhythm: Normal rate and regular rhythm.     Pulses: Normal pulses.     Heart sounds: Normal heart sounds.     Comments: Occasional irregular heart beat.  Pulmonary:     Effort: Pulmonary effort is normal.     Breath sounds: Normal breath sounds.     Comments: Mild rhonchi heard in right lower lung.  Abdominal:     Palpations: Abdomen is soft.  Musculoskeletal:        General: Normal range of motion.     Cervical back: Normal range of motion and neck supple.  Lymphadenopathy:     Cervical: No cervical adenopathy.  Skin:    General: Skin is warm and dry.     Capillary Refill: Capillary refill takes less than 2 seconds.  Neurological:     General: No focal deficit present.     Mental Status: He is alert and oriented to person, place, and time. Mental status is at baseline.  Psychiatric:        Mood and Affect: Mood normal.        Behavior: Behavior normal.        Thought Content: Thought content normal.        Judgment: Judgment normal.      Assessment & Plan    Pre-operative general physical examination Assessment & Plan: Presurgical clearance for right hip replacement.  -he is clear for surgery with moderate risk due to history of COPD, CAD, and seizure disorder. Separate surgical clearance to be completed by patient's neurologist.   Renew prescription for nitroglycerin and use  as needed and as prescribed    Pulmonary emphysema, unspecified emphysema type ([place]) Assessment & Plan: Restart Symbicort 80/4.5 mcg inhaler. Use 2 inhalations twice daily. Continue to use rescue inhaler as needed and as prescribed. He also has nebulizer treatments which he can use if needed. Currently, no refills needed.   Orders: -     Albuterol Sulfate HFA; Inhale 2 puffs into the lungs every 6 (six)  hours as needed for wheezing or shortness of breath.  Dispense: 18 g; Refill: 3 -     Budesonide-Formoterol Fumarate; Inhale 2 puffs into the lungs 2 (two) times daily.  Dispense: 1 each; Refill: 3  Essential hypertension Assessment & Plan: Blood pressure stable. Continue medication as prescribed   Orders: -     Lisinopril; Take 1 tablet (5 mg total) by mouth daily.  Dispense: 90 tablet; Refill: 1 -     Metoprolol Succinate ER; Take 0.5 tablets (12.5 mg total) by mouth daily.  Dispense: 45 tablet; Refill: 1  GAD (generalized anxiety disorder) Assessment & Plan: Renew hydroxyzine which may be taken up to three times daily as needed for acute anxiety. He should continue regular visits with psychiatry to manage all other psychiatric regimens.   Orders: -     hydrOXYzine HCl; Take 1 tablet (10 mg total) by mouth 3 (three) times daily as needed.  Dispense: 270 tablet; Refill: 0  Overactive bladder Assessment & Plan: D/C flomax. Trial oxybutynin ER 5 mg daily. Will adjust dosing according to symptoms.   Orders: -     oxyBUTYnin Chloride ER; Take 1 tablet (5 mg total) by mouth at bedtime.  Dispense: 90 tablet; Refill: 1  Unintentional weight loss Assessment & Plan: Unclear etiology. Will have patient back for lab draw for further evaluation.    Medication management Assessment & Plan: Presurgical clearance for right hip replacement.  -he is clear for surgery with moderate risk due to history of COPD, CAD, and seizure disorder. Separate surgical clearance to be completed by patient's  neurologist.   Renew prescription for nitroglycerin and use as needed and as prescribed   Orders: -     Nitroglycerin; Place 1 tablet (0.4 mg total) under the tongue every 5 (five) minutes as needed for chest pain.  Dispense: 25 tablet; Refill: 1  Seizure disorder [facility]) Assessment & Plan: Currently stable. Continue regular visits with neurology as scheduled       Return in about 2 months (around 11/08/2022) for COPD - fasting blood work at time of visit. [name]         Ronnell Freshwater, NP  [facility] [phone] (phone) [phone] (fax)  [place]

## 2022-09-08 NOTE — Assessment & Plan Note (Signed)
Restart Symbicort 80/4.5 mcg inhaler. Use 2 inhalations twice daily. Continue to use rescue inhaler as needed and as prescribed. He also has nebulizer treatments which he can use if needed. Currently, no refills needed.

## 2022-09-08 NOTE — Assessment & Plan Note (Signed)
Renew hydroxyzine which may be taken up to three times daily as needed for acute anxiety. He should continue regular visits with psychiatry to manage all other psychiatric regimens.

## 2022-09-08 NOTE — Assessment & Plan Note (Signed)
Currently stable. Continue regular visits with neurology as scheduled

## 2022-09-08 NOTE — Assessment & Plan Note (Signed)
Blood pressure stable. Continue medication as prescribed  °

## 2022-09-09 NOTE — Assessment & Plan Note (Addendum)
Presurgical clearance for right hip replacement.  -he is clear for surgery with moderate risk due to history of COPD, CAD, and seizure disorder. Separate surgical clearance to be completed by patient's neurologist.   Renew prescription for nitroglycerin and use as needed and as prescribed

## 2022-09-09 NOTE — Assessment & Plan Note (Signed)
Unclear etiology. Will have patient back for lab draw for further evaluation.

## 2022-09-15 NOTE — Telephone Encounter (Signed)
Received another medical clearance form for pt.  Completed this after pt having depakote levels drawn.  Ward Givens, NP consulted with Dr. April Manson.  Pt to take 500mg  depakote in am and noon then take depakote 1500mg  the night before surgery., resume regular dosing after surgery.  This was faxed to [facility] [phone]. Last note and lab results.  Received confirmation.

## 2022-09-21 NOTE — Patient Instructions (Signed)
SURGICAL WAITING ROOM VISITATION Patients having surgery or a procedure may have no more than 2 support people in the waiting area - these visitors may rotate in the visitor waiting room.   Due to an increase in RSV and influenza rates and associated hospitalizations, children ages 67 and under may not visit patients in [facility] hospitals. If the patient needs to stay at the hospital during part of their recovery, the visitor guidelines for inpatient rooms apply.  PRE-OP VISITATION  Pre-op nurse will coordinate an appropriate time for 1 support person to accompany the patient in pre-op.  This support person may not rotate.  This visitor will be contacted when the time is appropriate for the visitor to come back in the pre-op area.  Please refer to the [facility] website for the visitor guidelines for Inpatients (after your surgery is over and you are in a regular room).  You are not required to quarantine at this time prior to your surgery. However, you must do this: Hand Hygiene often Do NOT share personal items Notify your provider if you are in close contact with someone who has COVID or you develop fever 100.4 or greater, new onset of sneezing, cough, sore throat, shortness of breath or body aches.  If you test positive for Covid or have been in contact with anyone that has tested positive in the last 10 days please notify you surgeon.    Your procedure is scheduled on:   Friday  September 26, 2022  Report to [facility] Main Entrance: [place] entrance where the [employer] is available.   Report to admitting at: 07:15 AM  +++++Call this number if you have any questions or problems the morning of surgery [phone]  Do not eat food after Midnight the night prior to your surgery/procedure.  After Midnight you may have the following liquids until   06:45 AM DAY OF SURGERY  Clear Liquid Diet Water Black Coffee (sugar ok, NO MILK/CREAM OR CREAMERS)  Tea (sugar ok, NO  MILK/CREAM OR CREAMERS) regular and decaf                             Plain Jell-O  with no fruit (NO RED)                                           Fruit ices (not with fruit pulp, NO RED)                                     Popsicles (NO RED)                                                                  Juice: apple, WHITE grape, WHITE cranberry Sports drinks like Gatorade or Powerade (NO RED)                    The day of surgery:  Drink ONE (1) Pre-Surgery Clear Ensure at  06:45 AM the morning of surgery. Drink in one sitting. Do  not sip.  This drink was given to you during your hospital pre-op appointment visit. Nothing else to drink after completing the Pre-Surgery Clear Ensure: No candy, chewing gum or throat lozenges.    FOLLOW ANY ADDITIONAL PRE OP INSTRUCTIONS YOU RECEIVED FROM YOUR SURGEON'S OFFICE!!!   Oral Hygiene is also important to reduce your risk of infection.        Remember - BRUSH YOUR TEETH THE MORNING OF SURGERY WITH YOUR REGULAR TOOTHPASTE  Do NOT smoke after Midnight the night before surgery.  Take ONLY these medicines the morning of surgery with A SIP OF WATER: Metoprolol, sertraline (Zoloft), oxcarbazepine (Triliptal), buspirone (Buspar), and Tramadol if needed for pain.   You may use your Symbicort and Albuterol inhalers as needed.    You may not have any metal on your body including  jewelry, and body piercing  Do not wear  lotions, powders,  cologne, or deodorant  Men may shave face and neck.  Contacts, Hearing Aids, dentures or bridgework may not be worn into surgery. DENTURES WILL BE REMOVED PRIOR TO SURGERY PLEASE DO NOT APPLY "Poly grip" OR ADHESIVES!!!  You may bring a small overnight bag with you on the day of surgery, only pack items that are not valuable. [facility] IS NOT RESPONSIBLE   FOR VALUABLES THAT ARE LOST OR STOLEN.   Do not bring your home medications to the hospital. The Pharmacy will dispense medications listed on your  medication list to you during your admission in the Hospital.  Special Instructions: Bring a copy of your healthcare power of attorney and living will documents the day of surgery, if you wish to have them scanned into your  Medical Records- EPIC  Please read over the following fact sheets you were given: IF YOU HAVE QUESTIONS ABOUT YOUR PRE-OP INSTRUCTIONS, PLEASE CALL [phone].   [place] - Preparing for Surgery Before surgery, you can play an important role.  Because skin is not sterile, your skin needs to be as free of germs as possible.  You can reduce the number of germs on your skin by washing with CHG (chlorahexidine gluconate) soap before surgery.  CHG is an antiseptic cleaner which kills germs and bonds with the skin to continue killing germs even after washing. Please DO NOT use if you have an allergy to CHG or antibacterial soaps.  If your skin becomes reddened/irritated stop using the CHG and inform your nurse when you arrive at Short Stay. Do not shave (including legs and underarms) for at least 48 hours prior to the first CHG shower.  You may shave your face/neck.  Please follow these instructions carefully:  1.  Shower with CHG Soap the night before surgery and the  morning of surgery.  2.  If you choose to wash your hair, wash your hair first as usual with your normal  shampoo.  3.  After you shampoo, rinse your hair and body thoroughly to remove the shampoo.                             4.  Use CHG as you would any other liquid soap.  You can apply chg directly to the skin and wash.  Gently with a scrungie or clean washcloth.  5.  Apply the CHG Soap to your body ONLY FROM THE NECK DOWN.   Do not use on face/ open  Wound or open sores. Avoid contact with eyes, ears mouth and genitals (private parts).                       Wash face,  Genitals (private parts) with your normal soap.             6.  Wash thoroughly, paying special attention  to the area where your  surgery  will be performed.  7.  Thoroughly rinse your body with warm water from the neck down.  8.  DO NOT shower/wash with your normal soap after using and rinsing off the CHG Soap.            9.  Pat yourself dry with a clean towel.            10.  Wear clean pajamas.            11.  Place clean sheets on your bed the night of your first shower and do not  sleep with pets.  ON THE DAY OF SURGERY : Do not apply any lotions/deodorants the morning of surgery.  Please wear clean clothes to the hospital/surgery center.    FAILURE TO FOLLOW THESE INSTRUCTIONS MAY RESULT IN THE CANCELLATION OF YOUR SURGERY  PATIENT SIGNATURE_________________________________  NURSE SIGNATURE__________________________________  ________________________________________________________________________     [name]    An incentive spirometer is a tool that can help keep your lungs clear and active. This tool measures how well you are filling your lungs with each breath. Taking long deep breaths may help reverse or decrease the chance of developing breathing (pulmonary) problems (especially infection) following: A long period of time when you are unable to move or be active. BEFORE THE PROCEDURE  If the spirometer includes an indicator to show your best effort, your nurse or respiratory therapist will set it to a desired goal. If possible, sit up straight or lean slightly forward. Try not to slouch. Hold the incentive spirometer in an upright position. INSTRUCTIONS FOR USE  Sit on the edge of your bed if possible, or sit up as far as you can in bed or on a chair. Hold the incentive spirometer in an upright position. Breathe out normally. Place the mouthpiece in your mouth and seal your lips tightly around it. Breathe in slowly and as deeply as possible, raising the piston or the ball toward the top of the column. Hold your breath for 3-5 seconds or for as long as  possible. Allow the piston or ball to fall to the bottom of the column. Remove the mouthpiece from your mouth and breathe out normally. Rest for a few seconds and repeat Steps 1 through 7 at least 10 times every 1-2 hours when you are awake. Take your time and take a few normal breaths between deep breaths. The spirometer may include an indicator to show your best effort. Use the indicator as a goal to work toward during each repetition. After each set of 10 deep breaths, practice coughing to be sure your lungs are clear. If you have an incision (the cut made at the time of surgery), support your incision when coughing by placing a pillow or rolled up towels firmly against it. Once you are able to get out of bed, walk around indoors and cough well. You may stop using the incentive spirometer when instructed by your caregiver.  RISKS AND COMPLICATIONS Take your time so you do not get dizzy or light-headed. If you are in pain,  you may need to take or ask for pain medication before doing incentive spirometry. It is harder to take a deep breath if you are having pain. AFTER USE Rest and breathe slowly and easily. It can be helpful to keep track of a log of your progress. Your caregiver can provide you with a simple table to help with this. If you are using the spirometer at home, follow these instructions: SEEK MEDICAL CARE IF:  You are having difficultly using the spirometer. You have trouble using the spirometer as often as instructed. Your pain medication is not giving enough relief while using the spirometer. You develop fever of 100.5 F (38.1 C) or higher.                                                                                                    SEEK IMMEDIATE MEDICAL CARE IF:  You cough up bloody sputum that had not been present before. You develop fever of 102 F (38.9 C) or greater. You develop worsening pain at or near the incision site. MAKE SURE YOU:  Understand these  instructions. Will watch your condition. Will get help right away if you are not doing well or get worse. Document Released: 10/13/2006 Document Revised: 08/25/2011 Document Reviewed: 12/14/2006 [facility] Patient Information 2014 [place], [place].

## 2022-09-21 NOTE — Progress Notes (Addendum)
COVID Vaccine received:  []  No [x]  Yes Date of any COVID positive Test in last 90 days: None  PCP - Vincent Gros, NP surgical clearance 09-08-22  Epic note Cardiologist - Yates Decamp, MD  Chest x-ray - 06-26-2022 2v Epic EKG -  06-27-2022  Epic Stress Test - 11-17-2018  Epic ECHO - 12-26-2021 Epic Cardiac Cath - 9-2- 2006 DES x 1,  Done at [facility]   PCR screen: [x]  Ordered & Completed           []   No Order but Needs PROFEND           []   N/A for this surgery  Surgery Plan:  []  Ambulatory                            [x]  Outpatient in bed                            []  Admit  Anesthesia:    []  General  []  Spinal                           [x]   Choice []   MAC  Pacemaker / ICD device [x]  No []  Yes   Spinal Cord Stimulator:[x]  No []  Yes       History of Sleep Apnea? [x]  No []  Yes   CPAP used?- [x]  No []  Yes    Does the patient monitor blood sugar?          []  No []  Yes  [x]  N/A  Patient has: [x]  NO Hx DM   []  Pre-DM                 []  DM1  []   DM2  Blood Thinner / Instructions: none Aspirin Instructions:none  ERAS Protocol Ordered: []  No  [x]  Yes PRE-SURGERY [x]  ENSURE  []  G2  Patient is to be NPO after: 06:45 am  Comments: Lives alone and doesn't drive d/t seizure history  Patient was given the 5 CHG shower / bath instructions for THA surgery along with 2 bottles of the CHG soap. Patient will start this on: Tuesday September 23, 2022. (Only 4 CHG are possible d/t timing of his PST appt. Patient voiced understanding of this process.   The patient was given Benzoyl peroxide Gel as ordered. Instruction regarding application starting 2 days prior to surgery was given and patient voiced understanding.   Activity level: Patient is able to climb a flight of stairs without difficulty; [x]  No CP   but would have SOB.   Patient can perform ADLs without assistance.   Anesthesia review: Seizures (last 12-2021), tremors, HTN, COPD,CAD- Hx MI (cath-DESx1) HRpEF, Bipolar 1, HEP B&C, Liver  cirrhosis, Thrombocytopenia (on 08-14-22 ? 84) CVA, substance abuse,   Patient denies shortness of breath, fever, cough and chest pain at PAT appointment.  Patient verbalized understanding and agreement to the Pre-Surgical Instructions that were given to them at this PAT appointment. Patient was also educated of the need to review these PAT instructions again prior to his surgery.I reviewed the appropriate phone numbers to call if they have any and questions or concerns.

## 2022-09-23 ENCOUNTER — Encounter (HOSPITAL_COMMUNITY)
Admission: RE | Admit: 2022-09-23 | Discharge: 2022-09-23 | Disposition: A | Discharge status: Home / Self Care | Payer: 59 | Source: Ambulatory Visit | Attending: Orthopedic Surgery | Admitting: Orthopedic Surgery

## 2022-09-23 ENCOUNTER — Other Ambulatory Visit: Payer: Self-pay

## 2022-09-23 ENCOUNTER — Encounter (HOSPITAL_COMMUNITY): Payer: Self-pay

## 2022-09-23 VITALS — BP 100/65 | HR 50 | Temp 97.9°F | Resp 12 | Ht 68.0 in | Wt 181.0 lb

## 2022-09-23 DIAGNOSIS — K7460 Unspecified cirrhosis of liver: Secondary | ICD-10-CM

## 2022-09-23 DIAGNOSIS — Z01818 Encounter for other preprocedural examination: Secondary | ICD-10-CM

## 2022-09-23 DIAGNOSIS — I5032 Chronic diastolic (congestive) heart failure: Secondary | ICD-10-CM | POA: Diagnosis not present

## 2022-09-23 DIAGNOSIS — I110 Hypertensive heart disease with heart failure: Secondary | ICD-10-CM | POA: Insufficient documentation

## 2022-09-23 DIAGNOSIS — I252 Old myocardial infarction: Secondary | ICD-10-CM | POA: Diagnosis not present

## 2022-09-23 DIAGNOSIS — Z01812 Encounter for preprocedural laboratory examination: Secondary | ICD-10-CM | POA: Insufficient documentation

## 2022-09-23 DIAGNOSIS — I2510 Atherosclerotic heart disease of native coronary artery without angina pectoris: Secondary | ICD-10-CM

## 2022-09-23 DIAGNOSIS — F1910 Other psychoactive substance abuse, uncomplicated: Secondary | ICD-10-CM | POA: Diagnosis not present

## 2022-09-23 DIAGNOSIS — J449 Chronic obstructive pulmonary disease, unspecified: Secondary | ICD-10-CM | POA: Insufficient documentation

## 2022-09-23 DIAGNOSIS — D696 Thrombocytopenia, unspecified: Secondary | ICD-10-CM | POA: Insufficient documentation

## 2022-09-23 DIAGNOSIS — M12811 Other specific arthropathies, not elsewhere classified, right shoulder: Secondary | ICD-10-CM | POA: Insufficient documentation

## 2022-09-23 LAB — COMPREHENSIVE METABOLIC PANEL
ALT: 21 U/L (ref 0–44)
AST: 24 U/L (ref 15–41)
Albumin: 4.5 g/dL (ref 3.5–5.0)
Alkaline Phosphatase: 44 U/L (ref 38–126)
Anion gap: 8 (ref 5–15)
BUN: 18 mg/dL (ref 8–23)
CO2: 25 mmol/L (ref 22–32)
Calcium: 9 mg/dL (ref 8.9–10.3)
Chloride: 102 mmol/L (ref 98–111)
Creatinine, Ser: 0.72 mg/dL (ref 0.61–1.24)
GFR, Estimated: >60 mL/min (ref >60)
Glucose, Bld: 89 mg/dL (ref 70–99)
Potassium: 4 mmol/L (ref 3.5–5.1)
Sodium: 135 mmol/L (ref 135–145)
Total Bilirubin: 0.5 mg/dL (ref 0.3–1.2)
Total Protein: 7 g/dL (ref 6.5–8.1)

## 2022-09-23 LAB — CBC
HCT: 42.6 % (ref 39.0–52.0)
Hemoglobin: 14.7 g/dL (ref 13.0–17.0)
MCH: 32.9 pg (ref 26.0–34.0)
MCHC: 34.5 g/dL (ref 30.0–36.0)
MCV: 95.3 fL (ref 80.0–100.0)
Platelets: 72 10*3/uL — ABNORMAL LOW (ref 150–400)
RBC: 4.47 MIL/uL (ref 4.22–5.81)
RDW: 13.2 % (ref 11.5–15.5)
WBC: 4.2 10*3/uL (ref 4.0–10.5)
nRBC: 0 % (ref 0.0–0.2)

## 2022-09-23 LAB — SURGICAL PCR SCREEN
MRSA, PCR: NEGATIVE
Staphylococcus aureus: NEGATIVE

## 2022-09-24 ENCOUNTER — Telehealth: Payer: Self-pay

## 2022-09-24 ENCOUNTER — Encounter (HOSPITAL_COMMUNITY): Payer: Self-pay | Admitting: Certified Registered Nurse Anesthetist

## 2022-09-24 ENCOUNTER — Encounter (HOSPITAL_COMMUNITY): Payer: Self-pay | Admitting: Physician Assistant

## 2022-09-24 NOTE — Telephone Encounter (Signed)
Conversation Via [employer] between Vincent Gros, NP and myself:  Morrie Sheldon:  [facility] is calling wanting to know if you feel comfortable clearing pt from Cardiac standpoint or do they need to postpone surgery for him to follow up with cardiology for clearance.  Heather: when he came for surgical clearance, I was told that they would be doing ECG and cleared otherwise. I don't feel comfortable. last ECG was abnormal.   They state that they will cancel surgery for now if Cardiology can't see him tomorrow. She was going to reach out to them.   FYI
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
## 2022-09-25 ENCOUNTER — Encounter: Payer: Self-pay | Admitting: Cardiology

## 2022-09-25 ENCOUNTER — Ambulatory Visit: Payer: 59 | Admitting: Cardiology

## 2022-09-25 VITALS — BP 102/66 | HR 59 | Resp 16 | Ht 68.0 in | Wt 183.0 lb

## 2022-09-25 DIAGNOSIS — Z01818 Encounter for other preprocedural examination: Secondary | ICD-10-CM | POA: Insufficient documentation

## 2022-09-25 DIAGNOSIS — I2510 Atherosclerotic heart disease of native coronary artery without angina pectoris: Secondary | ICD-10-CM
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
## 2022-09-26 ENCOUNTER — Encounter (HOSPITAL_COMMUNITY): Admission: RE | Payer: Self-pay | Source: Ambulatory Visit

## 2022-09-26 ENCOUNTER — Ambulatory Visit (HOSPITAL_COMMUNITY): Admission: RE | Admit: 2022-09-26 | Payer: 59 | Source: Ambulatory Visit | Admitting: Orthopedic Surgery

## 2022-09-26 ENCOUNTER — Other Ambulatory Visit: Payer: Self-pay | Admitting: Nurse Practitioner

## 2022-09-26 DIAGNOSIS — E785 Hyperlipidemia, unspecified: Secondary | ICD-10-CM

## 2022-09-26 SURGERY — ARTHROPLASTY, SHOULDER, TOTAL, REVERSE
Anesthesia: Choice | Site: Shoulder | Laterality: Right

## 2022-09-30 ENCOUNTER — Telehealth: Payer: Self-pay

## 2022-09-30 ENCOUNTER — Other Ambulatory Visit: Payer: Self-pay | Admitting: Nurse Practitioner

## 2022-09-30 DIAGNOSIS — N3281 Overactive bladder: Secondary | ICD-10-CM

## 2022-09-30 MED ORDER — OXYBUTYNIN CHLORIDE ER 10 MG PO TB24
10.0000 mg | ORAL_TABLET | Freq: Every day | ORAL | 1 refills | Status: DC
Start: 2022-09-30 — End: 2023-02-11

## 2022-09-30 NOTE — Telephone Encounter (Signed)
Please let the patient know that I increased the dose to 10 mg and sent new prescription to [facility]. He can double up on what he ha until new prescription is ready. If still ineffective, I can increase again. He should call again as needed. Thanks so much.   -HB

## 2022-09-30 NOTE — Telephone Encounter (Signed)
Pt called to inform the provider that the medication he was put on for bladder didn't help and thought that it was discussed that the dosage could be increase.   Pt didn't know the name.

## 2022-10-02 ENCOUNTER — Telehealth: Payer: Self-pay

## 2022-10-02 NOTE — Telephone Encounter (Signed)
Called patient to schedule Medicare Annual Wellness Visit (AWV). Left message for patient to call back and schedule Medicare Annual Wellness Visit (AWV).  Last date of AWV: 04/20/19  Please schedule an appointment at any time on the Annual Wellness Visit schedule.

## 2022-10-06 ENCOUNTER — Ambulatory Visit: Payer: 59

## 2022-10-06 DIAGNOSIS — Z01818 Encounter for other preprocedural examination: Secondary | ICD-10-CM

## 2022-10-06 DIAGNOSIS — I2510 Atherosclerotic heart disease of native coronary artery without angina pectoris: Secondary | ICD-10-CM

## 2022-10-20 ENCOUNTER — Ambulatory Visit: Payer: 59

## 2022-10-20 DIAGNOSIS — I2510 Atherosclerotic heart disease of native coronary artery without angina pectoris: Secondary | ICD-10-CM

## 2022-10-20 DIAGNOSIS — Z01818 Encounter for other preprocedural examination: Secondary | ICD-10-CM

## 2022-10-22 NOTE — Progress Notes (Signed)
Called and spoke with patient regarding his stress test results.

## 2022-10-22 NOTE — Progress Notes (Signed)
LMTCB

## 2022-11-02 NOTE — Progress Notes (Signed)
COVID Vaccine received:  []  No [x]  Yes Date of any COVID positive Test in last 90 days: None   PCP - Vincent Gros, NP surgical clearance 09-08-22  Epic note Cardiologist - Yates Decamp, MD.  Dr. Truett Mainland saw 09-25-22, cardiac clearance  Neurology- Butch Penny MD  clearance on Chart 09-11-2022   Chest x-ray - 06-26-2022 2v Epic EKG -  09-25-2022  Epic Stress Test - 11-17-2018  Epic ECHO - 12-26-2021 Epic Cardiac Cath - 9-2- 2006 DES x 1,  Done at [facility]    PCR screen: [x]  Ordered & Completed           []   No Order but Needs PROFEND           []   N/A for this surgery   Surgery Plan:  []  Ambulatory                            [x]  Outpatient in bed                            []  Admit   Anesthesia:    []  General  []  Spinal                           [x]   Choice []   MAC   Pacemaker / ICD device [x]  No []  Yes   Spinal Cord Stimulator:[x]  No []  Yes       History of Sleep Apnea? [x]  No []  Yes   CPAP used?- [x]  No []  Yes     Does the patient monitor blood sugar?          []  No []  Yes  [x]  N/A   Patient has: [x]  NO Hx DM   []  Pre-DM                 []  DM1  []   DM2   Blood Thinner / Instructions: none Aspirin Instructions:none   ERAS Protocol Ordered: []  No  [x]  Yes PRE-SURGERY [x]  ENSURE  []  G2  Patient is to be NPO after: 06:45 am   Comments: Lives alone and doesn't drive d/t seizure history   Patient was given the 5 CHG shower / bath instructions for THA surgery along with 2 bottles of the CHG soap. Patient will start this on: Monday Nov 03, 2022??? (Only 4 CHG are possible d/t timing of his PST appt. Patient voiced understanding of this process.    The patient was given Benzoyl peroxide Gel as ordered. Instruction regarding application starting 2 days prior to surgery was given and patient voiced understanding.    Activity level: Patient is able to climb a flight of stairs without difficulty; [x]  No CP   but would have SOB.   Patient can perform ADLs without assistance.     Anesthesia review: Seizures (last 12-2021), tremors, HTN, COPD,CAD- Hx MI (cath-DESx1) HRpEF, Bipolar 1, HEP B&C, Liver cirrhosis, Thrombocytopenia (on 09-23-22 ?72) CVA, substance abuse,    Patient denies shortness of breath, fever, cough and chest pain at PAT appointment.   Patient verbalized understanding and agreement to the Pre-Surgical Instructions that were given to them at this PAT appointment. Patient was also educated of the need to review these PAT instructions again prior to his surgery.I reviewed the appropriate phone numbers to call if they have any and questions or concerns.

## 2022-11-02 NOTE — Patient Instructions (Signed)
SURGICAL WAITING ROOM VISITATION Patients having surgery or a procedure may have no more than 2 support people in the waiting area - these visitors may rotate in the visitor waiting room.   Due to an increase in RSV and influenza rates and associated hospitalizations, children ages 66 and under may not visit patients in [facility] hospitals. If the patient needs to stay at the hospital during part of their recovery, the visitor guidelines for inpatient rooms apply.   PRE-OP VISITATION  Pre-op nurse will coordinate an appropriate time for 1 support person to accompany the patient in pre-op.  This support person may not rotate.  This visitor will be contacted when the time is appropriate for the visitor to come back in the pre-op area.   Please refer to the [facility] website for the visitor guidelines for Inpatients (after your surgery is over and you are in a regular room).   You are not required to quarantine at this time prior to your surgery. However, you must do this: Hand Hygiene often Do NOT share personal items Notify your provider if you are in close contact with someone who has COVID or you develop fever 100.4 or greater, new onset of sneezing, cough, sore throat, shortness of breath or body aches.  If you test positive for Covid or have been in contact with anyone that has tested positive in the last 10 days please notify you surgeon.     Your procedure is scheduled on:   Friday  Nov 07, 2022   Report to [facility] Main Entrance: [facility] entrance where the [employer] is available.    Report to admitting at: 12:45 PM   +++++Call this number if you have any questions or problems the morning of surgery [phone]   Do not eat food after Midnight the night prior to your surgery/procedure.   After Midnight you may have the following liquids until   12:15 PM  DAY OF SURGERY   Clear Liquid Diet Water Black Coffee (sugar ok, NO MILK/CREAM OR CREAMERS)  Tea (sugar  ok, NO MILK/CREAM OR CREAMERS) regular and decaf                             Plain Jell-O  with no fruit (NO RED)                                           Fruit ices (not with fruit pulp, NO RED)                                     Popsicles (NO RED)                                                                  Juice: apple, WHITE grape, WHITE cranberry Sports drinks like Gatorade or Powerade (NO RED)                           The day of surgery:  Drink  ONE (1) Pre-Surgery Clear Ensure at 12:15 PM the morning of surgery. Drink in one sitting. Do not sip.  This drink was given to you during your hospital pre-op appointment visit. Nothing else to drink after completing the Pre-Surgery Clear Ensure: No candy, chewing gum or throat lozenges.     FOLLOW ANY ADDITIONAL PRE OP INSTRUCTIONS YOU RECEIVED FROM YOUR SURGEON'S OFFICE!!!    Oral Hygiene is also important to reduce your risk of infection.        Remember - BRUSH YOUR TEETH THE MORNING OF SURGERY WITH YOUR REGULAR TOOTHPASTE   Do NOT smoke after Midnight the night before surgery.   Take ONLY these medicines the morning of surgery with A SIP OF WATER: Metoprolol, sertraline (Zoloft), oxcarbazepine (Triliptal), buspirone (Buspar), and Tramadol if needed for pain.   You may use your Symbicort and Albuterol inhalers as needed.      You may not have any metal on your body including  jewelry, and body piercing   Do not wear  lotions, powders,  cologne, or deodorant   Men may shave face and neck.   You may bring a small overnight bag with you on the day of surgery, only pack items that are not valuable. [facility] IS NOT RESPONSIBLE   FOR VALUABLES THAT ARE LOST OR STOLEN.    Do not bring your home medications to the hospital. The Pharmacy will dispense medications listed on your medication list to you during your admission in the Hospital.    Please read over the following fact sheets you were given: IF YOU HAVE QUESTIONS ABOUT  YOUR PRE-OP INSTRUCTIONS, PLEASE CALL [phone].       Pre-operative 5 CHG Bath Instructions   You can play a key role in reducing the risk of infection after surgery. Your skin needs to be as free of germs as possible. You can reduce the number of germs on your skin by washing with CHG (chlorhexidine gluconate) soap before surgery. CHG is an antiseptic soap that kills germs and continues to kill germs even after washing.   DO NOT use if you have an allergy to chlorhexidine/CHG or antibacterial soaps. If your skin becomes reddened or irritated, stop using the CHG and notify one of our RNs at [phone]  Please shower with the CHG soap starting 4 days before surgery using the following schedule: START SHOWER TUESDAY  Nov 04, 2022                                                                                                                                                                                      Please keep in mind the following:  DO NOT shave, including legs and underarms, starting the day of your first shower.   You may shave your face at any point before/day of surgery.   Place clean sheets on your bed the day you start using CHG soap. Use a clean washcloth (not used since being washed) for each shower. DO NOT sleep with pets once you start using the CHG.   CHG Shower Instructions:  If you choose to wash your hair and private area, wash first with your normal shampoo/soap.  After you use shampoo/soap, rinse your hair and body thoroughly to remove shampoo/soap residue.  Turn the water OFF and apply about 3 tablespoons (45 ml) of CHG soap to a CLEAN washcloth.  Apply CHG soap ONLY FROM YOUR NECK DOWN TO YOUR TOES (washing for 3-5 minutes)  DO NOT use CHG soap on face, private areas, open wounds, or sores.  Pay special attention to the area where your surgery is being performed.  If you are having back surgery, having someone wash your back for you may be  helpful.  Wait 2 minutes after CHG soap is applied, then you may rinse off the CHG soap.  Pat dry with a clean towel  Put on clean clothes/pajamas   If you choose to wear lotion, please use ONLY the CHG-compatible lotions on the back of this paper.     Additional instructions for the day of surgery: DO NOT APPLY any lotions, deodorants, cologne, or perfumes.   Put on clean/comfortable clothes.  Brush your teeth.  Ask your nurse before applying any prescription medications to the skin.      CHG Compatible Lotions   Aveeno Moisturizing lotion  Cetaphil Moisturizing Cream  Cetaphil Moisturizing Lotion  Clairol Herbal Essence Moisturizing Lotion, Dry Skin  Clairol Herbal Essence Moisturizing Lotion, Extra Dry Skin  Clairol Herbal Essence Moisturizing Lotion, Normal Skin  Curel Age Defying Therapeutic Moisturizing Lotion with Alpha Hydroxy  Curel Extreme Care Body Lotion  Curel Soothing Hands Moisturizing Hand Lotion  Curel Therapeutic Moisturizing Cream, Fragrance-Free  Curel Therapeutic Moisturizing Lotion, Fragrance-Free  Curel Therapeutic Moisturizing Lotion, Original Formula  Eucerin Daily Replenishing Lotion  Eucerin Dry Skin Therapy Plus Alpha Hydroxy Crme  Eucerin Dry Skin Therapy Plus Alpha Hydroxy Lotion  Eucerin Original Crme  Eucerin Original Lotion  Eucerin Plus Crme Eucerin Plus Lotion  Eucerin TriLipid Replenishing Lotion  Keri Anti-Bacterial Hand Lotion  Keri Deep Conditioning Original Lotion Dry Skin Formula Softly Scented  Keri Deep Conditioning Original Lotion, Fragrance Free Sensitive Skin Formula  Keri Lotion Fast Absorbing Fragrance Free Sensitive Skin Formula  Keri Lotion Fast Absorbing Softly Scented Dry Skin Formula  Keri Original Lotion  Keri Skin Renewal Lotion Keri Silky Smooth Lotion  Keri Silky Smooth Sensitive Skin Lotion  Nivea Body Creamy Conditioning Oil  Nivea Body Extra Enriched Lotion  Nivea Body Original Lotion  Nivea Body Sheer  Moisturizing Lotion Nivea Crme  Nivea Skin Firming Lotion  NutraDerm 30 Skin Lotion  NutraDerm Skin Lotion  NutraDerm Therapeutic Skin Cream  NutraDerm Therapeutic Skin Lotion  ProShield Protective Hand Cream  Provon moisturizing lotion         Preparing for Total Shoulder Arthroplasty ================================================================= Please follow these instructions carefully, in addition to any other special Bathing information that was explained to you at the Presurgical Appointment:   BENZOYL PEROXIDE 5% GEL: Used to kill bacteria on the skin which could cause an infection at the surgery site.    Please do not use if you have an allergy  to benzoyl peroxide. If your skin becomes reddened/irritated stop using the benzoyl peroxide and inform your Doctor.    Starting two days before surgery, apply as follows:   1. Apply benzoyl peroxide gel in the morning and at night. Apply after taking a shower. If you are not taking a shower, clean entire shoulder front, back, and side, along with the armpit with a clean wet washcloth.   2. Place a quarter-sized dollop of the gel on your SHOULDER and rub in thoroughly, making sure to cover the front, back, and side of your shoulder, along with the armpit.               2 Days prior to Surgery  [facility]  11-05-2022 First Application _______ Morning Second Application _______ Night   Day Before Surgery      THURSDAY 11-06-2022 First Application______ Morning   On the night before surgery, wash your entire body (except hair, face and private areas) with CHG Soap. THEN, rub in the LAST application of the Benzoyl Peroxide Gel on your shoulder.    3. On the Morning of Surgery wash your BODY AGAIN with CHG Soap (except hair, face and private areas)   4. DO NOT USE THE BENZOYL PEROXIDE GEL ON THE DAY OF YOUR SURGERY                      FAILURE TO FOLLOW THESE INSTRUCTIONS MAY RESULT IN THE CANCELLATION OF YOUR SURGERY    PATIENT SIGNATURE_________________________________   NURSE SIGNATURE__________________________________   ________________________________________________________________________        [name]      An incentive spirometer is a tool that can help keep your lungs clear and active. This tool measures how well you are filling your lungs with each breath. Taking long deep breaths may help reverse or decrease the chance of developing breathing (pulmonary) problems (especially infection) following: A long period of time when you are unable to move or be active. BEFORE THE PROCEDURE  If the spirometer includes an indicator to show your best effort, your nurse or respiratory therapist will set it to a desired goal. If possible, sit up straight or lean slightly forward. Try not to slouch. Hold the incentive spirometer in an upright position. INSTRUCTIONS FOR USE  Sit on the edge of your bed if possible, or sit up as far as you can in bed or on a chair. Hold the incentive spirometer in an upright position. Breathe out normally. Place the mouthpiece in your mouth and seal your lips tightly around it. Breathe in slowly and as deeply as possible, raising the piston or the ball toward the top of the column. Hold your breath for 3-5 seconds or for as long as possible. Allow the piston or ball to fall to the bottom of the column. Remove the mouthpiece from your mouth and breathe out normally. Rest for a few seconds and repeat Steps 1 through 7 at least 10 times every 1-2 hours when you are awake. Take your time and take a few normal breaths between deep breaths. The spirometer may include an indicator to show your best effort. Use the indicator as a goal to work toward during each repetition. After each set of 10 deep breaths, practice coughing to be sure your lungs are clear. If you have an incision (the cut made at the time of surgery), support your incision when coughing by placing a  pillow or rolled up towels firmly against it. Once you  are able to get out of bed, walk around indoors and cough well. You may stop using the incentive spirometer when instructed by your caregiver.  RISKS AND COMPLICATIONS Take your time so you do not get dizzy or light-headed. If you are in pain, you may need to take or ask for pain medication before doing incentive spirometry. It is harder to take a deep breath if you are having pain. AFTER USE Rest and breathe slowly and easily. It can be helpful to keep track of a log of your progress. Your caregiver can provide you with a simple table to help with this. If you are using the spirometer at home, follow these instructions: SEEK MEDICAL CARE IF:  You are having difficultly using the spirometer. You have trouble using the spirometer as often as instructed. Your pain medication is not giving enough relief while using the spirometer. You develop fever of 100.5 F (38.1 C) or higher.                                                                                                    SEEK IMMEDIATE MEDICAL CARE IF:  You cough up bloody sputum that had not been present before. You develop fever of 102 F (38.9 C) or greater. You develop worsening pain at or near the incision site. MAKE SURE YOU:  Understand these instructions. Will watch your condition. Will get help right away if you are not doing well or get worse. Document Released: 10/13/2006 Document Revised: 08/25/2011 Document Reviewed: 12/14/2006

## 2022-11-04 ENCOUNTER — Encounter (HOSPITAL_COMMUNITY)
Admission: RE | Admit: 2022-11-04 | Discharge: 2022-11-04 | Disposition: A | Discharge status: Home / Self Care | Payer: 59 | Source: Ambulatory Visit | Attending: Orthopedic Surgery | Admitting: Orthopedic Surgery

## 2022-11-04 ENCOUNTER — Encounter (HOSPITAL_COMMUNITY): Payer: Self-pay

## 2022-11-04 ENCOUNTER — Other Ambulatory Visit: Payer: Self-pay

## 2022-11-04 VITALS — BP 146/92 | HR 62 | Temp 98.6°F | Resp 18 | Ht 68.0 in | Wt 168.0 lb

## 2022-11-04 DIAGNOSIS — I252 Old myocardial infarction: Secondary | ICD-10-CM | POA: Insufficient documentation

## 2022-11-04 DIAGNOSIS — F319 Bipolar disorder, unspecified: Secondary | ICD-10-CM | POA: Insufficient documentation

## 2022-11-04 DIAGNOSIS — M12811 Other specific arthropathies, not elsewhere classified, right shoulder: Secondary | ICD-10-CM | POA: Diagnosis not present

## 2022-11-04 DIAGNOSIS — Z01818 Encounter for other preprocedural examination: Secondary | ICD-10-CM

## 2022-11-04 DIAGNOSIS — Z79899 Other long term (current) drug therapy: Secondary | ICD-10-CM | POA: Diagnosis not present

## 2022-11-04 DIAGNOSIS — I110 Hypertensive heart disease with heart failure: Secondary | ICD-10-CM | POA: Insufficient documentation

## 2022-11-04 DIAGNOSIS — Z8673 Personal history of transient ischemic attack (TIA), and cerebral infarction without residual deficits: Secondary | ICD-10-CM | POA: Diagnosis not present

## 2022-11-04 DIAGNOSIS — F419 Anxiety disorder, unspecified: Secondary | ICD-10-CM | POA: Diagnosis not present

## 2022-11-04 DIAGNOSIS — Z7951 Long term (current) use of inhaled steroids: Secondary | ICD-10-CM | POA: Diagnosis not present

## 2022-11-04 DIAGNOSIS — D696 Thrombocytopenia, unspecified: Secondary | ICD-10-CM

## 2022-11-04 DIAGNOSIS — F1910 Other psychoactive substance abuse, uncomplicated: Secondary | ICD-10-CM

## 2022-11-04 LAB — CBC
HCT: 40.5 % (ref 39.0–52.0)
Hemoglobin: 14.4 g/dL (ref 13.0–17.0)
MCH: 33 pg (ref 26.0–34.0)
MCHC: 35.6 g/dL (ref 30.0–36.0)
MCV: 92.7 fL (ref 80.0–100.0)
Platelets: 62 10*3/uL — ABNORMAL LOW (ref 150–400)
RBC: 4.37 MIL/uL (ref 4.22–5.81)
RDW: 14.4 % (ref 11.5–15.5)
WBC: 4.2 10*3/uL (ref 4.0–10.5)
nRBC: 0 % (ref 0.0–0.2)

## 2022-11-04 LAB — COMPREHENSIVE METABOLIC PANEL WITH GFR
ALT: 21 U/L (ref 0–44)
AST: 29 U/L (ref 15–41)
Albumin: 4.3 g/dL (ref 3.5–5.0)
Alkaline Phosphatase: 42 U/L (ref 38–126)
Anion gap: 9 (ref 5–15)
BUN: 12 mg/dL (ref 8–23)
CO2: 23 mmol/L (ref 22–32)
Calcium: 9.2 mg/dL (ref 8.9–10.3)
Chloride: 106 mmol/L (ref 98–111)
Creatinine, Ser: 0.73 mg/dL (ref 0.61–1.24)
GFR, Estimated: >60 mL/min
Glucose, Bld: 110 mg/dL — ABNORMAL HIGH (ref 70–99)
Potassium: 3.7 mmol/L (ref 3.5–5.1)
Sodium: 138 mmol/L (ref 135–145)
Total Bilirubin: 0.8 mg/dL (ref 0.3–1.2)
Total Protein: 6.6 g/dL (ref 6.5–8.1)

## 2022-11-04 LAB — SURGICAL PCR SCREEN
MRSA, PCR: NEGATIVE
Staphylococcus aureus: NEGATIVE
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
## 2022-11-05 NOTE — Anesthesia Preprocedure Evaluation (Addendum)
Anesthesia Evaluation  Patient identified by MRN, date of birth, ID band Patient awake    Reviewed: Allergy & Precautions, H&P , NPO status , Patient's Chart, lab work & pertinent test results  Airway Mallampati: II  TM Distance: >3 FB Neck ROM: Full    Dental no notable dental hx.    Pulmonary COPD,  COPD inhaler, Current Smoker and Patient abstained from smoking.   Pulmonary exam normal breath sounds clear to auscultation       Cardiovascular hypertension, Pt. on medications and Pt. on home beta blockers + CAD and + Past MI  Normal cardiovascular exam Rhythm:Regular Rate:Normal  1. Left ventricular ejection fraction, by estimation, is 55 to 60%. The  left ventricle has normal function. The left ventricle demonstrates  regional wall motion abnormalities with basal inferior akinesis. There is  mild concentric left ventricular  hypertrophy. Left ventricular diastolic parameters are consistent with  Grade I diastolic dysfunction (impaired relaxation).   2. Right ventricular systolic function is normal. The right ventricular  size is normal. Tricuspid regurgitation signal is inadequate for assessing  PA pressure.   3. The aortic valve was not well visualized. Aortic valve regurgitation  is not visualized. No aortic stenosis is present.   4. The mitral valve is normal in structure. No evidence of mitral valve  regurgitation. No evidence of mitral stenosis.   5. The inferior vena cava is normal in size with greater than 50%  respiratory variability, suggesting right atrial pressure of 3 mmHg.      Neuro/Psych Seizures -, Well Controlled,     Bipolar Disorder   CVA    GI/Hepatic negative GI ROS,,,(+) Hepatitis -, B  Endo/Other  negative endocrine ROS    Renal/GU negative Renal ROS  negative genitourinary   Musculoskeletal negative musculoskeletal ROS (+)    Abdominal   Peds negative pediatric ROS (+)  Hematology  (+)  Blood dyscrasia Chronic thrombocytopenia   Anesthesia Other Findings   Reproductive/Obstetrics negative OB ROS                             Anesthesia Physical Anesthesia Plan  ASA: 3  Anesthesia Plan: General   Post-op Pain Management: Regional block*   Induction: Intravenous  PONV Risk Score and Plan: 1 and Ondansetron, Dexamethasone and Treatment may vary due to age or medical condition  Airway Management Planned: Oral ETT  Additional Equipment:   Intra-op Plan:   Post-operative Plan: Extubation in OR  Informed Consent: I have reviewed the patients History and Physical, chart, labs and discussed the procedure including the risks, benefits and alternatives for the proposed anesthesia with the patient or authorized representative who has indicated his/her understanding and acceptance.     Dental advisory given  Plan Discussed with: CRNA and Surgeon  Anesthesia Plan Comments: (See PAT note 11/04/2022)       Anesthesia Quick Evaluation

## 2022-11-07 ENCOUNTER — Other Ambulatory Visit: Payer: Self-pay

## 2022-11-07 ENCOUNTER — Observation Stay (HOSPITAL_COMMUNITY): Payer: 59

## 2022-11-07 ENCOUNTER — Observation Stay (HOSPITAL_COMMUNITY)
Admission: RE | Admit: 2022-11-07 | Discharge: 2022-11-08 | Disposition: A | Discharge status: Home / Self Care | Payer: 59 | Source: Ambulatory Visit | Attending: Orthopedic Surgery | Admitting: Orthopedic Surgery

## 2022-11-07 ENCOUNTER — Ambulatory Visit (HOSPITAL_COMMUNITY): Payer: 59 | Admitting: Physician Assistant

## 2022-11-07 ENCOUNTER — Encounter (HOSPITAL_COMMUNITY)
Admission: RE | Disposition: A | Discharge status: Home / Self Care | Payer: Self-pay | Source: Ambulatory Visit | Attending: Orthopedic Surgery

## 2022-11-07 ENCOUNTER — Encounter (HOSPITAL_COMMUNITY): Payer: Self-pay | Admitting: Orthopedic Surgery

## 2022-11-07 ENCOUNTER — Ambulatory Visit (HOSPITAL_BASED_OUTPATIENT_CLINIC_OR_DEPARTMENT_OTHER): Payer: 59 | Admitting: Anesthesiology

## 2022-11-07 DIAGNOSIS — Z8673 Personal history of transient ischemic attack (TIA), and cerebral infarction without residual deficits: Secondary | ICD-10-CM | POA: Diagnosis not present

## 2022-11-07 DIAGNOSIS — I252 Old myocardial infarction: Secondary | ICD-10-CM | POA: Diagnosis not present

## 2022-11-07 DIAGNOSIS — Z85828 Personal history of other malignant neoplasm of skin: Secondary | ICD-10-CM | POA: Insufficient documentation

## 2022-11-07 DIAGNOSIS — I110 Hypertensive heart disease with heart failure: Secondary | ICD-10-CM | POA: Insufficient documentation

## 2022-11-07 DIAGNOSIS — Z96611 Presence of right artificial shoulder joint: Secondary | ICD-10-CM

## 2022-11-07 DIAGNOSIS — M12811 Other specific arthropathies, not elsewhere classified, right shoulder: Principal | ICD-10-CM | POA: Insufficient documentation

## 2022-11-07 DIAGNOSIS — I119 Hypertensive heart disease without heart failure: Secondary | ICD-10-CM | POA: Diagnosis not present

## 2022-11-07 DIAGNOSIS — Z79899 Other long term (current) drug therapy: Secondary | ICD-10-CM | POA: Insufficient documentation

## 2022-11-07 DIAGNOSIS — J449 Chronic obstructive pulmonary disease, unspecified: Secondary | ICD-10-CM

## 2022-11-07 DIAGNOSIS — I2510 Atherosclerotic heart disease of native coronary artery without angina pectoris: Secondary | ICD-10-CM

## 2022-11-07 DIAGNOSIS — I509 Heart failure, unspecified: Secondary | ICD-10-CM | POA: Diagnosis not present

## 2022-11-07 DIAGNOSIS — F17290 Nicotine dependence, other tobacco product, uncomplicated: Secondary | ICD-10-CM | POA: Insufficient documentation

## 2022-11-07 DIAGNOSIS — F17210 Nicotine dependence, cigarettes, uncomplicated: Secondary | ICD-10-CM

## 2022-11-07 HISTORY — PX: REVERSE SHOULDER ARTHROPLASTY: SHX5054

## 2022-11-07 LAB — CBC
HCT: 39.6 % (ref 39.0–52.0)
Hemoglobin: 13.4 g/dL (ref 13.0–17.0)
MCH: 33 pg (ref 26.0–34.0)
MCHC: 33.8 g/dL (ref 30.0–36.0)
MCV: 97.5 fL (ref 80.0–100.0)
Platelets: 46 10*3/uL — ABNORMAL LOW (ref 150–400)
RBC: 4.06 MIL/uL — ABNORMAL LOW (ref 4.22–5.81)
RDW: 14.4 % (ref 11.5–15.5)
WBC: 2.5 10*3/uL — ABNORMAL LOW (ref 4.0–10.5)
nRBC: 0 % (ref 0.0–0.2)

## 2022-11-07 LAB — CREATININE, SERUM
Creatinine, Ser: 0.77 mg/dL (ref 0.61–1.24)
GFR, Estimated: >60 mL/min (ref >60)

## 2022-11-07 LAB — TYPE AND SCREEN
ABO/RH(D): A POS
Antibody Screen: NEGATIVE

## 2022-11-07 SURGERY — ARTHROPLASTY, SHOULDER, TOTAL, REVERSE
Anesthesia: General | Site: Shoulder | Laterality: Right

## 2022-11-07 MED ORDER — METOPROLOL SUCCINATE ER 25 MG PO TB24
12.5000 mg | ORAL_TABLET | Freq: Every day | ORAL | Status: DC
  Administered 2022-11-08: 12.5 mg via ORAL
  Filled 2022-11-07: qty 1

## 2022-11-07 MED ORDER — BUSPIRONE HCL 5 MG PO TABS
15.0000 mg | ORAL_TABLET | Freq: Three times a day (TID) | ORAL | Status: DC
  Administered 2022-11-07 – 2022-11-08 (×3): 15 mg via ORAL
  Filled 2022-11-07 (×3): qty 1

## 2022-11-07 MED ORDER — ACETAMINOPHEN 500 MG PO TABS
1000.0000 mg | ORAL_TABLET | Freq: Four times a day (QID) | ORAL | Status: AC
  Administered 2022-11-07 – 2022-11-08 (×4): 1000 mg via ORAL
  Filled 2022-11-07 (×4): qty 2

## 2022-11-07 MED ORDER — ONDANSETRON HCL 4 MG PO TABS: 4.0000 mg | ORAL_TABLET | Freq: Three times a day (TID) | ORAL | 0 refills | Status: DC | PRN

## 2022-11-07 MED ORDER — SERTRALINE HCL 100 MG PO TABS
200.0000 mg | ORAL_TABLET | Freq: Every day | ORAL | Status: DC
  Administered 2022-11-08: 200 mg via ORAL
  Filled 2022-11-07: qty 2

## 2022-11-07 MED ORDER — MIDAZOLAM HCL 2 MG/2ML IJ SOLN
1.0000 mg | INTRAMUSCULAR | Status: DC
  Administered 2022-11-07: 1 mg via INTRAVENOUS
  Filled 2022-11-07: qty 2

## 2022-11-07 MED ORDER — ONDANSETRON HCL 4 MG PO TABS: 4.0000 mg | ORAL_TABLET | Freq: Four times a day (QID) | ORAL | Status: DC | PRN

## 2022-11-07 MED ORDER — PHENOL 1.4 % MT LIQD: 1.0000 | OROMUCOSAL | Status: DC | PRN

## 2022-11-07 MED ORDER — LACTATED RINGERS IV SOLN: INTRAVENOUS | Status: DC

## 2022-11-07 MED ORDER — BUPIVACAINE HCL (PF) 0.5 % IJ SOLN
INTRAMUSCULAR | Status: DC | PRN
  Administered 2022-11-07: 15 mL via PERINEURAL

## 2022-11-07 MED ORDER — ACETAMINOPHEN 325 MG PO TABS: 325.0000 mg | ORAL_TABLET | Freq: Four times a day (QID) | ORAL | Status: DC | PRN

## 2022-11-07 MED ORDER — ROCURONIUM BROMIDE 10 MG/ML (PF) SYRINGE
PREFILLED_SYRINGE | INTRAVENOUS | Status: AC
  Filled 2022-11-07: qty 10

## 2022-11-07 MED ORDER — EPHEDRINE 5 MG/ML INJ
INTRAVENOUS | Status: AC
  Filled 2022-11-07: qty 5

## 2022-11-07 MED ORDER — ONDANSETRON HCL 4 MG/2ML IJ SOLN
INTRAMUSCULAR | Status: AC
  Filled 2022-11-07: qty 2

## 2022-11-07 MED ORDER — 0.9 % SODIUM CHLORIDE (POUR BTL) OPTIME
TOPICAL | Status: DC | PRN
  Administered 2022-11-07: 1000 mL

## 2022-11-07 MED ORDER — OXYCODONE HCL 5 MG PO TABS: 5.0000 mg | ORAL_TABLET | Freq: Once | ORAL | Status: AC | PRN

## 2022-11-07 MED ORDER — OXYCODONE HCL 5 MG PO TABS
ORAL_TABLET | ORAL | Status: AC
  Administered 2022-11-07: 5 mg via ORAL
  Filled 2022-11-07: qty 1

## 2022-11-07 MED ORDER — ONDANSETRON HCL 4 MG/2ML IJ SOLN: 4.0000 mg | Freq: Four times a day (QID) | INTRAMUSCULAR | Status: DC | PRN

## 2022-11-07 MED ORDER — LISINOPRIL 5 MG PO TABS
5.0000 mg | ORAL_TABLET | Freq: Every day | ORAL | Status: DC
  Administered 2022-11-08: 5 mg via ORAL
  Filled 2022-11-07: qty 1

## 2022-11-07 MED ORDER — FENTANYL CITRATE PF 50 MCG/ML IJ SOSY
50.0000 ug | PREFILLED_SYRINGE | INTRAMUSCULAR | Status: DC
  Administered 2022-11-07: 100 ug via INTRAVENOUS
  Filled 2022-11-07: qty 2

## 2022-11-07 MED ORDER — BUPIVACAINE LIPOSOME 1.3 % IJ SUSP
INTRAMUSCULAR | Status: DC | PRN
  Administered 2022-11-07: 10 mL via PERINEURAL

## 2022-11-07 MED ORDER — TRANEXAMIC ACID-NACL 1000-0.7 MG/100ML-% IV SOLN
1000.0000 mg | INTRAVENOUS | Status: AC
  Administered 2022-11-07: 1000 mg via INTRAVENOUS

## 2022-11-07 MED ORDER — PROPOFOL 10 MG/ML IV BOLUS
INTRAVENOUS | Status: AC
  Filled 2022-11-07: qty 20

## 2022-11-07 MED ORDER — EPHEDRINE SULFATE (PRESSORS) 50 MG/ML IJ SOLN
INTRAMUSCULAR | Status: DC | PRN
  Administered 2022-11-07 (×3): 5 mg via INTRAVENOUS

## 2022-11-07 MED ORDER — ATORVASTATIN CALCIUM 20 MG PO TABS
20.0000 mg | ORAL_TABLET | Freq: Every day | ORAL | Status: DC
  Administered 2022-11-07: 20 mg via ORAL
  Filled 2022-11-07: qty 1

## 2022-11-07 MED ORDER — SUCCINYLCHOLINE CHLORIDE 200 MG/10ML IV SOSY
PREFILLED_SYRINGE | INTRAVENOUS | Status: DC | PRN
  Administered 2022-11-07: 120 mg via INTRAVENOUS

## 2022-11-07 MED ORDER — FENTANYL CITRATE PF 50 MCG/ML IJ SOSY
25.0000 ug | PREFILLED_SYRINGE | INTRAMUSCULAR | Status: DC | PRN
  Administered 2022-11-07 (×2): 50 ug via INTRAVENOUS

## 2022-11-07 MED ORDER — VANCOMYCIN HCL 1000 MG IV SOLR
INTRAVENOUS | Status: DC | PRN
  Administered 2022-11-07: 1000 mg via TOPICAL

## 2022-11-07 MED ORDER — PHENYLEPHRINE HCL-NACL 20-0.9 MG/250ML-% IV SOLN
INTRAVENOUS | Status: AC
  Filled 2022-11-07: qty 250

## 2022-11-07 MED ORDER — HYDROMORPHONE HCL 1 MG/ML IJ SOLN: 0.5000 mg | INTRAMUSCULAR | Status: DC | PRN

## 2022-11-07 MED ORDER — METOCLOPRAMIDE HCL 5 MG PO TABS: 5.0000 mg | ORAL_TABLET | Freq: Three times a day (TID) | ORAL | Status: DC | PRN

## 2022-11-07 MED ORDER — SUCCINYLCHOLINE CHLORIDE 200 MG/10ML IV SOSY
PREFILLED_SYRINGE | INTRAVENOUS | Status: AC
  Filled 2022-11-07: qty 10

## 2022-11-07 MED ORDER — NITROGLYCERIN 0.4 MG SL SUBL: 0.4000 mg | SUBLINGUAL_TABLET | SUBLINGUAL | Status: DC | PRN

## 2022-11-07 MED ORDER — SUGAMMADEX SODIUM 500 MG/5ML IV SOLN
INTRAVENOUS | Status: DC | PRN
  Administered 2022-11-07: 200 mg via INTRAVENOUS

## 2022-11-07 MED ORDER — ALBUTEROL SULFATE HFA 108 (90 BASE) MCG/ACT IN AERS: 2.0000 | INHALATION_SPRAY | Freq: Four times a day (QID) | RESPIRATORY_TRACT | Status: DC | PRN

## 2022-11-07 MED ORDER — ONDANSETRON HCL 4 MG/2ML IJ SOLN: 4.0000 mg | Freq: Once | INTRAMUSCULAR | Status: DC | PRN

## 2022-11-07 MED ORDER — METOCLOPRAMIDE HCL 5 MG/ML IJ SOLN: 5.0000 mg | Freq: Three times a day (TID) | INTRAMUSCULAR | Status: DC | PRN

## 2022-11-07 MED ORDER — TRANEXAMIC ACID-NACL 1000-0.7 MG/100ML-% IV SOLN
INTRAVENOUS | Status: AC
  Filled 2022-11-07: qty 100

## 2022-11-07 MED ORDER — OXYCODONE HCL 5 MG PO TABS: 5.0000 mg | ORAL_TABLET | Freq: Four times a day (QID) | ORAL | 0 refills | Status: DC | PRN

## 2022-11-07 MED ORDER — LIDOCAINE HCL (CARDIAC) PF 100 MG/5ML IV SOSY
PREFILLED_SYRINGE | INTRAVENOUS | Status: DC | PRN
  Administered 2022-11-07: 100 mg via INTRAVENOUS

## 2022-11-07 MED ORDER — ORAL CARE MOUTH RINSE: 15.0000 mL | Freq: Once | OROMUCOSAL | Status: AC

## 2022-11-07 MED ORDER — OXYCODONE HCL 5 MG PO TABS: 5.0000 mg | ORAL_TABLET | ORAL | Status: DC | PRN

## 2022-11-07 MED ORDER — DEXAMETHASONE SODIUM PHOSPHATE 10 MG/ML IJ SOLN
INTRAMUSCULAR | Status: AC
  Filled 2022-11-07: qty 1

## 2022-11-07 MED ORDER — ROCURONIUM BROMIDE 100 MG/10ML IV SOLN
INTRAVENOUS | Status: DC | PRN
  Administered 2022-11-07: 50 mg via INTRAVENOUS

## 2022-11-07 MED ORDER — QUETIAPINE FUMARATE ER 200 MG PO TB24
200.0000 mg | ORAL_TABLET | Freq: Every day | ORAL | Status: DC
  Administered 2022-11-07: 200 mg via ORAL
  Filled 2022-11-07 (×2): qty 1

## 2022-11-07 MED ORDER — ONDANSETRON HCL 4 MG/2ML IJ SOLN
INTRAMUSCULAR | Status: DC | PRN
  Administered 2022-11-07: 4 mg via INTRAVENOUS

## 2022-11-07 MED ORDER — TRANEXAMIC ACID-NACL 1000-0.7 MG/100ML-% IV SOLN
1000.0000 mg | Freq: Once | INTRAVENOUS | Status: AC
  Administered 2022-11-07: 1000 mg via INTRAVENOUS
  Filled 2022-11-07: qty 100

## 2022-11-07 MED ORDER — CHLORHEXIDINE GLUCONATE 0.12 % MT SOLN
15.0000 mL | Freq: Once | OROMUCOSAL | Status: AC
  Administered 2022-11-07: 15 mL via OROMUCOSAL

## 2022-11-07 MED ORDER — OXYCODONE HCL 5 MG PO TABS
10.0000 mg | ORAL_TABLET | ORAL | Status: DC | PRN
  Administered 2022-11-07 – 2022-11-08 (×4): 15 mg via ORAL
  Filled 2022-11-07 (×4): qty 3

## 2022-11-07 MED ORDER — DOCUSATE SODIUM 100 MG PO CAPS
100.0000 mg | ORAL_CAPSULE | Freq: Two times a day (BID) | ORAL | Status: DC
  Administered 2022-11-07 – 2022-11-08 (×2): 100 mg via ORAL
  Filled 2022-11-07 (×2): qty 1

## 2022-11-07 MED ORDER — TRAZODONE HCL 100 MG PO TABS: 200.0000 mg | ORAL_TABLET | Freq: Every evening | ORAL | Status: DC | PRN

## 2022-11-07 MED ORDER — VANCOMYCIN HCL 1000 MG IV SOLR
INTRAVENOUS | Status: AC
  Filled 2022-11-07: qty 20

## 2022-11-07 MED ORDER — PRAZOSIN HCL 1 MG PO CAPS
1.0000 mg | ORAL_CAPSULE | Freq: Every morning | ORAL | Status: DC
  Administered 2022-11-08: 1 mg via ORAL
  Filled 2022-11-07: qty 1

## 2022-11-07 MED ORDER — CEFAZOLIN SODIUM-DEXTROSE 2-4 GM/100ML-% IV SOLN
INTRAVENOUS | Status: AC
  Filled 2022-11-07: qty 100

## 2022-11-07 MED ORDER — DEXAMETHASONE SODIUM PHOSPHATE 10 MG/ML IJ SOLN
INTRAMUSCULAR | Status: DC | PRN
  Administered 2022-11-07: 8 mg via INTRAVENOUS

## 2022-11-07 MED ORDER — CEFAZOLIN SODIUM-DEXTROSE 2-4 GM/100ML-% IV SOLN
2.0000 g | INTRAVENOUS | Status: AC
  Administered 2022-11-07: 2 g via INTRAVENOUS

## 2022-11-07 MED ORDER — MENTHOL 3 MG MT LOZG: 1.0000 | LOZENGE | OROMUCOSAL | Status: DC | PRN

## 2022-11-07 MED ORDER — ENOXAPARIN SODIUM 40 MG/0.4ML IJ SOSY
40.0000 mg | PREFILLED_SYRINGE | INTRAMUSCULAR | Status: DC
  Administered 2022-11-08: 40 mg via SUBCUTANEOUS
  Filled 2022-11-07: qty 0.4

## 2022-11-07 MED ORDER — DIVALPROEX SODIUM 250 MG PO DR TAB
500.0000 mg | DELAYED_RELEASE_TABLET | Freq: Three times a day (TID) | ORAL | Status: DC
  Administered 2022-11-07 – 2022-11-08 (×3): 500 mg via ORAL
  Filled 2022-11-07 (×3): qty 2

## 2022-11-07 MED ORDER — ALBUTEROL SULFATE (2.5 MG/3ML) 0.083% IN NEBU: 2.5000 mg | INHALATION_SOLUTION | Freq: Four times a day (QID) | RESPIRATORY_TRACT | Status: DC | PRN

## 2022-11-07 MED ORDER — PHENYLEPHRINE HCL-NACL 20-0.9 MG/250ML-% IV SOLN
INTRAVENOUS | Status: DC | PRN
  Administered 2022-11-07: 50 ug/min via INTRAVENOUS

## 2022-11-07 MED ORDER — OXCARBAZEPINE 150 MG PO TABS
600.0000 mg | ORAL_TABLET | Freq: Two times a day (BID) | ORAL | Status: DC
  Administered 2022-11-07 – 2022-11-08 (×2): 600 mg via ORAL
  Filled 2022-11-07 (×2): qty 4

## 2022-11-07 MED ORDER — OXYCODONE HCL 5 MG/5ML PO SOLN: 5.0000 mg | Freq: Once | ORAL | Status: AC | PRN

## 2022-11-07 MED ORDER — STERILE WATER FOR IRRIGATION IR SOLN
Status: DC | PRN
  Administered 2022-11-07: 2000 mL

## 2022-11-07 MED ORDER — FENTANYL CITRATE PF 50 MCG/ML IJ SOSY
PREFILLED_SYRINGE | INTRAMUSCULAR | Status: AC
  Administered 2022-11-07: 50 ug via INTRAVENOUS
  Filled 2022-11-07: qty 3

## 2022-11-07 MED ORDER — MOMETASONE FURO-FORMOTEROL FUM 100-5 MCG/ACT IN AERO
2.0000 | INHALATION_SPRAY | Freq: Two times a day (BID) | RESPIRATORY_TRACT | Status: DC
  Administered 2022-11-07 – 2022-11-08 (×2): 2 via RESPIRATORY_TRACT
  Filled 2022-11-07: qty 8.8

## 2022-11-07 MED ORDER — PROPOFOL 10 MG/ML IV BOLUS
INTRAVENOUS | Status: DC | PRN
  Administered 2022-11-07: 150 mg via INTRAVENOUS

## 2022-11-07 MED ORDER — SODIUM CHLORIDE 0.9 % IV SOLN: INTRAVENOUS | Status: DC | PRN

## 2022-11-07 MED ORDER — CEFAZOLIN SODIUM-DEXTROSE 1-4 GM/50ML-% IV SOLN
1.0000 g | Freq: Four times a day (QID) | INTRAVENOUS | Status: AC
  Administered 2022-11-07 – 2022-11-08 (×3): 1 g via INTRAVENOUS
  Filled 2022-11-07 (×3): qty 50

## 2022-11-07 MED ORDER — METHOCARBAMOL 750 MG PO TABS: 750.0000 mg | ORAL_TABLET | Freq: Three times a day (TID) | ORAL | 1 refills | Status: DC | PRN

## 2022-11-07 MED ORDER — LIDOCAINE HCL (PF) 2 % IJ SOLN
INTRAMUSCULAR | Status: AC
  Filled 2022-11-07: qty 5

## 2022-11-07 SURGICAL SUPPLY — 70 items
AID PSTN UNV HD RSTRNT DISP (MISCELLANEOUS)
BAG COUNTER SPONGE SURGICOUNT (BAG) IMPLANT
BAG SPEC THK2 15X12 ZIP CLS (MISCELLANEOUS) ×1
BAG SPNG CNTER NS LX DISP (BAG)
BAG ZIPLOCK 12X15 (MISCELLANEOUS) ×1 IMPLANT
BIT DRILL FLUTED 3.0 STRL (BIT) IMPLANT
BLADE SAG 18X100X1.27 (BLADE) ×1 IMPLANT
BSPLAT GLND +2X24 MDLR (Joint) ×1 IMPLANT
COOLER ICEMAN CLASSIC (MISCELLANEOUS) ×1 IMPLANT
COVER BACK TABLE 60X90IN (DRAPES) ×1 IMPLANT
COVER SURGICAL LIGHT HANDLE (MISCELLANEOUS) ×1 IMPLANT
CUP SUT UNIV REVERS 39 +2 (Shoulder) IMPLANT
DRAPE ORTHO SPLIT 77X108 STRL (DRAPES) ×2
DRAPE SHEET LG 3/4 BI-LAMINATE (DRAPES) ×1 IMPLANT
DRAPE SURG 17X11 SM STRL (DRAPES) ×1 IMPLANT
DRAPE SURG ORHT 6 SPLT 77X108 (DRAPES) ×2 IMPLANT
DRAPE TOP 10253 STERILE (DRAPES) ×1 IMPLANT
DRAPE U-SHAPE 47X51 STRL (DRAPES) ×1 IMPLANT
DRSG AQUACEL AG ADV 3.5X 6 (GAUZE/BANDAGES/DRESSINGS) IMPLANT
DRSG AQUACEL AG ADV 3.5X10 (GAUZE/BANDAGES/DRESSINGS) ×1 IMPLANT
DURAPREP 26ML APPLICATOR (WOUND CARE) IMPLANT
ELECT REM PT RETURN 15FT ADLT (MISCELLANEOUS) ×1 IMPLANT
FACESHIELD WRAPAROUND (MASK) ×1 IMPLANT
FACESHIELD WRAPAROUND OR TEAM (MASK) ×1 IMPLANT
GAUZE XEROFORM 1X8 LF (GAUZE/BANDAGES/DRESSINGS) IMPLANT
GLENOID UNI REV MOD 24 +2 LAT (Joint) IMPLANT
GLENOSPHERE 39+4 LAT/24 UNI RV (Joint) IMPLANT
GLOVE BIO SURGEON STRL SZ7.5 (GLOVE) ×4 IMPLANT
GLOVE BIOGEL PI IND STRL 8 (GLOVE) ×2 IMPLANT
GOWN STRL REUS W/ TWL XL LVL3 (GOWN DISPOSABLE) ×2 IMPLANT
GOWN STRL REUS W/TWL XL LVL3 (GOWN DISPOSABLE) ×2
INSERT HUMERAL MED 39/ +3 (Shoulder) IMPLANT
KIT BASIN OR (CUSTOM PROCEDURE TRAY) ×1 IMPLANT
KIT TURNOVER KIT A (KITS) IMPLANT
MANIFOLD NEPTUNE II (INSTRUMENTS) ×1 IMPLANT
NDL TAPERED W/ NITINOL LOOP (MISCELLANEOUS) IMPLANT
NEEDLE TAPERED W/ NITINOL LOOP (MISCELLANEOUS) IMPLANT
NS IRRIG 1000ML POUR BTL (IV SOLUTION) ×1 IMPLANT
PACK SHOULDER (CUSTOM PROCEDURE TRAY) ×1 IMPLANT
PAD CAST 4YDX4 CTTN HI CHSV (CAST SUPPLIES) ×1 IMPLANT
PAD COLD SHLDR WRAP-ON (PAD) ×1 IMPLANT
PADDING CAST COTTON 4X4 STRL (CAST SUPPLIES) ×1
PIN NITINOL TARGETER 2.8 (PIN) IMPLANT
PIN SET MODULAR GLENOID SYSTEM (PIN) IMPLANT
RESTRAINT HEAD UNIVERSAL NS (MISCELLANEOUS) IMPLANT
SCREW CENTRAL MOD 35 (Screw) IMPLANT
SCREW LOCK PERI 5.5X44 (Screw) IMPLANT
SCREW PERI LOCK 5.5X16 (Screw) IMPLANT
SCREW PERI LOCK 5.5X24 (Screw) IMPLANT
SCREW PERIPHERAL 5.5X28 LOCK (Screw) IMPLANT
SLING ARM FOAM STRAP MED (SOFTGOODS) IMPLANT
SLING ARM IMMOBILIZER LRG (SOFTGOODS) IMPLANT
SMARTMIX MINI TOWER (MISCELLANEOUS)
SPONGE T-LAP 4X18 ~~LOC~~+RFID (SPONGE) IMPLANT
STEM HUMERAL UNI REVERS SZ6 (Stem) IMPLANT
STRIP CLOSURE SKIN 1/2X4 (GAUZE/BANDAGES/DRESSINGS) ×1 IMPLANT
SUCTION FRAZIER HANDLE 10FR (MISCELLANEOUS) ×1
SUCTION TUBE FRAZIER 10FR DISP (MISCELLANEOUS) ×1 IMPLANT
SUT FIBERWIRE #2 38 T-5 BLUE (SUTURE)
SUT MNCRL AB 3-0 PS2 18 (SUTURE) ×1 IMPLANT
SUT VIC AB 0 CT1 36 (SUTURE) ×1 IMPLANT
SUT VIC AB 1 CT1 36 (SUTURE) ×1 IMPLANT
SUT VIC AB 2-0 CT1 27 (SUTURE) ×1
SUT VIC AB 2-0 CT1 TAPERPNT 27 (SUTURE) ×1 IMPLANT
SUTURE FIBERWR #2 38 T-5 BLUE (SUTURE) IMPLANT
SUTURE TAPE 1.3 40 TPR END (SUTURE) ×1 IMPLANT
SUTURETAPE 1.3 40 TPR END (SUTURE) ×1
TOWEL OR 17X26 10 PK STRL BLUE (TOWEL DISPOSABLE) ×1 IMPLANT
TOWER SMARTMIX MINI (MISCELLANEOUS) IMPLANT
TUBE SUCTION HIGH CAP CLEAR NV (SUCTIONS) ×1 IMPLANT

## 2022-11-07 NOTE — Plan of Care (Signed)
  Problem: Education: Goal: Knowledge of the prescribed therapeutic regimen will improve Outcome: Progressing   Problem: Activity: Goal: Ability to tolerate increased activity will improve Outcome: Progressing   Problem: Education: Goal: Knowledge of General Education information will improve Description: Including pain rating scale, medication(s)/side effects and non-pharmacologic comfort measures Outcome: Progressing   Problem: Activity: Goal: Risk for activity intolerance will decrease Outcome: Progressing   Problem: Nutrition: Goal: Adequate nutrition will be maintained Outcome: Progressing   Problem: Elimination: Goal: Will not experience complications related to bowel motility Outcome: Progressing   Problem: Pain Managment: Goal: General experience of comfort will improve Outcome: Progressing   Problem: Safety: Goal: Ability to remain free from injury will improve Outcome: Progressing

## 2022-11-07 NOTE — Anesthesia Procedure Notes (Signed)
Anesthesia Regional Block: Interscalene brachial plexus block   Pre-Anesthetic Checklist: , timeout performed,  Correct Patient, Correct Site, Correct Laterality,  Correct Procedure, Correct Position, site marked,  Risks and benefits discussed,  Surgical consent,  Pre-op evaluation,  At surgeon's request and post-op pain management  Laterality: Right  Prep: chloraprep       Needles:  Injection technique: Single-shot  Needle Type: Echogenic Stimulator Needle     Needle Length: 9cm      Additional Needles:   Procedures:,,,, ultrasound used (permanent image in chart),,     Nerve Stimulator or Paresthesia:  Response: 0.45 mA  Additional Responses:   Narrative:  Start time: 11/07/2022 1:38 PM End time: 11/07/2022 1:48 PM Injection made incrementally with aspirations every 5 mL.  Performed by: Personally  Anesthesiologist: Eilene Ghazi, MD  Additional Notes: Patient tolerated the procedure well without complications

## 2022-11-07 NOTE — Op Note (Addendum)
11/07/2022  3:29 PM  PATIENT:  [name]    PRE-OPERATIVE DIAGNOSIS:  Right shoulder rotator cuff arthropathy  POST-OPERATIVE DIAGNOSIS:  Same  PROCEDURE:  Right REVERSE SHOULDER ARTHROPLASTY  SURGEON:  Yolonda Kida, MD  ASSISTANT: Dion Saucier, PA-C  Assistant attestation:  PA Sharon Seller was present for the entire procedure.  ANESTHESIA:   General with interscalene block Exparel  ESTIMATED BLOOD LOSS: 150 cc  PREOPERATIVE INDICATIONS:  [name] is a  67 y.o. male with a diagnosis of Right shoulder rotator cuff arthropathy who failed conservative measures and elected for surgical management.    The risks benefits and alternatives were discussed with the patient preoperatively including but not limited to the risks of infection, bleeding, nerve injury, cardiopulmonary complications, the need for revision surgery, dislocation, brachial plexus palsy, incomplete relief of pain, among others, and the patient was willing to proceed.  OPERATIVE IMPLANTS:  Arthrex universe reverse system with a humeral stem size 6 Standard humeral tray with a +3 polyethylene liner  24+2 mm lateralized glenosphere baseplate with a 35 mm central screw and 4 peripheral locking screws.  39 mm +4 mm lateralized glenosphere   OPERATIVE FINDINGS: Complete deficiency of supraspinatus and infraspinatus with retraction to the level of the glenoid.  Significant flattening of the long head of the biceps tendon consistent with longstanding rotator cuff arthropathy.  High riding humeral head noted as well.  Significant degenerative labral tearing superior and anterior.  Subscapularis and teres minor intact.  OPERATIVE PROCEDURE: The patient was brought to the operating room and placed in the supine position. General anesthesia was administered. IV antibiotics were given. A Foley was not placed. Time out was performed. The upper extremity was prepped and draped in usual sterile fashion. The patient  was in a beachchair position. Deltopectoral approach was carried out. The biceps was tenodesed to the pectoralis tendon with #2 Fiberwire. The subscapularis was released off of the bone.   I then performed circumferential releases of the humerus, and then dislocated the head, and then reamed with the reamer to the above named size.  I then applied the jig, and cut the humeral head in 30 of retroversion, and then turned my attention to the glenoid.  Deep retractors were placed, and I resected the labrum, and then placed a guidepin into the center position on the glenoid, with slight inferior inclination. I then reamed over the guidepin, and this created a small metaphyseal cancellus blush inferiorly, removing just the cartilage to the subchondral bone superiorly. The base plate was selected and impacted place, and then I secured it centrally with a nonlocking screw, and I had excellent purchase both inferiorly and superiorly. I placed a short locking screws on anterior and posterior aspects.  I then turned my attention to the glenosphere, and impacted this into place, and then secured it with a central setscrew.  The glenoid sphere was completely seated, and had engagement of the [facility] taper. I then turned my attention back to the humerus.  I sequentially broached, and then trialed, and was found to restore soft tissue tension, and it had 2 finger tightness. Therefore the above named components were selected. The shoulder felt stable throughout functional motion.   I then impacted the real prosthesis into place, as well as the real humeral tray, and reduced the shoulder. The shoulder had excellent motion, and was stable, and I irrigated the wounds copiously.   Subscapularis was repaired back to the anterior humerus through the suture cup suture  holes with the suture tape through the subscapularis tendon.  I then irrigated the shoulder copiously once more, placed 1 g of vancomycin powder into the  wound, repaired the deltopectoral interval with #2 FiberWire followed by subcutaneous Vicryl, then monocryl for the skin,  with Steri-Strips and sterile gauze for the skin. The patient was awakened and returned back in stable and satisfactory condition. There no complications and they tolerated the procedure well.  All counts were correct x2.  Disposition:  [name] will be able to use his right upper extremity immediately for activities of daily living.  However, otherwise we will maintain his sling for the first 2 weeks including sleeping.  He will be admitted for observation status postoperatively.  He will need occupational therapy as well as potential consideration for rehab placement.  He does have a very tenuous home situation with minimal help with his sister who is wheelchair-bound and has postpolio syndrome.  Hopefully he may qualify for rehab.  He will be on 81 mg aspirin once per day x 6 weeks postoperatively for DVT prophylaxis.
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
## 2022-11-07 NOTE — Discharge Instructions (Signed)
Orthopedic surgery discharge instructions:  -Maintain postoperative bandage until follow-up appointment.  This is waterproof, and you may begin showering on postoperative day #3.  Do not submerge underwater.  Maintain that bandage until your follow-up appointment in 2 weeks.  -No lifting over 2 pounds with operateive arm.  You may use the arm immediately for activities of daily living such as bathing, washing your face and brushing your teeth, eating, and getting dressed.  Otherwise maintain your sling when you are out of the house and sleeping.  -Apply ice liberally to the shoulder throughout the day.  For mild to moderate pain use Tylenol and Advil as needed around-the-clock.  For breakthrough pain use oxycodone as necessary.  -You will return to see Dr. Petrina Melby in the office in 2 weeks for routine postoperative check with x-rays.  

## 2022-11-07 NOTE — Transfer of Care (Signed)
Immediate Anesthesia Transfer of Care Note  Patient: [name]  Procedure(s) Performed: REVERSE SHOULDER ARTHROPLASTY (Right: Shoulder)  Patient Location: PACU  Anesthesia Type:General  Level of Consciousness: awake, drowsy, and patient cooperative  Airway & Oxygen Therapy: Patient Spontanous Breathing and Patient connected to nasal cannula oxygen  Post-op Assessment: Report given to RN, Post -op Vital signs reviewed and stable, and Patient moving all extremities X 4  Post vital signs: Reviewed and stable  Last Vitals:  Vitals Value Taken Time  BP 142/66 11/07/22 1545  Temp    Pulse 62 11/07/22 1551  Resp 16 11/07/22 1551  SpO2 100 % 11/07/22 1551  Vitals shown include unvalidated device data.  Last Pain:  Vitals:   11/07/22 1400  TempSrc:   PainSc: 8       Patients Stated Pain Goal: 5 (11/07/22 1317)  Complications: No notable events documented.

## 2022-11-07 NOTE — Brief Op Note (Signed)
11/07/2022  3:25 PM  PATIENT:  Marlowe Sax  67 y.o. male  PRE-OPERATIVE DIAGNOSIS:  Right shoulder rotator cuff arthropathy  POST-OPERATIVE DIAGNOSIS:  Right shoulder rotator cuff arthropathy  PROCEDURE:  Procedure(s) with comments: REVERSE SHOULDER ARTHROPLASTY (Right) - 115  SURGEON:  Surgeon(s) and Role:    * Aundria Rud, Noah Delaine, MD - Primary  PHYSICIAN ASSISTANT: Dion Saucier, PA-C  ANESTHESIA:   regional and general  EBL:  100 mL   BLOOD ADMINISTERED:none  DRAINS: none   LOCAL MEDICATIONS USED:  NONE  SPECIMEN:  No Specimen  DISPOSITION OF SPECIMEN:  N/A  COUNTS:  YES  TOURNIQUET:  * No tourniquets in log *  DICTATION: .Note written in EPIC  PLAN OF CARE: Discharge to home after PACU  PATIENT DISPOSITION:  PACU - hemodynamically stable.   Delay start of Pharmacological VTE agent (>24hrs) due to surgical blood loss or risk of bleeding: not applicable

## 2022-11-07 NOTE — Anesthesia Postprocedure Evaluation (Signed)
Anesthesia Post Note  Patient: [name]  Procedure(s) Performed: REVERSE SHOULDER ARTHROPLASTY (Right: Shoulder)     Patient location during evaluation: PACU Anesthesia Type: General Level of consciousness: awake and alert, patient cooperative and oriented Pain management: pain level controlled (pt more comfortable) Vital Signs Assessment: post-procedure vital signs reviewed and stable Respiratory status: spontaneous breathing, nonlabored ventilation and respiratory function stable Cardiovascular status: blood pressure returned to baseline and stable Postop Assessment: no apparent nausea or vomiting Anesthetic complications: no   No notable events documented.  Last Vitals:  Vitals:   11/07/22 1645 11/07/22 1700  BP: 112/61   Pulse: (!) 53 (!) 57  Resp: 10 10  Temp:    SpO2: 99% 99%    Last Pain:  Vitals:   11/07/22 1700  TempSrc:   PainSc: 8                  Tannie Koskela,E. Mattingly Fountaine

## 2022-11-07 NOTE — Anesthesia Procedure Notes (Signed)
Procedure Name: Intubation Date/Time: 11/07/2022 2:15 PM  Performed by: Shanon Payor, CRNAPre-anesthesia Checklist: Patient identified, Emergency Drugs available, Suction available, Patient being monitored and Timeout performed Patient Re-evaluated:Patient Re-evaluated prior to induction Oxygen Delivery Method: Circle system utilized Preoxygenation: Pre-oxygenation with 100% oxygen Induction Type: IV induction and Rapid sequence Laryngoscope Size: Mac and 3 Grade View: Grade I Tube type: Oral Tube size: 7.5 mm Number of attempts: 1 Airway Equipment and Method: Stylet Placement Confirmation: ETT inserted through vocal cords under direct vision, positive ETCO2, CO2 detector and breath sounds checked- equal and bilateral Secured at: 22 cm Tube secured with: Tape Dental Injury: Teeth and Oropharynx as per pre-operative assessment

## 2022-11-07 NOTE — Anesthesia Procedure Notes (Signed)
Anesthesia Procedure Image    

## 2022-11-08 DIAGNOSIS — M12811 Other specific arthropathies, not elsewhere classified, right shoulder: Secondary | ICD-10-CM | POA: Diagnosis not present

## 2022-11-08 LAB — BASIC METABOLIC PANEL
Anion gap: 8 (ref 5–15)
BUN: 12 mg/dL (ref 8–23)
CO2: 27 mmol/L (ref 22–32)
Calcium: 8.5 mg/dL — ABNORMAL LOW (ref 8.9–10.3)
Chloride: 103 mmol/L (ref 98–111)
Creatinine, Ser: 0.78 mg/dL (ref 0.61–1.24)
GFR, Estimated: >60 mL/min (ref >60)
Glucose, Bld: 123 mg/dL — ABNORMAL HIGH (ref 70–99)
Potassium: 4.2 mmol/L (ref 3.5–5.1)
Sodium: 138 mmol/L (ref 135–145)

## 2022-11-08 LAB — HEMOGLOBIN AND HEMATOCRIT, BLOOD
HCT: 39.7 % (ref 39.0–52.0)
Hemoglobin: 13.7 g/dL (ref 13.0–17.0)

## 2022-11-08 NOTE — Evaluation (Signed)
Occupational Therapy Evaluation Patient Details Name: JAYMERE VANBLARICOM MRN: [id number] DOB: 1955-08-10 Today's Date: 11/08/2022   History of Present Illness Patient is a 67 year old male who presented with right shoulder rotator cuff arthropathy. Patient underwent a right reverse shoulder arthoplasty. MVH:QIONGEX disorder, CHF, depression, anxiety, arthritis, hep B, hep C, seizures. L ORIF L elbow.   Clinical Impression   Patient is a 67 year old male who was admitted for above. Patient was living at home alone with large dog prior level. Currently, patient is mod A for UB dressing, min guard for transfers, max A for sling management with consistent cues for maintaining ROM restrictions. Patient plans to d/c home with sisters support. Other sister was called and scheduled a training session for this afternoon to review education and restrictions. Patient would continue to benefit from skilled OT services at this time while admitted and after d/c to address noted deficits in order to improve overall safety and independence in ADLs.       Recommendations for follow up therapy are one component of a multi-disciplinary discharge planning process, led by the attending physician.  Recommendations may be updated based on patient status, additional functional criteria and insurance authorization.   Assistance Recommended at Discharge    Patient can return home with the following A little help with walking and/or transfers;A little help with bathing/dressing/bathroom;Direct supervision/assist for financial management;Help with stairs or ramp for entrance;Assist for transportation;Direct supervision/assist for medications management;Assistance with cooking/housework    Functional Status Assessment  Patient has had a recent decline in their functional status and demonstrates the ability to make significant improvements in function in a reasonable and predictable amount of time.  Equipment Recommendations   None recommended by OT    Recommendations for Other Services       Precautions / Restrictions Precautions Precautions: Shoulder Type of Shoulder Precautions: shoulder ROM FF 0-90, no ABDuction, ER 0-30, Shoulder Interventions: Shoulder sling/immobilizer;Off for dressing/bathing/exercises;At all times Precaution Booklet Issued: Yes (comment) (handouts) Required Braces or Orthoses: Sling Restrictions Weight Bearing Restrictions: Yes RUE Weight Bearing: Partial weight bearing RUE Partial Weight Bearing Percentage or Pounds: 25 (per orders)      Mobility Bed Mobility Overal bed mobility: Needs Assistance Bed Mobility: Supine to Sit     Supine to sit: Mod assist     General bed mobility comments: HOB raised slightly with education on using recliner at home    Transfers                          Balance Overall balance assessment: Mild deficits observed, not formally tested             ADL either performed or assessed with clinical judgement   ADL Overall ADL's : Needs assistance/impaired Eating/Feeding: Set up;Sitting Eating/Feeding Details (indicate cue type and reason): block still in place limiting ability to use RUE and patient noted to have poor FMC with LUE opening small containers and management of tray items. Grooming: Set up;Sitting Grooming Details (indicate cue type and reason): noted to have resting tremor and active with movement with patient reporting that he has had it for the last two years with "doctors said it could be parkinsons but no one wants to do anything about it". patient poor FMC Upper Body Bathing: Moderate assistance;Sitting   Lower Body Bathing: Minimal assistance;Sitting/lateral leans;Sit to/from stand   Upper Body Dressing : Moderate assistance;Sitting Upper Body Dressing Details (indicate cue type and reason): with  education on how to maintain ROM restrictions. patient needed cues to stop abduction movements with all tasks.  attempted to move UE consistently with block still in place. Lower Body Dressing: Minimal assistance;Sit to/from stand Lower Body Dressing Details (indicate cue type and reason): educated on sitting to pull up pants v.s. standing to complete task. patient needed min A to pull up on R side with pullin gpants up over undergarments. Toilet Transfer: Min guard;Ambulation Toilet Transfer Details (indicate cue type and reason): no AD no LOB                 Vision   Additional Comments: noted to have some deficits with scanning tray table for items. easily distracted.            Pertinent Vitals/Pain Pain Assessment Pain Assessment: Faces Faces Pain Scale: Hurts a little bit Pain Location: generalized Pain Descriptors / Indicators: Discomfort Pain Intervention(s): Monitored during session     Hand Dominance Right   Extremity/Trunk Assessment Upper Extremity Assessment Upper Extremity Assessment: RUE deficits/detail;LUE deficits/detail RUE Deficits / Details: dominant UE. patient limited ROM per orders as noted above. block still in place, patient able to wiggle digits minimally no active ROM of wrist or elbow at this time LUE Deficits / Details: poor [facility]   Lower Extremity Assessment Lower Extremity Assessment: Overall WFL for tasks assessed   Cervical / Trunk Assessment Cervical / Trunk Assessment: Normal   Communication     Cognition Arousal/Alertness: Awake/alert Behavior During Therapy: WFL for tasks assessed/performed Overall Cognitive Status: Within Functional Limits for tasks assessed       General Comments: patient was noted to be hard of hearing at times. patient reporting " i cant read" during session. patients sister called to schedule education session.                Home Living Family/patient expects to be discharged to:: Private residence Living Arrangements: Alone Available Help at Discharge: Family;Neighbor;Available PRN/intermittently Type of  Home: Mobile home         Home Equipment: Gilmer Mor - single point;Shower seat;Grab bars - tub/shower;Grab bars - toilet          Prior Functioning/Environment Prior Level of Function : Independent/Modified Independent               ADLs Comments: has large dog at home        OT Problem List: Decreased activity tolerance;Decreased safety awareness;Decreased coordination;Impaired balance (sitting and/or standing);Decreased knowledge of use of DME or AE;Impaired UE functional use;Pain      OT Treatment/Interventions: Self-care/ADL training;Energy conservation;Therapeutic exercise;DME and/or AE instruction;Therapeutic activities;Patient/family education;Balance training    OT Goals(Current goals can be found in the care plan section) Acute Rehab OT Goals Patient Stated Goal: to go back home to dog OT Goal Formulation: With patient Time For Goal Achievement: 11/22/22 Potential to Achieve Goals: Fair  OT Frequency: Min 1X/week       AM-PAC OT "6 Clicks" Daily Activity     Outcome Measure Help from another person eating meals?: A Little Help from another person taking care of personal grooming?: A Little Help from another person toileting, which includes using toliet, bedpan, or urinal?: A Little Help from another person bathing (including washing, rinsing, drying)?: A Little Help from another person to put on and taking off regular upper body clothing?: A Little Help from another person to put on and taking off regular lower body clothing?: A Little 6 Click Score: 18   End of Session  Equipment Utilized During Treatment: Other (comment) (sling) Nurse Communication: Other (comment) (scheduled education session with patients sister later on this date.)  Activity Tolerance: Patient tolerated treatment well Patient left: in chair;with call bell/phone within reach  OT Visit Diagnosis: Unsteadiness on feet (R26.81);Other abnormalities of gait and mobility (R26.89);Pain Pain -  Right/Left: Right Pain - part of body: Shoulder                Time: 1610-9604 OT Time Calculation (min): 58 min Charges:  OT General Charges $OT Visit: 1 Visit OT Evaluation $OT Eval Low Complexity: 1 Low OT Treatments $Self Care/Home Management : 38-52 mins  Azusena Erlandson OTR/L, MS Acute Rehabilitation Department Office# [phone]   Selinda Flavin 11/08/2022, 9:21 AM

## 2022-11-08 NOTE — Progress Notes (Signed)
  Transition of Care [facility]) Screening Note   Patient Details  Name: [name] Date of Birth: 1955/06/20   Transition of Care [facility]) CM/SW Contact:    Adrian Prows, RN Phone Number: 11/08/2022, 9:18 AM    Transition of Care Department [facility]) has reviewed patient and no TOC needs have been identified at this time. We will continue to monitor patient advancement through interdisciplinary progression rounds. If new patient transition needs arise, please place a TOC consult.
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
## 2022-11-08 NOTE — Progress Notes (Signed)
   Subjective: 1 Day Post-Op Procedure(s) (LRB): REVERSE SHOULDER ARTHROPLASTY (Right)  Pt doing well this morning No pain and shoulder block still working Denies any new symptoms or issues overnight Patient reports pain as none.  Objective:   VITALS:   Vitals:   11/08/22 0145 11/08/22 0524  BP: (!) 111/92 99/66  Pulse: 61 (!) 51  Resp: 17 15  Temp: (!) 97.5 F (36.4 C) (!) 97.5 F (36.4 C)  SpO2: 97% 97%    Right shoulder: incision healing well Nv intact distally No rashes or edema distally Sling in good position  LABS Recent Labs    11/07/22 1701 11/08/22 0339  HGB 13.4 13.7  HCT 39.6 39.7  WBC 2.5*  --   PLT 46*  --     Recent Labs    11/07/22 1701 11/08/22 0339  NA  --  138  K  --  4.2  BUN  --  12  CREATININE 0.77 0.78  GLUCOSE  --  123*     Assessment/Plan: 1 Day Post-Op Procedure(s) (LRB): REVERSE SHOULDER ARTHROPLASTY (Right) D/c home today F/u in 2 weeks in the office Pain management as needed   Elizebeth Koller, MPAS [facility] is now [employer] [facility] [address], [place], [place] [address] Phone: [phone] [email or web address] Facebook  [employer]

## 2022-11-08 NOTE — Progress Notes (Signed)
Occupational Therapy Treatment Patient Details Name: [name] MRN: [id number] DOB: 1956-03-09 Today's Date: 11/08/2022   History of present illness Patient is a 67 year old male who presented with right shoulder rotator cuff arthropathy. Patient underwent a right reverse shoulder arthoplasty. JXB:JYNWGNF disorder, CHF, depression, anxiety, arthritis, hep B, hep C, seizures. L ORIF L elbow.   OT comments  s/p shoulder replacement without functional use of right dominant upper extremity secondary to effects of surgery and interscalene block and shoulder precautions. Therapist provided education and instruction to patient and sister in regards to exercises, precautions, positioning, donning upper extremity clothing and bathing while maintaining shoulder precautions, ice and edema management and donning/doffing sling. Patient and sister verbalized understanding and demonstrated as needed. Patient needed assistance to donn shirt, underwear, pants, socks and shoes and provided with instruction on compensatory strategies to perform ADLs. Patient to follow up with MD for further therapy needs.     Recommendations for follow up therapy are one component of a multi-disciplinary discharge planning process, led by the attending physician.  Recommendations may be updated based on patient status, additional functional criteria and insurance authorization.    Assistance Recommended at Discharge Frequent or constant Supervision/Assistance  Patient can return home with the following  A little help with walking and/or transfers;A little help with bathing/dressing/bathroom;Direct supervision/assist for financial management;Help with stairs or ramp for entrance;Assist for transportation;Direct supervision/assist for medications management;Assistance with cooking/housework   Equipment Recommendations  None recommended by OT       Precautions / Restrictions Precautions Precautions: Shoulder Type of Shoulder  Precautions: shoulder ROM FF 0-90, no ABDuction, ER 0-30, Shoulder Interventions: Shoulder sling/immobilizer;Off for dressing/bathing/exercises;At all times Precaution Booklet Issued: Yes (comment) (handouts) Required Braces or Orthoses: Sling Restrictions Weight Bearing Restrictions: Yes RUE Weight Bearing: Partial weight bearing RUE Partial Weight Bearing Percentage or Pounds: 25 (per orders)       Mobility Bed Mobility Overal bed mobility: Needs Assistance Bed Mobility: Supine to Sit     Supine to sit: Mod assist     General bed mobility comments: HOB raised slightly with education on using recliner at home          Balance Overall balance assessment: Mild deficits observed, not formally tested           ADL either performed or assessed with clinical judgement      Cognition Arousal/Alertness: Awake/alert Behavior During Therapy: WFL for tasks assessed/performed Overall Cognitive Status: Within Functional Limits for tasks assessed         General Comments: patient's sister was present during session this afternoon           Shoulder Instructions Shoulder Instructions Donning/doffing shirt without moving shoulder: Supervision/safety;Caregiver independent with task Method for sponge bathing under operated UE: Supervision/safety;Caregiver independent with task Donning/doffing sling/immobilizer: Caregiver independent with task Correct positioning of sling/immobilizer: Caregiver independent with task;Supervision/safety ROM for elbow, wrist and digits of operated UE: Modified independent Proper positioning of operated UE when showering: Caregiver independent with task Positioning of UE while sleeping: Caregiver independent with task          Pertinent Vitals/ Pain       Pain Assessment Pain Assessment: Faces Faces Pain Scale: Hurts a little bit Pain Location: generalized Pain Descriptors / Indicators: Discomfort Pain Intervention(s): Monitored during  session, Limited activity within patient's tolerance         Frequency  Min 1X/week        Progress Toward Goals  OT Goals(current goals can now  be found in the care plan section)  Progress towards OT goals: Progressing toward goals     Plan Discharge plan remains appropriate       AM-PAC OT "6 Clicks" Daily Activity     Outcome Measure   Help from another person eating meals?: None Help from another person taking care of personal grooming?: A Little Help from another person toileting, which includes using toliet, bedpan, or urinal?: A Little Help from another person bathing (including washing, rinsing, drying)?: A Little Help from another person to put on and taking off regular upper body clothing?: A Little Help from another person to put on and taking off regular lower body clothing?: A Little 6 Click Score: 19    End of Session Equipment Utilized During Treatment: Other (comment)  OT Visit Diagnosis: Unsteadiness on feet (R26.81);Other abnormalities of gait and mobility (R26.89);Pain Pain - Right/Left: Right Pain - part of body: Shoulder   Activity Tolerance Patient tolerated treatment well   Patient Left in chair;with call bell/phone within reach   Nurse Communication Other (comment)        Time: 9604-5409 OT Time Calculation (min): 48 min  Charges: OT General Charges $OT Visit: 1 Visit OT Treatments $Self Care/Home Management : 38-52 mins  Rosalio Loud, MS Acute Rehabilitation Department Office# [phone]   Selinda Flavin 11/08/2022, 2:18 PM

## 2022-11-08 NOTE — Progress Notes (Signed)
Pt and sister were given d/c instructions and reinforced post-op teaching. All questions answered. Pt taken via wheelchair to front entrance for d/c.

## 2022-11-10 ENCOUNTER — Telehealth: Payer: Self-pay

## 2022-11-10 NOTE — Transitions of Care (Post Inpatient/ED Visit) (Signed)
11/10/2022  Name: [name] MRN: [id number] DOB: 1956/05/29  Today's TOC FU Call Status: Today's TOC FU Call Status:: Successful TOC FU Call Competed TOC FU Call Complete Date: 11/10/22  Transition Care Management Follow-up Telephone Call Date of Discharge: 11/08/22 Discharge Facility: [facility]) Type of Discharge: Inpatient Admission Primary Inpatient Discharge Diagnosis:: right shoulder replacement How have you been since you were released from the hospital?: Better Any questions or concerns?: No  Items Reviewed: Did you receive and understand the discharge instructions provided?: Yes Any new allergies since your discharge?: No Dietary orders reviewed?: Yes Do you have support at home?: Yes People in Home: sibling(s)  Medications Reviewed Today: Medications Reviewed Today     Reviewed by Karena Addison, LPN (Licensed Practical Nurse) on 11/10/22 at 1153  Med List Status: <None>   Medication Order Taking? Sig Documenting Provider Last Dose Status Informant  albuterol (PROVENTIL) (2.5 MG/3ML) 0.083% nebulizer solution [id number] Yes Take 3 mLs (2.5 mg total) by nebulization every 6 (six) hours as needed for wheezing or shortness of breath. Mayer Masker, PA-C Taking Active Self  albuterol (VENTOLIN HFA) 108 (90 Base) MCG/ACT inhaler [id number] Yes Inhale 2 puffs into the lungs every 6 (six) hours as needed for wheezing or shortness of breath. Carlean Jews, NP Taking Active Self  Ascorbic Acid (VITAMIN C) 1000 MG tablet [id number] No Take 1,000 mg by mouth daily.  Patient not taking: Reported on 11/10/2022   [provider] Not Taking Active Self           Med Note (SATTERFIELD, Genoveva Ill   Tue Nov 04, 2022  2:34 PM) ON HOLD  atorvastatin (LIPITOR) 20 MG tablet [id number] Yes TAKE 1 TABLET BY MOUTH AT BEDTIME. Carlean Jews, NP Taking Active Self  budesonide-formoterol (SYMBICORT) 80-4.5 MCG/ACT inhaler [id number] Yes Inhale 2 puffs into the lungs 2  (two) times daily.  Patient taking differently: Inhale 2 puffs into the lungs every 4 (four) hours.   Carlean Jews, NP Taking Active Self  busPIRone (BUSPAR) 15 MG tablet [id number] Yes Take 15 mg by mouth 3 (three) times daily. [provider] Taking Active Self  divalproex (DEPAKOTE) 500 MG DR tablet [id number] Yes Take 500 mg by mouth 3 (three) times daily. [provider] Taking Active Self  hydrOXYzine (ATARAX) 10 MG tablet [id number] No Take 1 tablet (10 mg total) by mouth 3 (three) times daily as needed.  Patient not taking: Reported on 11/10/2022   Carlean Jews, NP Not Taking Active Self           Med Note Christena Flake Sep 18, 2022  3:56 PM) Has not started  lactulose [facility]) 10 GM/15ML solution [id number] Yes Take 30 mLs (20 g total) by mouth 2 (two) times daily. Hold after 2 loose BM every day Rolly Salter, MD Taking Active Self           Med Note Lenoria Farrier   Thu Sep 18, 2022  4:00 PM)    lisinopril (ZESTRIL) 5 MG tablet [id number] Yes Take 1 tablet (5 mg total) by mouth daily. Carlean Jews, NP Taking Active Self  methocarbamol (ROBAXIN-750) 750 MG tablet [id number] Yes Take 1 tablet (750 mg total) by mouth every 8 (eight) hours as needed for muscle spasms. Yolonda Kida, MD Taking Active   metoprolol succinate (TOPROL-XL) 25 MG 24 hr tablet [id number] Yes Take 0.5 tablets (12.5 mg total) by mouth daily. Carlean Jews, NP Taking Active  Self  nitroGLYCERIN (NITROSTAT) 0.4 MG SL tablet [id number] Yes Place 1 tablet (0.4 mg total) under the tongue every 5 (five) minutes as needed for chest pain. Carlean Jews, NP Taking Active Self  ondansetron (ZOFRAN) 4 MG tablet [id number] Yes Take 1 tablet (4 mg total) by mouth every 8 (eight) hours as needed for vomiting or nausea. Yolonda Kida, MD Taking Active   oxcarbazepine (TRILEPTAL) 600 MG tablet [id number] Yes Take 600 mg by mouth 2 (two) times daily. [provider] Taking Active Self           Med Note Antony Madura, Arn Medal   Wed Dec 25, 2021  7:58 PM)    oxybutynin (DITROPAN-XL) 10 MG 24 hr tablet [id number] Yes Take 1 tablet (10 mg total) by mouth at bedtime. Carlean Jews, NP Taking Active   oxyCODONE (ROXICODONE) 5 MG immediate release tablet [id number] Yes Take 1 tablet (5 mg total) by mouth every 6 (six) hours as needed for severe pain or moderate pain. Yolonda Kida, MD Taking Active   prazosin (MINIPRESS) 1 MG capsule [id number] Yes Take 1 mg by mouth every morning. [provider] Taking Active Self  QUEtiapine (SEROQUEL XR) 200 MG 24 hr tablet [id number] Yes Take 200 mg by mouth at bedtime. [provider] Taking Active Self  senna-docusate (SENOKOT-S) 8.6-50 MG tablet [id number] Yes Take 1 tablet by mouth at bedtime as needed for moderate constipation. Dimple Nanas, MD Taking Active Self  sertraline (ZOLOFT) 100 MG tablet [id number] Yes Take 200 mg by mouth in the morning. [provider] Taking Active Self  Suvorexant (BELSOMRA) 15 MG TABS [id number] Yes Take 1 tablet by mouth at bedtime as needed.  Patient taking differently: Take 1 tablet by mouth in the morning, at noon, and at bedtime.   Carlean Jews, NP Taking Active Self           Med Note (SATTERFIELD, Genoveva Ill   Tue Nov 04, 2022  2:45 PM) Patient verified he is taking 3 times a day   traZODone (DESYREL) 100 MG tablet [id number] Yes Take 200 mg by mouth at bedtime as needed for sleep. [provider] Taking Active Self  Med List Note Salvatore Marvel, CPhT 12/25/21 2008): Father's number is [phone]            Home Care and Equipment/Supplies: Were Home Health Services Ordered?: NA Any new equipment or medical supplies ordered?: NA  Functional Questionnaire: Do you need assistance with bathing/showering or dressing?: No Do you need assistance with meal preparation?: No Do you need assistance with eating?: No Do you have  difficulty maintaining continence: No Do you need assistance with getting out of bed/getting out of a chair/moving?: No Do you have difficulty managing or taking your medications?: No  Follow up appointments reviewed: PCP Follow-up appointment confirmed?: NA Specialist Hospital Follow-up appointment confirmed?: Yes Date of Specialist follow-up appointment?: 11/24/22 Follow-Up Specialty Provider:: Dr Aundria Rud Do you need transportation to your follow-up appointment?: No Do you understand care options if your condition(s) worsen?: Yes-patient verbalized understanding    SIGNATURE Karena Addison, LPN [facility] Nurse Health Advisor Direct Dial [phone]

## 2022-11-12 ENCOUNTER — Encounter (HOSPITAL_COMMUNITY): Payer: Self-pay | Admitting: Orthopedic Surgery

## 2022-11-19 ENCOUNTER — Ambulatory Visit: Payer: 59 | Admitting: Nurse Practitioner

## 2022-11-19 NOTE — Progress Notes (Deleted)
Established patient visit   Patient: [name]   DOB: Nov 02, 1955   67 y.o. Male  MRN: [id number] Visit Date: 11/19/2022   No chief complaint on file.  Subjective    HPI  Follow up  -recent shoulder replacement surgery -trial oxybutynin ER 5 mg for overactive bladder -maintenance inhalers and rescue inhalers given for COPD  -sees neurology for seizure disorder   Medications: Outpatient Medications Prior to Visit  Medication Sig   albuterol (PROVENTIL) (2.5 MG/3ML) 0.083% nebulizer solution Take 3 mLs (2.5 mg total) by nebulization every 6 (six) hours as needed for wheezing or shortness of breath.   albuterol (VENTOLIN HFA) 108 (90 Base) MCG/ACT inhaler Inhale 2 puffs into the lungs every 6 (six) hours as needed for wheezing or shortness of breath.   Ascorbic Acid (VITAMIN C) 1000 MG tablet Take 1,000 mg by mouth daily. (Patient not taking: Reported on 11/10/2022)   atorvastatin (LIPITOR) 20 MG tablet TAKE 1 TABLET BY MOUTH AT BEDTIME.   budesonide-formoterol (SYMBICORT) 80-4.5 MCG/ACT inhaler Inhale 2 puffs into the lungs 2 (two) times daily. (Patient taking differently: Inhale 2 puffs into the lungs every 4 (four) hours.)   busPIRone (BUSPAR) 15 MG tablet Take 15 mg by mouth 3 (three) times daily.   divalproex (DEPAKOTE) 500 MG DR tablet Take 500 mg by mouth 3 (three) times daily.   hydrOXYzine (ATARAX) 10 MG tablet Take 1 tablet (10 mg total) by mouth 3 (three) times daily as needed. (Patient not taking: Reported on 11/10/2022)   lactulose (CHRONULAC) 10 GM/15ML solution Take 30 mLs (20 g total) by mouth 2 (two) times daily. Hold after 2 loose BM every day   lisinopril (ZESTRIL) 5 MG tablet Take 1 tablet (5 mg total) by mouth daily.   methocarbamol (ROBAXIN-750) 750 MG tablet Take 1 tablet (750 mg total) by mouth every 8 (eight) hours as needed for muscle spasms.   metoprolol succinate (TOPROL-XL) 25 MG 24 hr tablet Take 0.5 tablets (12.5 mg total) by mouth daily.   nitroGLYCERIN  (NITROSTAT) 0.4 MG SL tablet Place 1 tablet (0.4 mg total) under the tongue every 5 (five) minutes as needed for chest pain.   ondansetron (ZOFRAN) 4 MG tablet Take 1 tablet (4 mg total) by mouth every 8 (eight) hours as needed for vomiting or nausea.   oxcarbazepine (TRILEPTAL) 600 MG tablet Take 600 mg by mouth 2 (two) times daily.   oxybutynin (DITROPAN-XL) 10 MG 24 hr tablet Take 1 tablet (10 mg total) by mouth at bedtime.   oxyCODONE (ROXICODONE) 5 MG immediate release tablet Take 1 tablet (5 mg total) by mouth every 6 (six) hours as needed for severe pain or moderate pain.   prazosin (MINIPRESS) 1 MG capsule Take 1 mg by mouth every morning.   QUEtiapine (SEROQUEL XR) 200 MG 24 hr tablet Take 200 mg by mouth at bedtime.   senna-docusate (SENOKOT-S) 8.6-50 MG tablet Take 1 tablet by mouth at bedtime as needed for moderate constipation.   sertraline (ZOLOFT) 100 MG tablet Take 200 mg by mouth in the morning.   Suvorexant (BELSOMRA) 15 MG TABS Take 1 tablet by mouth at bedtime as needed. (Patient taking differently: Take 1 tablet by mouth in the morning, at noon, and at bedtime.)   traZODone (DESYREL) 100 MG tablet Take 200 mg by mouth at bedtime as needed for sleep.   No facility-administered medications prior to visit.    Review of Systems  {Labs (Optional):23779}   Objective    There  were no vitals filed for this visit. There is no height or weight on file to calculate BMI.  BP Readings from Last 3 Encounters:  11/08/22 95/62  11/04/22 (Abnormal) 146/92  09/25/22 102/66    Wt Readings from Last 3 Encounters:  11/07/22 168 lb (76.2 kg)  11/04/22 168 lb (76.2 kg)  09/25/22 183 lb (83 kg)    Physical Exam  ***  No results found for any visits on 11/19/22.  Assessment & Plan    There are no diagnoses linked to this encounter.   No follow-ups on file.         Carlean Jews, NP  [facility] [phone] (phone) [phone]  (fax)  [facility]

## 2022-11-26 DIAGNOSIS — Z4789 Encounter for other orthopedic aftercare: Secondary | ICD-10-CM | POA: Insufficient documentation

## 2022-11-28 ENCOUNTER — Other Ambulatory Visit: Payer: Self-pay | Admitting: Adult Health

## 2022-11-28 ENCOUNTER — Other Ambulatory Visit: Payer: Self-pay | Admitting: Nurse Practitioner

## 2022-11-28 DIAGNOSIS — N3281 Overactive bladder: Secondary | ICD-10-CM

## 2022-12-02 ENCOUNTER — Other Ambulatory Visit: Payer: Self-pay | Admitting: Nurse Practitioner

## 2022-12-02 DIAGNOSIS — E785 Hyperlipidemia, unspecified: Secondary | ICD-10-CM

## 2022-12-02 NOTE — Telephone Encounter (Signed)
LVM for return call for appt  

## 2022-12-02 NOTE — Telephone Encounter (Signed)
Last seen on 07/03/22  Follow up scheduled on 01/07/23 Last filled on 09/01/22 #270 tablets( 90 day supply)

## 2022-12-05 ENCOUNTER — Other Ambulatory Visit: Payer: Self-pay | Admitting: Nurse Practitioner

## 2022-12-05 DIAGNOSIS — F411 Generalized anxiety disorder: Secondary | ICD-10-CM

## 2022-12-29 ENCOUNTER — Other Ambulatory Visit: Payer: Self-pay

## 2022-12-29 ENCOUNTER — Emergency Department (HOSPITAL_COMMUNITY)
Admission: EM | Admit: 2022-12-29 | Discharge: 2022-12-29 | Disposition: A | Discharge status: Home / Self Care | Payer: 59 | Attending: Emergency Medicine | Admitting: Emergency Medicine

## 2022-12-29 ENCOUNTER — Emergency Department (HOSPITAL_COMMUNITY): Payer: 59

## 2022-12-29 DIAGNOSIS — D649 Anemia, unspecified: Secondary | ICD-10-CM | POA: Insufficient documentation

## 2022-12-29 DIAGNOSIS — R001 Bradycardia, unspecified: Secondary | ICD-10-CM | POA: Diagnosis not present

## 2022-12-29 DIAGNOSIS — M25511 Pain in right shoulder: Secondary | ICD-10-CM | POA: Insufficient documentation

## 2022-12-29 DIAGNOSIS — Z96611 Presence of right artificial shoulder joint: Secondary | ICD-10-CM | POA: Diagnosis not present

## 2022-12-29 DIAGNOSIS — R42 Dizziness and giddiness: Secondary | ICD-10-CM | POA: Diagnosis present

## 2022-12-29 DIAGNOSIS — Z79899 Other long term (current) drug therapy: Secondary | ICD-10-CM | POA: Insufficient documentation

## 2022-12-29 LAB — BASIC METABOLIC PANEL
Anion gap: 4 — ABNORMAL LOW (ref 5–15)
BUN: 21 mg/dL (ref 8–23)
CO2: 23 mmol/L (ref 22–32)
Calcium: 7.9 mg/dL — ABNORMAL LOW (ref 8.9–10.3)
Chloride: 108 mmol/L (ref 98–111)
Creatinine, Ser: 0.56 mg/dL — ABNORMAL LOW (ref 0.61–1.24)
GFR, Estimated: >60 mL/min (ref >60)
Glucose, Bld: 88 mg/dL (ref 70–99)
Potassium: 4.1 mmol/L (ref 3.5–5.1)
Sodium: 135 mmol/L (ref 135–145)

## 2022-12-29 LAB — CBC
HCT: 34.1 % — ABNORMAL LOW (ref 39.0–52.0)
Hemoglobin: 11.4 g/dL — ABNORMAL LOW (ref 13.0–17.0)
MCH: 32.9 pg (ref 26.0–34.0)
MCHC: 33.4 g/dL (ref 30.0–36.0)
MCV: 98.6 fL (ref 80.0–100.0)
Platelets: 60 10*3/uL — ABNORMAL LOW (ref 150–400)
RBC: 3.46 MIL/uL — ABNORMAL LOW (ref 4.22–5.81)
RDW: 15 % (ref 11.5–15.5)
WBC: 2.4 10*3/uL — ABNORMAL LOW (ref 4.0–10.5)
nRBC: 0 % (ref 0.0–0.2)

## 2022-12-29 LAB — URINALYSIS, ROUTINE W REFLEX MICROSCOPIC
Bilirubin Urine: NEGATIVE
Glucose, UA: NEGATIVE mg/dL
Hgb urine dipstick: NEGATIVE
Ketones, ur: NEGATIVE mg/dL
Leukocytes,Ua: NEGATIVE
Nitrite: NEGATIVE
Protein, ur: NEGATIVE mg/dL
Specific Gravity, Urine: 1.02 (ref 1.005–1.030)
pH: 5 (ref 5.0–8.0)

## 2022-12-29 LAB — RAPID URINE DRUG SCREEN, HOSP PERFORMED
Amphetamines: NOT DETECTED
Barbiturates: NOT DETECTED
Benzodiazepines: NOT DETECTED
Cocaine: NOT DETECTED
Opiates: NOT DETECTED
Tetrahydrocannabinol: POSITIVE — AB

## 2022-12-29 LAB — POC OCCULT BLOOD, ED: Fecal Occult Bld: NEGATIVE

## 2022-12-29 LAB — CBG MONITORING, ED: Glucose-Capillary: 98 mg/dL (ref 70–99)

## 2022-12-29 MED ORDER — SODIUM CHLORIDE 0.9 % IV BOLUS
1000.0000 mL | Freq: Once | INTRAVENOUS | Status: AC
  Administered 2022-12-29: 1000 mL via INTRAVENOUS

## 2022-12-29 NOTE — ED Provider Notes (Signed)
EMERGENCY DEPARTMENT AT [facility] Provider Note   CSN: [id number] Arrival date & time: 12/29/22  1423     History  Chief Complaint  Patient presents with   Dizziness   Shoulder Pain    [name] is a 67 y.o. male.  67 year old male with prior medical history detailed below presents for evaluation.  Patient reports that he a health aide came to his house and checked on him this morning.  Apparently the aide noticed that his blood pressure was low and referred him to the ED for evaluation.  Patient is currently without complaint.  Patient denies current dizziness, nausea, vomiting, chest pain, shortness of breath, fever.  He reports that he thinks that the health aide may have been new and may have made a mistake.  The history is provided by the patient and medical records.  Shoulder Pain      Home Medications Prior to Admission medications   Medication Sig Start Date End Date Taking? Authorizing Provider  albuterol (PROVENTIL) (2.5 MG/3ML) 0.083% nebulizer solution Take 3 mLs (2.5 mg total) by nebulization every 6 (six) hours as needed for wheezing or shortness of breath. 07/18/20   Mayer Masker, PA-C  albuterol (VENTOLIN HFA) 108 (90 Base) MCG/ACT inhaler Inhale 2 puffs into the lungs every 6 (six) hours as needed for wheezing or shortness of breath. 09/08/22   Carlean Jews, NP  Ascorbic Acid (VITAMIN C) 1000 MG tablet Take 1,000 mg by mouth daily. Patient not taking: Reported on 11/10/2022    [provider]  atorvastatin (LIPITOR) 20 MG tablet TAKE 1 TABLET BY MOUTH AT BEDTIME. 12/02/22   Carlean Jews, NP  budesonide-formoterol (SYMBICORT) 80-4.5 MCG/ACT inhaler Inhale 2 puffs into the lungs 2 (two) times daily. Patient taking differently: Inhale 2 puffs into the lungs every 4 (four) hours. 09/08/22   Carlean Jews, NP  busPIRone (BUSPAR) 15 MG tablet Take 15 mg by mouth 3 (three) times daily.    [provider]   divalproex (DEPAKOTE) 500 MG DR tablet TAKE 1 TABLET BY MOUTH 3 TIMES DAILY. 12/02/22   Butch Penny, NP  hydrOXYzine (ATARAX) 10 MG tablet Take 1 tablet (10 mg total) by mouth 3 (three) times daily as needed. Patient not taking: Reported on 11/10/2022 09/08/22   Carlean Jews, NP  lactulose (CHRONULAC) 10 GM/15ML solution Take 30 mLs (20 g total) by mouth 2 (two) times daily. Hold after 2 loose BM every day 12/27/21   Rolly Salter, MD  lisinopril (ZESTRIL) 5 MG tablet Take 1 tablet (5 mg total) by mouth daily. 09/08/22   Carlean Jews, NP  methocarbamol (ROBAXIN-750) 750 MG tablet Take 1 tablet (750 mg total) by mouth every 8 (eight) hours as needed for muscle spasms. 11/07/22   Yolonda Kida, MD  metoprolol succinate (TOPROL-XL) 25 MG 24 hr tablet Take 0.5 tablets (12.5 mg total) by mouth daily. 09/08/22   Carlean Jews, NP  nitroGLYCERIN (NITROSTAT) 0.4 MG SL tablet Place 1 tablet (0.4 mg total) under the tongue every 5 (five) minutes as needed for chest pain. 09/08/22   Carlean Jews, NP  ondansetron (ZOFRAN) 4 MG tablet Take 1 tablet (4 mg total) by mouth every 8 (eight) hours as needed for vomiting or nausea. 11/07/22   Yolonda Kida, MD  oxcarbazepine (TRILEPTAL) 600 MG tablet Take 600 mg by mouth 2 (two) times daily.    [provider]  oxybutynin (DITROPAN-XL) 10 MG 24  hr tablet Take 1 tablet (10 mg total) by mouth at bedtime. 09/30/22   Carlean Jews, NP  oxyCODONE (ROXICODONE) 5 MG immediate release tablet Take 1 tablet (5 mg total) by mouth every 6 (six) hours as needed for severe pain or moderate pain. 11/07/22 11/07/23  Yolonda Kida, MD  prazosin (MINIPRESS) 1 MG capsule Take 1 mg by mouth every morning.    [provider]  QUEtiapine (SEROQUEL XR) 200 MG 24 hr tablet Take 200 mg by mouth at bedtime. 07/27/18   [provider]  senna-docusate (SENOKOT-S) 8.6-50 MG tablet Take 1 tablet by mouth at bedtime as needed for  moderate constipation. 01/01/22   Amin, Loura Halt, MD  sertraline (ZOLOFT) 100 MG tablet Take 200 mg by mouth in the morning.    [provider]  Suvorexant (BELSOMRA) 15 MG TABS Take 1 tablet by mouth at bedtime as needed. Patient taking differently: Take 1 tablet by mouth in the morning, at noon, and at bedtime. 12/19/21   Carlean Jews, NP  traZODone (DESYREL) 100 MG tablet Take 200 mg by mouth at bedtime as needed for sleep. 12/04/21   [provider]      Allergies    Patient has no known allergies.    Review of Systems   Review of Systems  All other systems reviewed and are negative.   Physical Exam Updated Vital Signs BP 107/68   Pulse (!) 51   Temp 97.6 F (36.4 C) (Oral)   Resp 18   Ht 5\' 8"  (1.727 m)   Wt 68 kg   SpO2 95%   BMI 22.81 kg/m  Physical Exam Vitals and nursing note reviewed.  Constitutional:      General: He is not in acute distress.    Appearance: Normal appearance. He is well-developed.  HENT:     Head: Normocephalic and atraumatic.  Eyes:     Conjunctiva/sclera: Conjunctivae normal.     Pupils: Pupils are equal, round, and reactive to light.  Cardiovascular:     Rate and Rhythm: Normal rate and regular rhythm.     Heart sounds: Normal heart sounds.  Pulmonary:     Effort: Pulmonary effort is normal. No respiratory distress.     Breath sounds: Normal breath sounds.  Abdominal:     General: There is no distension.     Palpations: Abdomen is soft.     Tenderness: There is no abdominal tenderness.  Genitourinary:    Comments: Brown stool present on DRE.  No gross blood. Musculoskeletal:        General: No deformity. Normal range of motion.     Cervical back: Normal range of motion and neck supple.  Skin:    General: Skin is warm and dry.  Neurological:     General: No focal deficit present.     Mental Status: He is alert and oriented to person, place, and time.     ED Results / Procedures / Treatments   Labs (all  labs ordered are listed, but only abnormal results are displayed) Labs Reviewed  BASIC METABOLIC PANEL - Abnormal; Notable for the following components:      Result Value   Creatinine, Ser 0.56 (*)    Calcium 7.9 (*)    Anion gap 4 (*)    All other components within normal limits  CBC - Abnormal; Notable for the following components:   WBC 2.4 (*)    RBC 3.46 (*)    Hemoglobin 11.4 (*)  HCT 34.1 (*)    Platelets 60 (*)    All other components within normal limits  RAPID URINE DRUG SCREEN, HOSP PERFORMED - Abnormal; Notable for the following components:   Tetrahydrocannabinol POSITIVE (*)    All other components within normal limits  URINALYSIS, ROUTINE W REFLEX MICROSCOPIC  CBG MONITORING, ED  POC OCCULT BLOOD, ED    EKG EKG Interpretation Date/Time:  Monday December 29 2022 17:46:47 EDT Ventricular Rate:  47 PR Interval:  152 QRS Duration:  102 QT Interval:  463 QTC Calculation: 410 R Axis:   70  Text Interpretation: Sinus bradycardia Posterior infarct, old Confirmed by Kristine Royal [phone]) on 12/29/2022 5:50:41 PM  Radiology DG Chest Port 1 View  Result Date: 12/29/2022 CLINICAL DATA:  Weakness. EXAM: PORTABLE CHEST 1 VIEW COMPARISON:  Chest radiograph dated June 26, 2022 FINDINGS: The heart size and mediastinal contours are within normal limits. Both lungs are clear. Status post right shoulder reverse arthroplasty. Thoracic spondylosis. IMPRESSION: No active disease. Electronically Signed   By: Larose Hires D.O.   On: 12/29/2022 15:35   DG Shoulder Right  Result Date: 12/29/2022 CLINICAL DATA:  Right shoulder pain for a month. Limited range of motion. EXAM: RIGHT SHOULDER - 2+ VIEW COMPARISON:  Radiographs dated January 08, 2022 FINDINGS: Status post right shoulder reverse arthroplasty with intact hardware. No periprosthetic fracture. Mild acromioclavicular osteoarthritis. Right lung is clear. IMPRESSION: Status post right shoulder reverse arthroplasty without evidence of  complication. Electronically Signed   By: Larose Hires D.O.   On: 12/29/2022 15:34    Procedures Procedures    Medications Ordered in ED Medications  sodium chloride 0.9 % bolus 1,000 mL (0 mLs Intravenous Stopped 12/29/22 1829)    ED Course/ Medical Decision Making/ A&P                             Medical Decision Making Amount and/or Complexity of Data Reviewed Labs: ordered. Radiology: ordered.    Medical Screen Complete  This patient presented to the ED with complaint of apparent transient hypotension.  This complaint involves an extensive number of treatment options. The initial differential diagnosis includes, but is not limited to, metabolic abnormality, dehydration, anemia, AKI, etc.  This presentation is: Acute, Chronic, Self-Limited, Previously Undiagnosed, Uncertain Prognosis, Complicated, Systemic Symptoms, and Threat to Life/Bodily Function  Patient referred to the ED for evaluation of transient hypotension.  Patient is asymptomatic.  Screening labs obtained are without significant acute abnormality.    Patient is mildly anemic with hemoglobin 11.4 and thrombocytopenic with a platelet count of 60.  These appear to be chronic issues.  Patient's hemoglobin is slightly less than prior.  Patient denies any GI bleeding.  Hemoccult today is negative.   After ED evaluation the patient remains asymptomatic.  He desires discharge home.  He does understand need for close outpatient follow-up.  Strict return precautions given and understood. Additional history obtained: External records from outside sources obtained and reviewed including prior ED visits and prior Inpatient records.    Lab Tests:  I ordered and personally interpreted labs.   Imaging Studies ordered:  I ordered imaging studies including chest x-ray, plain films of right shoulder I independently visualized and interpreted obtained imaging which showed NAD I agree with the radiologist  interpretation.   Cardiac Monitoring:  The patient was maintained on a cardiac monitor.  I personally viewed and interpreted the cardiac monitor which showed an underlying rhythm of: NSR  Medicines ordered:  I ordered medication including IV fluids for suspected dehydration Reevaluation of the patient after these medicines showed that the patient: improved   Problem List / ED Course:  Transient hypotension   Reevaluation:  After the interventions noted above, I reevaluated the patient and found that they have: improved   Disposition:  After consideration of the diagnostic results and the patients response to treatment, I feel that the patent would benefit from close outpatient follow-up.          Final Clinical Impression(s) / ED Diagnoses Final diagnoses:  Dizziness    Rx / DC Orders ED Discharge Orders     None         Wynetta Fines, MD 12/29/22 [phone]

## 2022-12-29 NOTE — Discharge Instructions (Signed)
Return for any problem.  ?

## 2022-12-29 NOTE — ED Triage Notes (Signed)
Pt BIBA from home. C/o R shoulder pain a month ago. Limited ROM. When EMS arrived pt was orthostatic, w/systolic in 80s.Pt reports intermittent periods of dizziness for several weeks.  Given 600 NS  Aox4  BP: 102/70 HR: 45 SPO2: 98 RA CBG: 92

## 2023-01-02 ENCOUNTER — Other Ambulatory Visit: Payer: Self-pay | Admitting: Nurse Practitioner

## 2023-01-02 DIAGNOSIS — J439 Emphysema, unspecified: Secondary | ICD-10-CM

## 2023-01-07 ENCOUNTER — Ambulatory Visit
Admission: RE | Admit: 2023-01-07 | Discharge: 2023-01-07 | Disposition: A | Discharge status: Home / Self Care | Payer: 59 | Source: Ambulatory Visit | Attending: Neurology | Admitting: Neurology

## 2023-01-07 ENCOUNTER — Ambulatory Visit: Payer: 59 | Admitting: Neurology

## 2023-01-07 ENCOUNTER — Encounter: Payer: Self-pay | Admitting: Neurology

## 2023-01-07 VITALS — BP 93/58 | HR 59 | Ht 68.0 in | Wt 170.0 lb

## 2023-01-07 DIAGNOSIS — M533 Sacrococcygeal disorders, not elsewhere classified: Secondary | ICD-10-CM

## 2023-01-07 DIAGNOSIS — G40409 Other generalized epilepsy and epileptic syndromes, not intractable, without status epilepticus: Secondary | ICD-10-CM

## 2023-01-07 DIAGNOSIS — W19XXXA Unspecified fall, initial encounter: Secondary | ICD-10-CM

## 2023-01-07 MED ORDER — DIVALPROEX SODIUM 500 MG PO DR TAB
500.0000 mg | DELAYED_RELEASE_TABLET | Freq: Three times a day (TID) | ORAL | 0 refills | Status: DC
Start: 2023-01-07 — End: 2023-03-20

## 2023-01-07 MED ORDER — DIVALPROEX SODIUM 500 MG PO DR TAB
500.0000 mg | DELAYED_RELEASE_TABLET | Freq: Three times a day (TID) | ORAL | 3 refills | Status: DC
Start: 2023-01-07 — End: 2023-09-07
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
## 2023-01-07 NOTE — Patient Instructions (Signed)
Refilled for the extra and refilled for the whole year Return in one year or sooner if needed  Epilepsy Epilepsy is when a person keeps having seizures over time. A seizure is a burst of abnormal activity in the brain. This condition can cause problems such as: A change in how you think or behave. Trouble staying awake or knowing what's happening. Falls, accidents, and injury. Depression. You may feel sad or hopeless. Poor memory. In rare cases, this condition can be life-threatening. But most people with epilepsy lead normal lives. What are the causes? Many times, the cause of epilepsy is not known. In some people, it may be caused by: A head injury or an injury that happens at birth. A high fever during childhood. A stroke. Bleeding into or around the brain. Some medicines and drugs. Having too little oxygen for a long time. Abnormal brain development. Conditions such as: Brain infection. Brain tumor. Conditions that are passed down from parent to child. What are the signs or symptoms? Symptoms of a seizure vary from person to person. They may include: Symptoms during a seizure Having convulsions. This means shaking with fast, jerky movements of muscles. Stiffness of the body. Breathing problems. Being confused. Staring or being hard to wake up (being unresponsive). Head nodding, eye blinking, eye twitching, or fast eye movements. Drooling, grunting, or making clicking sounds with your mouth. Losing control of when you pee or poop. Symptoms before a seizure Feeling afraid, worried, or nervous. Feeling like you may vomit. Vertigo. This feels like: You are moving when you're not. Things around you are moving when they're not. Dj vu. This is a feeling of having seen or heard something before. Odd tastes or smells. Changes in how you see, such as seeing flashing lights or spots. Symptoms after a seizure Being confused. Being sleepy. Headache. Sore muscles. How is  this diagnosed? Epilepsy may be diagnosed based on: Your symptoms and medical history. A physical exam. A neurological exam. This includes checking your strength, reflexes, coordination, and senses. Tests. These may include: Electroencephalogram, or EEG. This test records your brain waves. MRI. CT scan. A test of your spinal fluid. This is called a lumbar puncture orspinal tap. Blood tests. How is this treated? Treatment can control or prevent seizures. It may include: Taking medicines. Having a device put in the chest. The device is called a vagus nerve stimulator. It sends signals to a nerve and to the brain to prevent seizures. Brain surgery. Having blood tests often. This helps make sure you are getting the right amount of medicine. Eating foods that are low in carbohydrates and high in fat (ketogenic diet). If you are diagnosed with epilepsy, you should start treatment as soon as you can. For some people, epilepsy goes away in time. Follow these instructions at home: Medicines Take your medicines only as told by your health care provider. Avoid anything that may keep your medicine from working, such as alcohol. Activity Get enough rest and sleep. Not getting enough sleep can make seizures more likely to happen. Follow your provider's advice about driving, swimming, and doing other things that would be dangerous if you had a seizure. If you live in the [place] ask your local department of motor vehicles [facility]) about local driving laws for people with epilepsy. Teaching others  Teach friends and family what to do if you have a seizure. Tell them to: Help you get down to the ground safely. Put a pillow or other soft object under your head  and body. Loosen any clothing around your neck. Turn you on your side. This helps keep your airway clear if you vomit. Stay with you until you are better. Know whether or not you need emergency care. Also, tell them what not to do if you have a  seizure. Tell them: They should not hold you down. They should not put anything in your mouth. General instructions Avoid things that have caused you to have seizures. Keep a seizure diary. Write down: What you remember about each seizure. What might have caused the seizure. Keep all follow-up visits. Your provider may need to monitor your progress. Where to find more information Epilepsy Foundation: epilepsy.com International League Against Epilepsy: ilae.org Contact a health care provider if: You have a change in how often or when you have seizures. You keep having seizures with treatment. You get an infection or start to feel sick. You are not able to take your medicine. Get help right away if: You have or someone has seen you have: A seizure that doesn't stop after 5 minutes. More than one seizure in a row without enough time to recover between seizures. A seizure that makes it harder to breathe. A seizure that leaves you unable to speak or use a part of your body. You didn't wake up right away after a seizure. You injure yourself during a seizure. You have confusion or pain right after a seizure. These symptoms may be an emergency. Call 911 right away. Do not wait to see if the symptoms will go away. Do not drive yourself to the hospital. Also, get help right away if: You feel like you may hurt yourself or others. You have thoughts about taking your own life. Take one of these steps: Go to your nearest emergency room. Call 911. Call the National Suicide Prevention Lifeline at 970-693-6210 or 988. Text the Crisis Text Line at 253-769-8536. This information is not intended to replace advice given to you by your health care provider. Make sure you discuss any questions you have with your health care provider. Document Revised: 07/16/2022 Document Reviewed: 07/16/2022 Elsevier Patient Education  2024 [employer].

## 2023-01-21 ENCOUNTER — Encounter: Payer: Self-pay | Admitting: Family Medicine

## 2023-02-11 ENCOUNTER — Ambulatory Visit (INDEPENDENT_AMBULATORY_CARE_PROVIDER_SITE_OTHER): Payer: 59 | Admitting: Family Medicine

## 2023-02-11 ENCOUNTER — Encounter: Payer: Self-pay | Admitting: Family Medicine

## 2023-02-11 VITALS — BP 103/66 | HR 61 | Ht 68.0 in | Wt 164.4 lb

## 2023-02-11 DIAGNOSIS — R634 Abnormal weight loss: Secondary | ICD-10-CM

## 2023-02-11 DIAGNOSIS — K7460 Unspecified cirrhosis of liver: Secondary | ICD-10-CM

## 2023-02-11 DIAGNOSIS — E559 Vitamin D deficiency, unspecified: Secondary | ICD-10-CM

## 2023-02-11 DIAGNOSIS — Z125 Encounter for screening for malignant neoplasm of prostate: Secondary | ICD-10-CM

## 2023-02-11 DIAGNOSIS — E785 Hyperlipidemia, unspecified: Secondary | ICD-10-CM

## 2023-02-11 DIAGNOSIS — J449 Chronic obstructive pulmonary disease, unspecified: Secondary | ICD-10-CM

## 2023-02-11 DIAGNOSIS — R350 Frequency of micturition: Secondary | ICD-10-CM

## 2023-02-11 MED ORDER — TRELEGY ELLIPTA 100-62.5-25 MCG/ACT IN AEPB: 1.0000 | INHALATION_SPRAY | Freq: Every day | RESPIRATORY_TRACT | 2 refills | Status: DC

## 2023-02-11 NOTE — Assessment & Plan Note (Signed)
Given his hx of hepatitis, cirrhosis, smoking, alcohol use, and other risk factors, there is concern for unidentified malignancy for the cause of his early satiety/weight loss.  - f/u cmp, psa,  - ct abd w/ contrast ordered

## 2023-02-11 NOTE — Assessment & Plan Note (Signed)
Increased frequency, decreased void volume, hesitancy, weak stream. Appears more consistent with bph than oab.  Pt was on flomax and after that was on oxybutynin.  Symptoms not improved.  Will get psa today.  Will refer to urology for further management.  Consider starting finasteride.

## 2023-02-11 NOTE — Assessment & Plan Note (Signed)
Course lung sounds w/ mild low pitch wheezing.  Uses symbicort mult times per day.   - d/c symbicort - adding trelegy.   - discuss referral to pulm at next visit.

## 2023-02-11 NOTE — Patient Instructions (Signed)
It was nice to see you today,  We addressed the following topics today: -I have made the following medication changes today   -I have stopped your oxybutynin and your Symbicort inhaler (budesonide)  -I have ordered a Trelegy inhaler to use instead of Symbicort.  Uses once a day.  You can also use your albuterol inhaler as well. - I would like you to follow back up with me in 1 month - I have ordered some lab tests to evaluate for your weight loss and also I will order a CT scan.  Someone will call you to schedule this once it has been ordered  Have a great day,  Frederic Jericho, MD

## 2023-02-11 NOTE — Assessment & Plan Note (Signed)
Has not followed up with GI or hepatologist for several years.  Continue discussion with pt about referral at next visit.

## 2023-02-11 NOTE — Progress Notes (Signed)
Established Patient Office Visit  Subjective   Patient ID: [name], male    DOB: 03-18-1956  Age: 67 y.o. MRN: [id number]  Chief Complaint  Patient presents with   Medical Management of Chronic Issues    HPI  Unintentional weight loss-patient states that over the past few years he has had lost weight unintentionally.  Has noticed that he gets full easily.  No nausea or vomiting.  Has diarrhea but not significantly.  Does not have a gastroenterologist or hepatologist that he has seen in several years.  Patient states when he does eat he will have a few bites before he gets full.  Highest weight was 225, now 164 pounds.  Difficulty urinating-patient states that he has had difficulty with increased urinary frequency, then only having small amounts of urine come out and having difficulty initiating stream and weak stream.  Patient takes prazosin at night for nightmares.  Was taking oxybutynin as well.  Patient has COPD, uses 2 inhalers, Symbicort and albuterol.  Uses it multiple times a day.  Still feels short of breath.  We discussed switching to a different inhaler.  Patient lives alone, has occasional cigar use, no alcohol use, occasional marijuana use, no other recreational drugs  Patient sees a cardiologist, psychiatrist at [facility], and an orthopedist.  Does not currently see a hepatologist or gastroenterologist   The ASCVD Risk score (Arnett DK, et al., 2019) failed to calculate for the following reasons:   The patient has a prior MI or stroke diagnosis  Health Maintenance Due  Topic Date Due   COVID-19 Vaccine (1) Never done   Colonoscopy  Never done   Pneumonia Vaccine 28+ Years old (2 of 2 - PCV) 07/03/2014   Medicare Annual Wellness (AWV)  04/19/2020   INFLUENZA VACCINE  01/15/2023      Objective:     BP 103/66   Pulse 61   Ht 5\' 8"  (1.727 m)   Wt 164 lb 6.4 oz (74.6 kg)   SpO2 97%   BMI 25.00 kg/m    Physical Exam General: Alert, oriented.  Appears  older than stated age CV: Regular rate and rhythm, no murmurs Pulmonary: Mild low pitch expiratory wheezing bilaterally MSK: Ambulates with a cane.  Gait is slow and stiff. Skin: Tattoos covering both arms.   No results found for any visits on 02/11/23.      Assessment & Plan:   Vitamin D deficiency -     VITAMIN D 25 Hydroxy (Vit-D Deficiency, Fractures)  Chronic obstructive pulmonary disease, unspecified COPD type (HCC) Assessment & Plan: Course lung sounds w/ mild low pitch wheezing.  Uses symbicort mult times per day.   - d/c symbicort - adding trelegy.   - discuss referral to pulm at next visit.     Hyperlipidemia, unspecified hyperlipidemia type -     Lipid panel -     Hemoglobin A1c  Hepatic cirrhosis, unspecified hepatic cirrhosis type, unspecified whether ascites present [facility]) Assessment & Plan: Has not followed up with GI or hepatologist for several years.  Continue discussion with pt about referral at next visit.    Orders: -     Comprehensive metabolic panel  Screening for malignant neoplasm of prostate -     PSA  Unintentional weight loss Assessment & Plan: Given his hx of hepatitis, cirrhosis, smoking, alcohol use, and other risk factors, there is concern for unidentified malignancy for the cause of his early satiety/weight loss.  - f/u cmp, psa,  -  ct abd w/ contrast ordered  Orders: -     CBC; Future -     C-reactive protein; Future -     Lactate dehydrogenase; Future -     CT ABDOMEN PELVIS W CONTRAST; Future  Urinary frequency Assessment & Plan: Increased frequency, decreased void volume, hesitancy, weak stream. Appears more consistent with bph than oab.  Pt was on flomax and after that was on oxybutynin.  Symptoms not improved.  Will get psa today.  Will refer to urology for further management.  Consider starting finasteride.    Orders: -     Ambulatory referral to Urology  Other orders -     Trelegy Ellipta; Inhale 1 puff into the lungs  daily.  Dispense: 28 each; Refill: 2     Return in about 4 weeks (around 03/11/2023) for Weight loss.    [name], MD

## 2023-02-12 ENCOUNTER — Other Ambulatory Visit: Payer: Self-pay | Admitting: Family Medicine

## 2023-02-12 LAB — COMPREHENSIVE METABOLIC PANEL
ALT: 16 IU/L (ref 0–44)
AST: 21 IU/L (ref 0–40)
Albumin: 4 g/dL (ref 3.9–4.9)
Alkaline Phosphatase: 73 IU/L (ref 44–121)
BUN/Creatinine Ratio: 38 — ABNORMAL HIGH (ref 10–24)
BUN: 27 mg/dL (ref 8–27)
Bilirubin Total: 0.3 mg/dL (ref 0.0–1.2)
CO2: 21 mmol/L (ref 20–29)
Calcium: 8.9 mg/dL (ref 8.6–10.2)
Chloride: 104 mmol/L (ref 96–106)
Creatinine, Ser: 0.72 mg/dL — ABNORMAL LOW (ref 0.76–1.27)
Globulin, Total: 2.1 g/dL (ref 1.5–4.5)
Glucose: 113 mg/dL — ABNORMAL HIGH (ref 70–99)
Potassium: 4.2 mmol/L (ref 3.5–5.2)
Sodium: 140 mmol/L (ref 134–144)
Total Protein: 6.1 g/dL (ref 6.0–8.5)
eGFR: 100 mL/min/{1.73_m2} (ref >59)

## 2023-02-12 LAB — LIPID PANEL
Chol/HDL Ratio: 5.2 ratio — ABNORMAL HIGH (ref 0.0–5.0)
Cholesterol, Total: 140 mg/dL (ref 100–199)
HDL: 27 mg/dL — ABNORMAL LOW (ref >39)
LDL Chol Calc (NIH): 77 mg/dL (ref 0–99)
Triglycerides: 214 mg/dL — ABNORMAL HIGH (ref 0–149)
VLDL Cholesterol Cal: 36 mg/dL (ref 5–40)

## 2023-02-12 LAB — HEMOGLOBIN A1C
Est. average glucose Bld gHb Est-mCnc: 97 mg/dL
Hgb A1c MFr Bld: 5 % (ref 4.8–5.6)

## 2023-02-12 LAB — PSA: Prostate Specific Ag, Serum: 3.3 ng/mL (ref 0.0–4.0)

## 2023-02-12 LAB — VITAMIN D 25 HYDROXY (VIT D DEFICIENCY, FRACTURES): Vit D, 25-Hydroxy: 26.8 ng/mL — ABNORMAL LOW (ref 30.0–100.0)

## 2023-02-25 ENCOUNTER — Ambulatory Visit (HOSPITAL_COMMUNITY): Payer: 59

## 2023-03-02 ENCOUNTER — Other Ambulatory Visit: Payer: Self-pay | Admitting: Nurse Practitioner

## 2023-03-02 ENCOUNTER — Other Ambulatory Visit: Payer: Self-pay | Admitting: Family Medicine

## 2023-03-02 DIAGNOSIS — I10 Essential (primary) hypertension: Secondary | ICD-10-CM

## 2023-03-02 DIAGNOSIS — E785 Hyperlipidemia, unspecified: Secondary | ICD-10-CM

## 2023-03-03 ENCOUNTER — Ambulatory Visit (HOSPITAL_COMMUNITY)
Admission: RE | Admit: 2023-03-03 | Discharge: 2023-03-03 | Disposition: A | Discharge status: Home / Self Care | Payer: 59 | Source: Ambulatory Visit | Attending: Family Medicine | Admitting: Family Medicine

## 2023-03-03 ENCOUNTER — Other Ambulatory Visit: Payer: Self-pay | Admitting: Nurse Practitioner

## 2023-03-03 DIAGNOSIS — N3281 Overactive bladder: Secondary | ICD-10-CM

## 2023-03-03 DIAGNOSIS — R634 Abnormal weight loss: Secondary | ICD-10-CM | POA: Diagnosis present

## 2023-03-03 MED ORDER — METOPROLOL SUCCINATE ER 25 MG PO TB24
12.5000 mg | ORAL_TABLET | Freq: Every day | ORAL | 3 refills | Status: DC
Start: 2023-03-03 — End: 2023-07-15

## 2023-03-03 MED ORDER — IOHEXOL 350 MG/ML SOLN
75.0000 mL | Freq: Once | INTRAVENOUS | Status: AC | PRN
  Administered 2023-03-03: 75 mL via INTRAVENOUS

## 2023-03-03 NOTE — Telephone Encounter (Signed)
Oxybutynin was stopped at his 8/28 visit.  Pt has referral in place to urology.

## 2023-03-16 ENCOUNTER — Ambulatory Visit: Payer: 59 | Admitting: Urology

## 2023-03-16 ENCOUNTER — Ambulatory Visit: Payer: 59 | Admitting: Family Medicine

## 2023-03-16 NOTE — Progress Notes (Deleted)
   Established Patient Office Visit  Subjective   Patient ID: [name], male    DOB: [birth year]  Age: 67 y.o. MRN: [id number]  No chief complaint on file.   HPI  HLD - taking atorvastatin?   Urinary symptoms - prazosin already? Urology referral?   Copd - trelegy - pulm referrl?    The ASCVD Risk score (Arnett DK, et al., 2019) failed to calculate for the following reasons:   The patient has a prior MI or stroke diagnosis  Health Maintenance Due  Topic Date Due   COVID-19 Vaccine (1) Never done   Colonoscopy  Never done   Pneumonia Vaccine 74+ Years old (2 of 2 - PCV) 07/03/2014   Medicare Annual Wellness (AWV)  04/19/2020   INFLUENZA VACCINE  01/15/2023      Objective:     There were no vitals taken for this visit. {Vitals History (Optional):23777}  Physical Exam   No results found for any visits on 03/16/23.      Assessment & Plan:   There are no diagnoses linked to this encounter.   No follow-ups on file.    [name], MD

## 2023-03-19 ENCOUNTER — Inpatient Hospital Stay (HOSPITAL_COMMUNITY)
Admission: EM | Admit: 2023-03-19 | Discharge: 2023-03-24 | DRG: 603 | Disposition: A | Discharge status: Home / Self Care | Payer: 59 | Attending: Internal Medicine | Admitting: Internal Medicine

## 2023-03-19 ENCOUNTER — Ambulatory Visit
Admission: EM | Admit: 2023-03-19 | Discharge: 2023-03-19 | Disposition: A | Discharge status: Home / Self Care | Payer: 59

## 2023-03-19 ENCOUNTER — Emergency Department (HOSPITAL_COMMUNITY): Payer: 59

## 2023-03-19 ENCOUNTER — Ambulatory Visit: Payer: 59

## 2023-03-19 ENCOUNTER — Encounter (HOSPITAL_COMMUNITY): Payer: Self-pay

## 2023-03-19 DIAGNOSIS — F418 Other specified anxiety disorders: Secondary | ICD-10-CM | POA: Diagnosis present

## 2023-03-19 DIAGNOSIS — E876 Hypokalemia: Secondary | ICD-10-CM | POA: Diagnosis present

## 2023-03-19 DIAGNOSIS — M25521 Pain in right elbow: Secondary | ICD-10-CM | POA: Diagnosis not present

## 2023-03-19 DIAGNOSIS — L03119 Cellulitis of unspecified part of limb: Secondary | ICD-10-CM | POA: Diagnosis not present

## 2023-03-19 DIAGNOSIS — J449 Chronic obstructive pulmonary disease, unspecified: Secondary | ICD-10-CM | POA: Diagnosis present

## 2023-03-19 DIAGNOSIS — M25531 Pain in right wrist: Secondary | ICD-10-CM

## 2023-03-19 DIAGNOSIS — E663 Overweight: Secondary | ICD-10-CM | POA: Diagnosis present

## 2023-03-19 DIAGNOSIS — E785 Hyperlipidemia, unspecified: Secondary | ICD-10-CM | POA: Diagnosis present

## 2023-03-19 DIAGNOSIS — Z85828 Personal history of other malignant neoplasm of skin: Secondary | ICD-10-CM

## 2023-03-19 DIAGNOSIS — E8809 Other disorders of plasma-protein metabolism, not elsewhere classified: Secondary | ICD-10-CM | POA: Diagnosis present

## 2023-03-19 DIAGNOSIS — L03113 Cellulitis of right upper limb: Principal | ICD-10-CM | POA: Diagnosis present

## 2023-03-19 DIAGNOSIS — Z79899 Other long term (current) drug therapy: Secondary | ICD-10-CM

## 2023-03-19 DIAGNOSIS — Z8249 Family history of ischemic heart disease and other diseases of the circulatory system: Secondary | ICD-10-CM

## 2023-03-19 DIAGNOSIS — Z87898 Personal history of other specified conditions: Secondary | ICD-10-CM

## 2023-03-19 DIAGNOSIS — F1021 Alcohol dependence, in remission: Secondary | ICD-10-CM | POA: Diagnosis present

## 2023-03-19 DIAGNOSIS — S4991XA Unspecified injury of right shoulder and upper arm, initial encounter: Secondary | ICD-10-CM

## 2023-03-19 DIAGNOSIS — Z6825 Body mass index (BMI) 25.0-25.9, adult: Secondary | ICD-10-CM

## 2023-03-19 DIAGNOSIS — F17290 Nicotine dependence, other tobacco product, uncomplicated: Secondary | ICD-10-CM | POA: Diagnosis present

## 2023-03-19 DIAGNOSIS — M65841 Other synovitis and tenosynovitis, right hand: Secondary | ICD-10-CM | POA: Diagnosis present

## 2023-03-19 DIAGNOSIS — R161 Splenomegaly, not elsewhere classified: Secondary | ICD-10-CM | POA: Diagnosis present

## 2023-03-19 DIAGNOSIS — Z818 Family history of other mental and behavioral disorders: Secondary | ICD-10-CM

## 2023-03-19 DIAGNOSIS — D696 Thrombocytopenia, unspecified: Secondary | ICD-10-CM | POA: Diagnosis present

## 2023-03-19 DIAGNOSIS — M25431 Effusion, right wrist: Secondary | ICD-10-CM | POA: Diagnosis present

## 2023-03-19 DIAGNOSIS — K7460 Unspecified cirrhosis of liver: Secondary | ICD-10-CM | POA: Diagnosis present

## 2023-03-19 DIAGNOSIS — I110 Hypertensive heart disease with heart failure: Secondary | ICD-10-CM | POA: Diagnosis present

## 2023-03-19 DIAGNOSIS — I252 Old myocardial infarction: Secondary | ICD-10-CM

## 2023-03-19 DIAGNOSIS — F419 Anxiety disorder, unspecified: Secondary | ICD-10-CM | POA: Diagnosis present

## 2023-03-19 DIAGNOSIS — W010XXA Fall on same level from slipping, tripping and stumbling without subsequent striking against object, initial encounter: Secondary | ICD-10-CM | POA: Diagnosis present

## 2023-03-19 DIAGNOSIS — I5032 Chronic diastolic (congestive) heart failure: Secondary | ICD-10-CM | POA: Diagnosis present

## 2023-03-19 DIAGNOSIS — D649 Anemia, unspecified: Secondary | ICD-10-CM | POA: Diagnosis not present

## 2023-03-19 DIAGNOSIS — S61011A Laceration without foreign body of right thumb without damage to nail, initial encounter: Secondary | ICD-10-CM | POA: Diagnosis present

## 2023-03-19 DIAGNOSIS — Z96611 Presence of right artificial shoulder joint: Secondary | ICD-10-CM | POA: Diagnosis present

## 2023-03-19 DIAGNOSIS — G40909 Epilepsy, unspecified, not intractable, without status epilepticus: Secondary | ICD-10-CM | POA: Diagnosis present

## 2023-03-19 DIAGNOSIS — Z823 Family history of stroke: Secondary | ICD-10-CM

## 2023-03-19 DIAGNOSIS — J439 Emphysema, unspecified: Secondary | ICD-10-CM | POA: Diagnosis present

## 2023-03-19 DIAGNOSIS — Z8673 Personal history of transient ischemic attack (TIA), and cerebral infarction without residual deficits: Secondary | ICD-10-CM

## 2023-03-19 DIAGNOSIS — F319 Bipolar disorder, unspecified: Secondary | ICD-10-CM | POA: Diagnosis present

## 2023-03-19 LAB — CBC WITH DIFFERENTIAL/PLATELET
Abs Immature Granulocytes: 0.07 10*3/uL (ref 0.00–0.07)
Basophils Absolute: 0 10*3/uL (ref 0.0–0.1)
Basophils Relative: 0 %
Eosinophils Absolute: 0 10*3/uL (ref 0.0–0.5)
Eosinophils Relative: 0 %
HCT: 40.4 % (ref 39.0–52.0)
Hemoglobin: 14 g/dL (ref 13.0–17.0)
Immature Granulocytes: 1 %
Lymphocytes Relative: 6 %
Lymphs Abs: 0.7 10*3/uL (ref 0.7–4.0)
MCH: 33.3 pg (ref 26.0–34.0)
MCHC: 34.7 g/dL (ref 30.0–36.0)
MCV: 96.2 fL (ref 80.0–100.0)
Monocytes Absolute: 0.9 10*3/uL (ref 0.1–1.0)
Monocytes Relative: 8 %
Neutro Abs: 9.8 10*3/uL — ABNORMAL HIGH (ref 1.7–7.7)
Neutrophils Relative %: 85 %
Platelets: 59 10*3/uL — ABNORMAL LOW (ref 150–400)
RBC: 4.2 MIL/uL — ABNORMAL LOW (ref 4.22–5.81)
RDW: 14.4 % (ref 11.5–15.5)
WBC: 11.4 10*3/uL — ABNORMAL HIGH (ref 4.0–10.5)
nRBC: 0 % (ref 0.0–0.2)

## 2023-03-19 LAB — COMPREHENSIVE METABOLIC PANEL
ALT: 24 U/L (ref 0–44)
AST: 30 U/L (ref 15–41)
Albumin: 4.1 g/dL (ref 3.5–5.0)
Alkaline Phosphatase: 48 U/L (ref 38–126)
Anion gap: 11 (ref 5–15)
BUN: 18 mg/dL (ref 8–23)
CO2: 24 mmol/L (ref 22–32)
Calcium: 9.1 mg/dL (ref 8.9–10.3)
Chloride: 103 mmol/L (ref 98–111)
Creatinine, Ser: 0.8 mg/dL (ref 0.61–1.24)
GFR, Estimated: >60 mL/min (ref >60)
Glucose, Bld: 174 mg/dL — ABNORMAL HIGH (ref 70–99)
Potassium: 3.4 mmol/L — ABNORMAL LOW (ref 3.5–5.1)
Sodium: 138 mmol/L (ref 135–145)
Total Bilirubin: 0.8 mg/dL (ref 0.3–1.2)
Total Protein: 7.2 g/dL (ref 6.5–8.1)

## 2023-03-19 LAB — I-STAT CG4 LACTIC ACID, ED: Lactic Acid, Venous: 1.6 mmol/L (ref 0.5–1.9)

## 2023-03-19 MED ORDER — MORPHINE SULFATE (PF) 4 MG/ML IV SOLN
4.0000 mg | Freq: Once | INTRAVENOUS | Status: AC
  Administered 2023-03-19: 4 mg via INTRAVENOUS
  Filled 2023-03-19: qty 1

## 2023-03-19 MED ORDER — VANCOMYCIN HCL 1500 MG/300ML IV SOLN
1500.0000 mg | Freq: Once | INTRAVENOUS | Status: AC
  Administered 2023-03-20: 1500 mg via INTRAVENOUS
  Filled 2023-03-19: qty 300

## 2023-03-19 MED ORDER — SODIUM CHLORIDE 0.9 % IV SOLN
2.0000 g | Freq: Three times a day (TID) | INTRAVENOUS | Status: DC
  Administered 2023-03-20 – 2023-03-23 (×11): 2 g via INTRAVENOUS
  Filled 2023-03-19 (×11): qty 12.5

## 2023-03-19 MED ORDER — ONDANSETRON HCL 4 MG/2ML IJ SOLN
4.0000 mg | Freq: Once | INTRAMUSCULAR | Status: AC
  Administered 2023-03-19: 4 mg via INTRAVENOUS
  Filled 2023-03-19: qty 2

## 2023-03-19 MED ORDER — PIPERACILLIN-TAZOBACTAM 3.375 G IVPB 30 MIN: 3.3750 g | Freq: Three times a day (TID) | INTRAVENOUS | Status: DC

## 2023-03-19 NOTE — ED Triage Notes (Signed)
"  I fell x2 nights ago, [place], slipped on a big rock, fell in the creek landing on the right elbow and heard something crack in my right forearm, can't move it and a lot of swelling". No head injury. No laceration.

## 2023-03-19 NOTE — ED Provider Notes (Signed)
[place] EMERGENCY DEPARTMENT AT [facility] Provider Note   CSN: [id number] Arrival date & time: 03/19/23  2016     History  Chief Complaint  Patient presents with   Hand Pain    [name] is a 67 y.o. male with a history of seizure, liver cirrhosis, thrombocytopenia, and heart failure who presents the ED today for right hand injury.  Reports he was [place] 2 [place] when he fell.  He sustained a laceration to the palmar aspect of his right thumb and right volar wrist.  Patient reports that since then he has developed pain and swelling in his right hand, wrist, and up to his elbow.  He reports that he has not been able to move his right arm much second to the pain.  Denies any other injuries during the fall.  No loss of consciousness, vision changes, or slurred speech.  No other complaints or concerns at this time.    Home Medications Prior to Admission medications   Medication Sig Start Date End Date Taking? Authorizing Provider  albuterol (PROVENTIL) (2.5 MG/3ML) 0.083% nebulizer solution Take 3 mLs (2.5 mg total) by nebulization every 6 (six) hours as needed for wheezing or shortness of breath. 07/18/20  Yes Abonza, Kandis Cocking, PA-C  albuterol (VENTOLIN HFA) 108 (90 Base) MCG/ACT inhaler Inhale 2 puffs into the lungs every 6 (six) hours as needed for wheezing or shortness of breath. 09/08/22  Yes Boscia, Heather E, NP  atorvastatin (LIPITOR) 20 MG tablet TAKE 1 TABLET BY MOUTH AT BEDTIME. 03/02/23  Yes [name], MD  busPIRone (BUSPAR) 15 MG tablet Take 15 mg by mouth 3 (three) times daily.   Yes [provider]  divalproex (DEPAKOTE) 500 MG DR tablet Take 1 tablet (500 mg total) by mouth 3 (three) times daily. 01/07/23  Yes Anson Fret, MD  Fluticasone-Umeclidin-Vilant (TRELEGY ELLIPTA) 100-62.5-25 MCG/ACT AEPB Inhale 1 puff into the lungs daily. 02/11/23  Yes [name], MD  lisinopril (ZESTRIL) 5 MG tablet Take 1 tablet (5  mg total) by mouth daily. 09/08/22  Yes Boscia, Kathlynn Grate, NP  metoprolol succinate (TOPROL-XL) 25 MG 24 hr tablet Take 0.5 tablets (12.5 mg total) by mouth daily. 03/03/23  Yes [name], MD  nitroGLYCERIN (NITROSTAT) 0.4 MG SL tablet Place 1 tablet (0.4 mg total) under the tongue every 5 (five) minutes as needed for chest pain. 09/08/22  Yes Boscia, Heather E, NP  ondansetron (ZOFRAN) 4 MG tablet Take 1 tablet (4 mg total) by mouth every 8 (eight) hours as needed for vomiting or nausea. 11/07/22  Yes Yolonda Kida, MD  oxcarbazepine (TRILEPTAL) 600 MG tablet Take 600 mg by mouth every evening.   Yes [provider]  prazosin (MINIPRESS) 1 MG capsule Take 1 mg by mouth every morning.   Yes [provider]  QUEtiapine (SEROQUEL XR) 200 MG 24 hr tablet Take 200 mg by mouth at bedtime. Pt states he takes this medication BID 07/27/18  Yes [provider]  senna-docusate (SENOKOT-S) 8.6-50 MG tablet Take 1 tablet by mouth at bedtime as needed for moderate constipation. 01/01/22  Yes Amin, Ankit C, MD  sertraline (ZOLOFT) 100 MG tablet Take 200 mg by mouth in the morning.   Yes [provider]  Suvorexant (BELSOMRA) 15 MG TABS Take 1 tablet by mouth at bedtime as needed. Patient taking differently: Take 1 tablet by mouth in the morning, at noon, and at bedtime.  12/19/21  Yes Carlean Jews, NP  traZODone (DESYREL) 100 MG tablet Take 200 mg by mouth at bedtime as needed for sleep. 12/04/21  Yes [provider]  divalproex (DEPAKOTE) 500 MG DR tablet Take 1 tablet (500 mg total) by mouth 3 (three) times daily. Patient not taking: Reported on 03/19/2023 01/07/23   Anson Fret, MD  Fluticasone-Umeclidin-Vilant (TRELEGY ELLIPTA) 100-62.5-25 MCG/ACT AEPB Inhale 1 puff into the lungs daily. Patient not taking: Reported on 03/19/2023    [provider]  lactulose (CHRONULAC) 10 GM/15ML solution Take 30 mLs (20 g total) by mouth 2 (two) times daily.  Hold after 2 loose BM every day Patient not taking: Reported on 03/19/2023 12/27/21   Rolly Salter, MD  methocarbamol (ROBAXIN-750) 750 MG tablet Take 1 tablet (750 mg total) by mouth every 8 (eight) hours as needed for muscle spasms. 11/07/22   Yolonda Kida, MD  oxyCODONE (ROXICODONE) 5 MG immediate release tablet Take 1 tablet (5 mg total) by mouth every 6 (six) hours as needed for severe pain or moderate pain. Patient not taking: Reported on 03/19/2023 11/07/22 11/07/23  Yolonda Kida, MD  predniSONE (DELTASONE) 5 MG tablet Take 5 mg by mouth. Patient not taking: Reported on 03/19/2023 01/27/23   [provider]  predniSONE (STERAPRED UNI-PAK 21 TAB) 5 MG (21) TBPK tablet Take 5 mg by mouth as directed. Patient not taking: Reported on 03/19/2023 01/27/23   [provider]      Allergies    Patient has no known allergies.    Review of Systems   Review of Systems  Skin:  Positive for wound.  All other systems reviewed and are negative.   Physical Exam Updated Vital Signs BP 124/75   Pulse 76   Temp 98.2 F (36.8 C) (Oral)   Resp 13   Ht 5\' 8"  (1.727 m)   Wt 74.6 kg   SpO2 95%   BMI 25.01 kg/m  Physical Exam Vitals and nursing note reviewed.  Constitutional:      General: He is not in acute distress.    Appearance: Normal appearance.  HENT:     Head: Normocephalic and atraumatic.     Mouth/Throat:     Mouth: Mucous membranes are moist.  Eyes:     Conjunctiva/sclera: Conjunctivae normal.     Pupils: Pupils are equal, round, and reactive to light.  Cardiovascular:     Rate and Rhythm: Normal rate and regular rhythm.     Pulses: Normal pulses.  Pulmonary:     Effort: Pulmonary effort is normal.     Breath sounds: Normal breath sounds.  Abdominal:     Palpations: Abdomen is soft.     Tenderness: There is no abdominal tenderness.  Musculoskeletal:        General: Tenderness present. No swelling.     Comments: Swelling, tenderness, erythema  to the right hand and wrist.  Small laceration to aspect thenar aspect of right hand as well as small volar wrist laceration.  Palpable radial pulse.  Capillary refills within 3 seconds.  Range of motion is limited secondary to pain.  Skin:    General: Skin is warm and dry.     Findings: No rash.  Neurological:     General: No focal deficit present.     Mental Status: He is alert.     Sensory: No sensory deficit.     Motor: No weakness.  Psychiatric:        Mood and  Affect: Mood normal.        Behavior: Behavior normal.        ED Results / Procedures / Treatments   Labs (all labs ordered are listed, but only abnormal results are displayed) Labs Reviewed  COMPREHENSIVE METABOLIC PANEL - Abnormal; Notable for the following components:      Result Value   Potassium 3.4 (*)    Glucose, Bld 174 (*)    All other components within normal limits  CBC WITH DIFFERENTIAL/PLATELET - Abnormal; Notable for the following components:   WBC 11.4 (*)    RBC 4.20 (*)    Platelets 59 (*)    Neutro Abs 9.8 (*)    All other components within normal limits  CULTURE, BLOOD (ROUTINE X 2)  CULTURE, BLOOD (ROUTINE X 2)  I-STAT CG4 LACTIC ACID, ED  I-STAT CG4 LACTIC ACID, ED    EKG None  Radiology DG Hand Complete Right  Result Date: 03/19/2023 CLINICAL DATA:  Patient fell on concrete 2 days ago and cut hand. Laceration to palmar surface. Entire and is hot and swollen. Swelling extending up to arm to elbow. Unable to turn arm AP due to pain and swelling. EXAM: RIGHT HAND - COMPLETE 3+ VIEW; RIGHT FOREARM - 2 VIEW COMPARISON:  Wrist radiographs 03/19/2023 at 6:22 p.m. FINDINGS: Redemonstrated subcutaneous gas about the radial aspect of the proximal hand. Tiny associated hyperdensity in this area could represent a foreign body. Diffuse soft tissue swelling about the hand and wrist. No acute fracture. IMPRESSION: 1. Redemonstrated subcutaneous gas about the radial aspect of the proximal hand. Tiny  associated hyperdensity in this area could represent a foreign body. Correlate with site of laceration. 2. Diffuse soft tissue swelling about the hand and wrist. 3. No acute fracture. Electronically Signed   By: Minerva Fester M.D.   On: 03/19/2023 22:34   DG Forearm Right  Result Date: 03/19/2023 CLINICAL DATA:  Patient fell on concrete 2 days ago and cut hand. Laceration to palmar surface. Entire and is hot and swollen. Swelling extending up to arm to elbow. Unable to turn arm AP due to pain and swelling. EXAM: RIGHT HAND - COMPLETE 3+ VIEW; RIGHT FOREARM - 2 VIEW COMPARISON:  Wrist radiographs 03/19/2023 at 6:22 p.m. FINDINGS: Redemonstrated subcutaneous gas about the radial aspect of the proximal hand. Tiny associated hyperdensity in this area could represent a foreign body. Diffuse soft tissue swelling about the hand and wrist. No acute fracture. IMPRESSION: 1. Redemonstrated subcutaneous gas about the radial aspect of the proximal hand. Tiny associated hyperdensity in this area could represent a foreign body. Correlate with site of laceration. 2. Diffuse soft tissue swelling about the hand and wrist. 3. No acute fracture. Electronically Signed   By: Minerva Fester M.D.   On: 03/19/2023 22:34   DG Wrist 2 Views Right  Result Date: 03/19/2023 CLINICAL DATA:  Fall, pain, swelling EXAM: RIGHT WRIST - 2 VIEW COMPARISON:  None Available. FINDINGS: No acute bony abnormality. Specifically, no fracture, subluxation, or dislocation. There appears to be a few locules of gas within the soft tissues overlying the snuffbox. Recommend correlation for laceration in this area. IMPRESSION: No visible acute bony abnormality. Small apparent air locules in the soft tissues overlying the snuffbox. This may be related to laceration. Electronically Signed   By: Charlett Nose M.D.   On: 03/19/2023 19:58   DG Elbow 2 Views Right  Result Date: 03/19/2023 CLINICAL DATA:  Fall EXAM: RIGHT ELBOW - 2 VIEW COMPARISON:  None  Available. FINDINGS: There is no evidence of fracture, dislocation, or joint effusion. There is no evidence of arthropathy or other focal bone abnormality. Soft tissues are unremarkable. IMPRESSION: Negative. Electronically Signed   By: Charlett Nose M.D.   On: 03/19/2023 19:57    Procedures Procedures: not indicated.   Medications Ordered in ED Medications  ceFEPIme (MAXIPIME) 2 g in sodium chloride 0.9 % 100 mL IVPB (has no administration in time range)  vancomycin (VANCOREADY) IVPB 1500 mg/300 mL (has no administration in time range)  piperacillin-tazobactam (ZOSYN) IVPB 3.375 g (has no administration in time range)  fentaNYL (SUBLIMAZE) injection 50 mcg (has no administration in time range)  morphine (PF) 4 MG/ML injection 4 mg (4 mg Intravenous Given 03/19/23 2341)  ondansetron (ZOFRAN) injection 4 mg (4 mg Intravenous Given 03/19/23 2304)    ED Course/ Medical Decision Making/ A&P                                 Medical Decision Making Amount and/or Complexity of Data Reviewed Labs: ordered. Radiology: ordered.  Risk Prescription drug management.   This patient presents to the ED for concern of right hand pain, this involves an extensive number of treatment options, and is a complaint that carries with it a high risk of complications and morbidity.   Differential diagnosis includes: X-ray, dislocation, contusion, muscle strain, sepsis, cellulitis, MRSA, etc.   Comorbidities  See HPI above   Additional History  Additional history obtained from previous urgent care note.   Lab Tests  I ordered and personally interpreted labs.  The pertinent results include:   Initial lactic acid of 1.6 Elevated white count at 11.4 otherwise CBC is within normal limits. CMP is within normal limits Blood cultures pending.   Imaging Studies  I ordered imaging studies including right hand and forearm x-rays  I independently visualized and interpreted imaging which showed:  1.  Redemonstrated subcutaneous gas about the radial aspect of the proximal hand. Tiny associated hyperdensity in this area could represent a foreign body. Correlate with site of laceration. 2. Diffuse soft tissue swelling about the hand and wrist.   3. No acute fracture. I agree with the radiologist interpretation   Consultations  I requested consultation with hospitalist,  and discussed lab and imaging findings as well as pertinent plan - they recommend: Patient for further workup and evaluation.   Problem List / ED Course / Critical Interventions / Medication Management  Right hand pain I ordered medications including: Morphine for pain Reevaluation of the patient after these medicines showed that the patient stayed the same I then ordered fentanyl. I called and spoke with the ED pharmacist, they recommended antibiotic therapy with Cefepime, Zosyn, and Vanc.  Started in the ED tonight. I have reviewed the patients home medicines and have made adjustments as needed   Social Determinants of Health  Physical activity   Test / Admission - Considered  Discussed findings with patient and wife at bedside.  They are agreeable with admission.       Final Clinical Impression(s) / ED Diagnoses Final diagnoses:  Cellulitis of hand    Rx / DC Orders ED Discharge Orders     None         Maxwell Marion, PA-C 03/20/23 0013    Durwin Glaze, MD 03/20/23 [phone]

## 2023-03-19 NOTE — ED Notes (Signed)
Patient is being discharged from the Urgent Care and sent to the Emergency Department via Private Vehicle (with sister) . Per R. Reggie Pile, patient is in need of higher level of care due to Higher acuity of care needed. Injury.. Patient is aware and verbalizes understanding of plan of care.  Vitals:   03/19/23 1801  BP: 125/74  Pulse: 82  Resp: 20  Temp: 98.4 F (36.9 C)  SpO2: 96%

## 2023-03-19 NOTE — ED Triage Notes (Addendum)
Pt arrived POV after leaving UC for arm pain, had radiology studies there, no fx noted, soft tissues swelling to right hand. Pt right hand is swollen and red. [place], got a cut to his right hand d/t fall, and now right hand looks infected. Afebrile at current. A&O x4, NAD noted.
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
## 2023-03-19 NOTE — ED Provider Notes (Incomplete)
[place] EMERGENCY DEPARTMENT AT [facility] Provider Note   CSN: [id number] Arrival date & time: 03/19/23  2016     History {Add pertinent medical, surgical, social history, OB history to HPI:1} Chief Complaint  Patient presents with  . Hand Pain    [name] is a 67 y.o. male with a history of seizure, liver cirrhosis, thrombocytopenia, and heart failure who presents the ED today for right hand injury.  Reports he [place] when he [place].  He sustained a laceration to the palmar aspect of his right thumb and right volar wrist.   Hand Pain       Home Medications Prior to Admission medications   Medication Sig Start Date End Date Taking? Authorizing Provider  albuterol (PROVENTIL) (2.5 MG/3ML) 0.083% nebulizer solution Take 3 mLs (2.5 mg total) by nebulization every 6 (six) hours as needed for wheezing or shortness of breath. 07/18/20  Yes Abonza, Kandis Cocking, PA-C  albuterol (VENTOLIN HFA) 108 (90 Base) MCG/ACT inhaler Inhale 2 puffs into the lungs every 6 (six) hours as needed for wheezing or shortness of breath. 09/08/22  Yes Boscia, Heather E, NP  atorvastatin (LIPITOR) 20 MG tablet TAKE 1 TABLET BY MOUTH AT BEDTIME. 03/02/23  Yes [name], MD  busPIRone (BUSPAR) 15 MG tablet Take 15 mg by mouth 3 (three) times daily.   Yes [provider]  divalproex (DEPAKOTE) 500 MG DR tablet Take 1 tablet (500 mg total) by mouth 3 (three) times daily. 01/07/23  Yes Anson Fret, MD  Fluticasone-Umeclidin-Vilant (TRELEGY ELLIPTA) 100-62.5-25 MCG/ACT AEPB Inhale 1 puff into the lungs daily. 02/11/23  Yes [name], MD  lisinopril (ZESTRIL) 5 MG tablet Take 1 tablet (5 mg total) by mouth daily. 09/08/22  Yes Boscia, Kathlynn Grate, NP  metoprolol succinate (TOPROL-XL) 25 MG 24 hr tablet Take 0.5 tablets (12.5 mg total) by mouth daily. 03/03/23  Yes [name], MD  nitroGLYCERIN (NITROSTAT) 0.4 MG SL tablet Place 1 tablet (0.4  mg total) under the tongue every 5 (five) minutes as needed for chest pain. 09/08/22  Yes Boscia, Heather E, NP  ondansetron (ZOFRAN) 4 MG tablet Take 1 tablet (4 mg total) by mouth every 8 (eight) hours as needed for vomiting or nausea. 11/07/22  Yes Yolonda Kida, MD  oxcarbazepine (TRILEPTAL) 600 MG tablet Take 600 mg by mouth every evening.   Yes [provider]  prazosin (MINIPRESS) 1 MG capsule Take 1 mg by mouth every morning.   Yes [provider]  QUEtiapine (SEROQUEL XR) 200 MG 24 hr tablet Take 200 mg by mouth at bedtime. Pt states he takes this medication BID 07/27/18  Yes [provider]  senna-docusate (SENOKOT-S) 8.6-50 MG tablet Take 1 tablet by mouth at bedtime as needed for moderate constipation. 01/01/22  Yes Amin, Ankit C, MD  sertraline (ZOLOFT) 100 MG tablet Take 200 mg by mouth in the morning.   Yes [provider]  Suvorexant (BELSOMRA) 15 MG TABS Take 1 tablet by mouth at bedtime as needed. Patient taking differently: Take 1 tablet by mouth in the morning, at noon, and at bedtime. 12/19/21  Yes Carlean Jews, NP  traZODone (DESYREL) 100 MG tablet Take 200 mg by mouth at bedtime as needed for sleep. 12/04/21  Yes [provider]  divalproex (DEPAKOTE) 500 MG DR tablet Take 1 tablet (500 mg total) by mouth 3 (three) times daily. Patient not taking:  Reported on 03/19/2023 01/07/23   Anson Fret, MD  Fluticasone-Umeclidin-Vilant (TRELEGY ELLIPTA) 100-62.5-25 MCG/ACT AEPB Inhale 1 puff into the lungs daily. Patient not taking: Reported on 03/19/2023    [provider]  lactulose (CHRONULAC) 10 GM/15ML solution Take 30 mLs (20 g total) by mouth 2 (two) times daily. Hold after 2 loose BM every day Patient not taking: Reported on 03/19/2023 12/27/21   Rolly Salter, MD  methocarbamol (ROBAXIN-750) 750 MG tablet Take 1 tablet (750 mg total) by mouth every 8 (eight) hours as needed for muscle spasms. 11/07/22   Yolonda Kida, MD  oxyCODONE (ROXICODONE) 5 MG immediate release tablet Take 1 tablet (5 mg total) by mouth every 6 (six) hours as needed for severe pain or moderate pain. Patient not taking: Reported on 03/19/2023 11/07/22 11/07/23  Yolonda Kida, MD  predniSONE (DELTASONE) 5 MG tablet Take 5 mg by mouth. Patient not taking: Reported on 03/19/2023 01/27/23   [provider]  predniSONE (STERAPRED UNI-PAK 21 TAB) 5 MG (21) TBPK tablet Take 5 mg by mouth as directed. Patient not taking: Reported on 03/19/2023 01/27/23   [provider]      Allergies    Patient has no known allergies.    Review of Systems   Review of Systems  Skin:  Positive for wound.  All other systems reviewed and are negative.   Physical Exam Updated Vital Signs BP 130/68 (BP Location: Left Arm)   Pulse 82   Temp 98.4 F (36.9 C) (Oral)   Resp 18   Ht 5\' 8"  (1.727 m)   Wt 74.6 kg   SpO2 99%   BMI 25.01 kg/m  Physical Exam Vitals and nursing note reviewed.  Constitutional:      General: He is not in acute distress.    Appearance: Normal appearance.  HENT:     Head: Normocephalic and atraumatic.     Mouth/Throat:     Mouth: Mucous membranes are moist.  Eyes:     Conjunctiva/sclera: Conjunctivae normal.     Pupils: Pupils are equal, round, and reactive to light.  Cardiovascular:     Rate and Rhythm: Normal rate and regular rhythm.     Pulses: Normal pulses.  Pulmonary:     Effort: Pulmonary effort is normal.     Breath sounds: Normal breath sounds.  Abdominal:     Palpations: Abdomen is soft.     Tenderness: There is no abdominal tenderness.  Musculoskeletal:        General: Tenderness present. No swelling.     Comments: Swelling, tenderness, erythema to the right hand and wrist.  Small laceration to aspect thenar aspect of right hand as well as small volar wrist laceration.  Palpable radial pulse.  Capillary refills within 3 seconds.  Range of motion is limited secondary to pain.   Skin:    General: Skin is warm and dry.     Findings: No rash.  Neurological:     General: No focal deficit present.     Mental Status: He is alert.     Sensory: No sensory deficit.     Motor: No weakness.  Psychiatric:        Mood and Affect: Mood normal.        Behavior: Behavior normal.        ED Results / Procedures / Treatments   Labs (all labs ordered are listed, but only abnormal results are displayed) Labs Reviewed  COMPREHENSIVE METABOLIC PANEL - Abnormal;  Notable for the following components:      Result Value   Potassium 3.4 (*)    Glucose, Bld 174 (*)    All other components within normal limits  CBC WITH DIFFERENTIAL/PLATELET - Abnormal; Notable for the following components:   WBC 11.4 (*)    RBC 4.20 (*)    Platelets 59 (*)    Neutro Abs 9.8 (*)    All other components within normal limits  CULTURE, BLOOD (ROUTINE X 2)  CULTURE, BLOOD (ROUTINE X 2)  I-STAT CG4 LACTIC ACID, ED  I-STAT CG4 LACTIC ACID, ED    EKG None  Radiology DG Wrist 2 Views Right  Result Date: 03/19/2023 CLINICAL DATA:  Fall, pain, swelling EXAM: RIGHT WRIST - 2 VIEW COMPARISON:  None Available. FINDINGS: No acute bony abnormality. Specifically, no fracture, subluxation, or dislocation. There appears to be a few locules of gas within the soft tissues overlying the snuffbox. Recommend correlation for laceration in this area. IMPRESSION: No visible acute bony abnormality. Small apparent air locules in the soft tissues overlying the snuffbox. This may be related to laceration. Electronically Signed   By: Charlett Nose M.D.   On: 03/19/2023 19:58   DG Elbow 2 Views Right  Result Date: 03/19/2023 CLINICAL DATA:  Fall EXAM: RIGHT ELBOW - 2 VIEW COMPARISON:  None Available. FINDINGS: There is no evidence of fracture, dislocation, or joint effusion. There is no evidence of arthropathy or other focal bone abnormality. Soft tissues are unremarkable. IMPRESSION: Negative. Electronically Signed    By: Charlett Nose M.D.   On: 03/19/2023 19:57    Procedures Procedures: not indicated. {Document cardiac monitor, telemetry assessment procedure when appropriate:1}  Medications Ordered in ED Medications  morphine (PF) 4 MG/ML injection 4 mg (has no administration in time range)  ondansetron (ZOFRAN) injection 4 mg (has no administration in time range)    ED Course/ Medical Decision Making/ A&P   {   Click here for ABCD2, HEART and other calculatorsREFRESH Note before signing :1}                              Medical Decision Making Amount and/or Complexity of Data Reviewed Labs: ordered. Radiology: ordered.  Risk Prescription drug management.   ***  {Document critical care time when appropriate:1} {Document review of labs and clinical decision tools ie heart score, Chads2Vasc2 etc:1}  {Document your independent review of radiology images, and any outside records:1} {Document your discussion with family members, caretakers, and with consultants:1} {Document social determinants of health affecting pt's care:1} {Document your decision making why or why not admission, treatments were needed:1} Final Clinical Impression(s) / ED Diagnoses Final diagnoses:  None    Rx / DC Orders ED Discharge Orders     None

## 2023-03-20 ENCOUNTER — Inpatient Hospital Stay (HOSPITAL_COMMUNITY): Payer: 59

## 2023-03-20 ENCOUNTER — Encounter (HOSPITAL_COMMUNITY): Payer: Self-pay | Admitting: Family Medicine

## 2023-03-20 DIAGNOSIS — K7460 Unspecified cirrhosis of liver: Secondary | ICD-10-CM | POA: Diagnosis present

## 2023-03-20 DIAGNOSIS — E663 Overweight: Secondary | ICD-10-CM | POA: Diagnosis present

## 2023-03-20 DIAGNOSIS — Z8249 Family history of ischemic heart disease and other diseases of the circulatory system: Secondary | ICD-10-CM | POA: Diagnosis not present

## 2023-03-20 DIAGNOSIS — F319 Bipolar disorder, unspecified: Secondary | ICD-10-CM | POA: Diagnosis present

## 2023-03-20 DIAGNOSIS — F418 Other specified anxiety disorders: Secondary | ICD-10-CM | POA: Diagnosis not present

## 2023-03-20 DIAGNOSIS — Z96611 Presence of right artificial shoulder joint: Secondary | ICD-10-CM | POA: Insufficient documentation

## 2023-03-20 DIAGNOSIS — Z79899 Other long term (current) drug therapy: Secondary | ICD-10-CM | POA: Diagnosis not present

## 2023-03-20 DIAGNOSIS — Z87898 Personal history of other specified conditions: Secondary | ICD-10-CM

## 2023-03-20 DIAGNOSIS — I5032 Chronic diastolic (congestive) heart failure: Secondary | ICD-10-CM | POA: Diagnosis present

## 2023-03-20 DIAGNOSIS — D696 Thrombocytopenia, unspecified: Secondary | ICD-10-CM | POA: Diagnosis present

## 2023-03-20 DIAGNOSIS — M65841 Other synovitis and tenosynovitis, right hand: Secondary | ICD-10-CM | POA: Diagnosis present

## 2023-03-20 DIAGNOSIS — F419 Anxiety disorder, unspecified: Secondary | ICD-10-CM | POA: Diagnosis present

## 2023-03-20 DIAGNOSIS — L03119 Cellulitis of unspecified part of limb: Secondary | ICD-10-CM | POA: Diagnosis present

## 2023-03-20 DIAGNOSIS — M25431 Effusion, right wrist: Secondary | ICD-10-CM | POA: Diagnosis present

## 2023-03-20 DIAGNOSIS — E876 Hypokalemia: Secondary | ICD-10-CM | POA: Diagnosis present

## 2023-03-20 DIAGNOSIS — S61011A Laceration without foreign body of right thumb without damage to nail, initial encounter: Secondary | ICD-10-CM | POA: Diagnosis present

## 2023-03-20 DIAGNOSIS — I110 Hypertensive heart disease with heart failure: Secondary | ICD-10-CM | POA: Diagnosis present

## 2023-03-20 DIAGNOSIS — F1021 Alcohol dependence, in remission: Secondary | ICD-10-CM | POA: Diagnosis present

## 2023-03-20 DIAGNOSIS — G40909 Epilepsy, unspecified, not intractable, without status epilepticus: Secondary | ICD-10-CM | POA: Diagnosis present

## 2023-03-20 DIAGNOSIS — J449 Chronic obstructive pulmonary disease, unspecified: Secondary | ICD-10-CM | POA: Diagnosis not present

## 2023-03-20 DIAGNOSIS — I252 Old myocardial infarction: Secondary | ICD-10-CM | POA: Diagnosis not present

## 2023-03-20 DIAGNOSIS — F17290 Nicotine dependence, other tobacco product, uncomplicated: Secondary | ICD-10-CM | POA: Diagnosis present

## 2023-03-20 DIAGNOSIS — R161 Splenomegaly, not elsewhere classified: Secondary | ICD-10-CM | POA: Diagnosis present

## 2023-03-20 DIAGNOSIS — J439 Emphysema, unspecified: Secondary | ICD-10-CM | POA: Diagnosis present

## 2023-03-20 DIAGNOSIS — D649 Anemia, unspecified: Secondary | ICD-10-CM | POA: Diagnosis not present

## 2023-03-20 DIAGNOSIS — E8809 Other disorders of plasma-protein metabolism, not elsewhere classified: Secondary | ICD-10-CM | POA: Diagnosis present

## 2023-03-20 DIAGNOSIS — L03113 Cellulitis of right upper limb: Secondary | ICD-10-CM | POA: Diagnosis present

## 2023-03-20 DIAGNOSIS — W010XXA Fall on same level from slipping, tripping and stumbling without subsequent striking against object, initial encounter: Secondary | ICD-10-CM | POA: Diagnosis present

## 2023-03-20 DIAGNOSIS — E785 Hyperlipidemia, unspecified: Secondary | ICD-10-CM | POA: Diagnosis present

## 2023-03-20 LAB — BASIC METABOLIC PANEL
Anion gap: 8 (ref 5–15)
BUN: 16 mg/dL (ref 8–23)
CO2: 25 mmol/L (ref 22–32)
Calcium: 8.6 mg/dL — ABNORMAL LOW (ref 8.9–10.3)
Chloride: 106 mmol/L (ref 98–111)
Creatinine, Ser: 0.57 mg/dL — ABNORMAL LOW (ref 0.61–1.24)
GFR, Estimated: >60 mL/min (ref >60)
Glucose, Bld: 86 mg/dL (ref 70–99)
Potassium: 3.8 mmol/L (ref 3.5–5.1)
Sodium: 139 mmol/L (ref 135–145)

## 2023-03-20 LAB — CBC
HCT: 40.3 % (ref 39.0–52.0)
Hemoglobin: 13.5 g/dL (ref 13.0–17.0)
MCH: 33 pg (ref 26.0–34.0)
MCHC: 33.5 g/dL (ref 30.0–36.0)
MCV: 98.5 fL (ref 80.0–100.0)
Platelets: 51 10*3/uL — ABNORMAL LOW (ref 150–400)
RBC: 4.09 MIL/uL — ABNORMAL LOW (ref 4.22–5.81)
RDW: 14.3 % (ref 11.5–15.5)
WBC: 6.4 10*3/uL (ref 4.0–10.5)
nRBC: 0 % (ref 0.0–0.2)

## 2023-03-20 LAB — HEPATIC FUNCTION PANEL
ALT: 20 U/L (ref 0–44)
AST: 24 U/L (ref 15–41)
Albumin: 3.5 g/dL (ref 3.5–5.0)
Alkaline Phosphatase: 42 U/L (ref 38–126)
Bilirubin, Direct: 0.4 mg/dL — ABNORMAL HIGH (ref 0.0–0.2)
Indirect Bilirubin: 0.7 mg/dL (ref 0.3–0.9)
Total Bilirubin: 1.1 mg/dL (ref 0.3–1.2)
Total Protein: 5.8 g/dL — ABNORMAL LOW (ref 6.5–8.1)

## 2023-03-20 LAB — DIFFERENTIAL
Abs Immature Granulocytes: 0.03 10*3/uL (ref 0.00–0.07)
Basophils Absolute: 0 10*3/uL (ref 0.0–0.1)
Basophils Relative: 0 %
Eosinophils Absolute: 0 10*3/uL (ref 0.0–0.5)
Eosinophils Relative: 1 %
Immature Granulocytes: 1 %
Lymphocytes Relative: 12 %
Lymphs Abs: 0.8 10*3/uL (ref 0.7–4.0)
Monocytes Absolute: 0.7 10*3/uL (ref 0.1–1.0)
Monocytes Relative: 10 %
Neutro Abs: 5 10*3/uL (ref 1.7–7.7)
Neutrophils Relative %: 76 %

## 2023-03-20 LAB — MAGNESIUM: Magnesium: 2 mg/dL (ref 1.7–2.4)

## 2023-03-20 LAB — HIV ANTIBODY (ROUTINE TESTING W REFLEX): HIV Screen 4th Generation wRfx: NONREACTIVE

## 2023-03-20 LAB — PHOSPHORUS: Phosphorus: 2.9 mg/dL (ref 2.5–4.6)

## 2023-03-20 MED ORDER — GADOPICLENOL 0.5 MMOL/ML IV SOLN
8.0000 mL | Freq: Once | INTRAVENOUS | Status: AC | PRN
  Administered 2023-03-20: 8 mL via INTRAVENOUS

## 2023-03-20 MED ORDER — OXCARBAZEPINE 300 MG PO TABS
600.0000 mg | ORAL_TABLET | Freq: Every evening | ORAL | Status: DC
  Administered 2023-03-20 – 2023-03-23 (×4): 600 mg via ORAL
  Filled 2023-03-20 (×5): qty 2

## 2023-03-20 MED ORDER — ACETAMINOPHEN 325 MG PO TABS: 650.0000 mg | ORAL_TABLET | Freq: Four times a day (QID) | ORAL | Status: DC | PRN

## 2023-03-20 MED ORDER — METHOCARBAMOL 500 MG PO TABS
750.0000 mg | ORAL_TABLET | Freq: Three times a day (TID) | ORAL | Status: DC | PRN
  Administered 2023-03-22: 750 mg via ORAL
  Filled 2023-03-20: qty 2

## 2023-03-20 MED ORDER — ONDANSETRON HCL 4 MG/2ML IJ SOLN: 4.0000 mg | Freq: Four times a day (QID) | INTRAMUSCULAR | Status: DC | PRN

## 2023-03-20 MED ORDER — OXYCODONE HCL 5 MG PO TABS
5.0000 mg | ORAL_TABLET | ORAL | Status: DC | PRN
  Administered 2023-03-20 – 2023-03-24 (×7): 5 mg via ORAL
  Filled 2023-03-20 (×8): qty 1

## 2023-03-20 MED ORDER — POTASSIUM CHLORIDE CRYS ER 20 MEQ PO TBCR
20.0000 meq | EXTENDED_RELEASE_TABLET | Freq: Once | ORAL | Status: AC
  Administered 2023-03-20: 20 meq via ORAL
  Filled 2023-03-20: qty 1

## 2023-03-20 MED ORDER — SERTRALINE HCL 100 MG PO TABS
200.0000 mg | ORAL_TABLET | Freq: Every day | ORAL | Status: DC
  Administered 2023-03-20 – 2023-03-24 (×5): 200 mg via ORAL
  Filled 2023-03-20 (×5): qty 2

## 2023-03-20 MED ORDER — VANCOMYCIN HCL IN DEXTROSE 1-5 GM/200ML-% IV SOLN
1000.0000 mg | Freq: Two times a day (BID) | INTRAVENOUS | Status: DC
  Administered 2023-03-20 – 2023-03-23 (×7): 1000 mg via INTRAVENOUS
  Filled 2023-03-20 (×7): qty 200

## 2023-03-20 MED ORDER — METOPROLOL SUCCINATE ER 25 MG PO TB24
12.5000 mg | ORAL_TABLET | Freq: Every day | ORAL | Status: DC
  Administered 2023-03-21 – 2023-03-24 (×4): 12.5 mg via ORAL
  Filled 2023-03-20 (×5): qty 1

## 2023-03-20 MED ORDER — LACTULOSE 10 GM/15ML PO SOLN
20.0000 g | Freq: Two times a day (BID) | ORAL | Status: DC
  Administered 2023-03-20 – 2023-03-24 (×9): 20 g via ORAL
  Filled 2023-03-20 (×9): qty 30

## 2023-03-20 MED ORDER — FLUTICASONE FUROATE-VILANTEROL 100-25 MCG/ACT IN AEPB
1.0000 | INHALATION_SPRAY | Freq: Every day | RESPIRATORY_TRACT | Status: DC
  Administered 2023-03-20 – 2023-03-24 (×5): 1 via RESPIRATORY_TRACT
  Filled 2023-03-20: qty 28

## 2023-03-20 MED ORDER — SENNOSIDES-DOCUSATE SODIUM 8.6-50 MG PO TABS: 1.0000 | ORAL_TABLET | Freq: Every evening | ORAL | Status: DC | PRN

## 2023-03-20 MED ORDER — TRAZODONE HCL 100 MG PO TABS
200.0000 mg | ORAL_TABLET | Freq: Every evening | ORAL | Status: DC | PRN
  Administered 2023-03-22 – 2023-03-23 (×3): 200 mg via ORAL
  Filled 2023-03-20 (×3): qty 2

## 2023-03-20 MED ORDER — BUSPIRONE HCL 5 MG PO TABS
15.0000 mg | ORAL_TABLET | Freq: Three times a day (TID) | ORAL | Status: DC
  Administered 2023-03-20 – 2023-03-24 (×13): 15 mg via ORAL
  Filled 2023-03-20 (×13): qty 1

## 2023-03-20 MED ORDER — ALBUTEROL SULFATE (2.5 MG/3ML) 0.083% IN NEBU: 2.5000 mg | INHALATION_SOLUTION | Freq: Four times a day (QID) | RESPIRATORY_TRACT | Status: DC | PRN

## 2023-03-20 MED ORDER — LISINOPRIL 5 MG PO TABS
5.0000 mg | ORAL_TABLET | Freq: Every day | ORAL | Status: DC
  Administered 2023-03-20 – 2023-03-24 (×5): 5 mg via ORAL
  Filled 2023-03-20 (×5): qty 1

## 2023-03-20 MED ORDER — ACETAMINOPHEN 650 MG RE SUPP: 650.0000 mg | Freq: Four times a day (QID) | RECTAL | Status: DC | PRN

## 2023-03-20 MED ORDER — QUETIAPINE FUMARATE ER 200 MG PO TB24
200.0000 mg | ORAL_TABLET | Freq: Every day | ORAL | Status: DC
  Administered 2023-03-20 – 2023-03-23 (×5): 200 mg via ORAL
  Filled 2023-03-20 (×5): qty 1

## 2023-03-20 MED ORDER — DIVALPROEX SODIUM 250 MG PO DR TAB
500.0000 mg | DELAYED_RELEASE_TABLET | Freq: Three times a day (TID) | ORAL | Status: DC
  Administered 2023-03-20 – 2023-03-24 (×13): 500 mg via ORAL
  Filled 2023-03-20 (×13): qty 2

## 2023-03-20 MED ORDER — HYDROMORPHONE HCL 1 MG/ML IJ SOLN
0.5000 mg | INTRAMUSCULAR | Status: AC | PRN
  Administered 2023-03-20 (×2): 0.5 mg via INTRAVENOUS
  Filled 2023-03-20 (×2): qty 0.5

## 2023-03-20 MED ORDER — UMECLIDINIUM BROMIDE 62.5 MCG/ACT IN AEPB
1.0000 | INHALATION_SPRAY | Freq: Every day | RESPIRATORY_TRACT | Status: DC
  Administered 2023-03-20 – 2023-03-24 (×5): 1 via RESPIRATORY_TRACT
  Filled 2023-03-20: qty 7

## 2023-03-20 MED ORDER — ATORVASTATIN CALCIUM 10 MG PO TABS
20.0000 mg | ORAL_TABLET | Freq: Every day | ORAL | Status: DC
  Administered 2023-03-20 – 2023-03-23 (×4): 20 mg via ORAL
  Filled 2023-03-20 (×4): qty 2

## 2023-03-20 MED ORDER — FENTANYL CITRATE PF 50 MCG/ML IJ SOSY
50.0000 ug | PREFILLED_SYRINGE | Freq: Once | INTRAMUSCULAR | Status: AC
  Administered 2023-03-20: 50 ug via INTRAVENOUS
  Filled 2023-03-20: qty 1

## 2023-03-20 MED ORDER — ONDANSETRON HCL 4 MG PO TABS: 4.0000 mg | ORAL_TABLET | Freq: Four times a day (QID) | ORAL | Status: DC | PRN

## 2023-03-20 MED ORDER — PRAZOSIN HCL 1 MG PO CAPS
1.0000 mg | ORAL_CAPSULE | Freq: Every morning | ORAL | Status: DC
  Administered 2023-03-20 – 2023-03-24 (×5): 1 mg via ORAL
  Filled 2023-03-20 (×5): qty 1

## 2023-03-20 NOTE — Progress Notes (Signed)
Notified Pharmacy for scheduled DPI's at [phone].
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
## 2023-03-20 NOTE — Progress Notes (Signed)
Pharmacy Antibiotic Note  [name] is a 67 y.o. male admitted on 03/19/2023 with  history of seizure, liver cirrhosis, thrombocytopenia, and heart failure who presents the ED today for right hand injury.  Reports [place] he [place].  He sustained a laceration to the palmar aspect of his right thumb and right volar wrist. .  Pharmacy has been consulted for vancomycin and cefepime dosing.  Plan: Vancomycin 1500mg  IV x 1 then 1gm q12h (AUC 494.1, Scr 0.8) Cefepime 2gm IV q8h Follow renal function,cultures and clinical course  Height: 5\' 8"  (172.7 cm) Weight: 74.6 kg (164 lb 7.4 oz) IBW/kg (Calculated) : 68.4  Temp (24hrs), Avg:98.3 F (36.8 C), Min:98.2 F (36.8 C), Max:98.4 F (36.9 C)  Recent Labs  Lab 03/19/23 2050  WBC 11.4*  CREATININE 0.80  LATICACIDVEN 1.6    Estimated Creatinine Clearance: 85.5 mL/min (by C-G formula based on SCr of 0.8 mg/dL).    No Known Allergies  Antimicrobials this admission: 10/4 cefepime >> 10/4 vanc >>  Dose adjustments this admission:   Microbiology results: 10/3 BCx:   Thank you for allowing pharmacy to be a part of this patient's care.  Arley Phenix RPh 03/20/2023, 12:33 AM

## 2023-03-20 NOTE — Hospital Course (Signed)
Patient is a 67 year old overweight Caucasian male with a past medical history significant for but not limited to alcoholism in remission, hepatitis C status posttreatment with Harvoni, cirrhosis, hypertension, seizure disorder, depression and anxiety as well as COPD who presented with right forearm pain and swelling but mainly it was in his hand.  He reports that he stopped 2 to 3 days ago and suffered some minor lacerations near the wrist and subsequently since then he developed progressive pain and swelling as well as redness to to the hand and wrist.  The pain became so severe that he presented to the ED for further evaluation.  X-Ray of the hand demonstrated tissue swelling of the right hand with subcutaneous gas about the radial aspect of the proximal hand with questionable tiny foreign body.  Blood cultures were collected in ED and he was placed on IV antibiotics of vancomycin and cefepime.  We have ordered an MRI for further evaluation and asked hand surgery consult. Currently being admitted and treated for the following but not limited too:   RUE and Hand Cellulitis  -Continue treatment with vancomycin and cefepime, follow cultures and clinical course,  -Ordering MRI and hand surgery consultation since    COPD  -Not in exacerbation  -Continue ICS-LAMA-LABA and as-needed SABA     Cirrhosis  -Appears compensated   -Continue Lactulose     Hypertension  - Continue lisinopril     Seizures  -Continue Depakote and Trileptal   -Seizure Precautions   Depression, Anxiety  -Continue Buspar, Zoloft, Seroquel, and trazodone    Thrombocytopenia  -Appears chronic and stable  -Likely related to liver cirrhosis, splenomegaly  Recent Labs  Lab 03/19/23 2050 03/20/23 0906  PLT 59* 51*  -Continue to Monitor and Trend and Repeat CBC in the AM

## 2023-03-20 NOTE — Plan of Care (Signed)
  Problem: Education: Goal: Knowledge of the prescribed therapeutic regimen will improve Outcome: Progressing Goal: Understanding of activity limitations/precautions following surgery will improve Outcome: Progressing Goal: Individualized Educational Video(s) Outcome: Progressing   Problem: Activity: Goal: Ability to tolerate increased activity will improve Outcome: Progressing   Problem: Pain Management: Goal: Pain level will decrease with appropriate interventions Outcome: Progressing   Problem: Education: Goal: Knowledge of General Education information will improve Description: Including pain rating scale, medication(s)/side effects and non-pharmacologic comfort measures Outcome: Progressing   Problem: Health Behavior/Discharge Planning: Goal: Ability to manage health-related needs will improve Outcome: Progressing   Problem: Clinical Measurements: Goal: Ability to maintain clinical measurements within normal limits will improve Outcome: Progressing Goal: Will remain free from infection Outcome: Progressing Goal: Diagnostic test results will improve Outcome: Progressing Goal: Respiratory complications will improve Outcome: Progressing Goal: Cardiovascular complication will be avoided Outcome: Progressing   Problem: Activity: Goal: Risk for activity intolerance will decrease Outcome: Progressing   Problem: Nutrition: Goal: Adequate nutrition will be maintained Outcome: Progressing   Problem: Coping: Goal: Level of anxiety will decrease Outcome: Progressing   Problem: Elimination: Goal: Will not experience complications related to bowel motility Outcome: Progressing Goal: Will not experience complications related to urinary retention Outcome: Progressing   Problem: Pain Managment: Goal: General experience of comfort will improve Outcome: Progressing   Problem: Safety: Goal: Ability to remain free from injury will improve Outcome: Progressing   Problem:  Skin Integrity: Goal: Risk for impaired skin integrity will decrease Outcome: Progressing   Problem: Clinical Measurements: Goal: Ability to avoid or minimize complications of infection will improve Outcome: Progressing   Problem: Skin Integrity: Goal: Skin integrity will improve Outcome: Progressing

## 2023-03-20 NOTE — Progress Notes (Signed)
PROGRESS NOTE    [name]  GNF:[id number] DOB: [birth year] DOA: 03/19/2023 PCP: [name], MD   Brief Narrative:  Patient is a 67 year old overweight Caucasian male with a past medical history significant for but not limited to alcoholism in remission, hepatitis C status posttreatment with Harvoni, cirrhosis, hypertension, seizure disorder, depression and anxiety as well as COPD who presented with right forearm pain and swelling but mainly it was in his hand.  He reports that he stopped 2 to 3 days ago and suffered some minor lacerations near the wrist and subsequently since then he developed progressive pain and swelling as well as redness to to the hand and wrist.  The pain became so severe that he presented to the ED for further evaluation.  X-Ray of the hand demonstrated tissue swelling of the right hand with subcutaneous gas about the radial aspect of the proximal hand with questionable tiny foreign body.  Blood cultures were collected in ED and he was placed on IV antibiotics of vancomycin and cefepime.  We have ordered an MRI for further evaluation and asked hand surgery consult. Currently being admitted and treated for the following but not limited too:   RUE and Hand Cellulitis  -Continue treatment with vancomycin and cefepime, follow cultures and clinical course,  -Ordering MRI and hand surgery consultation since    COPD  -Not in exacerbation  -Continue ICS-LAMA-LABA and as-needed SABA     Cirrhosis  -Appears compensated   -Continue Lactulose     Hypertension  - Continue lisinopril     Seizures  -Continue Depakote and Trileptal   -Seizure Precautions   Depression, Anxiety  -Continue Buspar, Zoloft, Seroquel, and trazodone    Thrombocytopenia  -Appears chronic and stable  -Likely related to liver cirrhosis, splenomegaly  Recent Labs  Lab 03/19/23 2050 03/20/23 0906  PLT 59* 51*  -Continue to Monitor and Trend and Repeat CBC in the AM   DVT prophylaxis:  SCDs Start: 03/20/23 0021    Code Status: Full Code Family Communication: No family present at bedside  Disposition Plan:  Level of care: Med-Surg Status is: Inpatient Remains inpatient appropriate because: Needs further clinical improvement and evaluation by Hand Surgery   Consultants:  Hand Surgery Dr. Izora Ribas  Procedures:  As delineated as above  Antimicrobials:  Anti-infectives (From admission, onward)    Start     Dose/Rate Route Frequency Ordered Stop   03/20/23 1200  vancomycin (VANCOCIN) IVPB 1000 mg/200 mL premix        1,000 mg 200 mL/hr over 60 Minutes Intravenous Every 12 hours 03/20/23 0106     03/20/23 0000  ceFEPIme (MAXIPIME) 2 g in sodium chloride 0.9 % 100 mL IVPB        2 g 200 mL/hr over 30 Minutes Intravenous Every 8 hours 03/19/23 2353     03/20/23 0000  vancomycin (VANCOREADY) IVPB 1500 mg/300 mL        1,500 mg 150 mL/hr over 120 Minutes Intravenous  Once 03/19/23 2353 03/20/23 0543   03/20/23 0000  piperacillin-tazobactam (ZOSYN) IVPB 3.375 g  Status:  Discontinued        3.375 g 100 mL/hr over 30 Minutes Intravenous Every 8 hours 03/19/23 2353 03/20/23 0021       Objective: Vitals:   03/20/23 0840 03/20/23 1127 03/20/23 1314 03/20/23 1654  BP: 119/68  103/64 (!) 178/161  Pulse: 61  67 73  Resp: 18  16 18   Temp:   98.4 F (36.9 C) 97.8 F (  36.6 C)  TempSrc:    Oral  SpO2: 96% 95% 96% 96%  Weight: 76 kg     Height: 5\' 8"  (1.727 m)       Intake/Output Summary (Last 24 hours) at 03/20/2023 1740 Last data filed at 03/20/2023 1053 Gross per 24 hour  Intake 873.98 ml  Output --  Net 873.98 ml   Filed Weights   03/19/23 2035 03/20/23 0840  Weight: 74.6 kg 76 kg   Data Reviewed: I have personally reviewed following labs and imaging studies  CBC: Recent Labs  Lab 03/19/23 2050 03/20/23 0906  WBC 11.4* 6.4  NEUTROABS 9.8* 5.0  HGB 14.0 13.5  HCT 40.4 40.3  MCV 96.2 98.5  PLT 59* 51*   Basic Metabolic Panel: Recent Labs  Lab  03/19/23 2050 03/20/23 0906 03/20/23 0939  NA 138 139  --   K 3.4* 3.8  --   CL 103 106  --   CO2 24 25  --   GLUCOSE 174* 86  --   BUN 18 16  --   CREATININE 0.80 0.57*  --   CALCIUM 9.1 8.6*  --   MG  --  2.0  --   PHOS  --   --  2.9   GFR: Estimated Creatinine Clearance: 85.5 mL/min (A) (by C-G formula based on SCr of 0.57 mg/dL (L)). Liver Function Tests: Recent Labs  Lab 03/19/23 2050 03/20/23 0939  AST 30 24  ALT 24 20  ALKPHOS 48 42  BILITOT 0.8 1.1  PROT 7.2 5.8*  ALBUMIN 4.1 3.5   No results for input(s): "LIPASE", "AMYLASE" in the last 168 hours. No results for input(s): "AMMONIA" in the last 168 hours. Coagulation Profile: No results for input(s): "INR", "PROTIME" in the last 168 hours. Cardiac Enzymes: No results for input(s): "CKTOTAL", "CKMB", "CKMBINDEX", "TROPONINI" in the last 168 hours. BNP (last 3 results) No results for input(s): "PROBNP" in the last 8760 hours. HbA1C: No results for input(s): "HGBA1C" in the last 72 hours. CBG: No results for input(s): "GLUCAP" in the last 168 hours. Lipid Profile: No results for input(s): "CHOL", "HDL", "LDLCALC", "TRIG", "CHOLHDL", "LDLDIRECT" in the last 72 hours. Thyroid Function Tests: No results for input(s): "TSH", "T4TOTAL", "FREET4", "T3FREE", "THYROIDAB" in the last 72 hours. Anemia Panel: No results for input(s): "VITAMINB12", "FOLATE", "FERRITIN", "TIBC", "IRON", "RETICCTPCT" in the last 72 hours. Sepsis Labs: Recent Labs  Lab 03/19/23 2050  LATICACIDVEN 1.6   Recent Results (from the past 240 hour(s))  Blood culture (routine x 2)     Status: None (Preliminary result)   Collection Time: 03/19/23  8:50 PM   Specimen: BLOOD  Result Value Ref Range Status   Specimen Description   Final    BLOOD SITE NOT SPECIFIED Performed at [facility], [address], [place], [place] [address]    Special Requests   Final    BOTTLES DRAWN AEROBIC AND ANAEROBIC Blood Culture adequate  volume Performed at [facility], [address] [place], [place] [address]    Culture   Final    NO GROWTH < 12 HOURS Performed at [facility], [address], [place], [place] [address]    Report Status PENDING  Incomplete    Radiology Studies: DG Hand Complete Right  Result Date: 03/19/2023 CLINICAL DATA:  Patient fell on concrete 2 days ago and cut hand. Laceration to palmar surface. Entire and is hot and swollen. Swelling extending up to arm to elbow. Unable  to turn arm AP due to pain and swelling. EXAM: RIGHT HAND - COMPLETE 3+ VIEW; RIGHT FOREARM - 2 VIEW COMPARISON:  Wrist radiographs 03/19/2023 at 6:22 p.m. FINDINGS: Redemonstrated subcutaneous gas about the radial aspect of the proximal hand. Tiny associated hyperdensity in this area could represent a foreign body. Diffuse soft tissue swelling about the hand and wrist. No acute fracture. IMPRESSION: 1. Redemonstrated subcutaneous gas about the radial aspect of the proximal hand. Tiny associated hyperdensity in this area could represent a foreign body. Correlate with site of laceration. 2. Diffuse soft tissue swelling about the hand and wrist. 3. No acute fracture. Electronically Signed   By: Minerva Fester M.D.   On: 03/19/2023 22:34   DG Forearm Right  Result Date: 03/19/2023 CLINICAL DATA:  Patient fell on concrete 2 days ago and cut hand. Laceration to palmar surface. Entire and is hot and swollen. Swelling extending up to arm to elbow. Unable to turn arm AP due to pain and swelling. EXAM: RIGHT HAND - COMPLETE 3+ VIEW; RIGHT FOREARM - 2 VIEW COMPARISON:  Wrist radiographs 03/19/2023 at 6:22 p.m. FINDINGS: Redemonstrated subcutaneous gas about the radial aspect of the proximal hand. Tiny associated hyperdensity in this area could represent a foreign body. Diffuse soft tissue swelling about the hand and wrist. No acute fracture. IMPRESSION: 1. Redemonstrated subcutaneous gas about the radial aspect of the  proximal hand. Tiny associated hyperdensity in this area could represent a foreign body. Correlate with site of laceration. 2. Diffuse soft tissue swelling about the hand and wrist. 3. No acute fracture. Electronically Signed   By: Minerva Fester M.D.   On: 03/19/2023 22:34   DG Wrist 2 Views Right  Result Date: 03/19/2023 CLINICAL DATA:  Fall, pain, swelling EXAM: RIGHT WRIST - 2 VIEW COMPARISON:  None Available. FINDINGS: No acute bony abnormality. Specifically, no fracture, subluxation, or dislocation. There appears to be a few locules of gas within the soft tissues overlying the snuffbox. Recommend correlation for laceration in this area. IMPRESSION: No visible acute bony abnormality. Small apparent air locules in the soft tissues overlying the snuffbox. This may be related to laceration. Electronically Signed   By: Charlett Nose M.D.   On: 03/19/2023 19:58   DG Elbow 2 Views Right  Result Date: 03/19/2023 CLINICAL DATA:  Fall EXAM: RIGHT ELBOW - 2 VIEW COMPARISON:  None Available. FINDINGS: There is no evidence of fracture, dislocation, or joint effusion. There is no evidence of arthropathy or other focal bone abnormality. Soft tissues are unremarkable. IMPRESSION: Negative. Electronically Signed   By: Charlett Nose M.D.   On: 03/19/2023 19:57    Scheduled Meds:  atorvastatin  20 mg Oral QHS   busPIRone  15 mg Oral TID   divalproex  500 mg Oral TID   fluticasone furoate-vilanterol  1 puff Inhalation Daily   And   umeclidinium bromide  1 puff Inhalation Daily   lactulose  20 g Oral BID   lisinopril  5 mg Oral Daily   metoprolol succinate  12.5 mg Oral Daily   oxcarbazepine  600 mg Oral QPM   prazosin  1 mg Oral q morning   QUEtiapine  200 mg Oral QHS   sertraline  200 mg Oral Daily   Continuous Infusions:  ceFEPime (MAXIPIME) IV 2 g (03/20/23 1721)   vancomycin 1,000 mg (03/20/23 1141)    LOS: 0 days   Marguerita Merles, DO [facility] Available via Epic secure chat  7am-7pm After these hours, please refer to  coverage provider listed on [email or web address] 03/20/2023, 5:40 PM

## 2023-03-21 DIAGNOSIS — J449 Chronic obstructive pulmonary disease, unspecified: Secondary | ICD-10-CM | POA: Diagnosis not present

## 2023-03-21 DIAGNOSIS — L03113 Cellulitis of right upper limb: Secondary | ICD-10-CM | POA: Diagnosis not present

## 2023-03-21 DIAGNOSIS — I5032 Chronic diastolic (congestive) heart failure: Secondary | ICD-10-CM | POA: Diagnosis not present

## 2023-03-21 DIAGNOSIS — F418 Other specified anxiety disorders: Secondary | ICD-10-CM | POA: Diagnosis not present

## 2023-03-21 LAB — CBC
HCT: 37.4 % — ABNORMAL LOW (ref 39.0–52.0)
Hemoglobin: 12.5 g/dL — ABNORMAL LOW (ref 13.0–17.0)
MCH: 32.7 pg (ref 26.0–34.0)
MCHC: 33.4 g/dL (ref 30.0–36.0)
MCV: 97.9 fL (ref 80.0–100.0)
Platelets: 56 10*3/uL — ABNORMAL LOW (ref 150–400)
RBC: 3.82 MIL/uL — ABNORMAL LOW (ref 4.22–5.81)
RDW: 14.3 % (ref 11.5–15.5)
WBC: 4.7 10*3/uL (ref 4.0–10.5)
nRBC: 0 % (ref 0.0–0.2)

## 2023-03-21 LAB — BASIC METABOLIC PANEL
Anion gap: 11 (ref 5–15)
BUN: 13 mg/dL (ref 8–23)
CO2: 24 mmol/L (ref 22–32)
Calcium: 8.5 mg/dL — ABNORMAL LOW (ref 8.9–10.3)
Chloride: 101 mmol/L (ref 98–111)
Creatinine, Ser: 0.56 mg/dL — ABNORMAL LOW (ref 0.61–1.24)
GFR, Estimated: >60 mL/min (ref >60)
Glucose, Bld: 89 mg/dL (ref 70–99)
Potassium: 3.4 mmol/L — ABNORMAL LOW (ref 3.5–5.1)
Sodium: 136 mmol/L (ref 135–145)

## 2023-03-21 MED ORDER — POTASSIUM CHLORIDE CRYS ER 20 MEQ PO TBCR
40.0000 meq | EXTENDED_RELEASE_TABLET | Freq: Two times a day (BID) | ORAL | Status: AC
  Administered 2023-03-21 (×2): 40 meq via ORAL
  Filled 2023-03-21 (×2): qty 2

## 2023-03-21 NOTE — Progress Notes (Signed)
PROGRESS NOTE    [name]  ZOX:[id number] DOB: 1956-05-12 DOA: 03/19/2023 PCP: [name], MD   Brief Narrative:  Patient is a 67 year old overweight Caucasian male with a past medical history significant for but not limited to alcoholism in remission, hepatitis C status posttreatment with Harvoni, cirrhosis, hypertension, seizure disorder, depression and anxiety as well as COPD who presented with right forearm pain and swelling but mainly it was in his hand.  He reports that he stopped 2 to 3 days ago and suffered some minor lacerations near the wrist and subsequently since then he developed progressive pain and swelling as well as redness to to the hand and wrist.  The pain became so severe that he presented to the ED for further evaluation.  X-Ray of the hand demonstrated tissue swelling of the right hand with subcutaneous gas about the radial aspect of the proximal hand with questionable tiny foreign body.  Blood cultures were collected in ED and he was placed on IV antibiotics of vancomycin and cefepime.  We have ordered an MRI for further evaluation and asked hand surgery consult.  MRI as below and hand surgery feels that there is no definite area to surgically drained and recommending broad-spectrum antibiotics and blood sugar control as well as elevation and cleaning of the hand.  Assessment and Plan:  RUE and Hand Cellulitis  -Continue treatment with vancomycin and cefepime, follow cultures and clinical course,  -MRI done and showed "There is metallic susceptibility artifact within the soft tissues lateral and slightly volar to the distal scaphoid, corresponding to the punctate 1 mm metallic density seen on radiographs. On recent radiographs there are also foci of air within the soft tissues in this region. There appears to be thinning of the overlying skin, suggesting a laceration from recent trauma. Recommend clinical correlation. There is a mild-to-moderate radiocarpal joint  effusion extending dorsally. There is moderate dorsal hand soft tissue swelling with more mild edema seen throughout the entire hand and fingers, compatible with cellulitis. There appear to be small erosions at the medial aspect of the triquetrum and the medial and lateral aspects of the second through fifth metacarpal  heads. There is no associated marrow edema, and, if real, these erosions of acute osteomyelitis. This may be secondary to an inflammatory arthropathy. No definite evidence of acute osteomyelitis. There is mild-to-moderate second dorsal extensor compartment tenosynovitis." -Hand Surgery Dr. Izora Ribas consulted and feels there is no definite area to surgically drain and recommending continuing broad-spectrum antibiotics, blood sugar control as well as elevating the hand and washing the abrasion with soap and water and applying antibiotic ointment   COPD  -Not in exacerbation  -Continue Breo Ellipta and Incruse Ellipta 1 puff IH Daily   Cirrhosis  -Appears compensated   -Continue Lactulose 20 grams po BID   Hypertension  -Continue Lisinopril 5 mg po Daily and Metoprolol Succinate 12.5 mg po Daily -Continue to Monitor BP per Protocol -Last BP reading was    Seizures  -Continue Depakote and Trileptal   -Seizure Precautions   Depression, Anxiety  -Continue Buspirone 15 mg po TID, Oxcarbazepine 600 mg po Daily, Divalproex 500 mg po TID, Sertraline 200 mg po Daily, Quetiapine 200 mg po at bedtime and Trazodone 200 mg po at bedtime prn Sleep  Normocytic Anemia -Hgb/Hct Trend: Recent Labs  Lab 03/19/23 2050 03/20/23 0906 03/21/23 0546  HGB 14.0 13.5 12.5*  HCT 40.4 40.3 37.4*  MCV 96.2 98.5 97.9  -Check Anemia Panel in the AM -Continue  to Monitor for S/Sx of Bleeding; No overt bleeding noted -Repeat CBC in the AM  Hypokalemia -Patient's K+ Level Trend: Recent Labs  Lab 03/19/23 2050 03/20/23 0906 03/21/23 0546  K 3.4* 3.8 3.4*  -Replete with po KCL 40 mEQ BID  x2 -Continue to Monitor and Replete as Necessary -Repeat CMP in the AM   Thrombocytopenia  -Appears chronic and stable  -Likely related to liver cirrhosis, splenomegaly  Recent Labs  Lab 03/19/23 2050 03/20/23 0906 03/21/23 0546  PLT 59* 51* 56*  -Continue to Monitor and Trend and Repeat CBC in the AM   DVT prophylaxis: SCDs Start: 03/20/23 0021    Code Status: Full Code Family Communication: No family present at bedside  Disposition Plan:  Level of care: Med-Surg Status is: Inpatient Remains inpatient appropriate because: His further clinical improvement in his hand   Consultants:  Hand surgery Dr. Izora Ribas  Procedures:  As delineated as above  Antimicrobials:  Anti-infectives (From admission, onward)    Start     Dose/Rate Route Frequency Ordered Stop   03/20/23 1200  vancomycin (VANCOCIN) IVPB 1000 mg/200 mL premix        1,000 mg 200 mL/hr over 60 Minutes Intravenous Every 12 hours 03/20/23 0106     03/20/23 0000  ceFEPIme (MAXIPIME) 2 g in sodium chloride 0.9 % 100 mL IVPB        2 g 200 mL/hr over 30 Minutes Intravenous Every 8 hours 03/19/23 2353     03/20/23 0000  vancomycin (VANCOREADY) IVPB 1500 mg/300 mL        1,500 mg 150 mL/hr over 120 Minutes Intravenous  Once 03/19/23 2353 03/20/23 0543   03/20/23 0000  piperacillin-tazobactam (ZOSYN) IVPB 3.375 g  Status:  Discontinued        3.375 g 100 mL/hr over 30 Minutes Intravenous Every 8 hours 03/19/23 2353 03/20/23 0021       Subjective: Seen and examined at bedside and the patient was doing okay but still complaining of some wrist and hand pain.  Difficult for him to make a fist.  No nausea or vomiting.  Denies any lightheadedness or dizziness.  Objective: Vitals:   03/20/23 2007 03/21/23 0440 03/21/23 0823 03/21/23 1251  BP: 115/78 128/85 (!) 112/59 116/65  Pulse: 72 72 (!) 59 63  Resp: 18 18 18 20   Temp: 98.3 F (36.8 C) 98.3 F (36.8 C) 98 F (36.7 C) 98.4 F (36.9 C)  TempSrc: Oral Oral Oral  Oral  SpO2: 97% 97% 96% 94%  Weight:      Height:        Intake/Output Summary (Last 24 hours) at 03/21/2023 1419 Last data filed at 03/21/2023 1610 Gross per 24 hour  Intake 356 ml  Output 300 ml  Net 56 ml   Filed Weights   03/19/23 2035 03/20/23 0840  Weight: 74.6 kg 76 kg   Examination: Physical Exam:  Constitutional: WN/WD overweight Caucasian male who appears disheveled Respiratory: Diminished to auscultation bilaterally, no wheezing, rales, rhonchi or crackles. Normal respiratory effort and patient is not tachypenic. No accessory muscle use.  Unlabored breathing Cardiovascular: RRR, no murmurs / rubs / gallops. S1 and S2 auscultated.  Right hand is extremely swollen compared to left Abdomen: Soft, non-tender, mildly distended.  Bowel sounds positive.  GU: Deferred. Musculoskeletal: Right hand is extremely swollen and is slightly erythematous and patient has multiple diffusely scattered tattoos throughout his bodies Skin: Multiple tattoos and an abrasion in his thenar area Neurologic: CN 2-12 grossly intact  with no focal deficits. Romberg sign and cerebellar reflexes not assessed.  Psychiatric: Normal judgment and insight. Alert and oriented x 3. Normal mood and appropriate affect.   Data Reviewed: I have personally reviewed following labs and imaging studies  CBC: Recent Labs  Lab 03/19/23 2050 03/20/23 0906 03/21/23 0546  WBC 11.4* 6.4 4.7  NEUTROABS 9.8* 5.0  --   HGB 14.0 13.5 12.5*  HCT 40.4 40.3 37.4*  MCV 96.2 98.5 97.9  PLT 59* 51* 56*   Basic Metabolic Panel: Recent Labs  Lab 03/19/23 2050 03/20/23 0906 03/20/23 0939 03/21/23 0546  NA 138 139  --  136  K 3.4* 3.8  --  3.4*  CL 103 106  --  101  CO2 24 25  --  24  GLUCOSE 174* 86  --  89  BUN 18 16  --  13  CREATININE 0.80 0.57*  --  0.56*  CALCIUM 9.1 8.6*  --  8.5*  MG  --  2.0  --   --   PHOS  --   --  2.9  --    GFR: Estimated Creatinine Clearance: 85.5 mL/min (A) (by C-G formula based  on SCr of 0.56 mg/dL (L)). Liver Function Tests: Recent Labs  Lab 03/19/23 2050 03/20/23 0939  AST 30 24  ALT 24 20  ALKPHOS 48 42  BILITOT 0.8 1.1  PROT 7.2 5.8*  ALBUMIN 4.1 3.5   No results for input(s): "LIPASE", "AMYLASE" in the last 168 hours. No results for input(s): "AMMONIA" in the last 168 hours. Coagulation Profile: No results for input(s): "INR", "PROTIME" in the last 168 hours. Cardiac Enzymes: No results for input(s): "CKTOTAL", "CKMB", "CKMBINDEX", "TROPONINI" in the last 168 hours. BNP (last 3 results) No results for input(s): "PROBNP" in the last 8760 hours. HbA1C: No results for input(s): "HGBA1C" in the last 72 hours. CBG: No results for input(s): "GLUCAP" in the last 168 hours. Lipid Profile: No results for input(s): "CHOL", "HDL", "LDLCALC", "TRIG", "CHOLHDL", "LDLDIRECT" in the last 72 hours. Thyroid Function Tests: No results for input(s): "TSH", "T4TOTAL", "FREET4", "T3FREE", "THYROIDAB" in the last 72 hours. Anemia Panel: No results for input(s): "VITAMINB12", "FOLATE", "FERRITIN", "TIBC", "IRON", "RETICCTPCT" in the last 72 hours. Sepsis Labs: Recent Labs  Lab 03/19/23 2050  LATICACIDVEN 1.6   Recent Results (from the past 240 hour(s))  Blood culture (routine x 2)     Status: None (Preliminary result)   Collection Time: 03/19/23  8:50 PM   Specimen: BLOOD  Result Value Ref Range Status   Specimen Description   Final    BLOOD SITE NOT SPECIFIED Performed at [facility], [address], [place], [place] [address]    Special Requests   Final    BOTTLES DRAWN AEROBIC AND ANAEROBIC Blood Culture adequate volume Performed at [facility], [address], [place], [place] [address]    Culture   Final    NO GROWTH 2 DAYS Performed at [facility], [address], [place], [place] [address]    Report Status PENDING  Incomplete  Blood culture (routine x 2)     Status: None (Preliminary result)    Collection Time: 03/19/23  9:19 PM   Specimen: BLOOD  Result Value Ref Range Status   Specimen Description   Final    BLOOD RIGHT ANTECUBITAL Performed at [facility], [address] [place], [place] [address]    Special Requests   Final    BOTTLES  DRAWN AEROBIC AND ANAEROBIC Blood Culture adequate volume Performed at [facility], [address] [place], [place] [address]    Culture   Final    NO GROWTH 1 DAY Performed at [facility], [address] [place], [place] [address]    Report Status PENDING  Incomplete    Radiology Studies: MR HAND RIGHT W WO CONTRAST  Result Date: 03/20/2023 CLINICAL DATA:  Larey Seat 4 days ago.  Pain and swelling of right hand. EXAM: MRI OF THE RIGHT HAND WITHOUT AND WITH CONTRAST TECHNIQUE: Multiplanar, multisequence MR imaging of the right hand was performed before and after the administration of intravenous contrast. CONTRAST:  8 mL Gadavist gadolinium IV COMPARISON:  Right hand radiographs 03/19/2023, right wrist radiographs 01/08/2022 FINDINGS: Despite efforts by the technologist and patient, mild motion artifact is present on today's exam and could not be eliminated. This reduces exam sensitivity and specificity. Bones/Joint/Cartilage Mild-to-moderate cervical intercarpal cartilage thinning and peripheral osteophytosis. Mild triscaphe cartilage thinning and peripheral osteophytosis. Moderate thumb metacarpophalangeal cartilage thinning, joint space narrowing, and peripheral osteophytosis. There appear to be small erosions at the medial aspect of the triquetrum (coronal series 6, image 11) and the medial and lateral aspects of the second through fifth metacarpal heads (coronal series 6 images 14 through 22). There is a mild-to-moderate radiocarpal joint effusion extending dorsally (axial series 5, image 40 and coronal series 7, image 14). Mild-to-moderate fluid within the distal radioulnar joint. Ligaments The  metacarpophalangeal collateral ligaments appear intact. Muscles and Tendons The flexor tendons appear intact. There is mild-to-moderate second dorsal extensor compartment tenosynovitis. Soft tissues There is metallic susceptibility artifact within the soft tissues lateral and slightly volar to the distal scaphoid (coronal image 8, axial series 5, image 38, sagittal image 30/44), corresponding to the punctate 1 mm metallic density seen on radiographs. On radiographs there also were multiple locules of subcutaneous gas which likely also cause some blooming artifact lateral to the scaphoid. Within the limitations of metallic artifact, question some irregularity at the skin surface lateral/superficial to the metallic artifact, possibly a soft tissue laceration. Recommend clinical correlation. Mild soft tissue edema and swelling in this region. There is moderate dorsal hand soft tissue swelling greatest at the level of the length of the metacarpals. IMPRESSION: 1. There is metallic susceptibility artifact within the soft tissues lateral and slightly volar to the distal scaphoid, corresponding to the punctate 1 mm metallic density seen on radiographs. On recent radiographs there are also foci of air within the soft tissues in this region. There appears to be thinning of the overlying skin, suggesting a laceration from recent trauma. Recommend clinical correlation. 2. There is a mild-to-moderate radiocarpal joint effusion extending dorsally. 3. There is moderate dorsal hand soft tissue swelling with more mild edema seen throughout the entire hand and fingers, compatible with cellulitis. 4. There appear to be small erosions at the medial aspect of the triquetrum and the medial and lateral aspects of the second through fifth metacarpal heads. There is no associated marrow edema, and, if real, these erosions of acute osteomyelitis. This may be secondary to an inflammatory arthropathy. No definite evidence of acute  osteomyelitis. 5. There is mild-to-moderate second dorsal extensor compartment tenosynovitis. Electronically Signed   By: Neita Garnet M.D.   On: 03/20/2023 22:39   DG Hand Complete Right  Result Date: 03/19/2023 CLINICAL DATA:  Patient fell on concrete 2 days ago and cut hand. Laceration to palmar surface. Entire and is hot and swollen. Swelling extending  up to arm to elbow. Unable to turn arm AP due to pain and swelling. EXAM: RIGHT HAND - COMPLETE 3+ VIEW; RIGHT FOREARM - 2 VIEW COMPARISON:  Wrist radiographs 03/19/2023 at 6:22 p.m. FINDINGS: Redemonstrated subcutaneous gas about the radial aspect of the proximal hand. Tiny associated hyperdensity in this area could represent a foreign body. Diffuse soft tissue swelling about the hand and wrist. No acute fracture. IMPRESSION: 1. Redemonstrated subcutaneous gas about the radial aspect of the proximal hand. Tiny associated hyperdensity in this area could represent a foreign body. Correlate with site of laceration. 2. Diffuse soft tissue swelling about the hand and wrist. 3. No acute fracture. Electronically Signed   By: Minerva Fester M.D.   On: 03/19/2023 22:34   DG Forearm Right  Result Date: 03/19/2023 CLINICAL DATA:  Patient fell on concrete 2 days ago and cut hand. Laceration to palmar surface. Entire and is hot and swollen. Swelling extending up to arm to elbow. Unable to turn arm AP due to pain and swelling. EXAM: RIGHT HAND - COMPLETE 3+ VIEW; RIGHT FOREARM - 2 VIEW COMPARISON:  Wrist radiographs 03/19/2023 at 6:22 p.m. FINDINGS: Redemonstrated subcutaneous gas about the radial aspect of the proximal hand. Tiny associated hyperdensity in this area could represent a foreign body. Diffuse soft tissue swelling about the hand and wrist. No acute fracture. IMPRESSION: 1. Redemonstrated subcutaneous gas about the radial aspect of the proximal hand. Tiny associated hyperdensity in this area could represent a foreign body. Correlate with site of  laceration. 2. Diffuse soft tissue swelling about the hand and wrist. 3. No acute fracture. Electronically Signed   By: Minerva Fester M.D.   On: 03/19/2023 22:34   DG Wrist 2 Views Right  Result Date: 03/19/2023 CLINICAL DATA:  Fall, pain, swelling EXAM: RIGHT WRIST - 2 VIEW COMPARISON:  None Available. FINDINGS: No acute bony abnormality. Specifically, no fracture, subluxation, or dislocation. There appears to be a few locules of gas within the soft tissues overlying the snuffbox. Recommend correlation for laceration in this area. IMPRESSION: No visible acute bony abnormality. Small apparent air locules in the soft tissues overlying the snuffbox. This may be related to laceration. Electronically Signed   By: Charlett Nose M.D.   On: 03/19/2023 19:58   DG Elbow 2 Views Right  Result Date: 03/19/2023 CLINICAL DATA:  Fall EXAM: RIGHT ELBOW - 2 VIEW COMPARISON:  None Available. FINDINGS: There is no evidence of fracture, dislocation, or joint effusion. There is no evidence of arthropathy or other focal bone abnormality. Soft tissues are unremarkable. IMPRESSION: Negative. Electronically Signed   By: Charlett Nose M.D.   On: 03/19/2023 19:57    Scheduled Meds:  atorvastatin  20 mg Oral QHS   busPIRone  15 mg Oral TID   divalproex  500 mg Oral TID   fluticasone furoate-vilanterol  1 puff Inhalation Daily   And   umeclidinium bromide  1 puff Inhalation Daily   lactulose  20 g Oral BID   lisinopril  5 mg Oral Daily   metoprolol succinate  12.5 mg Oral Daily   oxcarbazepine  600 mg Oral QPM   potassium chloride  40 mEq Oral BID   prazosin  1 mg Oral q morning   QUEtiapine  200 mg Oral QHS   sertraline  200 mg Oral Daily   Continuous Infusions:  ceFEPime (MAXIPIME) IV 2 g (03/21/23 0839)   vancomycin 1,000 mg (03/21/23 1148)    LOS: 1 day   Marguerita Merles, DO  [facility] Available via Epic secure chat 7am-7pm After these hours, please refer to coverage provider listed on  [email or web address] 03/21/2023, 2:19 PM
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
## 2023-03-22 DIAGNOSIS — F418 Other specified anxiety disorders: Secondary | ICD-10-CM | POA: Diagnosis not present

## 2023-03-22 DIAGNOSIS — J449 Chronic obstructive pulmonary disease, unspecified: Secondary | ICD-10-CM | POA: Diagnosis not present

## 2023-03-22 DIAGNOSIS — I5032 Chronic diastolic (congestive) heart failure: Secondary | ICD-10-CM | POA: Diagnosis not present

## 2023-03-22 DIAGNOSIS — L03113 Cellulitis of right upper limb: Secondary | ICD-10-CM | POA: Diagnosis not present

## 2023-03-22 LAB — COMPREHENSIVE METABOLIC PANEL
ALT: 31 U/L (ref 0–44)
AST: 32 U/L (ref 15–41)
Albumin: 3.1 g/dL — ABNORMAL LOW (ref 3.5–5.0)
Alkaline Phosphatase: 62 U/L (ref 38–126)
Anion gap: 10 (ref 5–15)
BUN: 10 mg/dL (ref 8–23)
CO2: 25 mmol/L (ref 22–32)
Calcium: 8.8 mg/dL — ABNORMAL LOW (ref 8.9–10.3)
Chloride: 102 mmol/L (ref 98–111)
Creatinine, Ser: 0.57 mg/dL — ABNORMAL LOW (ref 0.61–1.24)
GFR, Estimated: >60 mL/min (ref >60)
Glucose, Bld: 93 mg/dL (ref 70–99)
Potassium: 3.6 mmol/L (ref 3.5–5.1)
Sodium: 137 mmol/L (ref 135–145)
Total Bilirubin: 1.1 mg/dL (ref 0.3–1.2)
Total Protein: 5.8 g/dL — ABNORMAL LOW (ref 6.5–8.1)

## 2023-03-22 LAB — CBC WITH DIFFERENTIAL/PLATELET
Abs Immature Granulocytes: 0.04 10*3/uL (ref 0.00–0.07)
Basophils Absolute: 0 10*3/uL (ref 0.0–0.1)
Basophils Relative: 1 %
Eosinophils Absolute: 0.1 10*3/uL (ref 0.0–0.5)
Eosinophils Relative: 2 %
HCT: 36.7 % — ABNORMAL LOW (ref 39.0–52.0)
Hemoglobin: 12.6 g/dL — ABNORMAL LOW (ref 13.0–17.0)
Immature Granulocytes: 1 %
Lymphocytes Relative: 13 %
Lymphs Abs: 0.7 10*3/uL (ref 0.7–4.0)
MCH: 33.1 pg (ref 26.0–34.0)
MCHC: 34.3 g/dL (ref 30.0–36.0)
MCV: 96.3 fL (ref 80.0–100.0)
Monocytes Absolute: 0.9 10*3/uL (ref 0.1–1.0)
Monocytes Relative: 17 %
Neutro Abs: 3.3 10*3/uL (ref 1.7–7.7)
Neutrophils Relative %: 66 %
Platelets: 63 10*3/uL — ABNORMAL LOW (ref 150–400)
RBC: 3.81 MIL/uL — ABNORMAL LOW (ref 4.22–5.81)
RDW: 14.1 % (ref 11.5–15.5)
WBC: 4.9 10*3/uL (ref 4.0–10.5)
nRBC: 0 % (ref 0.0–0.2)

## 2023-03-22 LAB — MAGNESIUM: Magnesium: 2.1 mg/dL (ref 1.7–2.4)

## 2023-03-22 LAB — PHOSPHORUS: Phosphorus: 3.4 mg/dL (ref 2.5–4.6)

## 2023-03-22 MED ORDER — ALUM & MAG HYDROXIDE-SIMETH 200-200-20 MG/5ML PO SUSP
30.0000 mL | ORAL | Status: DC | PRN
  Administered 2023-03-22: 30 mL via ORAL
  Filled 2023-03-22: qty 30

## 2023-03-22 MED ORDER — POTASSIUM CHLORIDE CRYS ER 20 MEQ PO TBCR
40.0000 meq | EXTENDED_RELEASE_TABLET | Freq: Once | ORAL | Status: AC
  Administered 2023-03-22: 40 meq via ORAL
  Filled 2023-03-22: qty 2

## 2023-03-22 NOTE — Progress Notes (Signed)
PROGRESS NOTE    [name]  ZOX:[id number] DOB: 1955-10-31 DOA: 03/19/2023 PCP: [name], MD   Brief Narrative:  Patient is a 67 year old overweight Caucasian male with a past medical history significant for but not limited to alcoholism in remission, hepatitis C status posttreatment with Harvoni, cirrhosis, hypertension, seizure disorder, depression and anxiety as well as COPD who presented with right forearm pain and swelling but mainly it was in his hand.  He reports that he stopped 2 to 3 days ago and suffered some minor lacerations near the wrist and subsequently since then he developed progressive pain and swelling as well as redness to to the hand and wrist.  The pain became so severe that he presented to the ED for further evaluation.  X-Ray of the hand demonstrated tissue swelling of the right hand with subcutaneous gas about the radial aspect of the proximal hand with questionable tiny foreign body.  Blood cultures were collected in ED and he was placed on IV antibiotics of vancomycin and cefepime.  We have ordered an MRI for further evaluation and asked hand surgery consult.  MRI as below and hand surgery feels that there is no definite area to surgically drained and recommending broad-spectrum antibiotics and blood sugar control as well as elevation and cleaning of the hand.  Assessment and Plan:  RUE and Hand Cellulitis, slowly improving  -Continue treatment with vancomycin and cefepime, follow cultures and clinical course,  -MRI done and showed "There is metallic susceptibility artifact within the soft tissues lateral and slightly volar to the distal scaphoid, corresponding to the punctate 1 mm metallic density seen on radiographs. On recent radiographs there are also foci of air within the soft tissues in this region. There appears to be thinning of the overlying skin, suggesting a laceration from recent trauma. Recommend clinical correlation. There is a mild-to-moderate  radiocarpal joint effusion extending dorsally. There is moderate dorsal hand soft tissue swelling with more mild edema seen throughout the entire hand and fingers, compatible with cellulitis. There appear to be small erosions at the medial aspect of the triquetrum and the medial and lateral aspects of the second through fifth metacarpal  heads. There is no associated marrow edema, and, if real, these erosions of acute osteomyelitis. This may be secondary to an inflammatory arthropathy. No definite evidence of acute osteomyelitis. There is mild-to-moderate second dorsal extensor compartment tenosynovitis." -Hand Surgery Dr. Izora Ribas consulted and feels there is no definite area to surgically drain and recommending continuing broad-spectrum antibiotics, blood sugar control as well as elevating the hand and washing the abrasion with soap and water and applying antibiotic ointment   COPD  -Not in exacerbation  -Continue Breo Ellipta and Incruse Ellipta 1 puff IH Daily   Cirrhosis  -Appears compensated   -Continue Lactulose 20 grams po BID -LFTs wnl   Hypertension  -Continue Lisinopril 5 mg po Daily and Metoprolol Succinate 12.5 mg po Daily -Continue to Monitor BP per Protocol -Last BP reading was 112/77   Seizures  -Continue Divalproex 500 mg po TID and Oxcarbazepine 600 mg po qHS -Seizure Precautions   Depression, Anxiety  -Continue Buspirone 15 mg po TID, Oxcarbazepine 600 mg po Daily, Divalproex 500 mg po TID, Sertraline 200 mg po Daily, Quetiapine 200 mg po at bedtime and Trazodone 200 mg po at bedtime prn Sleep  Normocytic Anemia -Hgb/Hct Trend: Recent Labs  Lab 03/19/23 2050 03/20/23 0906 03/21/23 0546 03/22/23 0537  HGB 14.0 13.5 12.5* 12.6*  HCT 40.4 40.3 37.4*  36.7*  MCV 96.2 98.5 97.9 96.3  -Check Anemia Panel in the AM -Continue to Monitor for S/Sx of Bleeding; No overt bleeding noted -Repeat CBC in the AM  Hypokalemia -Patient's K+ Level Trend: Recent Labs  Lab  03/19/23 2050 03/20/23 0906 03/21/23 0546 03/22/23 0537  K 3.4* 3.8 3.4* 3.6  -Replete with po KCL 40 mEQ x1 -Continue to Monitor and Replete as Necessary -Repeat CMP in the AM   Thrombocytopenia  -Appears chronic and stable  -Likely related to liver cirrhosis, splenomegaly  Recent Labs  Lab 03/19/23 2050 03/20/23 0906 03/21/23 0546 03/22/23 0537  PLT 59* 51* 56* 63*  -Continue to Monitor and Trend and Repeat CBC in the AM  Hypoalbuminemia -Patient's Albumin Trend: Recent Labs  Lab 03/19/23 2050 03/20/23 0939 03/22/23 0537  ALBUMIN 4.1 3.5 3.1*  -Continue to Monitor and Trend and repeat CMP in the AM      DVT prophylaxis: SCDs Start: 03/20/23 0021    Code Status: Full Code Family Communication:   Disposition Plan:  Level of care: Med-Surg Status is: Inpatient Remains inpatient appropriate because: Needs further clinical improvement in his hand   Consultants:  Hand surgery  Procedures:  As delineated above  Antimicrobials:  Anti-infectives (From admission, onward)    Start     Dose/Rate Route Frequency Ordered Stop   03/20/23 1200  vancomycin (VANCOCIN) IVPB 1000 mg/200 mL premix        1,000 mg 200 mL/hr over 60 Minutes Intravenous Every 12 hours 03/20/23 0106     03/20/23 0000  ceFEPIme (MAXIPIME) 2 g in sodium chloride 0.9 % 100 mL IVPB        2 g 200 mL/hr over 30 Minutes Intravenous Every 8 hours 03/19/23 2353     03/20/23 0000  vancomycin (VANCOREADY) IVPB 1500 mg/300 mL        1,500 mg 150 mL/hr over 120 Minutes Intravenous  Once 03/19/23 2353 03/20/23 0543   03/20/23 0000  piperacillin-tazobactam (ZOSYN) IVPB 3.375 g  Status:  Discontinued        3.375 g 100 mL/hr over 30 Minutes Intravenous Every 8 hours 03/19/23 2353 03/20/23 0021       Subjective: Seen and examined at bedside thinks his hand is improving slowly.  No nausea or vomiting.  Feels okay.  Having very difficult time making a fist.  Hand is less swollen and not as erythematous  today.  Objective: Vitals:   03/22/23 0814 03/22/23 1001 03/22/23 1003 03/22/23 1451  BP:  103/62 103/62 112/77  Pulse:   64 64  Resp:   20 20  Temp:   98 F (36.7 C) 98 F (36.7 C)  TempSrc:   Oral   SpO2: 96%  94% 96%  Weight:      Height:        Intake/Output Summary (Last 24 hours) at 03/22/2023 1703 Last data filed at 03/22/2023 1500 Gross per 24 hour  Intake 1200 ml  Output 2200 ml  Net -1000 ml   Filed Weights   03/19/23 2035 03/20/23 0840  Weight: 74.6 kg 76 kg   Examination: Physical Exam:  Constitutional: WN/WD chronically ill-appearing and disheveled Caucasian male in no acute distress appears to be in some slight pain in the Respiratory: Diminished to auscultation bilaterally, no wheezing, rales, rhonchi or crackles. Normal respiratory effort and patient is not tachypenic. No accessory muscle use.  Unlabored breathing Cardiovascular: RRR, no murmurs / rubs / gallops. S1 and S2 auscultated.   Abdomen: Soft, non-tender,  non-distended. Bowel sounds positive.  GU: Deferred. Musculoskeletal: No clubbing / cyanosis of digits/nails. No joint deformity upper and lower extremities. Skin: Has tattoos diffusely scattered throughout his body in his right hand with some swelling which is improving and some warmth Neurologic: CN 2-12 grossly intact with no focal deficits. Romberg sign and cerebellar reflexes not assessed.  Psychiatric: Normal judgment and insight. Alert and oriented x 3. Normal mood and appropriate affect.   Data Reviewed: I have personally reviewed following labs and imaging studies  CBC: Recent Labs  Lab 03/19/23 2050 03/20/23 0906 03/21/23 0546 03/22/23 0537  WBC 11.4* 6.4 4.7 4.9  NEUTROABS 9.8* 5.0  --  3.3  HGB 14.0 13.5 12.5* 12.6*  HCT 40.4 40.3 37.4* 36.7*  MCV 96.2 98.5 97.9 96.3  PLT 59* 51* 56* 63*   Basic Metabolic Panel: Recent Labs  Lab 03/19/23 2050 03/20/23 0906 03/20/23 0939 03/21/23 0546 03/22/23 0537  NA 138 139  --   136 137  K 3.4* 3.8  --  3.4* 3.6  CL 103 106  --  101 102  CO2 24 25  --  24 25  GLUCOSE 174* 86  --  89 93  BUN 18 16  --  13 10  CREATININE 0.80 0.57*  --  0.56* 0.57*  CALCIUM 9.1 8.6*  --  8.5* 8.8*  MG  --  2.0  --   --  2.1  PHOS  --   --  2.9  --  3.4   GFR: Estimated Creatinine Clearance: 85.5 mL/min (A) (by C-G formula based on SCr of 0.57 mg/dL (L)). Liver Function Tests: Recent Labs  Lab 03/19/23 2050 03/20/23 0939 03/22/23 0537  AST 30 24 32  ALT 24 20 31   ALKPHOS 48 42 62  BILITOT 0.8 1.1 1.1  PROT 7.2 5.8* 5.8*  ALBUMIN 4.1 3.5 3.1*   No results for input(s): "LIPASE", "AMYLASE" in the last 168 hours. No results for input(s): "AMMONIA" in the last 168 hours. Coagulation Profile: No results for input(s): "INR", "PROTIME" in the last 168 hours. Cardiac Enzymes: No results for input(s): "CKTOTAL", "CKMB", "CKMBINDEX", "TROPONINI" in the last 168 hours. BNP (last 3 results) No results for input(s): "PROBNP" in the last 8760 hours. HbA1C: No results for input(s): "HGBA1C" in the last 72 hours. CBG: No results for input(s): "GLUCAP" in the last 168 hours. Lipid Profile: No results for input(s): "CHOL", "HDL", "LDLCALC", "TRIG", "CHOLHDL", "LDLDIRECT" in the last 72 hours. Thyroid Function Tests: No results for input(s): "TSH", "T4TOTAL", "FREET4", "T3FREE", "THYROIDAB" in the last 72 hours. Anemia Panel: No results for input(s): "VITAMINB12", "FOLATE", "FERRITIN", "TIBC", "IRON", "RETICCTPCT" in the last 72 hours. Sepsis Labs: Recent Labs  Lab 03/19/23 2050  LATICACIDVEN 1.6   Recent Results (from the past 240 hour(s))  Blood culture (routine x 2)     Status: None (Preliminary result)   Collection Time: 03/19/23  8:50 PM   Specimen: BLOOD  Result Value Ref Range Status   Specimen Description   Final    BLOOD SITE NOT SPECIFIED Performed at [facility], [address], [place], [place] [address]    Special Requests   Final     BOTTLES DRAWN AEROBIC AND ANAEROBIC Blood Culture adequate volume Performed at [facility], [address], [place], [place] [address]    Culture   Final    NO GROWTH 3 DAYS Performed at [facility], [address], [place], [place] [address]  Report Status PENDING  Incomplete  Blood culture (routine x 2)     Status: None (Preliminary result)   Collection Time: 03/19/23  9:19 PM   Specimen: BLOOD  Result Value Ref Range Status   Specimen Description   Final    BLOOD RIGHT ANTECUBITAL Performed at [facility], [address], [place], [place] [address]    Special Requests   Final    BOTTLES DRAWN AEROBIC AND ANAEROBIC Blood Culture adequate volume Performed at [facility], [address], [place], [place] [address]    Culture   Final    NO GROWTH 2 DAYS Performed at [facility], [address], [place], [place] [address]    Report Status PENDING  Incomplete    Radiology Studies: MR HAND RIGHT W WO CONTRAST  Result Date: 03/20/2023 CLINICAL DATA:  Larey Seat 4 days ago.  Pain and swelling of right hand. EXAM: MRI OF THE RIGHT HAND WITHOUT AND WITH CONTRAST TECHNIQUE: Multiplanar, multisequence MR imaging of the right hand was performed before and after the administration of intravenous contrast. CONTRAST:  8 mL Gadavist gadolinium IV COMPARISON:  Right hand radiographs 03/19/2023, right wrist radiographs 01/08/2022 FINDINGS: Despite efforts by the technologist and patient, mild motion artifact is present on today's exam and could not be eliminated. This reduces exam sensitivity and specificity. Bones/Joint/Cartilage Mild-to-moderate cervical intercarpal cartilage thinning and peripheral osteophytosis. Mild triscaphe cartilage thinning and peripheral osteophytosis. Moderate thumb metacarpophalangeal cartilage thinning, joint space narrowing, and peripheral osteophytosis. There appear to be small erosions at the medial  aspect of the triquetrum (coronal series 6, image 11) and the medial and lateral aspects of the second through fifth metacarpal heads (coronal series 6 images 14 through 22). There is a mild-to-moderate radiocarpal joint effusion extending dorsally (axial series 5, image 40 and coronal series 7, image 14). Mild-to-moderate fluid within the distal radioulnar joint. Ligaments The metacarpophalangeal collateral ligaments appear intact. Muscles and Tendons The flexor tendons appear intact. There is mild-to-moderate second dorsal extensor compartment tenosynovitis. Soft tissues There is metallic susceptibility artifact within the soft tissues lateral and slightly volar to the distal scaphoid (coronal image 8, axial series 5, image 38, sagittal image 30/44), corresponding to the punctate 1 mm metallic density seen on radiographs. On radiographs there also were multiple locules of subcutaneous gas which likely also cause some blooming artifact lateral to the scaphoid. Within the limitations of metallic artifact, question some irregularity at the skin surface lateral/superficial to the metallic artifact, possibly a soft tissue laceration. Recommend clinical correlation. Mild soft tissue edema and swelling in this region. There is moderate dorsal hand soft tissue swelling greatest at the level of the length of the metacarpals. IMPRESSION: 1. There is metallic susceptibility artifact within the soft tissues lateral and slightly volar to the distal scaphoid, corresponding to the punctate 1 mm metallic density seen on radiographs. On recent radiographs there are also foci of air within the soft tissues in this region. There appears to be thinning of the overlying skin, suggesting a laceration from recent trauma. Recommend clinical correlation. 2. There is a mild-to-moderate radiocarpal joint effusion extending dorsally. 3. There is moderate dorsal hand soft tissue swelling with more mild edema seen throughout the entire hand  and fingers, compatible with cellulitis. 4. There appear to be small erosions at the medial aspect of the triquetrum and the medial and lateral aspects of the second through fifth metacarpal heads. There is no associated marrow edema, and, if real, these erosions  of acute osteomyelitis. This may be secondary to an inflammatory arthropathy. No definite evidence of acute osteomyelitis. 5. There is mild-to-moderate second dorsal extensor compartment tenosynovitis. Electronically Signed   By: Neita Garnet M.D.   On: 03/20/2023 22:39    Scheduled Meds:  atorvastatin  20 mg Oral QHS   busPIRone  15 mg Oral TID   divalproex  500 mg Oral TID   fluticasone furoate-vilanterol  1 puff Inhalation Daily   And   umeclidinium bromide  1 puff Inhalation Daily   lactulose  20 g Oral BID   lisinopril  5 mg Oral Daily   metoprolol succinate  12.5 mg Oral Daily   oxcarbazepine  600 mg Oral QPM   potassium chloride  40 mEq Oral Once   prazosin  1 mg Oral q morning   QUEtiapine  200 mg Oral QHS   sertraline  200 mg Oral Daily   Continuous Infusions:  ceFEPime (MAXIPIME) IV 2 g (03/22/23 1637)   vancomycin 1,000 mg (03/22/23 1300)    LOS: 2 days   Marguerita Merles, DO [facility] Available via Epic secure chat 7am-7pm After these hours, please refer to coverage provider listed on [email or web address] 03/22/2023, 5:03 PM

## 2023-03-22 NOTE — Progress Notes (Signed)
Mobility Specialist - Progress Note   03/22/23 1436  Mobility  Activity Ambulated with assistance in hallway  Level of Assistance Standby assist, set-up cues, supervision of patient - no hands on  Assistive Device None  Distance Ambulated (ft) 140 ft  Range of Motion/Exercises Active  Activity Response Tolerated well  Mobility Referral Yes  $Mobility charge 1 Mobility  Mobility Specialist Start Time (ACUTE ONLY) 1415  Mobility Specialist Stop Time (ACUTE ONLY) 1430  Mobility Specialist Time Calculation (min) (ACUTE ONLY) 15 min   Pt received in bed and agreed to mobility, had no issues throughout, had a BM prior to session. Returned to bed with all needs met.  Marilynne Halsted Mobility Specialist

## 2023-03-22 NOTE — Plan of Care (Signed)
  Problem: Education: Goal: Knowledge of the prescribed therapeutic regimen will improve Outcome: Progressing Goal: Understanding of activity limitations/precautions following surgery will improve Outcome: Progressing Goal: Individualized Educational Video(s) Outcome: Progressing   Problem: Activity: Goal: Ability to tolerate increased activity will improve Outcome: Progressing   Problem: Pain Management: Goal: Pain level will decrease with appropriate interventions Outcome: Progressing   Problem: Education: Goal: Knowledge of General Education information will improve Description: Including pain rating scale, medication(s)/side effects and non-pharmacologic comfort measures Outcome: Progressing   Problem: Health Behavior/Discharge Planning: Goal: Ability to manage health-related needs will improve Outcome: Progressing   Problem: Clinical Measurements: Goal: Ability to maintain clinical measurements within normal limits will improve Outcome: Progressing Goal: Will remain free from infection Outcome: Progressing Goal: Diagnostic test results will improve Outcome: Progressing Goal: Respiratory complications will improve Outcome: Progressing Goal: Cardiovascular complication will be avoided Outcome: Progressing   Problem: Activity: Goal: Risk for activity intolerance will decrease Outcome: Progressing   Problem: Nutrition: Goal: Adequate nutrition will be maintained Outcome: Progressing   Problem: Coping: Goal: Level of anxiety will decrease Outcome: Progressing   Problem: Elimination: Goal: Will not experience complications related to bowel motility Outcome: Progressing Goal: Will not experience complications related to urinary retention Outcome: Progressing   Problem: Pain Managment: Goal: General experience of comfort will improve Outcome: Progressing   Problem: Safety: Goal: Ability to remain free from injury will improve Outcome: Progressing   Problem:  Skin Integrity: Goal: Risk for impaired skin integrity will decrease Outcome: Progressing   Problem: Clinical Measurements: Goal: Ability to avoid or minimize complications of infection will improve Outcome: Progressing   Problem: Skin Integrity: Goal: Skin integrity will improve Outcome: Progressing

## 2023-03-22 NOTE — Plan of Care (Signed)
  Problem: Education: Goal: Knowledge of the prescribed therapeutic regimen will improve Outcome: Progressing   Problem: Pain Management: Goal: Pain level will decrease with appropriate interventions Outcome: Progressing   Problem: Education: Goal: Knowledge of General Education information will improve Description: Including pain rating scale, medication(s)/side effects and non-pharmacologic comfort measures Outcome: Progressing   Problem: Clinical Measurements: Goal: Ability to maintain clinical measurements within normal limits will improve Outcome: Progressing

## 2023-03-23 DIAGNOSIS — I5032 Chronic diastolic (congestive) heart failure: Secondary | ICD-10-CM | POA: Diagnosis not present

## 2023-03-23 DIAGNOSIS — F418 Other specified anxiety disorders: Secondary | ICD-10-CM | POA: Diagnosis not present

## 2023-03-23 DIAGNOSIS — L03113 Cellulitis of right upper limb: Secondary | ICD-10-CM | POA: Diagnosis not present

## 2023-03-23 DIAGNOSIS — J449 Chronic obstructive pulmonary disease, unspecified: Secondary | ICD-10-CM | POA: Diagnosis not present

## 2023-03-23 LAB — COMPREHENSIVE METABOLIC PANEL
ALT: 38 U/L (ref 0–44)
AST: 41 U/L (ref 15–41)
Albumin: 3.1 g/dL — ABNORMAL LOW (ref 3.5–5.0)
Alkaline Phosphatase: 69 U/L (ref 38–126)
Anion gap: 12 (ref 5–15)
BUN: 12 mg/dL (ref 8–23)
CO2: 25 mmol/L (ref 22–32)
Calcium: 9.1 mg/dL (ref 8.9–10.3)
Chloride: 102 mmol/L (ref 98–111)
Creatinine, Ser: 0.58 mg/dL — ABNORMAL LOW (ref 0.61–1.24)
GFR, Estimated: >60 mL/min (ref >60)
Glucose, Bld: 102 mg/dL — ABNORMAL HIGH (ref 70–99)
Potassium: 4.1 mmol/L (ref 3.5–5.1)
Sodium: 139 mmol/L (ref 135–145)
Total Bilirubin: 1.1 mg/dL (ref 0.3–1.2)
Total Protein: 6.1 g/dL — ABNORMAL LOW (ref 6.5–8.1)

## 2023-03-23 LAB — CBC WITH DIFFERENTIAL/PLATELET
Abs Immature Granulocytes: 0.08 10*3/uL — ABNORMAL HIGH (ref 0.00–0.07)
Basophils Absolute: 0 10*3/uL (ref 0.0–0.1)
Basophils Relative: 1 %
Eosinophils Absolute: 0.1 10*3/uL (ref 0.0–0.5)
Eosinophils Relative: 2 %
HCT: 40.6 % (ref 39.0–52.0)
Hemoglobin: 13.7 g/dL (ref 13.0–17.0)
Immature Granulocytes: 2 %
Lymphocytes Relative: 16 %
Lymphs Abs: 0.9 10*3/uL (ref 0.7–4.0)
MCH: 32.9 pg (ref 26.0–34.0)
MCHC: 33.7 g/dL (ref 30.0–36.0)
MCV: 97.4 fL (ref 80.0–100.0)
Monocytes Absolute: 0.9 10*3/uL (ref 0.1–1.0)
Monocytes Relative: 17 %
Neutro Abs: 3.4 10*3/uL (ref 1.7–7.7)
Neutrophils Relative %: 62 %
Platelets: 86 10*3/uL — ABNORMAL LOW (ref 150–400)
RBC: 4.17 MIL/uL — ABNORMAL LOW (ref 4.22–5.81)
RDW: 13.8 % (ref 11.5–15.5)
WBC: 5.4 10*3/uL (ref 4.0–10.5)
nRBC: 0 % (ref 0.0–0.2)

## 2023-03-23 LAB — IRON AND TIBC
Iron: 96 ug/dL (ref 45–182)
Saturation Ratios: 42 % — ABNORMAL HIGH (ref 17.9–39.5)
TIBC: 227 ug/dL — ABNORMAL LOW (ref 250–450)
UIBC: 131 ug/dL

## 2023-03-23 LAB — PHOSPHORUS: Phosphorus: 3.2 mg/dL (ref 2.5–4.6)

## 2023-03-23 LAB — RETICULOCYTES
Immature Retic Fract: 16.1 % — ABNORMAL HIGH (ref 2.3–15.9)
RBC.: 4.09 MIL/uL — ABNORMAL LOW (ref 4.22–5.81)
Retic Count, Absolute: 39.3 10*3/uL (ref 19.0–186.0)
Retic Ct Pct: 1 % (ref 0.4–3.1)

## 2023-03-23 LAB — FOLATE: Folate: 7.3 ng/mL (ref >5.9)

## 2023-03-23 LAB — VITAMIN B12: Vitamin B-12: 599 pg/mL (ref 180–914)

## 2023-03-23 LAB — FERRITIN: Ferritin: 219 ng/mL (ref 24–336)

## 2023-03-23 LAB — MAGNESIUM: Magnesium: 2.3 mg/dL (ref 1.7–2.4)

## 2023-03-23 MED ORDER — DOXYCYCLINE HYCLATE 100 MG PO TABS
100.0000 mg | ORAL_TABLET | Freq: Two times a day (BID) | ORAL | Status: DC
  Administered 2023-03-23 – 2023-03-24 (×2): 100 mg via ORAL
  Filled 2023-03-23 (×2): qty 1

## 2023-03-23 MED ORDER — LINEZOLID 600 MG PO TABS: 600.0000 mg | ORAL_TABLET | Freq: Two times a day (BID) | ORAL | Status: DC

## 2023-03-23 MED ORDER — AMOXICILLIN-POT CLAVULANATE 875-125 MG PO TABS: 1.0000 | ORAL_TABLET | Freq: Two times a day (BID) | ORAL | Status: DC

## 2023-03-23 MED ORDER — CIPROFLOXACIN HCL 500 MG PO TABS
500.0000 mg | ORAL_TABLET | Freq: Two times a day (BID) | ORAL | Status: DC
  Administered 2023-03-23 – 2023-03-24 (×2): 500 mg via ORAL
  Filled 2023-03-23 (×2): qty 1

## 2023-03-23 NOTE — Progress Notes (Signed)
Mobility Specialist - Progress Note   03/23/23 1145  Mobility  Activity Ambulated with assistance in hallway  Level of Assistance Modified independent, requires aide device or extra time  Assistive Device Other (Comment) (IV Pole)  Distance Ambulated (ft) 250 ft  Activity Response Tolerated well  Mobility Referral Yes  $Mobility charge 1 Mobility  Mobility Specialist Start Time (ACUTE ONLY) 1122  Mobility Specialist Stop Time (ACUTE ONLY) 1141  Mobility Specialist Time Calculation (min) (ACUTE ONLY) 19 min   Pt received in bed and agreeable to mobility. No complaints during session. Pt to bed after session with all needs met.    [facility]

## 2023-03-23 NOTE — Plan of Care (Signed)
  Problem: Education: Goal: Knowledge of the prescribed therapeutic regimen will improve Outcome: Progressing Goal: Understanding of activity limitations/precautions following surgery will improve Outcome: Progressing Goal: Individualized Educational Video(s) Outcome: Progressing   Problem: Activity: Goal: Ability to tolerate increased activity will improve Outcome: Progressing   Problem: Pain Management: Goal: Pain level will decrease with appropriate interventions Outcome: Progressing   Problem: Education: Goal: Knowledge of General Education information will improve Description: Including pain rating scale, medication(s)/side effects and non-pharmacologic comfort measures Outcome: Progressing   Problem: Health Behavior/Discharge Planning: Goal: Ability to manage health-related needs will improve Outcome: Progressing   Problem: Clinical Measurements: Goal: Ability to maintain clinical measurements within normal limits will improve Outcome: Progressing Goal: Will remain free from infection Outcome: Progressing Goal: Diagnostic test results will improve Outcome: Progressing Goal: Respiratory complications will improve Outcome: Progressing Goal: Cardiovascular complication will be avoided Outcome: Progressing   Problem: Activity: Goal: Risk for activity intolerance will decrease Outcome: Progressing   Problem: Nutrition: Goal: Adequate nutrition will be maintained Outcome: Progressing   Problem: Coping: Goal: Level of anxiety will decrease Outcome: Progressing   Problem: Elimination: Goal: Will not experience complications related to bowel motility Outcome: Progressing Goal: Will not experience complications related to urinary retention Outcome: Progressing   Problem: Pain Managment: Goal: General experience of comfort will improve Outcome: Progressing   Problem: Safety: Goal: Ability to remain free from injury will improve Outcome: Progressing   Problem:  Skin Integrity: Goal: Risk for impaired skin integrity will decrease Outcome: Progressing   Problem: Clinical Measurements: Goal: Ability to avoid or minimize complications of infection will improve Outcome: Progressing   Problem: Skin Integrity: Goal: Skin integrity will improve Outcome: Progressing

## 2023-03-23 NOTE — Progress Notes (Signed)
PROGRESS NOTE    [name]  YQM:[id number] DOB: 04-27-1956 DOA: 03/19/2023 PCP: [name], MD   Brief Narrative:  Patient is a 67 year old overweight Caucasian male with a past medical history significant for but not limited to alcoholism in remission, hepatitis C status posttreatment with Harvoni, cirrhosis, hypertension, seizure disorder, depression and anxiety as well as COPD who presented with right forearm pain and swelling but mainly it was in his hand.  He reports that he stopped 2 to 3 days ago and suffered some minor lacerations near the wrist and subsequently since then he developed progressive pain and swelling as well as redness to to the hand and wrist.  The pain became so severe that he presented to the ED for further evaluation.  X-Ray of the hand demonstrated tissue swelling of the right hand with subcutaneous gas about the radial aspect of the proximal hand with questionable tiny foreign body.  Blood cultures were collected in ED and he was placed on IV antibiotics of vancomycin and cefepime.  We have ordered an MRI for further evaluation and asked hand surgery consult.  MRI as below and hand surgery feels that there is no definite area to surgically drained and recommending broad-spectrum antibiotics and blood sugar control as well as elevation and cleaning of the hand.  Case was discussed with infectious diseases and will change to oral antibiotics today and monitor and anticipate discharge in next 24 hours  Assessment and Plan:  RUE and Hand Cellulitis, slowly improving  -Continue treatment with vancomycin and cefepime, follow cultures and clinical course,  -MRI done and showed "There is metallic susceptibility artifact within the soft tissues lateral and slightly volar to the distal scaphoid, corresponding to the punctate 1 mm metallic density seen on radiographs. On recent radiographs there are also foci of air within the soft tissues in this region. There appears to  be thinning of the overlying skin, suggesting a laceration from recent trauma. Recommend clinical correlation. There is a mild-to-moderate radiocarpal joint effusion extending dorsally. There is moderate dorsal hand soft tissue swelling with more mild edema seen throughout the entire hand and fingers, compatible with cellulitis. There appear to be small erosions at the medial aspect of the triquetrum and the medial and lateral aspects of the second through fifth metacarpal  heads. There is no associated marrow edema, and, if real, these erosions of acute osteomyelitis. This may be secondary to an inflammatory arthropathy. No definite evidence of acute osteomyelitis. There is mild-to-moderate second dorsal extensor compartment tenosynovitis." -Hand Surgery Dr. Izora Ribas consulted and feels there is no definite area to surgically drain and recommending continuing broad-spectrum antibiotics, blood sugar control as well as elevating the hand and washing the abrasion with soap and water and applying antibiotic ointment -Hand is improving discussed the case with infectious diseases who recommends changing the patient to linezolid and ciprofloxacin for 2 weeks.  Will watch overnight and anticipate discharge in the morning -Infectious diseases has set up a follow-up appointment for the patient on 04/02/2023   COPD  -Not in exacerbation  -Continue Breo Ellipta and Incruse Ellipta 1 puff IH Daily   Cirrhosis  -Appears compensated   -Continue Lactulose 20 grams po BID -LFTs wnl   Hypertension  -Continue Lisinopril 5 mg po Daily and Metoprolol Succinate 12.5 mg po Daily -Continue to Monitor BP per Protocol -Last BP reading was 112/62   Seizures  -Continue Divalproex 500 mg po TID and Oxcarbazepine 600 mg po qHS -Seizure Precautions  Depression, Anxiety  -Continue Buspirone 15 mg po TID, Oxcarbazepine 600 mg po Daily, Divalproex 500 mg po TID, Sertraline 200 mg po Daily, Quetiapine 200 mg po at bedtime and  Trazodone 200 mg po at bedtime prn Sleep  Normocytic Anemia -Hgb/Hct Trend: Recent Labs  Lab 03/19/23 2050 03/20/23 0906 03/21/23 0546 03/22/23 0537 03/23/23 0512  HGB 14.0 13.5 12.5* 12.6* 13.7  HCT 40.4 40.3 37.4* 36.7* 40.6  MCV 96.2 98.5 97.9 96.3 97.4  -Checked Anemia Panel and showed an iron level of 96, UIBC of 131, TIBC of 227, saturation was of 42%, ferritin level of 219, folate level 7.3, and vitamin B12 599 -Continue to Monitor for S/Sx of Bleeding; No overt bleeding noted -Repeat CBC in the AM  Hypokalemia -Patient's K+ Level Trend: Recent Labs  Lab 03/19/23 2050 03/20/23 0906 03/21/23 0546 03/22/23 0537 03/23/23 0512  K 3.4* 3.8 3.4* 3.6 4.1  -Replete with po KCL 40 mEQ x1 -Continue to Monitor and Replete as Necessary -Repeat CMP in the AM   Thrombocytopenia  -Appears chronic and stable and improving -Likely related to liver cirrhosis, splenomegaly  Recent Labs  Lab 03/19/23 2050 03/20/23 0906 03/21/23 0546 03/22/23 0537 03/23/23 0512  PLT 59* 51* 56* 63* 86*  -Continue to Monitor and Trend and Repeat CBC in the AM  Hypoalbuminemia -Patient's Albumin Trend: Recent Labs  Lab 03/19/23 2050 03/20/23 0939 03/22/23 0537 03/23/23 0512  ALBUMIN 4.1 3.5 3.1* 3.1*  -Continue to Monitor and Trend and repeat CMP in the AM   DVT prophylaxis: SCDs Start: 03/20/23 0021    Code Status: Full Code Family Communication: No family currently at bedside  Disposition Plan:  Level of care: Med-Surg Status is: Inpatient Remains inpatient appropriate because: Needs further clinical improvement and anticipating discharge in next 24 to 48 hours   Consultants:  Discussed the case with infectious diseases Dr. Thedore Mins Hand surgery Dr. Izora Ribas  Procedures:  As delineated as above  Antimicrobials:  Anti-infectives (From admission, onward)    Start     Dose/Rate Route Frequency Ordered Stop   03/23/23 2200  linezolid (ZYVOX) tablet 600 mg        600 mg Oral Every  12 hours 03/23/23 1314     03/23/23 2000  ciprofloxacin (CIPRO) tablet 500 mg        500 mg Oral 2 times daily 03/23/23 1325     03/23/23 1400  amoxicillin-clavulanate (AUGMENTIN) 875-125 MG per tablet 1 tablet  Status:  Discontinued        1 tablet Oral Every 12 hours 03/23/23 1314 03/23/23 1324   03/20/23 1200  vancomycin (VANCOCIN) IVPB 1000 mg/200 mL premix  Status:  Discontinued        1,000 mg 200 mL/hr over 60 Minutes Intravenous Every 12 hours 03/20/23 0106 03/23/23 1327   03/20/23 0000  ceFEPIme (MAXIPIME) 2 g in sodium chloride 0.9 % 100 mL IVPB  Status:  Discontinued        2 g 200 mL/hr over 30 Minutes Intravenous Every 8 hours 03/19/23 2353 03/23/23 1327   03/20/23 0000  vancomycin (VANCOREADY) IVPB 1500 mg/300 mL        1,500 mg 150 mL/hr over 120 Minutes Intravenous  Once 03/19/23 2353 03/20/23 0543   03/20/23 0000  piperacillin-tazobactam (ZOSYN) IVPB 3.375 g  Status:  Discontinued        3.375 g 100 mL/hr over 30 Minutes Intravenous Every 8 hours 03/19/23 2353 03/20/23 0021       Subjective: Seen and  examined at bedside and his hand is doing better but still swollen.  Erythema is improving.  Hand is continuing to spontaneously weep.  Still having pain but not as much and has no other concerns or complaints at this time.  Objective: Vitals:   03/23/23 0450 03/23/23 0818 03/23/23 0820 03/23/23 1259  BP: 114/67   112/62  Pulse: 60   (!) 55  Resp:    15  Temp: 98.3 F (36.8 C)   99 F (37.2 C)  TempSrc: Oral   Oral  SpO2: 93% 94% 94% 94%  Weight:      Height:        Intake/Output Summary (Last 24 hours) at 03/23/2023 1335 Last data filed at 03/23/2023 0500 Gross per 24 hour  Intake 540 ml  Output 1800 ml  Net -1260 ml   Filed Weights   03/19/23 2035 03/20/23 0840  Weight: 74.6 kg 76 kg   Examination: Physical Exam:  Constitutional: WN/WD chronically ill-appearing and disheveled Caucasian male in no acute distress and hand is slowly improving and is able  to make somewhat of a fist today Respiratory: Diminished to auscultation bilaterally, no wheezing, rales, rhonchi or crackles. Normal respiratory effort and patient is not tachypenic. No accessory muscle use.  Unlabored breathing Cardiovascular: RRR, no murmurs / rubs / gallops. S1 and S2 auscultated. No extremity edema.  Abdomen: Soft, non-tender, mildly-distended. Bowel sounds positive.  GU: Deferred. Musculoskeletal: No clubbing / cyanosis of digits/nails. No joint deformity upper and lower extremities. Skin: Has tattoos diffusely scattered throughout his body and his right hand is slowly improving and the erythema is resolving and and is not as warm or painful to touch Neurologic: CN 2-12 grossly intact with no focal deficits. Romberg sign and cerebellar reflexes not assessed.  Psychiatric: Normal judgment and insight. Alert and oriented x 3. Normal mood and appropriate affect.   Data Reviewed: I have personally reviewed following labs and imaging studies  CBC: Recent Labs  Lab 03/19/23 2050 03/20/23 0906 03/21/23 0546 03/22/23 0537 03/23/23 0512  WBC 11.4* 6.4 4.7 4.9 5.4  NEUTROABS 9.8* 5.0  --  3.3 3.4  HGB 14.0 13.5 12.5* 12.6* 13.7  HCT 40.4 40.3 37.4* 36.7* 40.6  MCV 96.2 98.5 97.9 96.3 97.4  PLT 59* 51* 56* 63* 86*   Basic Metabolic Panel: Recent Labs  Lab 03/19/23 2050 03/20/23 0906 03/20/23 0939 03/21/23 0546 03/22/23 0537 03/23/23 0512  NA 138 139  --  136 137 139  K 3.4* 3.8  --  3.4* 3.6 4.1  CL 103 106  --  101 102 102  CO2 24 25  --  24 25 25   GLUCOSE 174* 86  --  89 93 102*  BUN 18 16  --  13 10 12   CREATININE 0.80 0.57*  --  0.56* 0.57* 0.58*  CALCIUM 9.1 8.6*  --  8.5* 8.8* 9.1  MG  --  2.0  --   --  2.1 2.3  PHOS  --   --  2.9  --  3.4 3.2   GFR: Estimated Creatinine Clearance: 85.5 mL/min (A) (by C-G formula based on SCr of 0.58 mg/dL (L)). Liver Function Tests: Recent Labs  Lab 03/19/23 2050 03/20/23 0939 03/22/23 0537 03/23/23 0512  AST  30 24 32 41  ALT 24 20 31  38  ALKPHOS 48 42 62 69  BILITOT 0.8 1.1 1.1 1.1  PROT 7.2 5.8* 5.8* 6.1*  ALBUMIN 4.1 3.5 3.1* 3.1*   No results for input(s): "LIPASE", "  AMYLASE" in the last 168 hours. No results for input(s): "AMMONIA" in the last 168 hours. Coagulation Profile: No results for input(s): "INR", "PROTIME" in the last 168 hours. Cardiac Enzymes: No results for input(s): "CKTOTAL", "CKMB", "CKMBINDEX", "TROPONINI" in the last 168 hours. BNP (last 3 results) No results for input(s): "PROBNP" in the last 8760 hours. HbA1C: No results for input(s): "HGBA1C" in the last 72 hours. CBG: No results for input(s): "GLUCAP" in the last 168 hours. Lipid Profile: No results for input(s): "CHOL", "HDL", "LDLCALC", "TRIG", "CHOLHDL", "LDLDIRECT" in the last 72 hours. Thyroid Function Tests: No results for input(s): "TSH", "T4TOTAL", "FREET4", "T3FREE", "THYROIDAB" in the last 72 hours. Anemia Panel: Recent Labs    03/23/23 0512  VITAMINB12 599  FOLATE 7.3  FERRITIN 219  TIBC 227*  IRON 96  RETICCTPCT 1.0   Sepsis Labs: Recent Labs  Lab 03/19/23 2050  LATICACIDVEN 1.6   Recent Results (from the past 240 hour(s))  Blood culture (routine x 2)     Status: None (Preliminary result)   Collection Time: 03/19/23  8:50 PM   Specimen: BLOOD  Result Value Ref Range Status   Specimen Description   Final    BLOOD SITE NOT SPECIFIED Performed at [facility], [address] [place], [place] [address]    Special Requests   Final    BOTTLES DRAWN AEROBIC AND ANAEROBIC Blood Culture adequate volume Performed at [facility], [address], [place], [place] [address]    Culture   Final    NO GROWTH 4 DAYS Performed at [facility], [address] [place], [place] [address]    Report Status PENDING  Incomplete  Blood culture (routine x 2)     Status: None (Preliminary result)   Collection Time: 03/19/23  9:19 PM   Specimen: BLOOD   Result Value Ref Range Status   Specimen Description   Final    BLOOD RIGHT ANTECUBITAL Performed at [facility], [address], [place], [place] [address]    Special Requests   Final    BOTTLES DRAWN AEROBIC AND ANAEROBIC Blood Culture adequate volume Performed at [facility], [address] [place], [place] [address]    Culture   Final    NO GROWTH 3 DAYS Performed at [facility], [address], [place], [place] [address]    Report Status PENDING  Incomplete    Radiology Studies: No results found.  Scheduled Meds:  atorvastatin  20 mg Oral QHS   busPIRone  15 mg Oral TID   ciprofloxacin  500 mg Oral BID   divalproex  500 mg Oral TID   fluticasone furoate-vilanterol  1 puff Inhalation Daily   And   umeclidinium bromide  1 puff Inhalation Daily   lactulose  20 g Oral BID   linezolid  600 mg Oral Q12H   lisinopril  5 mg Oral Daily   metoprolol succinate  12.5 mg Oral Daily   oxcarbazepine  600 mg Oral QPM   prazosin  1 mg Oral q morning   QUEtiapine  200 mg Oral QHS   sertraline  200 mg Oral Daily   Continuous Infusions:   LOS: 3 days   Marguerita Merles, DO [facility] Available via Epic secure chat 7am-7pm After these hours, please refer to coverage provider listed on [email or web address] 03/23/2023, 1:35 PM

## 2023-03-23 NOTE — Progress Notes (Signed)
   03/23/23 0918  TOC Brief Assessment  Insurance and Status Reviewed  Patient has primary care physician Yes  Home environment has been reviewed home  Prior level of function: independent  Prior/Current Home Services No current home services  Social Determinants of Health Reivew SDOH reviewed no interventions necessary  Readmission risk has been reviewed Yes  Transition of care needs no transition of care needs at this time

## 2023-03-23 NOTE — Progress Notes (Signed)
Pharmacy Antibiotic Note  [name] is a 67 y.o. male admitted on 03/19/2023 with  RUE and right hand cellulitis  s/p fall into creek. Evaluated by hand surgeon > no plan for surgical intervention at this time.   Pharmacy has been consulted for vancomycin and cefepime dosing.  Today, 03/23/23 WBC WNL, afebrile SCr stable, WNL  Today is day #4 broad spectrum IV antibiotics.   Plan: Continue cefepime 2 g IV q8h Continue vancomycin 1000 mt IV q12h for estimated AUC of 485. Goal 400-550.  Monitor renal function. Check vanc levels as needed.   Height: 5\' 8"  (172.7 cm) Weight: 76 kg (167 lb 8 oz) IBW/kg (Calculated) : 68.4  Temp (24hrs), Avg:98.1 F (36.7 C), Min:98 F (36.7 C), Max:98.3 F (36.8 C)  Recent Labs  Lab 03/19/23 2050 03/20/23 0906 03/21/23 0546 03/22/23 0537 03/23/23 0512  WBC 11.4* 6.4 4.7 4.9 5.4  CREATININE 0.80 0.57* 0.56* 0.57* 0.58*  LATICACIDVEN 1.6  --   --   --   --     Estimated Creatinine Clearance: 85.5 mL/min (A) (by C-G formula based on SCr of 0.58 mg/dL (L)).    No Known Allergies  Antimicrobials this admission: cefepime 10/4 >>  vancomycin 10/4 >>   Dose adjustments this admission:  Microbiology results: 10/3 BCx: ngtd  Cindi Carbon, PharmD 03/23/2023 7:41 AM

## 2023-03-24 ENCOUNTER — Other Ambulatory Visit: Payer: Self-pay

## 2023-03-24 ENCOUNTER — Other Ambulatory Visit (HOSPITAL_COMMUNITY): Payer: Self-pay

## 2023-03-24 DIAGNOSIS — J449 Chronic obstructive pulmonary disease, unspecified: Secondary | ICD-10-CM | POA: Diagnosis not present

## 2023-03-24 DIAGNOSIS — I5032 Chronic diastolic (congestive) heart failure: Secondary | ICD-10-CM | POA: Diagnosis not present

## 2023-03-24 DIAGNOSIS — M7989 Other specified soft tissue disorders: Secondary | ICD-10-CM

## 2023-03-24 DIAGNOSIS — L03113 Cellulitis of right upper limb: Secondary | ICD-10-CM | POA: Diagnosis not present

## 2023-03-24 DIAGNOSIS — Z87898 Personal history of other specified conditions: Secondary | ICD-10-CM | POA: Diagnosis not present

## 2023-03-24 LAB — CBC WITH DIFFERENTIAL/PLATELET
Abs Immature Granulocytes: 0.07 10*3/uL (ref 0.00–0.07)
Basophils Absolute: 0.1 10*3/uL (ref 0.0–0.1)
Basophils Relative: 1 %
Eosinophils Absolute: 0.1 10*3/uL (ref 0.0–0.5)
Eosinophils Relative: 2 %
HCT: 38.7 % — ABNORMAL LOW (ref 39.0–52.0)
Hemoglobin: 13 g/dL (ref 13.0–17.0)
Immature Granulocytes: 1 %
Lymphocytes Relative: 18 %
Lymphs Abs: 0.9 10*3/uL (ref 0.7–4.0)
MCH: 32.7 pg (ref 26.0–34.0)
MCHC: 33.6 g/dL (ref 30.0–36.0)
MCV: 97.2 fL (ref 80.0–100.0)
Monocytes Absolute: 0.9 10*3/uL (ref 0.1–1.0)
Monocytes Relative: 17 %
Neutro Abs: 3.1 10*3/uL (ref 1.7–7.7)
Neutrophils Relative %: 61 %
Platelets: 85 10*3/uL — ABNORMAL LOW (ref 150–400)
RBC: 3.98 MIL/uL — ABNORMAL LOW (ref 4.22–5.81)
RDW: 13.7 % (ref 11.5–15.5)
WBC: 5.2 10*3/uL (ref 4.0–10.5)
nRBC: 0 % (ref 0.0–0.2)

## 2023-03-24 LAB — COMPREHENSIVE METABOLIC PANEL
ALT: 33 U/L (ref 0–44)
AST: 33 U/L (ref 15–41)
Albumin: 3 g/dL — ABNORMAL LOW (ref 3.5–5.0)
Alkaline Phosphatase: 66 U/L (ref 38–126)
Anion gap: 13 (ref 5–15)
BUN: 17 mg/dL (ref 8–23)
CO2: 25 mmol/L (ref 22–32)
Calcium: 9.1 mg/dL (ref 8.9–10.3)
Chloride: 101 mmol/L (ref 98–111)
Creatinine, Ser: 0.42 mg/dL — ABNORMAL LOW (ref 0.61–1.24)
GFR, Estimated: >60 mL/min (ref >60)
Glucose, Bld: 101 mg/dL — ABNORMAL HIGH (ref 70–99)
Potassium: 3.7 mmol/L (ref 3.5–5.1)
Sodium: 139 mmol/L (ref 135–145)
Total Bilirubin: 0.9 mg/dL (ref 0.3–1.2)
Total Protein: 5.7 g/dL — ABNORMAL LOW (ref 6.5–8.1)

## 2023-03-24 LAB — MAGNESIUM: Magnesium: 2.1 mg/dL (ref 1.7–2.4)

## 2023-03-24 LAB — CULTURE, BLOOD (ROUTINE X 2)
Culture: NO GROWTH
Special Requests: ADEQUATE

## 2023-03-24 LAB — PHOSPHORUS: Phosphorus: 4 mg/dL (ref 2.5–4.6)

## 2023-03-24 MED ORDER — DOXYCYCLINE HYCLATE 100 MG PO TABS
100.0000 mg | ORAL_TABLET | Freq: Two times a day (BID) | ORAL | 0 refills | Status: AC
  Filled 2023-03-24: qty 28, 14d supply, fill #0

## 2023-03-24 MED ORDER — CIPROFLOXACIN HCL 500 MG PO TABS
500.0000 mg | ORAL_TABLET | Freq: Two times a day (BID) | ORAL | 0 refills | Status: AC
  Filled 2023-03-24: qty 28, 14d supply, fill #0

## 2023-03-24 MED ORDER — ACETAMINOPHEN 325 MG PO TABS
650.0000 mg | ORAL_TABLET | Freq: Four times a day (QID) | ORAL | 0 refills | Status: DC | PRN
  Filled 2023-03-24: qty 20, 3d supply, fill #0

## 2023-03-24 NOTE — Discharge Summary (Signed)
Physician Discharge Summary   Patient: [name] MRN: [id number] DOB: January 17, 1956  Admit date:     03/19/2023  Discharge date: 03/24/23  Discharge Physician: Marguerita Merles, DO   PCP: [name], MD   Recommendations at discharge:   Follow-up with PCP within 1 to 2 weeks and repeat CBC, CMP, mag, Phos within 1 week Follow-up with hand surgery Dr. Izora Ribas in outpatient setting Follow-up with infectious diseases in outpatient setting  Discharge Diagnoses: Principal Problem:   Cellulitis of right upper extremity Active Problems:   Liver cirrhosis (HCC)   COPD (chronic obstructive pulmonary disease) (HCC)   Thrombocytopenia (HCC)   History of seizures   Chronic diastolic CHF (congestive heart failure) (HCC)   Depression with anxiety  Resolved Problems:   * No resolved hospital problems. *  Hospital Course: Patient is a 67 year old overweight Caucasian male with a past medical history significant for but not limited to alcoholism in remission, hepatitis C status posttreatment with Harvoni, cirrhosis, hypertension, seizure disorder, depression and anxiety as well as COPD who presented with right forearm pain and swelling but mainly it was in his hand.  He reports that he stopped 2 to 3 days ago and suffered some minor lacerations near the wrist and subsequently since then he developed progressive pain and swelling as well as redness to to the hand and wrist.  The pain became so severe that he presented to the ED for further evaluation.  X-Ray of the hand demonstrated tissue swelling of the right hand with subcutaneous gas about the radial aspect of the proximal hand with questionable tiny foreign body.  Blood cultures were collected in ED and he was placed on IV antibiotics of vancomycin and cefepime.  We have ordered an MRI for further evaluation and asked hand surgery consult.  MRI as below and hand surgery feels that there is no definite area to surgically drained and recommending  broad-spectrum antibiotics and blood sugar control as well as elevation and cleaning of the hand.  Case was discussed with infectious diseases and and they recommended changing to oral ciprofloxacin and oral doxycycline and he was monitored overnight he did well.  He is much improved and he is stable for discharge at this time going to follow-up with PCP, hand surgery as well as infectious disease in outpatient setting.  Assessment and Plan:  RUE and Hand Cellulitis, slowly improving  -Continue treatment with vancomycin and cefepime, follow cultures and clinical course,  -MRI done and showed "There is metallic susceptibility artifact within the soft tissues lateral and slightly volar to the distal scaphoid, corresponding to the punctate 1 mm metallic density seen on radiographs. On recent radiographs there are also foci of air within the soft tissues in this region. There appears to be thinning of the overlying skin, suggesting a laceration from recent trauma. Recommend clinical correlation. There is a mild-to-moderate radiocarpal joint effusion extending dorsally. There is moderate dorsal hand soft tissue swelling with more mild edema seen throughout the entire hand and fingers, compatible with cellulitis. There appear to be small erosions at the medial aspect of the triquetrum and the medial and lateral aspects of the second through fifth metacarpal  heads. There is no associated marrow edema, and, if real, these erosions of acute osteomyelitis. This may be secondary to an inflammatory arthropathy. No definite evidence of acute osteomyelitis. There is mild-to-moderate second dorsal extensor compartment tenosynovitis." -Hand Surgery Dr. Izora Ribas consulted and feels there is no definite area to surgically drain and recommending  continuing broad-spectrum antibiotics, blood sugar control as well as elevating the hand and washing the abrasion with soap and water and applying antibiotic ointment -Hand is improving  discussed the case with infectious diseases who recommends changing the patient to oral ciprofloxacin as well as oral doxycycline for 2 weeks weeks.  He is improved and stable for discharge only to follow-up with PCP and hand surgery -Infectious diseases has set up a follow-up appointment for the patient on 04/02/2023   COPD  -Not in exacerbation  -Continue Breo Ellipta and Incruse Ellipta 1 puff IH Daily   Cirrhosis  -Appears compensated   -Continue Lactulose 20 grams po BID -LFTs wnl   Hypertension  -Continue Lisinopril 5 mg po Daily and Metoprolol Succinate 12.5 mg po Daily -Continue to Monitor BP per Protocol -Last BP reading was 120/59   Seizures  -Continue Divalproex 500 mg po TID and Oxcarbazepine 600 mg po qHS -Seizure Precautions   Depression, Anxiety  -Continue Buspirone 15 mg po TID, Oxcarbazepine 600 mg po Daily, Divalproex 500 mg po TID, Sertraline 200 mg po Daily, Quetiapine 200 mg po at bedtime and Trazodone 200 mg po at bedtime prn Sleep  Normocytic Anemia -Hgb/Hct Trend: Recent Labs  Lab 03/19/23 2050 03/20/23 0906 03/21/23 0546 03/22/23 0537 03/23/23 0512 03/24/23 0521  HGB 14.0 13.5 12.5* 12.6* 13.7 13.0  HCT 40.4 40.3 37.4* 36.7* 40.6 38.7*  MCV 96.2 98.5 97.9 96.3 97.4 97.2  -Checked Anemia Panel and showed an iron level of 96, UIBC of 131, TIBC of 227, saturation was of 42%, ferritin level of 219, folate level 7.3, and vitamin B12 599 -Continue to Monitor for S/Sx of Bleeding; No overt bleeding noted -Repeat CBC within 1 week  Hypokalemia -Patient's K+ Level Trend: Recent Labs  Lab 03/19/23 2050 03/20/23 0906 03/21/23 0546 03/22/23 0537 03/23/23 0512 03/24/23 0521  K 3.4* 3.8 3.4* 3.6 4.1 3.7  -Replete with po KCL 40 mEQ x1 -Continue to Monitor and Replete as Necessary -Repeat CMP within 1 week  Thrombocytopenia  -Appears chronic and stable and improving -Likely related to liver cirrhosis, splenomegaly  Recent Labs  Lab 03/19/23 2050  03/20/23 0906 03/21/23 0546 03/22/23 0537 03/23/23 0512 03/24/23 0521  PLT 59* 51* 56* 63* 86* 85*  -Continue to Monitor and Trend and Repeat CBC within 1 week  Hypoalbuminemia -Patient's Albumin Trend: Recent Labs  Lab 03/19/23 2050 03/20/23 0939 03/22/23 0537 03/23/23 0512 03/24/23 0521  ALBUMIN 4.1 3.5 3.1* 3.1* 3.0*  -Continue to Monitor and Trend and repeat CMP within 1 week  Consultants: Hand surgery, discussed the case with infectious diseases Procedures performed: As delineated as above Disposition: Home Diet recommendation:  Cardiac diet DISCHARGE MEDICATION: Allergies as of 03/24/2023   No Known Allergies      Medication List     TAKE these medications    acetaminophen 325 MG tablet Commonly known as: TYLENOL Take 2 tablets (650 mg total) by mouth every 6 (six) hours as needed for mild pain (or Fever >/= 101).   albuterol (2.5 MG/3ML) 0.083% nebulizer solution Commonly known as: PROVENTIL Take 3 mLs (2.5 mg total) by nebulization every 6 (six) hours as needed for wheezing or shortness of breath.   albuterol 108 (90 Base) MCG/ACT inhaler Commonly known as: VENTOLIN HFA Inhale 2 puffs into the lungs every 6 (six) hours as needed for wheezing or shortness of breath.   atorvastatin 20 MG tablet Commonly known as: LIPITOR TAKE 1 TABLET BY MOUTH AT BEDTIME.   Belsomra 15 MG  Tabs Generic drug: Suvorexant Take 1 tablet by mouth at bedtime as needed. What changed: when to take this   busPIRone 15 MG tablet Commonly known as: BUSPAR Take 15 mg by mouth 3 (three) times daily.   ciprofloxacin 500 MG tablet Commonly known as: CIPRO Take 1 tablet (500 mg total) by mouth 2 (two) times daily for 14 days.   divalproex 500 MG DR tablet Commonly known as: DEPAKOTE Take 1 tablet (500 mg total) by mouth 3 (three) times daily.   doxycycline 100 MG tablet Commonly known as: VIBRA-TABS Take 1 tablet (100 mg total) by mouth every 12 (twelve) hours for 14  days.   lactulose 10 GM/15ML solution Commonly known as: CHRONULAC Take 30 mLs (20 g total) by mouth 2 (two) times daily. Hold after 2 loose BM every day   lisinopril 5 MG tablet Commonly known as: ZESTRIL Take 1 tablet (5 mg total) by mouth daily.   methocarbamol 750 MG tablet Commonly known as: Robaxin-750 Take 1 tablet (750 mg total) by mouth every 8 (eight) hours as needed for muscle spasms.   metoprolol succinate 25 MG 24 hr tablet Commonly known as: TOPROL-XL Take 0.5 tablets (12.5 mg total) by mouth daily.   nitroGLYCERIN 0.4 MG SL tablet Commonly known as: NITROSTAT Place 1 tablet (0.4 mg total) under the tongue every 5 (five) minutes as needed for chest pain.   ondansetron 4 MG tablet Commonly known as: Zofran Take 1 tablet (4 mg total) by mouth every 8 (eight) hours as needed for vomiting or nausea.   oxcarbazepine 600 MG tablet Commonly known as: TRILEPTAL Take 600 mg by mouth every evening.   prazosin 1 MG capsule Commonly known as: MINIPRESS Take 1 mg by mouth every morning.   QUEtiapine 200 MG 24 hr tablet Commonly known as: SEROQUEL XR Take 200 mg by mouth at bedtime. Pt states he takes this medication BID   senna-docusate 8.6-50 MG tablet Commonly known as: Senokot-S Take 1 tablet by mouth at bedtime as needed for moderate constipation.   sertraline 100 MG tablet Commonly known as: ZOLOFT Take 200 mg by mouth in the morning.   traZODone 100 MG tablet Commonly known as: DESYREL Take 200 mg by mouth at bedtime as needed for sleep.   Trelegy Ellipta 100-62.5-25 MCG/ACT Aepb Generic drug: Fluticasone-Umeclidin-Vilant Inhale 1 puff into the lungs daily.        Discharge Exam: Filed Weights   03/19/23 2035 03/20/23 0840  Weight: 74.6 kg 76 kg   Vitals:   03/24/23 0428 03/24/23 0751  BP: (!) 120/59   Pulse: (!) 50   Resp:    Temp: 98.3 F (36.8 C)   SpO2: 94% 95%   Examination: Physical Exam:  Constitutional: WN/WD slightly  overweight chronically ill-appearing disheveled Caucasian male in no acute distress Respiratory: Diminished to auscultation bilaterally, no wheezing, rales, rhonchi or crackles. Normal respiratory effort and patient is not tachypenic. No accessory muscle use.  Unlabored breathing Cardiovascular: RRR, no murmurs / rubs / gallops. S1 and S2 auscultated.  Edema is improving significantly Abdomen: Soft, non-tender, very slightly distended. Bowel sounds positive.  GU: Deferred. Musculoskeletal: No clubbing / cyanosis of digits/nails. No joint deformity upper and lower extremities.  Skin: Right hand is swollen and slightly erythematous but is much improved at the time he came in.  It is weeping a little bit.  Able to make a fist and has significant tattoos diffusely tattoo throughout his whole body Neurologic: CN 2-12 grossly intact with no  focal deficits. Romberg sign and cerebellar reflexes not assessed.  Psychiatric: Normal judgment and insight. Alert and oriented x 3. Normal mood and appropriate affect.   Condition at discharge: stable  The results of significant diagnostics from this hospitalization (including imaging, microbiology, ancillary and laboratory) are listed below for reference.   Imaging Studies: MR HAND RIGHT W WO CONTRAST  Result Date: 03/20/2023 CLINICAL DATA:  Larey Seat 4 days ago.  Pain and swelling of right hand. EXAM: MRI OF THE RIGHT HAND WITHOUT AND WITH CONTRAST TECHNIQUE: Multiplanar, multisequence MR imaging of the right hand was performed before and after the administration of intravenous contrast. CONTRAST:  8 mL Gadavist gadolinium IV COMPARISON:  Right hand radiographs 03/19/2023, right wrist radiographs 01/08/2022 FINDINGS: Despite efforts by the technologist and patient, mild motion artifact is present on today's exam and could not be eliminated. This reduces exam sensitivity and specificity. Bones/Joint/Cartilage Mild-to-moderate cervical intercarpal cartilage thinning and  peripheral osteophytosis. Mild triscaphe cartilage thinning and peripheral osteophytosis. Moderate thumb metacarpophalangeal cartilage thinning, joint space narrowing, and peripheral osteophytosis. There appear to be small erosions at the medial aspect of the triquetrum (coronal series 6, image 11) and the medial and lateral aspects of the second through fifth metacarpal heads (coronal series 6 images 14 through 22). There is a mild-to-moderate radiocarpal joint effusion extending dorsally (axial series 5, image 40 and coronal series 7, image 14). Mild-to-moderate fluid within the distal radioulnar joint. Ligaments The metacarpophalangeal collateral ligaments appear intact. Muscles and Tendons The flexor tendons appear intact. There is mild-to-moderate second dorsal extensor compartment tenosynovitis. Soft tissues There is metallic susceptibility artifact within the soft tissues lateral and slightly volar to the distal scaphoid (coronal image 8, axial series 5, image 38, sagittal image 30/44), corresponding to the punctate 1 mm metallic density seen on radiographs. On radiographs there also were multiple locules of subcutaneous gas which likely also cause some blooming artifact lateral to the scaphoid. Within the limitations of metallic artifact, question some irregularity at the skin surface lateral/superficial to the metallic artifact, possibly a soft tissue laceration. Recommend clinical correlation. Mild soft tissue edema and swelling in this region. There is moderate dorsal hand soft tissue swelling greatest at the level of the length of the metacarpals. IMPRESSION: 1. There is metallic susceptibility artifact within the soft tissues lateral and slightly volar to the distal scaphoid, corresponding to the punctate 1 mm metallic density seen on radiographs. On recent radiographs there are also foci of air within the soft tissues in this region. There appears to be thinning of the overlying skin, suggesting a  laceration from recent trauma. Recommend clinical correlation. 2. There is a mild-to-moderate radiocarpal joint effusion extending dorsally. 3. There is moderate dorsal hand soft tissue swelling with more mild edema seen throughout the entire hand and fingers, compatible with cellulitis. 4. There appear to be small erosions at the medial aspect of the triquetrum and the medial and lateral aspects of the second through fifth metacarpal heads. There is no associated marrow edema, and, if real, these erosions of acute osteomyelitis. This may be secondary to an inflammatory arthropathy. No definite evidence of acute osteomyelitis. 5. There is mild-to-moderate second dorsal extensor compartment tenosynovitis. Electronically Signed   By: Neita Garnet M.D.   On: 03/20/2023 22:39   DG Hand Complete Right  Result Date: 03/19/2023 CLINICAL DATA:  Patient fell on concrete 2 days ago and cut hand. Laceration to palmar surface. Entire and is hot and swollen. Swelling extending up to arm to elbow. Unable  to turn arm AP due to pain and swelling. EXAM: RIGHT HAND - COMPLETE 3+ VIEW; RIGHT FOREARM - 2 VIEW COMPARISON:  Wrist radiographs 03/19/2023 at 6:22 p.m. FINDINGS: Redemonstrated subcutaneous gas about the radial aspect of the proximal hand. Tiny associated hyperdensity in this area could represent a foreign body. Diffuse soft tissue swelling about the hand and wrist. No acute fracture. IMPRESSION: 1. Redemonstrated subcutaneous gas about the radial aspect of the proximal hand. Tiny associated hyperdensity in this area could represent a foreign body. Correlate with site of laceration. 2. Diffuse soft tissue swelling about the hand and wrist. 3. No acute fracture. Electronically Signed   By: Minerva Fester M.D.   On: 03/19/2023 22:34   DG Forearm Right  Result Date: 03/19/2023 CLINICAL DATA:  Patient fell on concrete 2 days ago and cut hand. Laceration to palmar surface. Entire and is hot and swollen. Swelling  extending up to arm to elbow. Unable to turn arm AP due to pain and swelling. EXAM: RIGHT HAND - COMPLETE 3+ VIEW; RIGHT FOREARM - 2 VIEW COMPARISON:  Wrist radiographs 03/19/2023 at 6:22 p.m. FINDINGS: Redemonstrated subcutaneous gas about the radial aspect of the proximal hand. Tiny associated hyperdensity in this area could represent a foreign body. Diffuse soft tissue swelling about the hand and wrist. No acute fracture. IMPRESSION: 1. Redemonstrated subcutaneous gas about the radial aspect of the proximal hand. Tiny associated hyperdensity in this area could represent a foreign body. Correlate with site of laceration. 2. Diffuse soft tissue swelling about the hand and wrist. 3. No acute fracture. Electronically Signed   By: Minerva Fester M.D.   On: 03/19/2023 22:34   DG Wrist 2 Views Right  Result Date: 03/19/2023 CLINICAL DATA:  Fall, pain, swelling EXAM: RIGHT WRIST - 2 VIEW COMPARISON:  None Available. FINDINGS: No acute bony abnormality. Specifically, no fracture, subluxation, or dislocation. There appears to be a few locules of gas within the soft tissues overlying the snuffbox. Recommend correlation for laceration in this area. IMPRESSION: No visible acute bony abnormality. Small apparent air locules in the soft tissues overlying the snuffbox. This may be related to laceration. Electronically Signed   By: Charlett Nose M.D.   On: 03/19/2023 19:58   DG Elbow 2 Views Right  Result Date: 03/19/2023 CLINICAL DATA:  Fall EXAM: RIGHT ELBOW - 2 VIEW COMPARISON:  None Available. FINDINGS: There is no evidence of fracture, dislocation, or joint effusion. There is no evidence of arthropathy or other focal bone abnormality. Soft tissues are unremarkable. IMPRESSION: Negative. Electronically Signed   By: Charlett Nose M.D.   On: 03/19/2023 19:57   CT ABDOMEN PELVIS W CONTRAST  Result Date: 03/15/2023 CLINICAL DATA:  Unintentional weight loss. EXAM: CT ABDOMEN AND PELVIS WITH CONTRAST TECHNIQUE:  Multidetector CT imaging of the abdomen and pelvis was performed using the standard protocol following bolus administration of intravenous contrast. RADIATION DOSE REDUCTION: This exam was performed according to the departmental dose-optimization program which includes automated exposure control, adjustment of the mA and/or kV according to patient size and/or use of iterative reconstruction technique. CONTRAST:  75mL OMNIPAQUE IOHEXOL 350 MG/ML SOLN COMPARISON:  CT of the chest abdomen pelvis dated 12/29/2021. FINDINGS: Lower chest: The visualized lung bases are clear. There is coronary vascular calcification. No intra-abdominal free air or free fluid. Hepatobiliary: Fatty liver with morphologic changes of cirrhosis. Multiple gallstones. There is infiltration of the pericholecystic fat versus trace fluid possibly related to underlying liver disease. Ultrasound may provide better evaluation if there  is clinical concern for acute cholecystitis. Pancreas: Unremarkable. No pancreatic ductal dilatation or surrounding inflammatory changes. Spleen: Normal in size without focal abnormality. Adrenals/Urinary Tract: The adrenal glands are unremarkable. There is no hydronephrosis on either side. There is symmetric enhancement and excretion of contrast by both kidneys. The visualized ureters and urinary bladder appear unremarkable. Stomach/Bowel: There is moderate stool throughout the colon. No bowel obstruction or active inflammation. The appendix is normal. Vascular/Lymphatic: Moderate aortoiliac atherosclerotic disease. The IVC is unremarkable. No portal venous gas. Upper abdominal collaterals. No adenopathy. Reproductive: The prostate and seminal vesicles are grossly unremarkable. Other: None Musculoskeletal: Osteopenia with degenerative changes. No acute osseous pathology. IMPRESSION: 1. No acute intra-abdominal or pelvic pathology. 2. Fatty liver with morphologic changes of cirrhosis. 3. Cholelithiasis. 4.  Aortic  Atherosclerosis (ICD10-I70.0). Electronically Signed   By: Elgie Collard M.D.   On: 03/15/2023 23:34    Microbiology: Results for orders placed or performed during the hospital encounter of 03/19/23  Blood culture (routine x 2)     Status: None   Collection Time: 03/19/23  8:50 PM   Specimen: BLOOD  Result Value Ref Range Status   Specimen Description   Final    BLOOD SITE NOT SPECIFIED Performed at [facility], [address] [place], [place] [address]    Special Requests   Final    BOTTLES DRAWN AEROBIC AND ANAEROBIC Blood Culture adequate volume Performed at [facility], [address] [place], [place] [address]    Culture   Final    NO GROWTH 5 DAYS Performed at [facility], [address], [place], [place] [address]    Report Status 03/24/2023 FINAL  Final  Blood culture (routine x 2)     Status: None (Preliminary result)   Collection Time: 03/19/23  9:19 PM   Specimen: BLOOD  Result Value Ref Range Status   Specimen Description   Final    BLOOD RIGHT ANTECUBITAL Performed at [facility], [address], [place], [place] [address]    Special Requests   Final    BOTTLES DRAWN AEROBIC AND ANAEROBIC Blood Culture adequate volume Performed at [facility], [address], [place], [place] [address]    Culture   Final    NO GROWTH 4 DAYS Performed at [facility], [address], [place], [place] [address]    Report Status PENDING  Incomplete   Labs: CBC: Recent Labs  Lab 03/19/23 2050 03/20/23 0906 03/21/23 0546 03/22/23 0537 03/23/23 0512 03/24/23 0521  WBC 11.4* 6.4 4.7 4.9 5.4 5.2  NEUTROABS 9.8* 5.0  --  3.3 3.4 3.1  HGB 14.0 13.5 12.5* 12.6* 13.7 13.0  HCT 40.4 40.3 37.4* 36.7* 40.6 38.7*  MCV 96.2 98.5 97.9 96.3 97.4 97.2  PLT 59* 51* 56* 63* 86* 85*   Basic Metabolic Panel: Recent Labs  Lab 03/20/23 0906 03/20/23 0939 03/21/23 0546 03/22/23 0537  03/23/23 0512 03/24/23 0521  NA 139  --  136 137 139 139  K 3.8  --  3.4* 3.6 4.1 3.7  CL 106  --  101 102 102 101  CO2 25  --  24 25 25 25   GLUCOSE 86  --  89 93 102* 101*  BUN 16  --  13 10 12 17   CREATININE 0.57*  --  0.56* 0.57* 0.58* 0.42*  CALCIUM 8.6*  --  8.5* 8.8* 9.1 9.1  MG 2.0  --   --  2.1  2.3 2.1  PHOS  --  2.9  --  3.4 3.2 4.0   Liver Function Tests: Recent Labs  Lab 03/19/23 2050 03/20/23 0939 03/22/23 0537 03/23/23 0512 03/24/23 0521  AST 30 24 32 41 33  ALT 24 20 31  38 33  ALKPHOS 48 42 62 69 66  BILITOT 0.8 1.1 1.1 1.1 0.9  PROT 7.2 5.8* 5.8* 6.1* 5.7*  ALBUMIN 4.1 3.5 3.1* 3.1* 3.0*   CBG: No results for input(s): "GLUCAP" in the last 168 hours.  Discharge time spent: greater than 30 minutes.  Signed: Marguerita Merles, DO [facility] 03/24/2023

## 2023-03-24 NOTE — Evaluation (Signed)
Occupational Therapy Evaluation Patient Details Name: [name] MRN: [id number] DOB: February 06, 1956 Today's Date: 03/24/2023   History of Present Illness 67 year old overweight Caucasian male with a past medical history significant for but not limited to alcoholism in remission, hepatitis C status posttreatment with Harvoni, cirrhosis, hypertension, seizure disorder, depression and anxiety as well as COPD who presented with right forearm pain and swelling but mainly it was in his hand.  He reports that he stopped 2 to 3 days ago and suffered some minor lacerations near the wrist and subsequently since then he developed progressive pain and swelling as well as redness to to the hand and wrist.   Clinical Impression   Patient evaluated by Occupational Therapy with no further acute OT needs identified. All education has been completed and the patient has no further questions. Pt educated on compensatory techniques for ADL completion and functional mobility. Provided with squeeze ball for right hand grip and ROM. No follow-up Occupational Therapy or equipment needs. OT is signing off. Thank you for this referral.        If plan is discharge home, recommend the following: Assist for transportation;Assistance with cooking/housework    Functional Status Assessment  Patient has had a recent decline in their functional status and demonstrates the ability to make significant improvements in function in a reasonable and predictable amount of time.  Equipment Recommendations  None recommended by OT       Precautions / Restrictions Precautions Precautions: Fall Restrictions Weight Bearing Restrictions: No      Mobility Bed Mobility    General bed mobility comments: Pt up in recliner upon therapy arrival    Transfers Overall transfer level: Modified independent Equipment used: None         Balance Overall balance assessment: History of Falls, Mild deficits observed, not formally  tested         ADL either performed or assessed with clinical judgement   ADL Overall ADL's : Modified independent     General ADL Comments: Pt requires increased time to complete BADL tasks as he is only using his LUE to assist. RUE pain level is preventing him from using it. Pt educated on hemi dressing technique when donning socks. Pt able to demonstrate understanding.     Vision Baseline Vision/History: 1 Wears glasses Ability to See in Adequate Light: 0 Adequate Patient Visual Report: No change from baseline Vision Assessment?: No apparent visual deficits     Perception Perception: Not tested       Praxis Praxis: Not tested       Pertinent Vitals/Pain Pain Assessment Pain Assessment: 0-10 Pain Score: 6  Pain Location: right hand/wrist region Pain Descriptors / Indicators: Aching, Grimacing Pain Intervention(s): Monitored during session, RN gave pain meds during session     Extremity/Trunk Assessment Upper Extremity Assessment Upper Extremity Assessment: Overall WFL for tasks assessed;Right hand dominant;RUE deficits/detail RUE Deficits / Details: right wrist and palm bandaged although pt able to demonstrate A/ROM [facility]. Pain is present with movement. Pt provided with squeeze ball to gentle squeeze/release throughout the day.Gross grasp is limited d/t pain RUE: Unable to fully assess due to pain   Lower Extremity Assessment Lower Extremity Assessment: Overall WFL for tasks assessed   Cervical / Trunk Assessment Cervical / Trunk Assessment: Normal   Communication Communication Communication: No apparent difficulties   Cognition Arousal: Alert Behavior During Therapy: WFL for tasks assessed/performed Overall Cognitive Status: Within Functional Limits for tasks assessed      General Comments  Right  hand/wrist bandaged. Nursing wrapping UE upon therapy arrival d/t skin weeping.            Home Living Family/patient expects to be discharged to:: Private  residence Living Arrangements: Alone Available Help at Discharge: Family;Available PRN/intermittently (Daughter lives close by. Sister is also available to assist if needed.) Type of Home: Mobile home Home Access: Stairs to enter Entrance Stairs-Number of Steps: 4-5 Entrance Stairs-Rails: Left Home Layout: One level     Bathroom Shower/Tub: Producer, television/film/video: Handicapped height (has toilet riser)     Home Equipment: Cane - single point;Shower seat;Grab bars - tub/shower;Toilet riser          Prior Functioning/Environment Prior Level of Function : Independent/Modified Independent        Mobility Comments: uses cane ADLs Comments: independent. Pt does not drive. Sister will drive him to shop for groceries and transport to appointments.        OT Problem List: Pain;Decreased strength;Impaired UE functional use         OT Goals(Current goals can be found in the care plan section) Acute Rehab OT Goals Patient Stated Goal: to get pain medication  OT Frequency:  1X visit       AM-PAC OT "6 Clicks" Daily Activity     Outcome Measure Help from another person eating meals?: A Little Help from another person taking care of personal grooming?: None Help from another person toileting, which includes using toliet, bedpan, or urinal?: None Help from another person bathing (including washing, rinsing, drying)?: None Help from another person to put on and taking off regular upper body clothing?: None Help from another person to put on and taking off regular lower body clothing?: None 6 Click Score: 23   End of Session    Activity Tolerance: Patient tolerated treatment well Patient left: in chair;with call bell/phone within reach  OT Visit Diagnosis: Muscle weakness (generalized) (M62.81);Pain Pain - Right/Left: Right Pain - part of body: Hand                Time: 2440-1027 OT Time Calculation (min): 27 min Charges:  OT General Charges $OT Visit: 1 Visit OT  Evaluation $OT Eval Moderate Complexity: 1 Mod OT Treatments $Therapeutic Activity: 8-22 mins  Limmie Patricia, OTR/L,CBIS  Supplemental OT - [facility] and [facility] Secure Chat Preferred    Vetra Shinall, Charisse March 03/24/2023, 9:40 AM

## 2023-03-24 NOTE — Plan of Care (Signed)
  Problem: Education: Goal: Knowledge of the prescribed therapeutic regimen will improve Outcome: Progressing Goal: Understanding of activity limitations/precautions following surgery will improve Outcome: Progressing Goal: Individualized Educational Video(s) Outcome: Progressing   Problem: Activity: Goal: Ability to tolerate increased activity will improve Outcome: Progressing   Problem: Pain Management: Goal: Pain level will decrease with appropriate interventions Outcome: Progressing   Problem: Education: Goal: Knowledge of General Education information will improve Description: Including pain rating scale, medication(s)/side effects and non-pharmacologic comfort measures Outcome: Progressing   Problem: Health Behavior/Discharge Planning: Goal: Ability to manage health-related needs will improve Outcome: Progressing   Problem: Clinical Measurements: Goal: Ability to maintain clinical measurements within normal limits will improve Outcome: Progressing Goal: Will remain free from infection Outcome: Progressing Goal: Diagnostic test results will improve Outcome: Progressing Goal: Respiratory complications will improve Outcome: Progressing Goal: Cardiovascular complication will be avoided Outcome: Progressing   Problem: Activity: Goal: Risk for activity intolerance will decrease Outcome: Progressing   Problem: Nutrition: Goal: Adequate nutrition will be maintained Outcome: Progressing   Problem: Coping: Goal: Level of anxiety will decrease Outcome: Progressing   Problem: Elimination: Goal: Will not experience complications related to bowel motility Outcome: Progressing Goal: Will not experience complications related to urinary retention Outcome: Progressing   Problem: Pain Managment: Goal: General experience of comfort will improve Outcome: Progressing   Problem: Safety: Goal: Ability to remain free from injury will improve Outcome: Progressing   Problem:  Skin Integrity: Goal: Risk for impaired skin integrity will decrease Outcome: Progressing   Problem: Clinical Measurements: Goal: Ability to avoid or minimize complications of infection will improve Outcome: Progressing   Problem: Skin Integrity: Goal: Skin integrity will improve Outcome: Progressing

## 2023-03-25 ENCOUNTER — Telehealth: Payer: Self-pay

## 2023-03-25 LAB — CULTURE, BLOOD (ROUTINE X 2)
Culture: NO GROWTH
Special Requests: ADEQUATE

## 2023-03-25 NOTE — Transitions of Care (Post Inpatient/ED Visit) (Signed)
03/25/2023  Name: [name] MRN: [id number] DOB: 24-Nov-1955  Today's TOC FU Call Status: Today's TOC FU Call Status:: Successful TOC FU Call Completed TOC FU Call Complete Date: 03/25/23 Patient's Name and Date of Birth confirmed.  Transition Care Management Follow-up Telephone Call Date of Discharge: 03/24/23 Discharge Facility: [facility]) Type of Discharge: Inpatient Admission Primary Inpatient Discharge Diagnosis:: cellulitis How have you been since you were released from the hospital?: Better Any questions or concerns?: No  Items Reviewed: Did you receive and understand the discharge instructions provided?: Yes Medications obtained,verified, and reconciled?: Yes (Medications Reviewed) Any new allergies since your discharge?: No Dietary orders reviewed?: Yes Do you have support at home?: No  Medications Reviewed Today: Medications Reviewed Today     Reviewed by Karena Addison, LPN (Licensed Practical Nurse) on 03/25/23 at 1209  Med List Status: <None>   Medication Order Taking? Sig Documenting Provider Last Dose Status Informant  acetaminophen (TYLENOL) 325 MG tablet [id number]  Take 2 tablets (650 mg total) by mouth every 6 (six) hours as needed for mild pain (or Fever >/= 101). Sheikh, Omair Latif, DO  Active   albuterol (PROVENTIL) (2.5 MG/3ML) 0.083% nebulizer solution [id number] No Take 3 mLs (2.5 mg total) by nebulization every 6 (six) hours as needed for wheezing or shortness of breath. Mayer Masker, PA-C Unknown Active Self, Pharmacy Records  albuterol (VENTOLIN HFA) 108 (90 Base) MCG/ACT inhaler [id number] No Inhale 2 puffs into the lungs every 6 (six) hours as needed for wheezing or shortness of breath. Carlean Jews, NP Unknown Active Self, Pharmacy Records  atorvastatin (LIPITOR) 20 MG tablet [id number] No TAKE 1 TABLET BY MOUTH AT BEDTIME. [name], MD 03/18/2023 Active Self, Pharmacy Records  busPIRone (BUSPAR) 15 MG tablet [id number] No  Take 15 mg by mouth 3 (three) times daily. [provider] 03/19/2023 Active Self, Pharmacy Records  ciprofloxacin (CIPRO) 500 MG tablet [id number]  Take 1 tablet (500 mg total) by mouth 2 (two) times daily for 14 days. Sheikh, Omair Latif, DO  Active   divalproex (DEPAKOTE) 500 MG DR tablet [id number] No Take 1 tablet (500 mg total) by mouth 3 (three) times daily. Anson Fret, MD 03/19/2023 Active Self, Pharmacy Records  doxycycline (VIBRA-TABS) 100 MG tablet [id number]  Take 1 tablet (100 mg total) by mouth every 12 (twelve) hours for 14 days. Sheikh, Omair Latif, DO  Active   Fluticasone-Umeclidin-Vilant (TRELEGY ELLIPTA) 100-62.5-25 MCG/ACT AEPB [id number] No Inhale 1 puff into the lungs daily. [name], MD 03/19/2023 Active Self, Pharmacy Records  lactulose [facility]) 10 GM/15ML solution [id number] No Take 30 mLs (20 g total) by mouth 2 (two) times daily. Hold after 2 loose BM every day  Patient not taking: Reported on 03/19/2023   Rolly Salter, MD Not Taking Active Self, Pharmacy Records           Med Note Lenoria Farrier   Thu Sep 18, 2022  4:00 PM)    lisinopril (ZESTRIL) 5 MG tablet [id number] No Take 1 tablet (5 mg total) by mouth daily. Carlean Jews, NP 03/19/2023 Active Self, Pharmacy Records  methocarbamol (ROBAXIN-750) 750 MG tablet [id number] No Take 1 tablet (750 mg total) by mouth every 8 (eight) hours as needed for muscle spasms. Yolonda Kida, MD Unknown Active Self, Pharmacy Records  metoprolol succinate (TOPROL-XL) 25 MG 24 hr tablet [id number] No Take 0.5 tablets (12.5 mg total) by mouth daily. [name], MD 03/19/2023 Active Self, Pharmacy Records  nitroGLYCERIN (  NITROSTAT) 0.4 MG SL tablet [id number] No Place 1 tablet (0.4 mg total) under the tongue every 5 (five) minutes as needed for chest pain. Carlean Jews, NP Unknown Active Self, Pharmacy Records  ondansetron (ZOFRAN) 4 MG tablet [id number] No Take 1 tablet (4 mg total) by mouth every 8  (eight) hours as needed for vomiting or nausea. Yolonda Kida, MD Unknown Active Self, Pharmacy Records  oxcarbazepine (TRILEPTAL) 600 MG tablet [id number] No Take 600 mg by mouth every evening. [provider] 03/18/2023 Active Self, Pharmacy Records           Med Note Antony Madura, Arn Medal   Wed Dec 25, 2021  7:58 PM)    prazosin (MINIPRESS) 1 MG capsule [id number] No Take 1 mg by mouth every morning. [provider] 03/19/2023 Active Self, Pharmacy Records  QUEtiapine (SEROQUEL XR) 200 MG 24 hr tablet [id number] No Take 200 mg by mouth at bedtime. Pt states he takes this medication BID [provider] 03/18/2023 Active Self, Pharmacy Records           Med Note Deloria Lair, DUROJAHYE' R   Thu Mar 19, 2023 10:11 PM) Pt states he takes this medication BID  senna-docusate (SENOKOT-S) 8.6-50 MG tablet [id number] No Take 1 tablet by mouth at bedtime as needed for moderate constipation. Miguel Rota, MD Unknown Active Self, Pharmacy Records  sertraline (ZOLOFT) 100 MG tablet [id number] No Take 200 mg by mouth in the morning. [provider] 03/19/2023 Active Self, Pharmacy Records  Suvorexant [facility]) 15 MG TABS [id number] No Take 1 tablet by mouth at bedtime as needed.  Patient taking differently: Take 1 tablet by mouth in the morning, at noon, and at bedtime.   Carlean Jews, NP 03/19/2023 Active Self, Pharmacy Records           Med Note (S[place]TER[place], Genoveva Ill   Tue Nov 04, 2022  2:45 PM) Patient verified he is taking 3 times a day   traZODone (DESYREL) 100 MG tablet [id number] No Take 200 mg by mouth at bedtime as needed for sleep. [provider] 03/18/2023 Active Self, Pharmacy Records  Med List Note Salvatore Marvel, CPhT 12/25/21 2008): Father's number is [phone]            Home Care and Equipment/Supplies: Were Home Health Services Ordered?: NA Any new equipment or medical supplies ordered?: NA  Functional Questionnaire: Do you need assistance  with bathing/showering or dressing?: Yes Do you need assistance with meal preparation?: Yes Do you need assistance with eating?: No Do you have difficulty maintaining continence: No Do you need assistance with getting out of bed/getting out of a chair/moving?: No Do you have difficulty managing or taking your medications?: No  Follow up appointments reviewed: PCP Follow-up appointment confirmed?: Yes Date of PCP follow-up appointment?: 04/07/23 Follow-up Provider: [facility] Follow-up appointment confirmed?: Yes Date of Specialist follow-up appointment?: 04/02/23 Follow-Up Specialty Provider:: Dr Zigmund Daniel Do you need transportation to your follow-up appointment?: No Do you understand care options if your condition(s) worsen?: Yes-patient verbalized understanding    SIGNATURE Karena Addison, LPN [facility] Nurse Health Advisor Direct Dial [phone]

## 2023-04-01 ENCOUNTER — Ambulatory Visit: Payer: Self-pay | Admitting: Cardiology

## 2023-04-02 ENCOUNTER — Other Ambulatory Visit: Payer: Self-pay

## 2023-04-02 ENCOUNTER — Other Ambulatory Visit (HOSPITAL_COMMUNITY): Payer: Self-pay

## 2023-04-02 ENCOUNTER — Telehealth: Payer: Self-pay

## 2023-04-02 ENCOUNTER — Encounter: Payer: Self-pay | Admitting: Infectious Diseases

## 2023-04-02 ENCOUNTER — Ambulatory Visit (INDEPENDENT_AMBULATORY_CARE_PROVIDER_SITE_OTHER): Payer: 59 | Admitting: Infectious Diseases

## 2023-04-02 VITALS — BP 118/68 | HR 62 | Resp 16 | Ht 68.0 in | Wt 167.5 lb

## 2023-04-02 DIAGNOSIS — Z7185 Encounter for immunization safety counseling: Secondary | ICD-10-CM

## 2023-04-02 DIAGNOSIS — K7460 Unspecified cirrhosis of liver: Secondary | ICD-10-CM | POA: Diagnosis not present

## 2023-04-02 DIAGNOSIS — L03119 Cellulitis of unspecified part of limb: Secondary | ICD-10-CM

## 2023-04-02 DIAGNOSIS — Z79899 Other long term (current) drug therapy: Secondary | ICD-10-CM
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
## 2023-04-02 NOTE — Telephone Encounter (Signed)
Patient sister Wallis Bamberg called wanting to know about patient visit today. Debra isn't listed on patient HIPAA form. Stanton Kidney stated that she thought she was. I told Stanton Kidney if the patient would like you added he can update the form at his next office visit on 10/29. Stanton Kidney voiced her understanding.    Sanuel Ladnier Lesli Albee, CMA

## 2023-04-07 ENCOUNTER — Inpatient Hospital Stay: Payer: 59 | Admitting: Family Medicine

## 2023-04-08 ENCOUNTER — Telehealth: Payer: Self-pay

## 2023-04-08 NOTE — Telephone Encounter (Signed)
Patient sister [name] called with some questions/ concerns. States that patient's younger sister [name] informed her the appointment with [facility] is not until 08/06/23. Would like to know if provider feels like having appointment that far out is okay or if we could help move appointment date up.  Is also asking how patient was reinfected with Hep C. Informed [name] since she is not listed as designated party release I could not relay health information, but would forward message to provider.  Would like to know if staff could help patient add her as designated party release at appointment. Is trying to help as much as she can.  [name] [name], RMA

## 2023-04-14 ENCOUNTER — Ambulatory Visit: Payer: 59 | Admitting: Infectious Diseases

## 2023-04-14 ENCOUNTER — Encounter: Payer: Self-pay | Admitting: Infectious Diseases

## 2023-04-14 ENCOUNTER — Other Ambulatory Visit: Payer: Self-pay

## 2023-04-14 VITALS — BP 101/66 | HR 65

## 2023-04-14 DIAGNOSIS — Z79899 Other long term (current) drug therapy: Secondary | ICD-10-CM

## 2023-04-14 DIAGNOSIS — Z23 Encounter for immunization: Secondary | ICD-10-CM

## 2023-04-14 DIAGNOSIS — L03119 Cellulitis of unspecified part of limb: Secondary | ICD-10-CM

## 2023-04-14 DIAGNOSIS — L03113 Cellulitis of right upper limb: Secondary | ICD-10-CM

## 2023-04-14 DIAGNOSIS — K7460 Unspecified cirrhosis of liver: Secondary | ICD-10-CM

## 2023-04-14 DIAGNOSIS — Z7185 Encounter for immunization safety counseling: Secondary | ICD-10-CM | POA: Diagnosis not present
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
## 2023-04-14 NOTE — Progress Notes (Signed)
Received notification from [facility] that patient has appointment scheduled with [facility] on 08/06/23 at 1:45 pm.   Sandie Ano, RN

## 2023-04-15 ENCOUNTER — Ambulatory Visit (INDEPENDENT_AMBULATORY_CARE_PROVIDER_SITE_OTHER): Payer: 59 | Admitting: Urology

## 2023-04-15 ENCOUNTER — Encounter: Payer: Self-pay | Admitting: Urology

## 2023-04-15 VITALS — BP 120/80 | HR 74 | Ht 68.0 in | Wt 167.0 lb

## 2023-04-15 DIAGNOSIS — R399 Unspecified symptoms and signs involving the genitourinary system: Secondary | ICD-10-CM | POA: Diagnosis not present

## 2023-04-15 DIAGNOSIS — R350 Frequency of micturition: Secondary | ICD-10-CM | POA: Diagnosis not present

## 2023-04-15 LAB — URINALYSIS, COMPLETE
Bilirubin, UA: NEGATIVE
Glucose, UA: NEGATIVE
Leukocytes,UA: NEGATIVE
Nitrite, UA: NEGATIVE
Protein,UA: NEGATIVE
Specific Gravity, UA: 1.025 (ref 1.005–1.030)
Urobilinogen, Ur: 1 mg/dL (ref 0.2–1.0)
pH, UA: 6 (ref 5.0–7.5)

## 2023-04-15 LAB — MICROSCOPIC EXAMINATION: Epithelial Cells (non renal): >10 /[HPF] — AB (ref 0–10)

## 2023-04-15 LAB — BLADDER SCAN AMB NON-IMAGING

## 2023-04-15 MED ORDER — SILODOSIN 8 MG PO CAPS: 8.0000 mg | ORAL_CAPSULE | Freq: Every day | ORAL | 1 refills | Status: DC

## 2023-04-15 MED ORDER — GEMTESA 75 MG PO TABS: 75.0000 mg | ORAL_TABLET | Freq: Every day | ORAL | Status: DC
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
## 2023-04-16 ENCOUNTER — Ambulatory Visit (INDEPENDENT_AMBULATORY_CARE_PROVIDER_SITE_OTHER): Payer: 59 | Admitting: Family Medicine

## 2023-04-16 ENCOUNTER — Encounter: Payer: Self-pay | Admitting: Family Medicine

## 2023-04-16 VITALS — BP 101/68 | HR 73 | Ht 68.0 in | Wt 178.1 lb

## 2023-04-16 DIAGNOSIS — J439 Emphysema, unspecified: Secondary | ICD-10-CM | POA: Diagnosis not present

## 2023-04-16 DIAGNOSIS — R634 Abnormal weight loss: Secondary | ICD-10-CM

## 2023-04-16 DIAGNOSIS — K7460 Unspecified cirrhosis of liver: Secondary | ICD-10-CM | POA: Diagnosis not present

## 2023-04-16 DIAGNOSIS — D696 Thrombocytopenia, unspecified: Secondary | ICD-10-CM

## 2023-04-16 DIAGNOSIS — R350 Frequency of micturition: Secondary | ICD-10-CM

## 2023-04-16 MED ORDER — ALBUTEROL SULFATE HFA 108 (90 BASE) MCG/ACT IN AERS: 2.0000 | INHALATION_SPRAY | Freq: Four times a day (QID) | RESPIRATORY_TRACT | 11 refills | Status: DC | PRN

## 2023-04-16 NOTE — Patient Instructions (Addendum)
It was nice to see you today,  We addressed the following topics today: -Please reach out to your neurologist office and asked them if you want to schedule a follow-up appointment anytime soon.  At that appointment you can asked them about your tremors if you have concerns.  The metoprolol that you are on should help with controlling them somewhat but they may have other medication options you can try. - Please make sure you follow-up with that hepatologist appointment.  This is the doctor who will manage most of your issues from your cirrhosis. -I will let you know the results of your lab test when I get them - Follow-up with me in 3 months  Have a great day,  Frederic Jericho, MD

## 2023-04-16 NOTE — Progress Notes (Signed)
Established Patient Office Visit  Subjective   Patient ID: [name], male    DOB: 1956/01/06  Age: 67 y.o. MRN: [id number]  Chief Complaint  Patient presents with   Hospitalization Follow-up    HPI  Right hand cellulitis-patient has finished antibiotics.  He followed up with the infectious disease specialist.  No issues currently with his hand.  Cirrhosis-I explained to the patient that cirrhosis is not reversible.  Explained to him that the current cirrhosis is from his previous hepatitis C infection most likely.  That does not mean he still has hepatitis C.  Advised him to keep the appointment with a hepatologist as it is important for him to complete the workup of his cirrhosis and manage appropriately.  Patient goes to [place] and sees Dr. Harvest Forest for his psychiatric issues.  The medications he has with him are consistent with the medications listed on his chart.  Patient uses his Trelegy inhaler every day.  Has not used albuterol in interim many months because he he ran out.  Patient states that his hands tremor so much that he has difficulty feeding himself.  Sometimes has to have others help feed him.  The ASCVD Risk score (Arnett DK, et al., 2019) failed to calculate for the following reasons:   The patient has a prior MI or stroke diagnosis  Health Maintenance Due  Topic Date Due   COVID-19 Vaccine (1) Never done   Colonoscopy  Never done   Pneumonia Vaccine 101+ Years old (2 of 2 - PCV) 07/03/2014   Medicare Annual Wellness (AWV)  04/19/2020      Objective:     BP 101/68   Pulse 73   Ht 5\' 8"  (1.727 m)   Wt 178 lb 1.9 oz (80.8 kg)   SpO2 96%   BMI 27.08 kg/m    Physical Exam General: Alert, oriented.  Wearing sunglasses and hat.  Using a cane Neuro: No resting tremor noted. Psych: Pleasant affect.    No results found for any visits on 04/16/23.      Assessment & Plan:   Hepatic cirrhosis, unspecified hepatic cirrhosis type, unspecified  whether ascites present [facility]) Assessment & Plan: Had a long discussion with the patient regarding the nature of cirrhosis and causes.  As well as the continued management after developing cirrhosis.  Strongly encouraged him to keep the appointment with the hepatologist.  Orders: -     Comprehensive metabolic panel -     Magnesium -     Phosphorus  Pulmonary emphysema, unspecified emphysema type [facility]) Assessment & Plan: Patient continues to use Trelegy inhaler.  Is out of albuterol.  Refill of albuterol today.Patient has decreased inspiratory capacity.  Otherwise normal lung exam.  Orders: -     Albuterol Sulfate HFA; Inhale 2 puffs into the lungs every 6 (six) hours as needed for wheezing or shortness of breath.  Dispense: 18 g; Refill: 11  Thrombocytopenia (HCC) -     CBC with Differential/Platelet  Unintentional weight loss Assessment & Plan: Weight loss appears stable at this time.  Possibly due to his difficulty feeding himself.  His chest x-ray was normal, did not show any evidence of cavitary lesions.  Abdominal CT scan did not show any abnormalities that could explain his weight loss.  continue to monitor weight.  Orders: -     Lactate dehydrogenase -     C-reactive protein -     CBC  Urinary frequency Assessment & Plan: Saw urology yesterday.  Is on a alpha-blocker and Gemtesa.  Continue to follow-up with urology.      Return in about 3 months (around 07/17/2023) for cirrhosis.    [name], MD

## 2023-04-16 NOTE — Assessment & Plan Note (Signed)
Patient continues to use Trelegy inhaler.  Is out of albuterol.  Refill of albuterol today.Patient has decreased inspiratory capacity.  Otherwise normal lung exam.

## 2023-04-16 NOTE — Assessment & Plan Note (Signed)
Weight loss appears stable at this time.  Possibly due to his difficulty feeding himself.  His chest x-ray was normal, did not show any evidence of cavitary lesions.  Abdominal CT scan did not show any abnormalities that could explain his weight loss.  continue to monitor weight.

## 2023-04-16 NOTE — Assessment & Plan Note (Signed)
Had a long discussion with the patient regarding the nature of cirrhosis and causes.  As well as the continued management after developing cirrhosis.  Strongly encouraged him to keep the appointment with the hepatologist.

## 2023-04-16 NOTE — Assessment & Plan Note (Signed)
Saw urology yesterday.  Is on a alpha-blocker and Gemtesa.  Continue to follow-up with urology.

## 2023-04-21 ENCOUNTER — Other Ambulatory Visit: Payer: 59

## 2023-04-22 LAB — COMPREHENSIVE METABOLIC PANEL
ALT: 31 [IU]/L (ref 0–44)
AST: 30 [IU]/L (ref 0–40)
Albumin: 4.2 g/dL (ref 3.9–4.9)
Alkaline Phosphatase: 66 [IU]/L (ref 44–121)
BUN/Creatinine Ratio: 22 (ref 10–24)
BUN: 15 mg/dL (ref 8–27)
Bilirubin Total: 0.3 mg/dL (ref 0.0–1.2)
CO2: 22 mmol/L (ref 20–29)
Calcium: 8.9 mg/dL (ref 8.6–10.2)
Chloride: 106 mmol/L (ref 96–106)
Creatinine, Ser: 0.68 mg/dL — ABNORMAL LOW (ref 0.76–1.27)
Globulin, Total: 2.1 g/dL (ref 1.5–4.5)
Glucose: 133 mg/dL — ABNORMAL HIGH (ref 70–99)
Potassium: 4.4 mmol/L (ref 3.5–5.2)
Sodium: 141 mmol/L (ref 134–144)
Total Protein: 6.3 g/dL (ref 6.0–8.5)
eGFR: 101 mL/min/{1.73_m2} (ref >59)

## 2023-04-22 LAB — CBC WITH DIFFERENTIAL/PLATELET
Basophils Absolute: 0 10*3/uL (ref 0.0–0.2)
Basos: 1 %
EOS (ABSOLUTE): 0.1 10*3/uL (ref 0.0–0.4)
Eos: 3 %
Hematocrit: 38.9 % (ref 37.5–51.0)
Hemoglobin: 13 g/dL (ref 13.0–17.7)
Immature Grans (Abs): 0 10*3/uL (ref 0.0–0.1)
Immature Granulocytes: 1 %
Lymphocytes Absolute: 0.8 10*3/uL (ref 0.7–3.1)
Lymphs: 25 %
MCH: 32.7 pg (ref 26.6–33.0)
MCHC: 33.4 g/dL (ref 31.5–35.7)
MCV: 98 fL — ABNORMAL HIGH (ref 79–97)
Monocytes Absolute: 0.4 10*3/uL (ref 0.1–0.9)
Monocytes: 13 %
Neutrophils Absolute: 1.9 10*3/uL (ref 1.4–7.0)
Neutrophils: 57 %
Platelets: 65 10*3/uL — CL (ref 150–450)
RBC: 3.97 x10E6/uL — ABNORMAL LOW (ref 4.14–5.80)
RDW: 14.4 % (ref 11.6–15.4)
WBC: 3.3 10*3/uL — ABNORMAL LOW (ref 3.4–10.8)

## 2023-04-22 LAB — MAGNESIUM: Magnesium: 2 mg/dL (ref 1.6–2.3)

## 2023-04-22 LAB — LACTATE DEHYDROGENASE: LDH: 172 [IU]/L (ref 121–224)

## 2023-04-22 LAB — C-REACTIVE PROTEIN: CRP: 1 mg/L (ref 0–10)

## 2023-04-22 LAB — PHOSPHORUS: Phosphorus: 3.8 mg/dL (ref 2.8–4.1)

## 2023-05-06 ENCOUNTER — Encounter: Payer: Self-pay | Admitting: Cardiology

## 2023-05-06 ENCOUNTER — Ambulatory Visit: Payer: 59 | Attending: Cardiology | Admitting: Cardiology

## 2023-05-06 VITALS — BP 104/66 | HR 64 | Ht 68.0 in | Wt 191.8 lb

## 2023-05-06 DIAGNOSIS — E782 Mixed hyperlipidemia: Secondary | ICD-10-CM | POA: Diagnosis not present

## 2023-05-06 DIAGNOSIS — I2510 Atherosclerotic heart disease of native coronary artery without angina pectoris: Secondary | ICD-10-CM

## 2023-05-06 NOTE — Patient Instructions (Signed)
Medication Instructions:   STOP TAKING LISINOPRIL NOW  *If you need a refill on your cardiac medications before your next appointment, please call your pharmacy*    Follow-Up:  AS NEEDED WITH DR. PATWARDHAN

## 2023-05-06 NOTE — Progress Notes (Signed)
  Cardiology Office Note:  .   Date:  05/06/2023  ID:  [name], DOB [birth year], MRN [id number] PCP: [name], MD  [facility] Providers Cardiologist:  Truett Mainland, MD PCP: [name], MD  Chief Complaint  Patient presents with   Coronary Artery Disease      History of Present Illness: [name]    AADESH GRANADOS is a 67 y.o. male with nicotine dependence, mixed hyperlipidemia, CAD s/p primary PCI to Lcx (2013) in the setting of cocaine abuse, thrombocytopenia, h/o alcohol abuse, h/o hep C-treated with Harvoni, liver cirrhosis  Patient was recently treated with antibiotics for hand cellulitis.  He has overall decreased energy, but denies any chest pain or shortness of breath symptoms.  Vitals:   05/06/23 0925  BP: 104/66  Pulse: 64  SpO2: 94%     ROS:  Review of Systems  Cardiovascular:  Negative for chest pain, dyspnea on exertion, leg swelling, palpitations and syncope.     Studies Reviewed: [name]        Independently interpreted Labs 04/2023: Platelets 65 k (chronic thrombocytopenia No other significant abnormality  Lexiscan Tetrofosmin stress test 10/20/2022: Lexiscan nuclear stress test performed using 1-day protocol.  SPECT mages showed medium intensity, fixed perfusion defect in basal inferior myocardium, and small sized, mild intensity, partial reversible perfusion defect in apical anterior myocardium.  Findings likely represent old infarct with minimal reversibility, as described above. Gated SPECT imaging of the left ventricle showed decreased myocardial wall motion and thickening and basal inferior myocardium. Stress LVEF 50%. Intermediate risk study.      Physical Exam:   Physical Exam Vitals and nursing note reviewed.  Constitutional:      General: He is not in acute distress. Neck:     Vascular: No JVD.  Cardiovascular:     Rate and Rhythm: Normal rate and regular rhythm.     Heart sounds: Normal heart sounds. No murmur  heard. Pulmonary:     Effort: Pulmonary effort is normal.     Breath sounds: Normal breath sounds. No wheezing or rales.  Musculoskeletal:     Right lower leg: No edema.     Left lower leg: No edema.      VISIT DIAGNOSES:   ICD-10-CM   1. Coronary artery disease involving native coronary artery of native heart without angina pectoris  I25.10     2. Mixed hyperlipidemia  E78.2        ASSESSMENT AND PLAN: .    DAVONTE MACVICAR is a 67 y.o. male with nicotine dependence, mixed hyperlipidemia, CAD s/p primary PCI to Lcx (2013) in the setting of cocaine abuse, thrombocytopenia, h/o alcohol abuse, h/o hep C-treated with Harvoni, liver cirrhosis  CAD: Prior history of MI in the setting of cocaine abuse. Not on aspirin due to chronic thrombocytopenia in the setting of liver cirrhosis. Continue metoprolol. Given his low normal blood pressure and cirrhosis, I have stopped his lisinopril. Continue low-dose statin.    F/u as needed  Signed, Elder Negus, MD

## 2023-05-08 ENCOUNTER — Other Ambulatory Visit: Payer: Self-pay | Admitting: Family Medicine

## 2023-05-18 NOTE — Telephone Encounter (Signed)
Good morning. This patient used to see me as PCP. You are now listed as primary care for them. Will you please fill this if appropriate. Thank you.  Herbert Seta, NP

## 2023-05-26 ENCOUNTER — Other Ambulatory Visit: Payer: Self-pay | Admitting: Nurse Practitioner

## 2023-05-26 DIAGNOSIS — I10 Essential (primary) hypertension: Secondary | ICD-10-CM

## 2023-05-26 NOTE — Telephone Encounter (Signed)
This one is also for you.

## 2023-06-01 ENCOUNTER — Telehealth: Payer: Self-pay | Admitting: Neurology

## 2023-06-01 NOTE — Telephone Encounter (Signed)
Pt's sister calling to confirm upcoming appt details

## 2023-06-11 ENCOUNTER — Other Ambulatory Visit: Payer: Self-pay | Admitting: Urology

## 2023-06-18 ENCOUNTER — Ambulatory Visit: Payer: 59

## 2023-06-18 ENCOUNTER — Ambulatory Visit: Payer: 59 | Admitting: Urology

## 2023-06-18 DIAGNOSIS — Z0000 Encounter for general adult medical examination without abnormal findings: Secondary | ICD-10-CM

## 2023-06-18 NOTE — Patient Instructions (Signed)
 [name] , Thank you for taking time to come for your Medicare Wellness Visit. I appreciate your ongoing commitment to your health goals. Please review the following plan we discussed and let me know if I can assist you in the future.   Referrals/Orders/Follow-Ups/Clinician Recommendations: none  This is a list of the screening recommended for you and due dates:  Health Maintenance  Topic Date Due   COVID-19 Vaccine (1) Never done   Pneumonia Vaccine (2 of 2 - PCV) 07/03/2014   Colon Cancer Screening  06/17/2024*   Medicare Annual Wellness Visit  06/17/2024   DTaP/Tdap/Td vaccine (3 - Td or Tdap) 12/30/2031   Flu Shot  Completed   Hepatitis C Screening  Completed   Zoster (Shingles) Vaccine  Completed   HPV Vaccine  Aged Out  *Topic was postponed. The date shown is not the original due date.    Advanced directives: (ACP Link)Information on Advanced Care Planning can be found at [place]  Secretary of [facility] Advance Health Care Directives Advance Health Care Directives ([email or web address]   Next Medicare Annual Wellness Visit scheduled for next year: Yes  insert Preventive Care attachment Insert FALL PREVENTION attachment if needed
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
## 2023-07-15 ENCOUNTER — Ambulatory Visit (INDEPENDENT_AMBULATORY_CARE_PROVIDER_SITE_OTHER): Payer: 59 | Admitting: Urology

## 2023-07-15 VITALS — BP 129/75 | HR 85 | Ht 68.0 in | Wt 191.5 lb

## 2023-07-15 DIAGNOSIS — R3129 Other microscopic hematuria: Secondary | ICD-10-CM

## 2023-07-15 DIAGNOSIS — R399 Unspecified symptoms and signs involving the genitourinary system: Secondary | ICD-10-CM

## 2023-07-15 MED ORDER — SOLIFENACIN SUCCINATE 10 MG PO TABS: 10.0000 mg | ORAL_TABLET | Freq: Every day | ORAL | 0 refills | Status: DC

## 2023-07-15 NOTE — Patient Instructions (Signed)
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
## 2023-07-17 ENCOUNTER — Encounter: Payer: Self-pay | Admitting: Urology

## 2023-07-20 ENCOUNTER — Ambulatory Visit (INDEPENDENT_AMBULATORY_CARE_PROVIDER_SITE_OTHER): Payer: 59 | Admitting: Family Medicine

## 2023-07-20 ENCOUNTER — Encounter: Payer: Self-pay | Admitting: Family Medicine

## 2023-07-20 VITALS — BP 97/64 | HR 77 | Ht 68.0 in | Wt 191.0 lb

## 2023-07-20 DIAGNOSIS — J439 Emphysema, unspecified: Secondary | ICD-10-CM | POA: Diagnosis not present

## 2023-07-20 DIAGNOSIS — F39 Unspecified mood [affective] disorder: Secondary | ICD-10-CM

## 2023-07-20 DIAGNOSIS — R55 Syncope and collapse: Secondary | ICD-10-CM | POA: Diagnosis not present

## 2023-07-20 DIAGNOSIS — R251 Tremor, unspecified: Secondary | ICD-10-CM | POA: Diagnosis not present

## 2023-07-20 DIAGNOSIS — G40909 Epilepsy, unspecified, not intractable, without status epilepticus: Secondary | ICD-10-CM | POA: Diagnosis not present

## 2023-07-20 DIAGNOSIS — R350 Frequency of micturition: Secondary | ICD-10-CM

## 2023-07-20 DIAGNOSIS — K7460 Unspecified cirrhosis of liver: Secondary | ICD-10-CM | POA: Diagnosis not present

## 2023-07-20 MED ORDER — PROPRANOLOL HCL ER 60 MG PO CP24: 60.0000 mg | ORAL_CAPSULE | Freq: Every day | ORAL | 2 refills | Status: DC

## 2023-07-20 MED ORDER — ALBUTEROL SULFATE HFA 108 (90 BASE) MCG/ACT IN AERS: 2.0000 | INHALATION_SPRAY | Freq: Four times a day (QID) | RESPIRATORY_TRACT | 11 refills | Status: DC | PRN

## 2023-07-20 NOTE — Progress Notes (Unsigned)
   Established Patient Office Visit  Subjective   Patient ID: [name], male    DOB: 1956-01-28  Age: 68 y.o. MRN: [id number]  Chief Complaint  Patient presents with   Medical Management of Chronic Issues    HPI  Cirrhosis- rudnick 2/26.    LUTS - silodosin, gemtesa, tamsulosin, oxybutynin.  Has cystoscopy scheduled? Solificenacin.  Not working.    Seizures - has upcoming appt  Two times.  Gets a hot flash.  Pass out twice.  3 days ago.  Doesn't think it was seizure.  Took thirty minutes to get up.  Thinks it took ten minutes to get up.  Cooking pork chops.    'heat flashes'   Paranoid - about general public.    Hears mom and dads voice . Mom good, dad negativ.e   feels useless.     The ASCVD Risk score (Arnett DK, et al., 2019) failed to calculate for the following reasons:   Risk score cannot be calculated because patient has a medical history suggesting prior/existing ASCVD  Health Maintenance Due  Topic Date Due   COVID-19 Vaccine (1) Never done   Pneumonia Vaccine 1+ Years old (2 of 2 - PCV) 07/03/2014      Objective:     BP 97/64   Pulse 77   Ht 5\' 8"  (1.727 m)   Wt 191 lb (86.6 kg)   SpO2 94%   BMI 29.04 kg/m  {Vitals History (Optional):23777}  Physical Exam   No results found for any visits on 07/20/23.      Assessment & Plan:   Pulmonary emphysema, unspecified emphysema type (HCC)     No follow-ups on file.    Darren Kitty, MD

## 2023-07-20 NOTE — Patient Instructions (Addendum)
It was nice to see you today,  We addressed the following topics today: -I have started a new medication called propranolol.  This is a medicine similar to metoprolol which you have previously taken.  This should help with tremors in your hands. - Do not take both propranolol and metoprolol.  If you have any metoprolol left at home get rid of it. - At your next appointment I can freeze off the spot on your wrist. - Talk to your psychiatrist about your feeling paranoid, hearing voices, and your feelings of questioning your purpose. - Your fainting episodes or something called vasovagal syncope.  They generally cannot be treated with medications, the treatment is to avoid things that can trigger vasovagal episodes and if you feel when coming on get to some place where you can sit down.   Have a great day,  Frederic Jericho, MD

## 2023-07-21 DIAGNOSIS — R55 Syncope and collapse: Secondary | ICD-10-CM | POA: Insufficient documentation

## 2023-07-21 DIAGNOSIS — R251 Tremor, unspecified: Secondary | ICD-10-CM | POA: Insufficient documentation

## 2023-07-21 NOTE — Assessment & Plan Note (Signed)
Has upcoming appt to establish with hepatologist.

## 2023-07-21 NOTE — Assessment & Plan Note (Signed)
Likely essential tremor.  Prescribing propranalol. Was taking metoprolol previously but not currently and pt is unsure why he stopped. Will also be beneficial in setting of his cirrhosis.  Advised him to mention this to his neurologist at their next visit.

## 2023-07-21 NOTE — Assessment & Plan Note (Signed)
Has f/u with neurology upcoming.  Endorses compliance with medications.  Recent episodes of LoC seem more consistent with vasovagal syncope.

## 2023-07-21 NOTE — Assessment & Plan Note (Signed)
Has tried gemtesa, tamsulosin, and oxybutynin.  Currently on vesicare.  Has upcoming appt with urology.

## 2023-07-21 NOTE — Assessment & Plan Note (Signed)
 2 episodes of syncope preceded by feeling flushed and diaphoresis.  Episodes dont sound consistent with his known seizure disorder.  Recent echocardiogram did not show any findings to suggest cardiogenic cause. Most recent EKG did not show any arrhythmia.  Discussed importance of avoiding triggers and recognizing prodomal symptoms and finding a safe place to sit down if he starts to feel presyncopal again.

## 2023-07-21 NOTE — Assessment & Plan Note (Signed)
 Having worsening paranoia, auditory hallucinations, and depressed mood.  Sees psychiatry and they prescribe several psychiatric medications for him, so no adjustments were made to his medications.  Advised pt to discuss this further with psych at their next visit.  I do not have notes from his visit with psychiatry.

## 2023-07-29 ENCOUNTER — Other Ambulatory Visit: Payer: Self-pay | Admitting: Family Medicine

## 2023-07-31 ENCOUNTER — Encounter: Payer: Self-pay | Admitting: Adult Health

## 2023-07-31 ENCOUNTER — Ambulatory Visit (INDEPENDENT_AMBULATORY_CARE_PROVIDER_SITE_OTHER): Payer: 59 | Admitting: Adult Health

## 2023-07-31 VITALS — BP 73/58 | HR 61 | Ht 68.0 in | Wt 189.0 lb

## 2023-07-31 DIAGNOSIS — G40409 Other generalized epilepsy and epileptic syndromes, not intractable, without status epilepticus: Secondary | ICD-10-CM

## 2023-07-31 DIAGNOSIS — R251 Tremor, unspecified: Secondary | ICD-10-CM

## 2023-07-31 NOTE — Patient Instructions (Signed)
Check at home to see if you are taking propranolol  Blood work today  Continue Depakote 500 mg three times a day
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
## 2023-08-01 LAB — CBC WITH DIFFERENTIAL/PLATELET
Basophils Absolute: 0 10*3/uL (ref 0.0–0.2)
Basos: 1 %
EOS (ABSOLUTE): 0.2 10*3/uL (ref 0.0–0.4)
Eos: 2 %
Hematocrit: 45.7 % (ref 37.5–51.0)
Hemoglobin: 16 g/dL (ref 13.0–17.7)
Immature Grans (Abs): 0 10*3/uL (ref 0.0–0.1)
Immature Granulocytes: 1 %
Lymphocytes Absolute: 1.4 10*3/uL (ref 0.7–3.1)
Lymphs: 23 %
MCH: 34.2 pg — ABNORMAL HIGH (ref 26.6–33.0)
MCHC: 35 g/dL (ref 31.5–35.7)
MCV: 98 fL — ABNORMAL HIGH (ref 79–97)
Monocytes Absolute: 0.7 10*3/uL (ref 0.1–0.9)
Monocytes: 12 %
Neutrophils Absolute: 3.9 10*3/uL (ref 1.4–7.0)
Neutrophils: 61 %
Platelets: 89 10*3/uL — CL (ref 150–450)
RBC: 4.68 x10E6/uL (ref 4.14–5.80)
RDW: 14.4 % (ref 11.6–15.4)
WBC: 6.2 10*3/uL (ref 3.4–10.8)

## 2023-08-01 LAB — COMPREHENSIVE METABOLIC PANEL
ALT: 26 [IU]/L (ref 0–44)
AST: 23 [IU]/L (ref 0–40)
Albumin: 4.3 g/dL (ref 3.9–4.9)
Alkaline Phosphatase: 58 [IU]/L (ref 44–121)
BUN/Creatinine Ratio: 20 (ref 10–24)
BUN: 17 mg/dL (ref 8–27)
Bilirubin Total: 0.5 mg/dL (ref 0.0–1.2)
CO2: 21 mmol/L (ref 20–29)
Calcium: 8.9 mg/dL (ref 8.6–10.2)
Chloride: 104 mmol/L (ref 96–106)
Creatinine, Ser: 0.84 mg/dL (ref 0.76–1.27)
Globulin, Total: 2.2 g/dL (ref 1.5–4.5)
Glucose: 102 mg/dL — ABNORMAL HIGH (ref 70–99)
Potassium: 4.2 mmol/L (ref 3.5–5.2)
Sodium: 138 mmol/L (ref 134–144)
Total Protein: 6.5 g/dL (ref 6.0–8.5)
eGFR: 94 mL/min/{1.73_m2} (ref >59)

## 2023-08-01 LAB — VALPROIC ACID LEVEL: Valproic Acid Lvl: 104 ug/mL — ABNORMAL HIGH (ref 50–100)

## 2023-08-03 ENCOUNTER — Ambulatory Visit: Payer: Self-pay | Admitting: Family Medicine

## 2023-08-03 ENCOUNTER — Telehealth: Payer: Self-pay | Admitting: *Deleted

## 2023-08-03 ENCOUNTER — Ambulatory Visit (INDEPENDENT_AMBULATORY_CARE_PROVIDER_SITE_OTHER): Payer: 59 | Admitting: Family Medicine

## 2023-08-03 VITALS — BP 111/67 | HR 51 | Ht 68.0 in | Wt 186.2 lb

## 2023-08-03 DIAGNOSIS — Z79899 Other long term (current) drug therapy: Secondary | ICD-10-CM | POA: Diagnosis not present

## 2023-08-03 NOTE — Progress Notes (Signed)
Pt in office to have BP check, he brought his 2 bp machines that he had at home.  Arm bp machine is 126/69 and the wrist machine was 121/69.

## 2023-08-03 NOTE — Telephone Encounter (Signed)
Pt returning call.  I don't see that we called pt.  I did see that his labs ready, but not reviewed by NP/MD.  Will forward to Dr. [name] since Aundra Millet NP out today.  I asked pt if he was taking propranolol, and he stated he is 60mg  po daily.  I told him to reach out to pcp who is prescriber and let him know that his bp was low.  (Also to mention about fatigue which Aundra Millet, NP mentioned as well).  He said he would.  He does not drive.  He does not have device to take Bp.  He verbalized understanding of plan.  Pcp is able to review note in epic.

## 2023-08-03 NOTE — Telephone Encounter (Signed)
Can you call the patient and advised him to hold off on the propranolol.  Also if his blood pressure is still less than 90 systolic he should still go to the emergency department.  I would still like to see him in the office sometime this week if possible.

## 2023-08-03 NOTE — Telephone Encounter (Signed)
Copied from CRM [id number]. Topic: Clinical - Red Word Triage >> Aug 03, 2023 12:00 PM Fredrica W wrote: Red Word that prompted transfer to Nurse Triage: Low BP 73/58 when he was at neurologist on Friday. Provider told patient to call PCP.   Chief Complaint: Low Blood Pressure Symptoms: dizziness and light headed Frequency: persistent Pertinent Negatives: Patient denies chest pain Disposition: [x] ED /[] Urgent Care (no appt availability in office) / [] Appointment(In office/virtual)/ []  [facility]/ [] Home Care/ [] Refused Recommended Disposition /[] [place] [facility]/ []  Follow-up with PCP Additional Notes: Pt reports low blood pressure reading at his neuro appt on Friday. Per chart review, BP was 73/58 in the office. Pt sts that he is currently laying down and feels lightheaded. Pt reports that he did take his blood pressure medicine this morning (Prazosin and Propanolol) RN advising EMS, pt refused. Sts that his sister is on her way to his house and he wants to wait on her so she can check his BP. RN placed call to his sister, no answer, unable to leave a vmail. Pt still refusing EMS at this time  Reason for Disposition  [1] Systolic BP < 90 AND [2] dizzy, lightheaded, or weak  Answer Assessment - Initial Assessment Questions 1. BLOOD PRESSURE: "What is the blood pressure?" "Did you take at least two measurements 5 minutes apart?"     Blood pressure was 73/58 on Friday at neuro appt  2. ONSET: "When did you take your blood pressure?"     Unsure of when pressure is low, does not have a cuff at home  3. HOW: "How did you obtain the blood pressure?" (e.g., visiting nurse, automatic home BP monitor)     At dr appt  4. HISTORY: "Do you have a history of low blood pressure?" "What is your blood pressure normally?"    Normally has high blood pressure, on 2 BP meds  5. MEDICINES: "Are you taking any medications for blood pressure?" If Yes, ask: "Have they been changed recently?"      Inderal LA 60mg  q daily and Prazosin 2mg   6. PULSE RATE: "Do you know what your pulse rate is?"      Unsure  7. OTHER SYMPTOMS: "Have you been sick recently?" "Have you had a recent injury?"     Light headed  8. PREGNANCY: "Is there any chance you are pregnant?" "When was your last menstrual period?"     N/a  Protocols used: Blood Pressure - Low-A-AH

## 2023-08-03 NOTE — Telephone Encounter (Signed)
Per provider, patient will be in today to have a BP check and will bring bp machines to be sure he is able to use them correctly.  Pt is scheduled to be seen on 08/05/23 however he is thinking that he may not make this appt due to weather and needing enough time to get transportation.

## 2023-08-03 NOTE — Telephone Encounter (Signed)
Patient has transportation issues but was able to come today since he was already out at the [employer].  His blood pressure here was normal.  He was not complaining of any symptoms.  Advised him how to check his blood pressure at home with the blood pressure cuffs that he brought with him including wrist and bicep cuff.  Gave him a sheet for recording blood pressure readings and advised him if it gets below 90 systolic again he should let us know and stop taking the propranolol.

## 2023-08-04 NOTE — Telephone Encounter (Signed)
Pt states that from when he was seen on 2-14 he was told to call and speak with Aundra Millet, NP on Monday 2-17, pt was informed she was not in on yesterday, he is now asking for Aundra Millet, NP to call him when she is available.

## 2023-08-05 ENCOUNTER — Ambulatory Visit: Payer: 59 | Admitting: Family Medicine

## 2023-08-05 ENCOUNTER — Telehealth: Payer: Self-pay | Admitting: Adult Health

## 2023-08-05 DIAGNOSIS — G40409 Other generalized epilepsy and epileptic syndromes, not intractable, without status epilepticus: Secondary | ICD-10-CM

## 2023-08-05 NOTE — Progress Notes (Addendum)
Patient left office without having a BP recheck. However I see where he contacted his PCP on 2/17 regarding BP after speaking to RN at our office. (I was out of office on 2/17) PCP saw patient and BP was in normal range. I tried to call the patient today to review lab work

## 2023-08-05 NOTE — Telephone Encounter (Addendum)
I called and spoke to the patient.  We reviewed his lab work.  His platelets have been chronically low since 2013.  I discussed this in the past with Dr. Daisy Blossom.  Since it was low prior to him starting Depakote we do not feel that Depakote is the cause.  He has followed with oncology in the past.  His Depakote level is slightly elevated.  However this was not a trough level.  He had taken a noon dose before his blood work.  Liver function is in normal range.  I discussed with him about his tremor.  He is taking propranolol.  There was some questions in the office visit whether he was taking this or not.  He does state that he is taking it.  Not sure how much propranolol is actually helping his tremor he reports.  In the office visit his blood pressure was low.  We discussed rechecking his blood pressure before he left.  We did blood work in the office and then he left without having his blood pressure rechecked.  He acknowledges this.  He called his PCP on Monday after calling out office.  They were able to see him in the office and check his blood pressure which was in normal range.  He states that he has 2 blood pressure cuffs at home.  He has been checking it as directed by PCP but sometimes the blood pressure fluctuates quite a bit.  He does note that if his systolic blood pressure is less than 90 he is to hold his medication.  He has a follow-up appointment with his PCP this coming week.  I also discussed with the patient that his tremor could be induced by Depakote.  He has not had a recent seizure.  We discussed potentially trying a different seizure medication.  But I did explain the risk of breakthrough seizures when switching medication.  Advised that I would discuss with Dr. [name] and then be in touch.  Also sent a staff message to his PCP to advise of plan. Spent approximately 30 minutes on the phone with the patient.

## 2023-08-06 NOTE — Telephone Encounter (Signed)
I called patient to let him know that I was discussing with Dr. [name] and we would be in touch.  Patient called the office back. I spoke to him- let him know that Dr. [name] would be reviewing his chart then we would decide about his medication. He also reported that his BP has been in normal range.

## 2023-08-06 NOTE — Telephone Encounter (Signed)
 See other phone note

## 2023-08-10 ENCOUNTER — Encounter: Payer: Self-pay | Admitting: Family Medicine

## 2023-08-10 ENCOUNTER — Ambulatory Visit (INDEPENDENT_AMBULATORY_CARE_PROVIDER_SITE_OTHER): Payer: 59 | Admitting: Family Medicine

## 2023-08-10 VITALS — BP 102/59 | HR 45 | Ht 68.0 in | Wt 188.1 lb

## 2023-08-10 DIAGNOSIS — I959 Hypotension, unspecified: Secondary | ICD-10-CM | POA: Insufficient documentation

## 2023-08-10 DIAGNOSIS — L989 Disorder of the skin and subcutaneous tissue, unspecified: Secondary | ICD-10-CM | POA: Insufficient documentation

## 2023-08-10 NOTE — Assessment & Plan Note (Signed)
 Patient returning with a log of his blood pressures at home that have all been normal while on propranolol.  Advised him he can continue taking his propranolol.  Discussed reasons to stop including symptoms of dizziness or lightheadedness accompanied by low blood pressure or blood pressure is lower than 90 systolic confirmed with repeat.

## 2023-08-10 NOTE — Progress Notes (Unsigned)
   Established Patient Office Visit  Subjective   Patient ID: [name], male    DOB: 11-Apr-1956  Age: 68 y.o. MRN: [id number]  Chief Complaint  Patient presents with   Medical Management of Chronic Issues    HPI  Patient has been taking his blood pressure at home.  He brought in a log of his blood pressure values.  They have all been well above in the 100 systolics ranging from 100-1 40s mostly with some higher than that.  Patient has remained asymptomatic since then.  He has continued to take his propranolol.  Patient had concern of lesion on his right wrist that has been there for several years or more.  We discussed cryotherapy.  Discussed how we would not be able to know if this is cancerous or precancerous without a biopsy.  Patient is okay with this.  Had a similar lesion biopsy on his forearm.   The ASCVD Risk score (Arnett DK, et al., 2019) failed to calculate for the following reasons:   Risk score cannot be calculated because patient has a medical history suggesting prior/existing ASCVD  Health Maintenance Due  Topic Date Due   COVID-19 Vaccine (1) Never done   Pneumonia Vaccine 80+ Years old (2 of 2 - PCV) 07/03/2014      Objective:     BP (!) 102/59   Pulse (!) 45   Ht 5\' 8"  (1.727 m)   Wt 188 lb 1.9 oz (85.3 kg)   SpO2 98%   BMI 28.60 kg/m  {Vitals History (Optional):23777}  Physical Exam General: Alert, oriented Pulmonary: No respiratory distress Psych: Pleasant Skin: 0.8 cm circular papular lesion on the dorsal aspect of the right wrist.  Scaly.   No results found for any visits on 08/10/23.      Assessment & Plan:   Hypotension, unspecified hypotension type Assessment & Plan: Patient returning with a log of his blood pressures at home that have all been normal while on propranolol.  Advised him he can continue taking his propranolol.  Discussed reasons to stop including symptoms of dizziness or lightheadedness accompanied by low blood  pressure or blood pressure is lower than 90 systolic confirmed with repeat.   Skin lesion Assessment & Plan: Papular lesion on the right dorsal wrist.  Discussed removal with cryotherapy today.  Advised patient that we are unable to test for malignancy if using cryoablation versus excision.  Patient understands and wishes to proceed.  Gave verbal consent.  See accompanying procedure note.  Will follow-up with him at his next scheduled visit next month.      No follow-ups on file.    [name], MD

## 2023-08-10 NOTE — Patient Instructions (Addendum)
 It was nice to see you today,  We addressed the following topics today: -You can continue taking your propranolol.  Your blood pressure was okay today.  If you start to notice that your blood pressure is low again let us know.  You can always stop this medication if you choose.  It is purely for your symptoms for tremor. - For your spot on your wrist I used cryotherapy.  I would use soap and water to clean it and you can apply a thin layer of Vaseline over it while it is healing. - If you have any concerns about your wrist you can call us and leave a message.  Have a great day,  Frederic Jericho, MD

## 2023-08-10 NOTE — Assessment & Plan Note (Signed)
 Papular lesion on the right dorsal wrist.  Discussed removal with cryotherapy today.  Advised patient that we are unable to test for malignancy if using cryoablation versus excision.  Patient understands and wishes to proceed.  Gave verbal consent.  See accompanying procedure note.  Will follow-up with him at his next scheduled visit next month.

## 2023-08-10 NOTE — Progress Notes (Unsigned)
 Cryotherapy Procedure Note  Pre-operative Diagnosis: Suspicious lesion  Post-operative Diagnosis: Suspicious lesion  Locations: right dorsal wrist  Indications: painful lesion.  Possible malignancy  Anesthesia: not required   Procedure Details  History of allergy to iodine: no. Pacemaker? no.  Patient informed of risks (permanent scarring, infection, light or dark discoloration, bleeding, infection, weakness, numbness and recurrence of the lesion) and benefits of the procedure and verbal informed consent obtained.  The areas are treated with liquid nitrogen therapy, frozen until ice ball extended 2 mm beyond lesion, allowed to thaw, and treated again. The patient tolerated procedure well.  The patient was instructed on post-op care, warned that there may be blister formation, redness and pain. Recommend OTC analgesia as needed for pain.  Condition: Stable  Complications: none.  Plan: 1. Instructed to keep the area dry and covered for 24-48h and clean thereafter. 2. Warning signs of infection were reviewed.   3. Recommended that the patient use OTC acetaminophen as needed for pain.  4. Return in 3 week.

## 2023-08-10 NOTE — Telephone Encounter (Signed)
 Does not look like he has evert tried keppra. You can start him on keppra 750mg  bid (I like the ER BID if his insurance approves it) and we can slowly titrate him off of depakote over a month. But the risk is breakthrough seizure.

## 2023-08-11 NOTE — Telephone Encounter (Signed)
 Can you call patient and let them know of Dr. Trevor Mace recommendation below.  If he wants to start Keppra we can place the order.  He can do this for 2 to 3 weeks and then we will slowly start weaning down Depakote.  If he does want to start Keppra please review potential side effects with the patient.

## 2023-08-12 ENCOUNTER — Other Ambulatory Visit (HOSPITAL_COMMUNITY): Payer: Self-pay | Admitting: Gastroenterology

## 2023-08-12 DIAGNOSIS — K7460 Unspecified cirrhosis of liver: Secondary | ICD-10-CM | POA: Diagnosis not present

## 2023-08-12 NOTE — Telephone Encounter (Signed)
 I called the pt back and he had me on speaker phone so his sister could join. We discussed the offer to start patient on Keppra with the ultimate goal to switch completely from Depakote as the Depakote may potentially be causing his tremors. The patient agreed. He and his sister had several questions which I answered. We discussed some of the potential common side effects such as drowsiness, irritability and other psychiatric side effects. Patient was encouraged to review the drug information that will come with the medication. His sister informed me that he was prescribed two new medications by his psychiatrist yesterday (doxepin hcl 25 mg and desvenlafaxine ER 100 mg). We went through pt's other medications and I made updates to his med list according to what information they have with them. The patient will await a call back from [place] with instructions. He is aware that once he starts the Keppra we would like to know how he is doing about 2-3 weeks later so we can start to back off the Depakote if he is doing well. He and his sister thanked me for the call and verbalized understanding.

## 2023-08-12 NOTE — Telephone Encounter (Signed)
 I spoke with the pt briefly but he was at the doctor's office (liver specialist) so he handed the phone to his sister [name] (on [place]). We discussed the message below from [facility] NP. Pt's sister feels pretty sure he will agree to start keppra because the tremors are so bothersome, but she will call me back after he is finished at the doctor's office and I will discuss with him to confirm. She thanked me for the call.

## 2023-08-12 NOTE — Telephone Encounter (Signed)
Pt returning phone call, would like a call back 

## 2023-08-12 NOTE — Addendum Note (Signed)
 Addended by: Bertram Savin on: 08/12/2023 04:52 PM   Modules accepted: Orders

## 2023-08-13 ENCOUNTER — Other Ambulatory Visit: Payer: Self-pay | Admitting: Urology

## 2023-08-13 ENCOUNTER — Telehealth: Payer: Self-pay

## 2023-08-13 NOTE — Telephone Encounter (Signed)
 Spoke with Marcelino Duster.  Talked about the patient's medications.  Patient should only be taking propranolol right now, metoprolol has been stopped.  It is okay if he switches to carvedilol.  I can discuss this with the patient at the next time I see him on 3/17.

## 2023-08-13 NOTE — Telephone Encounter (Signed)
 Copied from CRM [id number]. Topic: Clinical - Medical Advice >> Aug 13, 2023 10:41 AM Antwanette L wrote: Reason for CRM: Marcelino Duster from [facility] is calling to speak with Dr. Constance Goltz or his nurse regarding a change in the patient medicine. Patient is currently taking propranolol ER (INDERAL LA) 60 MG 24 hr capsule and metoprolol tartrate (LOPRESSOR) 25 MG tablet. Dr.Rudnick wants to know if the medicine can be switched to Carvedilol because the patient is being treated for cirrhosis. If the patient is on the carvedio, Dr. Carrie Mew can avoid screening for varices. Please contact Marcelino Duster at [phone]

## 2023-08-17 ENCOUNTER — Telehealth: Payer: Self-pay | Admitting: *Deleted

## 2023-08-17 MED ORDER — LEVETIRACETAM ER 750 MG PO TB24: 750.0000 mg | ORAL_TABLET | Freq: Two times a day (BID) | ORAL | 5 refills | Status: DC

## 2023-08-17 NOTE — Addendum Note (Signed)
 Addended by: Enedina Finner on: 08/17/2023 02:50 PM   Modules accepted: Orders

## 2023-08-17 NOTE — Telephone Encounter (Signed)
 Copied from CRM [id number]. Topic: General - Other >> Aug 17, 2023 12:24 PM [name] wrote: Reason for CRM: Needs call back has concerns.[name] on meds.. was told taking too much meds on certain meds per his Liver Doctor - wants call back for clarity - [phone]

## 2023-08-17 NOTE — Telephone Encounter (Signed)
 I placed order for Keppra ER. Will follow-up with patient in 1 week.

## 2023-08-18 ENCOUNTER — Telehealth: Payer: Self-pay

## 2023-08-18 ENCOUNTER — Telehealth: Payer: Self-pay | Admitting: Family Medicine

## 2023-08-18 NOTE — Telephone Encounter (Signed)
 Tried calling the patient but I did not get an answer.  We switched his metoprolol to propranolol at the beginning of the year.  He should not be taking both of them.  He should only be taking the 1 called propranolol.  Please call him to let him know this.

## 2023-08-18 NOTE — Telephone Encounter (Signed)
 Copied from CRM [id number]. Topic: Clinical - Prescription Issue >> Aug 18, 2023 10:14 AM Izetta Dakin wrote: Reason for CRM: Patient called in regarding the following medications, states another provider told him he his taking to much of these medications. Callback # [phone].   propranolol ER (INDERAL LA) 60 MG 24 hr capsule atorvastatin (LIPITOR) 20 MG tablet metoprolol tartrate (LOPRESSOR) 25 MG tablet

## 2023-08-18 NOTE — Telephone Encounter (Signed)
 Copied from CRM [id number]. Topic: Clinical - Medication Question >> Aug 18, 2023  3:31 PM Tiffany S wrote: Reason for CRM: Patient stated that liver doctor  told him he is taking too much medication but he is unsure on which medications he needs to cut back on.

## 2023-08-19 NOTE — Telephone Encounter (Signed)
 Called pt LVM to contact the office

## 2023-08-19 NOTE — Telephone Encounter (Signed)
 I called pt back.  He received the levetiracetam, he was asking about the taper of depakote.  I relayed that per last note, that MM/NP wanted him to start the levetiracetam  750mg  po bid and take both this and depakote 500mg  po tid for 2-3 wks, and then will call us about depakote taper.  He stated received desvelafaxine 100mg  and has 50mg  tab as well.  Mental Health provider prescribes.  He is to call them about dose of this.  He said he verbalized understanding of plan.  Will let us know how he is doing in a weeks time. He appreciated call back.

## 2023-08-19 NOTE — Telephone Encounter (Signed)
 Pt is asking for a call to discuss how Megan, NP will plan on weaning him off of the  divalproex (DEPAKOTE) 500 MG DR tablet as a result of a new medication being called in for him

## 2023-08-20 NOTE — Telephone Encounter (Signed)
 Called pt he is advised to take his propranolol went over his medication with him he verbalize what he was taking he is clear about not taking metoprolol

## 2023-08-21 ENCOUNTER — Other Ambulatory Visit: Payer: Self-pay | Admitting: Urology

## 2023-08-21 ENCOUNTER — Ambulatory Visit (HOSPITAL_COMMUNITY)
Admission: RE | Admit: 2023-08-21 | Discharge: 2023-08-21 | Disposition: A | Discharge status: Home / Self Care | Source: Ambulatory Visit | Attending: Gastroenterology | Admitting: Gastroenterology

## 2023-08-21 DIAGNOSIS — K7460 Unspecified cirrhosis of liver: Secondary | ICD-10-CM | POA: Insufficient documentation

## 2023-08-21 DIAGNOSIS — K8020 Calculus of gallbladder without cholecystitis without obstruction: Secondary | ICD-10-CM | POA: Diagnosis not present

## 2023-08-21 DIAGNOSIS — K7689 Other specified diseases of liver: Secondary | ICD-10-CM | POA: Diagnosis not present

## 2023-08-26 ENCOUNTER — Telehealth: Payer: Self-pay | Admitting: Family Medicine

## 2023-08-26 NOTE — Telephone Encounter (Signed)
 Per chart review, propranolol 60 mg ER was signed on 07/20/2023 with 2 refills. Patient called and explained that patient should speak with pharmacy, [facility], about requesting his refill on this medication. Patient states that he already spoke with pharmacy. States pharmacy told him to call office but couldn't elaborate on why. This RN explained to patient that he should be able to obtain his medication through his pharmacy but if he had any issues to call back. Patient verbalized understanding.   Copied from CRM [id number]. Topic: Clinical - Medication Refill >> Aug 26, 2023  9:20 AM Mayer Masker wrote: Most Recent Primary Care Visit:  Provider: [name]  Department: [facility]  Visit Type: ACUTE  Date: 08/10/2023  Medication: propranolol ER (INDERAL LA) 60 MG 24 hr capsule [[id number]]  Has the patient contacted their pharmacy? Yes (Agent: If no, request that the patient contact the pharmacy for the refill. If patient does not wish to contact the pharmacy document the reason why and proceed with request.) (Agent: If yes, when and what did the pharmacy advise?)  Is this the correct pharmacy for this prescription? Yes If no, delete pharmacy and type the correct one.  This is the patient's preferred pharmacy:  [facility] - [place], [place] - [address] [place] [place] [address] Phone: [phone] Fax: [phone]   Has the prescription been filled recently? Yes  Is the patient out of the medication? Yes  Has the patient been seen for an appointment in the last year OR does the patient have an upcoming appointment? Yes  Can we respond through MyChart? No   Agent: Please be advised that Rx refills may take up to 3 business days. We ask that you follow-up with your pharmacy.

## 2023-08-27 NOTE — Telephone Encounter (Signed)
 I spoke with the patient. He states the Keppra "seems to be working alright". He is not having any side effects that he is aware of. The first two days he had diarrhea but that resolved. He is on board with continuing plan. We will check in again with him next week for potential decrease of Depakote if he is still doing well. I told him to call us in the meantime with any updates. He thanked me for the call.

## 2023-08-27 NOTE — Telephone Encounter (Signed)
 Please call and check and see how the patient is doing on Keppra.  He should have been on it 1 week now.  If he is doing well we will give him another week and then start weaning down Depakote

## 2023-08-31 ENCOUNTER — Encounter: Payer: Self-pay | Admitting: Family Medicine

## 2023-08-31 ENCOUNTER — Ambulatory Visit (INDEPENDENT_AMBULATORY_CARE_PROVIDER_SITE_OTHER): Payer: 59 | Admitting: Family Medicine

## 2023-08-31 VITALS — BP 114/70 | HR 48 | Ht 68.0 in | Wt 195.1 lb

## 2023-08-31 DIAGNOSIS — L989 Disorder of the skin and subcutaneous tissue, unspecified: Secondary | ICD-10-CM | POA: Diagnosis not present

## 2023-08-31 DIAGNOSIS — D696 Thrombocytopenia, unspecified: Secondary | ICD-10-CM | POA: Diagnosis not present

## 2023-08-31 DIAGNOSIS — R251 Tremor, unspecified: Secondary | ICD-10-CM

## 2023-08-31 DIAGNOSIS — F3170 Bipolar disorder, currently in remission, most recent episode unspecified: Secondary | ICD-10-CM

## 2023-08-31 DIAGNOSIS — K7460 Unspecified cirrhosis of liver: Secondary | ICD-10-CM

## 2023-08-31 DIAGNOSIS — G40909 Epilepsy, unspecified, not intractable, without status epilepticus: Secondary | ICD-10-CM

## 2023-08-31 DIAGNOSIS — I5032 Chronic diastolic (congestive) heart failure: Secondary | ICD-10-CM

## 2023-08-31 DIAGNOSIS — D61818 Other pancytopenia: Secondary | ICD-10-CM

## 2023-08-31 DIAGNOSIS — J449 Chronic obstructive pulmonary disease, unspecified: Secondary | ICD-10-CM

## 2023-08-31 DIAGNOSIS — F1911 Other psychoactive substance abuse, in remission: Secondary | ICD-10-CM

## 2023-08-31 NOTE — Assessment & Plan Note (Addendum)
 There was some confusion with the patient regarding his propranolol  and metoprolol  use.  When propranolol  was started patient was advised to no longer take metoprolol  and had already endorsed not taking it prior to that.  Today, we went over his medications and he had his bottles of propranolol  but also a bottle of metoprolol  that was filled on 3/12, 5 days ago.  Advised him that he should no longer be taking this, dispose of it for him, and call [facility] to notify him that metoprolol  is no longer on his medication list.  Patient's hepatologist wanted to put him on carvedilol  after his initial visit but deferred to us .  Because of the recent confusion we will avoid any changes on this visit, but if patient brings his medications to his next visit in 3 months we can switch to carvedilol  as long as we dispose of his propranolol  for him first.

## 2023-08-31 NOTE — Assessment & Plan Note (Signed)
 Patient desired repeat cryotherapy on the right wrist or lesion.  Does appear decreased in size.  Cryotherapy was applied 2 times, approximately 12 to 15 seconds each time until the lesion and 3 mm margins surrounding it were white.

## 2023-08-31 NOTE — Patient Instructions (Signed)
 It was nice to see you today,  We addressed the following topics today: -I would like to see back in 3 months. - I have taken away your metoprolol.  You should only be taking the propranolol at this time. - I have sent in a order for a heart monitor.  This will be mailed to you and will put it on for 14 days and then mail it back.  Once I get the results I will let you know.  Have a great day,  Frederic Jericho, MD

## 2023-08-31 NOTE — Progress Notes (Addendum)
 Established Patient Office Visit  Subjective   Patient ID: [name], male    DOB: 18-Aug-1955  Age: 68 y.o. MRN: [id number]  Chief Complaint  Patient presents with   Medical Management of Chronic Issues    HPI Patient brought in his medications.  We went over these.  He says he is currently in the process of weaning off Depakote  with his neurologist.  Patient is prescribed desvenlafaxine, doxepin, prazosin , oxcarbazepine , trazodone  by [facility] at [facility].  Has an appointment upcoming with them soon.  Patient says his Solifenacin  is not working for him.  Has a follow-up with the urologist in the next few weeks.  Also has palms with neurology and his psychiatrist upcoming soon.  We looked through all the patient's medication and he has 2 bottles of propranolol  and 1 bottle of metoprolol .  We again discussed with him that these medications should not be taken together.  The metoprolol  was filled on 3/12.  He is okay if I take the metoprolol  bottle to dispose of it.  Advised him we would talk to [facility] about taking this medication off his list.  Patient again mentions his syncopal episodes that we talked about last time.  Can feel them coming on.  States he feels like his blood pressure is really high then he falls.  1 time while he was cooking supper, the other time when he was walking down his steps.  Denies diaphoresis when asked this time.   The ASCVD Risk score (Arnett DK, et al., 2019) failed to calculate for the following reasons:   Risk score cannot be calculated because patient has a medical history suggesting prior/existing ASCVD  Health Maintenance Due  Topic Date Due   COVID-19 Vaccine (1) Never done   Pneumococcal Vaccine: 50+ Years (2 of 2 - PCV) 07/03/2014      Objective:     BP 114/70   Pulse (!) 48   Ht 5' 8 (1.727 m)   Wt 195 lb 1.9 oz (88.5 kg)   SpO2 99%   BMI 29.67 kg/m    Physical Exam General: Alert, oriented Pulmonary: No  respiratory distress Skin: Papular lesion on the right dorsum of the wrist is smaller in size today.  Patient request to have cryotherapy treatment again on this.   No results found for any visits on 08/31/23.      Assessment & Plan:   Bipolar affective disorder in remission [facility]) Assessment & Plan: Patient follows regularly with [facility].  We went over his medications which he brought with him.  Currently prescribed desvenlafaxine, doxepin, prazosin , trazodone  and oxcarbazepine  by [facility]   Hepatic cirrhosis, unspecified hepatic cirrhosis type, unspecified whether ascites present [facility]) Assessment & Plan: Patient recently saw his hepatologist for initial visit.  Hepatologist would like to switch propranolol  to carvedilol .  This would help patient avoid having to do endoscopy for varices.  Will hold off on doing this now since he had so much confusion with taking the propranolol  and metoprolol  together.  Now that we have removed his metoprolol  and removed it from his pharmacy, if we decide to switch to carvedilol  in an upcoming visit we will need to make sure he has his medication bottles with him so that we can remove old bottles and then called the pharmacy to confirm old prescriptions are removed.   Tremor of both hands Assessment & Plan: There was some confusion with the patient regarding his propranolol  and metoprolol  use.  When propranolol  was  started patient was advised to no longer take metoprolol  and had already endorsed not taking it prior to that.  Today, we went over his medications and he had his bottles of propranolol  but also a bottle of metoprolol  that was filled on 3/12, 5 days ago.  Advised him that he should no longer be taking this, dispose of it for him, and call [facility] to notify him that metoprolol  is no longer on his medication list.  Patient's hepatologist wanted to put him on carvedilol  after his initial visit but deferred to us .  Because of the recent  confusion we will avoid any changes on this visit, but if patient brings his medications to his next visit in 3 months we can switch to carvedilol  as long as we dispose of his propranolol  for him first.   Skin lesion Assessment & Plan: Patient desired repeat cryotherapy on the right wrist or lesion.  Does appear decreased in size.  Cryotherapy was applied 2 times, approximately 12 to 15 seconds each time until the lesion and 3 mm margins surrounding it were white.   Thrombocytopenia (HCC) Assessment & Plan: Secondary to liver disease.  Has begun following with hepatology.  Continue to monitor with routine CBCs.    I spent 30 minutes in the management of this patient  Return in about 3 months (around 12/01/2023) for hld, HTN, chronic issues.    Toribio MARLA Slain, MD

## 2023-09-02 NOTE — Telephone Encounter (Signed)
 I called the patient and LVM asking for him to call us back to discuss how he is doing after another week on his new medication. Left office number for call back.

## 2023-09-02 NOTE — Assessment & Plan Note (Signed)
 Patient recently saw his hepatologist for initial visit.  Hepatologist would like to switch propranolol to carvedilol.  This would help patient avoid having to do endoscopy for varices.  Will hold off on doing this now since he had so much confusion with taking the propranolol and metoprolol together.  Now that we have removed his metoprolol and removed it from his pharmacy, if we decide to switch to carvedilol in an upcoming visit we will need to make sure he has his medication bottles with him so that we can remove old bottles and then called the pharmacy to confirm old prescriptions are removed.

## 2023-09-02 NOTE — Assessment & Plan Note (Signed)
 Patient follows regularly with [facility].  We went over his medications which he brought with him.  Currently prescribed desvenlafaxine, doxepin, prazosin, trazodone and oxcarbazepine by [facility]

## 2023-09-02 NOTE — Assessment & Plan Note (Signed)
 Secondary to liver disease.  Has begun following with hepatology.  Continue to monitor with routine CBCs.

## 2023-09-03 NOTE — Telephone Encounter (Signed)
 I called the patient again. He confirmed he is still taking Levetiracetam XR 750 mg BID in addition to the Depakote 500 mg TID. He denies side effects of drowsiness or irritability, for example. He denies any seizures in the last 1.5  yrs or so. He said this AM he woke up with diarrhea again but he had not noticed any otherwise since the first 2 days he started the keppra. He said everything else is the same medication-wise. He said he still doesn't have a good appetite but this has been ongoing for about 2 years. I informed him I would check with Aundra Millet NP about her plan for reducing Depakote. He thanked me for the call.

## 2023-09-07 MED ORDER — DIVALPROEX SODIUM 250 MG PO DR TAB: DELAYED_RELEASE_TABLET | ORAL | 3 refills | Status: DC

## 2023-09-07 NOTE — Telephone Encounter (Signed)
 I will decrease Depakote to the 250 mg tablet   Week 1: Decrease Depakote to 250 mg in the AM, 500 mg at noon and bedtime  Week 2: Decrease Depakote to 250 mg  in the AM and at noon, continue 500 mg at bedtime  Week 3: Decrease Depakote to 250 mg TID.   Make sure patient is aware that its 250 mg tablet- so when he has to take 500 mg its will be 2 tablets.   During this time continue the same dose of Keppra. Again make the patient aware there is a chance for a breakthrough seizure while switching meds. At the end of week three he can call us. Continue Depakote 250 TID until we make further changes.   I will CC Dr. [name] on this note so she is also aware of the weaning schedule.

## 2023-09-07 NOTE — Addendum Note (Signed)
 Addended by: Enedina Finner on: 09/07/2023 09:54 AM   Modules accepted: Orders

## 2023-09-07 NOTE — Telephone Encounter (Signed)
 I spoke with the patient and discussed his new weaning instructions for the Depakote. He said he had just picked up the new prescription about 2 hours ago.  I discussed the weaning schedule clearly as noted below by [facility] NP and also advised him that the bottle instructions will also state the weaning schedule.  He is going to prep his medication a week in advance and will start the wean tomorrow. He will keep the rest of his 500 mg tablets separate from the 250 mg.  He is aware that when the bottle says he takes 500 mg, that means 2 tablets of the 250 mg.  He confirmed he is comfortable making the note on his calendar for each week he is to wean and he will call us at the end of the 3 weeks to discuss the next steps.  He is aware that there is a chance for breakthrough seizure while he is switching medications.  He is aware that at the end of the 3 weeks he is to continue Depakote 250 mg 3 times daily until we make further changes.  He will continue the Keppra 750 mg twice daily as well.  I have asked him to call us if he has any questions or concerns that come up before the end of the 3 weeks.  He verbalized understanding and thanked me for the call.

## 2023-09-14 ENCOUNTER — Other Ambulatory Visit: Payer: 59 | Admitting: Urology

## 2023-09-15 ENCOUNTER — Other Ambulatory Visit: Payer: Self-pay | Admitting: Urology

## 2023-09-24 ENCOUNTER — Telehealth: Payer: Self-pay | Admitting: Adult Health

## 2023-09-24 ENCOUNTER — Other Ambulatory Visit: Payer: Self-pay | Admitting: Family Medicine

## 2023-09-24 NOTE — Telephone Encounter (Signed)
 Called to reschedule 02/03/24 appt due to NP schedule change. Pt stated that he has been having diarrhea since starting keppra and depakote and was wanting to know if this was normal?

## 2023-09-24 NOTE — Telephone Encounter (Signed)
 I called pt and he states to me that he has been having loose stools since taking the keppra.  He states that it is getting better.  I told him to stay the course.  This was different from previous messages.  He is on depakote 250mg  po tid,  he is to remain on this (there are 3 refills).  If there is any worsening to let us know.  I would let MM/NP know.  He appreciated call back.

## 2023-10-05 MED ORDER — DIVALPROEX SODIUM 250 MG PO DR TAB: 250.0000 mg | DELAYED_RELEASE_TABLET | Freq: Two times a day (BID) | ORAL | 1 refills | Status: DC

## 2023-10-05 NOTE — Telephone Encounter (Signed)
 I merged this latest message into the previous phone note for continuity of care.

## 2023-10-05 NOTE — Telephone Encounter (Addendum)
 I called the patient to check on him. He reports resolution of diarrhea. He is now having normal stools and he denies any other noticeable side effects to keppra  and no seizures. He confirms he remains on keppra  750 mg BID and Depakote  250 mg TID. I told him we would be back in touch with weaning instructions for Depakote  and he verbalized understanding.

## 2023-10-05 NOTE — Telephone Encounter (Signed)
 I spoke with Jacqlyn Matas NP who provided the next weaning order which is to decrease Depakote  to 250 mg BID, after 2 weeks check in for further instructions. I spoke with the patient and discussed plan of care. He is aware not to stop the Depakote  abruptly but after 2 weeks at 250 mg BID he is to call our office. He should also call us  if he has any questions or changes once he starts to wean further. He is aware that there is a continued risk for breakthrough seizure right now while he is weaning medication. Patient said he is in need of a refill soon. I sent an updated prescription to his pharmacy reflecting this change. His questions were answered and he verbalized appreciation.

## 2023-10-05 NOTE — Addendum Note (Signed)
 Addended by: Burns Carwin on: 10/05/2023 04:50 PM   Modules accepted: Orders

## 2023-10-06 ENCOUNTER — Other Ambulatory Visit: Payer: Self-pay | Admitting: Urology

## 2023-10-20 NOTE — Telephone Encounter (Signed)
 Can we call the patient and see how he is doing on the decrease.  If he has continued taking Depakote  250 mg twice a day he can reduce to 250 mg at bedtime for 1 week then stop the medication he will continue on Keppra 

## 2023-10-21 NOTE — Telephone Encounter (Signed)
 I spoke with the patient to make sure he is still on the Depakote  250 mg BID and keppra  xr 750 mg BID with no seizures or issues. His only issue he notes is continued diarrhea. He states it was not present before starting keppra . His diarrhea had only gotten better for 2-3 days before it came back. He is taking OTC anti-diarrhea medication. He states it is liquid not just a loose stool. I did encourage him to stay hydrated with rule of thumb being 4 of the 16 oz water  bottles daily. Also follow instructions for the diarrhea medication and do not overuse. I told him I would call back after letting Jacqlyn Matas know the update. He was appreciative.

## 2023-10-22 NOTE — Telephone Encounter (Signed)
 I called the patient. Diarrhea started after Keppra . He seems to have reported to staff mix messages- saying it was better. However today he clarifies that he has only had 2-3 days without diarrhea since started Keppra . No change in diet. Hasn't started any other medication. No other symptoms. He states tremor has improved since we have been weaning off Depakote . Advised that I would discuss with Dr. Tresia Fruit about starting a different medication.

## 2023-10-23 ENCOUNTER — Telehealth: Payer: Self-pay | Admitting: *Deleted

## 2023-10-23 NOTE — Telephone Encounter (Signed)
 Copied from CRM [id number]. Topic: Appointments - Scheduling Inquiry for Clinic >> Oct 23, 2023 10:55 AM Dorthula Gavel H wrote: Reason for CRM: pt called in stating his Neurologist wants him to be seen by his pcp due to him having Diarrhea and she wanting to figure out if it is being caused by one of his medications he is taking. Pt's pcp doesn't having any availability until July.

## 2023-10-23 NOTE — Telephone Encounter (Signed)
 I called the patient this morning.  After looking on up-to-date diarrhea is not a commonly reported side effect of Keppra  although of course individuals can have side effects that are not typically reported.  Patient states that he typically notices loose stools with his morning bowel movement.  He states in the afternoon his stools are more formed.  Normal in color.  he has been taking Pepto-Bismol but not on a daily basis only if he has more than 1 loose stool.  Denies any other symptoms.  No significant fatigue, itching, abdominal pain or swelling.  He also states that he recently started Ambien .  According to my last office note it was not listed on his medication list.  He has an appointment with his psychiatrist this afternoon.  I encouraged him to talk to him about Ambien  to see if he feels that diarrhea could be a potential side effect?  Explained to the patient that I would like to rule out other causes of diarrhea before assuming that it is the Keppra  as he has not had any seizure events as we have weaned him off of Depakote .  Patient voiced understanding.  He will call and make an appointment with his PCP today.  I advised patient that I will follow back up with him on Monday.  He was encouraged to stay well-hydrated.  Continue all current medications as ordered.  Once he follows up with PCP if they feel there is no other cause of diarrhea then we may consider switching him to Briviact or zonisamide.  Patient also has a history of HCV cirrhosis.  Last had lab work in February with [facility].  Also had an ultrasound that did not show liver cancer per their notes.  Advised that if symptoms worsen or he develops new symptoms he should let us  know.  Certainly for any concerning symptoms he should seek care at urgent care or ED.  I discussed plan of care with Dr. Tresia Fruit.  Will forward my note to her.

## 2023-10-23 NOTE — Telephone Encounter (Signed)
 Contacted pt and he said he would see you at his appointment on 6/18.

## 2023-10-27 ENCOUNTER — Telehealth: Payer: Self-pay | Admitting: Adult Health

## 2023-10-27 NOTE — Telephone Encounter (Signed)
 I called the patient.  He states that he did try to see his psychiatrist on Friday but he waited 6 hours and never saw them.  He states that since Friday he has not had any loose stools.  Denies taking any over-the-counter medications such as Pepto-Bismol.  He states that his stools are formed and normal.  He does have a visit with his primary care in June.  For now he will continue on Keppra  XR 750 mg twice a day.  He has continued to wean off of Depakote .  I reviewed weaning instructions with him.  He should be off this medication within the next week.

## 2023-11-16 ENCOUNTER — Telehealth: Payer: Self-pay | Admitting: Adult Health

## 2023-11-16 NOTE — Telephone Encounter (Signed)
 Pt called wanting to inform provider that he has completely weaned himself off of the divalproex  (DEPAKOTE ) 250 MG DR tablet and he would like to know when he can start the new medication. Please advise.

## 2023-11-16 NOTE — Telephone Encounter (Signed)
 I called pt and LMVM for him.  That got his message.  He is off depakote  now.  He is taking levetiracetam  ER 750mg  po bid. That is the medication he is to continue.  No other new medication unless side effects or having seizures.  Please return call.

## 2023-11-20 ENCOUNTER — Other Ambulatory Visit: Payer: Self-pay | Admitting: Family Medicine

## 2023-11-25 NOTE — Telephone Encounter (Signed)
 We have not heard back from patient. I called pt and LVM asking for him to call us  back. Left office number and hours in message.
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
## 2023-11-26 NOTE — Addendum Note (Signed)
 Addended by: Burns Carwin on: 11/26/2023 04:03 PM   Modules accepted: Orders

## 2023-11-26 NOTE — Telephone Encounter (Addendum)
 I called pt back and LVM due to need to clarify his medication (keppra ). I called the pharmacy and canceled the Depakote  since patient is confirming to us  that he has actually been off of it.

## 2023-11-26 NOTE — Telephone Encounter (Signed)
 Pt has called to report that he has been off of the depakote  for weeks, he has been on his new seizure medication(he does not recall the name of it, he said its a medication he takes twice a day) and has not had any seizures.

## 2023-12-02 ENCOUNTER — Ambulatory Visit (INDEPENDENT_AMBULATORY_CARE_PROVIDER_SITE_OTHER): Admitting: Family Medicine

## 2023-12-02 ENCOUNTER — Encounter: Payer: Self-pay | Admitting: Family Medicine

## 2023-12-02 VITALS — BP 121/80 | HR 50 | Ht 68.0 in | Wt 213.0 lb

## 2023-12-02 DIAGNOSIS — E785 Hyperlipidemia, unspecified: Secondary | ICD-10-CM | POA: Diagnosis not present

## 2023-12-02 DIAGNOSIS — K7460 Unspecified cirrhosis of liver: Secondary | ICD-10-CM

## 2023-12-02 DIAGNOSIS — F5104 Psychophysiologic insomnia: Secondary | ICD-10-CM

## 2023-12-02 DIAGNOSIS — I159 Secondary hypertension, unspecified: Secondary | ICD-10-CM

## 2023-12-02 DIAGNOSIS — R5383 Other fatigue: Secondary | ICD-10-CM | POA: Diagnosis not present

## 2023-12-02 DIAGNOSIS — J439 Emphysema, unspecified: Secondary | ICD-10-CM

## 2023-12-02 DIAGNOSIS — R251 Tremor, unspecified: Secondary | ICD-10-CM

## 2023-12-02 MED ORDER — ALBUTEROL SULFATE (2.5 MG/3ML) 0.083% IN NEBU: 2.5000 mg | INHALATION_SOLUTION | Freq: Four times a day (QID) | RESPIRATORY_TRACT | 1 refills | Status: AC | PRN

## 2023-12-02 MED ORDER — ALBUTEROL SULFATE HFA 108 (90 BASE) MCG/ACT IN AERS
2.0000 | INHALATION_SPRAY | Freq: Four times a day (QID) | RESPIRATORY_TRACT | 11 refills | Status: AC | PRN
Start: 2023-12-02 — End: ?

## 2023-12-02 MED ORDER — ZOLPIDEM TARTRATE 10 MG PO TABS
10.0000 mg | ORAL_TABLET | Freq: Every evening | ORAL | 1 refills | Status: AC | PRN
Start: 2023-12-02 — End: 2024-01-01

## 2023-12-02 NOTE — Progress Notes (Unsigned)
   Established Patient Office Visit  Subjective   Patient ID: [name], male    DOB: Sep 14, 1955  Age: 68 y.o. MRN: [id number]  No chief complaint on file.   HPI  Syncopal episodes?    Keppra ?   Wrist lesion   No energy   The ASCVD Risk score (Arnett DK, et al., 2019) failed to calculate for the following reasons:   Risk score cannot be calculated because patient has a medical history suggesting prior/existing ASCVD  Health Maintenance Due  Topic Date Due   COVID-19 Vaccine (1) Never done   Pneumococcal Vaccine: 50+ Years (2 of 2 - PCV) 07/03/2014      Objective:     There were no vitals taken for this visit. {Vitals History (Optional):23777}  Physical Exam   No results found for any visits on 12/02/23.      Assessment & Plan:   There are no diagnoses linked to this encounter.   No follow-ups on file.    Toribio MARLA Slain, MD

## 2023-12-02 NOTE — Patient Instructions (Addendum)
 It was nice to see you today,  We addressed the following topics today: - I am getting some labs regarding your energy levels. - I am sending in the Ambien  to [place] but there are other doctor will need to refill the other ones after this month. -I am sending in refills of your albuterol .  Have a great day,  Etha Henle, MD

## 2023-12-07 ENCOUNTER — Other Ambulatory Visit

## 2023-12-07 DIAGNOSIS — E785 Hyperlipidemia, unspecified: Secondary | ICD-10-CM

## 2023-12-07 DIAGNOSIS — R5383 Other fatigue: Secondary | ICD-10-CM

## 2023-12-07 NOTE — Assessment & Plan Note (Signed)
-   Currently on Ambien  and doxepin prescribed by psychiatrist - Refilled Ambien  for one month, will need follow-up with psychiatrist for further refills - Continues hydroxyzine  three times daily for anxiety

## 2023-12-07 NOTE — Assessment & Plan Note (Signed)
 No more issues with syncopal episodes now that he is only taking the propranolol 

## 2023-12-07 NOTE — Assessment & Plan Note (Signed)
-   Multifactorial - likely related to poor sleep, possible sleep apnea, medications - Will check thyroid  function and other labs to evaluate fatigue

## 2023-12-09 ENCOUNTER — Telehealth: Payer: Self-pay

## 2023-12-09 ENCOUNTER — Other Ambulatory Visit

## 2023-12-09 NOTE — Telephone Encounter (Signed)
 Copied from CRM [id number]. Topic: General - Call Back - No Documentation >> Dec 09, 2023  2:03 PM Wess RAMAN wrote: Reason for CRM: Patient's sister, Sahej Schrieber, wanted Dr. Chandra to give patient a call due to difficulty sleeping because of his next door neighbors. Patient is also scared to go to sleep and scared to go outside. He also feels that no one cares about him.  Patient callback #: [phone]

## 2023-12-31 ENCOUNTER — Other Ambulatory Visit: Payer: Self-pay | Admitting: Urology

## 2024-01-04 ENCOUNTER — Other Ambulatory Visit: Payer: Self-pay | Admitting: Family Medicine

## 2024-01-04 DIAGNOSIS — M79672 Pain in left foot: Secondary | ICD-10-CM

## 2024-01-04 DIAGNOSIS — R0781 Pleurodynia: Secondary | ICD-10-CM

## 2024-01-11 ENCOUNTER — Other Ambulatory Visit

## 2024-01-11 ENCOUNTER — Other Ambulatory Visit: Payer: Self-pay | Admitting: Family Medicine

## 2024-01-12 ENCOUNTER — Other Ambulatory Visit

## 2024-01-13 ENCOUNTER — Other Ambulatory Visit: Payer: Self-pay | Admitting: Family Medicine

## 2024-01-14 ENCOUNTER — Telehealth: Payer: Self-pay | Admitting: Adult Health

## 2024-01-14 NOTE — Telephone Encounter (Signed)
 Appointment details confirmed

## 2024-01-18 NOTE — Telephone Encounter (Signed)
 Pt has called to confirm appointment details for 01/19/24

## 2024-01-19 ENCOUNTER — Telehealth: Payer: Self-pay | Admitting: Adult Health

## 2024-01-19 ENCOUNTER — Ambulatory Visit: Admitting: Adult Health

## 2024-01-19 NOTE — Telephone Encounter (Signed)
 Appointment cx, pt sick will call back to r/s

## 2024-01-28 ENCOUNTER — Ambulatory Visit: Payer: Self-pay

## 2024-01-28 NOTE — Telephone Encounter (Signed)
 FYI Only or Action Required?: FYI only for provider.  Patient was last seen in primary care on 12/02/2023 by Chandra Toribio POUR, MD.  Called Nurse Triage reporting Arm Injury and Fall.  Symptoms began several days ago.  Interventions attempted: Rest, hydration, or home remedies.  Symptoms are: unchanged.  Triage Disposition: See HCP Within 4 Hours (Or PCP Triage)  Patient/caregiver understands and will follow disposition?: Yes  Copied from CRM #[id number]. Topic: Clinical - Red Word Triage >> Jan 28, 2024  4:25 PM Zebedee SAUNDERS wrote: Red Word that prompted transfer to Nurse Triage: Pt's left arm injury with bleed and painful, fell of roof 2 days ago injured both arms but left is the worst. Reason for Disposition  [1] Large swelling or bruise around joint (wrist, elbow, shoulder) AND [2] can't move injured arm normally (bend or straighten completely)  Answer Assessment - Initial Assessment Questions 1. MECHANISM: How did the injury happen?     Fell from roof 2. ONSET: When did the injury happen? (e.g., minutes, hours ago)      2 days ago  3. LOCATION: Where is the injury located? Which arm?     left 4. APPEARANCE of INJURY: What does the injury look like?      Knot above wrist 5. SEVERITY: Can you use the arm normally?       6. SWELLING or BRUISING: is there any swelling or bruising? If Yes, ask: How large is it? (e.g., inches, centimeters)      Purple and swollen 7. PAIN: Is there pain? If Yes, ask: How bad is the pain? (Scale 0-10; or none, mild, moderate, severe)     severe 8. TETANUS: For any breaks in the skin, ask: When was your last tetanus booster?  Protocols used: Arm Injury-A-AH

## 2024-02-03 ENCOUNTER — Ambulatory Visit: Payer: 59 | Admitting: Adult Health

## 2024-02-08 ENCOUNTER — Other Ambulatory Visit: Payer: Self-pay | Admitting: Adult Health

## 2024-02-22 ENCOUNTER — Other Ambulatory Visit: Payer: Self-pay | Admitting: Family Medicine

## 2024-02-22 DIAGNOSIS — E785 Hyperlipidemia, unspecified: Secondary | ICD-10-CM

## 2024-03-08 ENCOUNTER — Other Ambulatory Visit: Payer: Self-pay | Admitting: Family Medicine

## 2024-04-05 ENCOUNTER — Other Ambulatory Visit: Payer: Self-pay | Admitting: Adult Health

## 2024-05-02 ENCOUNTER — Other Ambulatory Visit: Payer: Self-pay

## 2024-06-02 ENCOUNTER — Ambulatory Visit: Admitting: Family Medicine

## 2024-06-02 ENCOUNTER — Encounter: Payer: Self-pay | Admitting: Family Medicine

## 2024-06-02 VITALS — BP 109/65 | HR 60 | Ht 68.0 in | Wt 225.8 lb

## 2024-06-02 DIAGNOSIS — R0602 Shortness of breath: Secondary | ICD-10-CM

## 2024-06-02 DIAGNOSIS — M25511 Pain in right shoulder: Secondary | ICD-10-CM

## 2024-06-02 DIAGNOSIS — F5104 Psychophysiologic insomnia: Secondary | ICD-10-CM

## 2024-06-02 DIAGNOSIS — Z79899 Other long term (current) drug therapy: Secondary | ICD-10-CM

## 2024-06-02 DIAGNOSIS — J432 Centrilobular emphysema: Secondary | ICD-10-CM | POA: Diagnosis not present

## 2024-06-02 DIAGNOSIS — I110 Hypertensive heart disease with heart failure: Secondary | ICD-10-CM

## 2024-06-02 DIAGNOSIS — F3170 Bipolar disorder, currently in remission, most recent episode unspecified: Secondary | ICD-10-CM

## 2024-06-02 DIAGNOSIS — E785 Hyperlipidemia, unspecified: Secondary | ICD-10-CM | POA: Diagnosis not present

## 2024-06-02 DIAGNOSIS — I2510 Atherosclerotic heart disease of native coronary artery without angina pectoris: Secondary | ICD-10-CM | POA: Diagnosis not present

## 2024-06-02 DIAGNOSIS — R5383 Other fatigue: Secondary | ICD-10-CM | POA: Diagnosis not present

## 2024-06-02 MED ORDER — NITROGLYCERIN 0.4 MG SL SUBL: 0.4000 mg | SUBLINGUAL_TABLET | SUBLINGUAL | 5 refills | Status: AC | PRN

## 2024-06-02 NOTE — Patient Instructions (Signed)
 It was nice to see you today,  We addressed the following topics today: - I am sending in a refill of your nitroglycerin .  If your shortness of breath is due to coronary artery disease then taking this would help it.  - if your labs are okay I may send in a diuretic.  I will let you know when I get the results.    Have a great day,  Rolan Slain, MD

## 2024-06-03 ENCOUNTER — Ambulatory Visit: Payer: Self-pay | Admitting: Family Medicine

## 2024-06-03 ENCOUNTER — Ambulatory Visit: Payer: Self-pay | Admitting: *Deleted

## 2024-06-03 DIAGNOSIS — R0602 Shortness of breath: Secondary | ICD-10-CM | POA: Insufficient documentation

## 2024-06-03 NOTE — Progress Notes (Signed)
 "  Established Patient Office Visit  Subjective   Patient ID: [name], male    DOB: 12-15-1955  Age: 68 y.o. MRN: [id number]  Chief Complaint  Patient presents with   Medical Management of Chronic Issues    Discussed the use of AI scribe software for clinical note transcription with the patient, who gave verbal consent to proceed.  History of Present Illness   [name] is a 68 year old male with COPD and coronary artery disease who presents with shortness of breath.  They have been experiencing persistent shortness of breath for the past three to four months, described as a sensation of 'an elephant sitting on my chest.' The dyspnea is constant and worsens when lying flat, causing difficulty sleeping on their back. They also experience wheezing, particularly when supine, but there is no associated cough with sputum production. Despite using albuterol  inhalers four times a day and Trelegy once daily, they continue to have difficulty breathing.  They have a history of coronary artery disease with a previous myocardial infarction requiring the placement of three stents. They have not used nitroglycerin  recently.  They are unsure if their current symptoms are related to their cardiac condition.  he has COPD and use Trelegy and albuterol  inhalers as part of their management. Despite this, they continue to experience significant breathing difficulties. They have not noticed any swelling in their legs or hands, and their rings are not tight.  They have been experiencing sleep disturbances, describing their mind as 'like a video machine' that they cannot turn off, leading to racing thoughts and flashbacks. They have tried various medications for sleep, including Doaridorexant, olanzapine, hydroxyzine , and desvenlafaxine, but have not found them effective. They also take oxcarbazepine  for seizures, which they believe makes them drowsy.  Their current medication regimen includes  atorvastatin  for cholesterol, propranolol , and several medications for mental health and sleep issues, including Buspar , prazosin , and olanzapine. They have discontinued Seroquel  and have a supply of Trelegy inhalers at home.          The ASCVD Risk score (Arnett DK, et al., 2019) failed to calculate for the following reasons:   Risk score cannot be calculated because patient has a medical history suggesting prior/existing ASCVD   * - Cholesterol units were assumed  Health Maintenance Due  Topic Date Due   Pneumococcal Vaccine: 50+ Years (2 of 2 - PCV) 07/03/2014   COVID-19 Vaccine (2 - Pfizer risk series) 04/28/2024   Medicare Annual Wellness (AWV)  06/17/2024      Objective:     BP 109/65   Pulse 60   Ht 5' 8 (1.727 m)   Wt 225 lb 12.8 oz (102.4 kg)   SpO2 96%   BMI 34.33 kg/m    Physical Exam   VITALS: P- 70, BP- 134/72 gen: alert, oriented. no acute distress cv: rrr, no murmur pulm: minimal inspiratory crackles on the right lower lung field. no wheezing.  ext: no edema gi: soft, nondistended.           Assessment & Plan:   Coronary artery disease involving native coronary artery of native heart without angina pectoris Assessment & Plan: Stable coronary artery disease with previous myocardial infarction and stent placement. EKG unchanged. Nitroglycerin  may assess coronary-related symptoms. - Refill nitroglycerin  prescription. - Advise on nitroglycerin  use for angina symptoms.  Orders: -     EKG 12-Lead -     Brain natriuretic peptide -     TSH -  Iron, TIBC and Ferritin Panel -     Lipid panel -     Hemoglobin A1c -     Comprehensive metabolic panel with GFR  Medication management -     Nitroglycerin ; Place 1 tablet (0.4 mg total) under the tongue every 5 (five) minutes as needed for chest pain.  Dispense: 25 tablet; Refill: 5  Hypertensive heart disease with heart failure (HCC) -     Brain natriuretic peptide -     TSH -     Iron, TIBC and  Ferritin Panel -     Lipid panel -     Hemoglobin A1c -     Comprehensive metabolic panel with GFR  Bipolar disorder in partial remission, most recent episode unspecified type  Other fatigue -     Brain natriuretic peptide -     TSH -     Iron, TIBC and Ferritin Panel -     Hemoglobin A1c -     Comprehensive metabolic panel with GFR  Shortness of breath Assessment & Plan: Chronic dyspnea with possible coronary artery disease, heart failure, COPD exacerbation, or fluid retention. EKG today was unchanged from 1 year ago. has a hx of MI w/ multiple stents placed in the past. recent echo from last year was normal. only findings today suggestive of chf is mild right sided inspiratory crackles.  dyspnea could represent an angina equivalent.  does not appear to be d/t respiratory infection.  no wheezing to indicate copd exacerbation.. Discussed potential causes and treatments. - Order  blood tests including kidney function, potassium, BNP, and thyroid  function. - Consider chest x-ray if transportation feasible. pt mode of transportation is a motorized scooter. . - Prescribe nitroglycerin  and advise trial for symptom relief. - Consider diuretic therapy pending blood test results.  Orders: -     Brain natriuretic peptide -     TSH -     Iron, TIBC and Ferritin Panel -     Hemoglobin A1c -     Comprehensive metabolic panel with GFR  Centrilobular emphysema (HCC) Assessment & Plan: COPD with persistent dyspnea despite Trelegy and albuterol . No lung infection or pneumonia. - Continue Trelegy once daily. - Continue albuterol  inhaler as needed.   Psychophysiological insomnia Assessment & Plan: Chronic insomnia with inadequate relief from current medications. Reports racing thoughts and flashbacks. has ftried: melatonin, doxepin, atypical antipsychotics, orexin inhibitors, and is on multiple medications for anxiety. - Review current medication regimen with mental health provider. - Consider  alternative or adjunctive therapies for insomnia.   Hyperlipidemia, unspecified hyperlipidemia type Assessment & Plan: Ongoing management with atorvastatin . - Continue atorvastatin .    Pain in joint of right shoulder Assessment & Plan: Chronic shoulder and foot pain with some improvement. Discussed Celebrex for pain management based on anecdotal evidence. - Discuss potential use of Celebrex for pain management.           I spent 35 min in the mgmt of this pt     Return in about 3 months (around 08/31/2024) for dyspnea.    Toribio MARLA Slain, MD  "

## 2024-06-03 NOTE — Telephone Encounter (Signed)
 Instructed patient to call 911 , offered to call 911 and patient reports he will call. Reports he has someone at home with him. Unsure patient will call . Attempted to contact [facility] and no answer. Please advise.        FYI Only or Action Required?: FYI only for provider: ED advised and recommended to call 911.  Patient was last seen in primary care on 06/02/2024 by Chandra Toribio POUR, MD.  Called Nurse Triage reporting Chest Pain.  Symptoms began a week ago.  Interventions attempted: Prescription medications: see multiple inhalers on current med list .  Symptoms are: rapidly worsening.  Triage Disposition: Call EMS 911 Now  Patient/caregiver understands and will follow disposition?: Unsure                    Copied from CRM #[id number]. Topic: Clinical - Red Word Triage >> Jun 03, 2024 11:26 AM Tiffini S wrote: [facility] that prompted transfer to Nurse Triage: COPD started four months with worsening symptoms of hard time breathing- chest pain/ movement/ tightness- very uncomfortable  MRI completed yesterday- waiting for the results Reason for Disposition  [1] Chest pain lasts > 5 minutes AND [2] described as crushing, pressure-like, or heavy  Answer Assessment - Initial Assessment Questions Recommended calling 911. Offered to call for patient and patient declined. Reports someone is at home with him now. Continued multiple times to encourage patient to be evaluated in ED and call 911 now. Patient reports he is going to get things situated at home and then go . Recommended to call 911 ASAP.    Attempted to contact [facility] at 11:54 am to notify unsure patient will call 911. No answer.     1. LOCATION: Where does it hurt?       Center of chest  2. RADIATION: Does the pain go anywhere else? (e.g., into neck, jaw, arms, back)     Na  3. ONSET: When did the chest pain begin? (Minutes, hours or days)      1 week  4. PATTERN: Does the pain come and go, or has it  been constant since it started?  Does it get worse with exertion?      Constant  5. DURATION: How long does it last (e.g., seconds, minutes, hours)     On going x 1 week  6. SEVERITY: How bad is the pain?  (e.g., Scale 1-10; mild, moderate, or severe)     Feels like pressure like elephant sitting on chest 7. CARDIAC RISK FACTORS: Do you have any history of heart problems or risk factors for heart disease? (e.g., angina, prior heart attack; diabetes, high blood pressure, high cholesterol, smoker, or strong family history of heart disease)     Hx stents 8. PULMONARY RISK FACTORS: Do you have any history of lung disease?  (e.g., blood clots in lung, asthma, emphysema, birth control pills)     Hx COPD  9. CAUSE: What do you think is causing the chest pain?     Unsure  10. OTHER SYMPTOMS: Do you have any other symptoms? (e.g., dizziness, nausea, vomiting, sweating, fever, difficulty breathing, cough)       Chest pain with coughing feels like choking, can not breath through nose due to broken before. SOB at rest, sitting up in recliner now chest pain pressure like . COPD and has been taking inhalers and using more than prescribed.  11. PREGNANCY: Is there any chance you are pregnant? When was your last menstrual  period?       na  Protocols used: Chest Pain-A-AH

## 2024-06-03 NOTE — Assessment & Plan Note (Signed)
 Stable coronary artery disease with previous myocardial infarction and stent placement. EKG unchanged. Nitroglycerin  may assess coronary-related symptoms. - Refill nitroglycerin  prescription. - Advise on nitroglycerin  use for angina symptoms.

## 2024-06-03 NOTE — Assessment & Plan Note (Signed)
 COPD with persistent dyspnea despite Trelegy and albuterol . No lung infection or pneumonia. - Continue Trelegy once daily. - Continue albuterol  inhaler as needed.

## 2024-06-03 NOTE — Telephone Encounter (Signed)
 Called pt.  Did not get an answer.  Left a voicemail stating I agree he should go to the ED if he has not yet already.

## 2024-06-03 NOTE — Assessment & Plan Note (Signed)
 Chronic shoulder and foot pain with some improvement. Discussed Celebrex for pain management based on anecdotal evidence. - Discuss potential use of Celebrex for pain management.

## 2024-06-03 NOTE — Assessment & Plan Note (Signed)
 Chronic dyspnea with possible coronary artery disease, heart failure, COPD exacerbation, or fluid retention. EKG today was unchanged from 1 year ago. has a hx of MI w/ multiple stents placed in the past. recent echo from last year was normal. only findings today suggestive of chf is mild right sided inspiratory crackles.  dyspnea could represent an angina equivalent.  does not appear to be d/t respiratory infection.  no wheezing to indicate copd exacerbation.. Discussed potential causes and treatments. - Order  blood tests including kidney function, potassium, BNP, and thyroid  function. - Consider chest x-ray if transportation feasible. pt mode of transportation is a motorized scooter. . - Prescribe nitroglycerin  and advise trial for symptom relief. - Consider diuretic therapy pending blood test results.

## 2024-06-03 NOTE — Assessment & Plan Note (Signed)
 Chronic insomnia with inadequate relief from current medications. Reports racing thoughts and flashbacks. has ftried: melatonin, doxepin, atypical antipsychotics, orexin inhibitors, and is on multiple medications for anxiety. - Review current medication regimen with mental health provider. - Consider alternative or adjunctive therapies for insomnia.

## 2024-06-03 NOTE — Assessment & Plan Note (Signed)
 Ongoing management with atorvastatin . - Continue atorvastatin .

## 2024-06-06 ENCOUNTER — Telehealth: Payer: Self-pay

## 2024-06-06 LAB — COMPREHENSIVE METABOLIC PANEL WITH GFR
ALT: 42 IU/L (ref 0–44)
AST: 30 IU/L (ref 0–40)
Albumin: 4.7 g/dL (ref 3.9–4.9)
Alkaline Phosphatase: 115 IU/L (ref 47–123)
BUN/Creatinine Ratio: 22 (ref 10–24)
BUN: 18 mg/dL (ref 8–27)
Bilirubin Total: 0.4 mg/dL (ref 0.0–1.2)
CO2: 19 mmol/L — ABNORMAL LOW (ref 20–29)
Calcium: 9.5 mg/dL (ref 8.6–10.2)
Chloride: 109 mmol/L — ABNORMAL HIGH (ref 96–106)
Creatinine, Ser: 0.82 mg/dL (ref 0.76–1.27)
Globulin, Total: 2.1 g/dL (ref 1.5–4.5)
Glucose: 85 mg/dL (ref 70–99)
Potassium: 4.2 mmol/L (ref 3.5–5.2)
Sodium: 141 mmol/L (ref 134–144)
Total Protein: 6.8 g/dL (ref 6.0–8.5)
eGFR: 94 mL/min/1.73

## 2024-06-06 LAB — IRON,TIBC AND FERRITIN PANEL
Ferritin: 190 ng/mL (ref 30–400)
Iron Saturation: 55 % (ref 15–55)
Iron: 170 ug/dL — ABNORMAL HIGH (ref 38–169)
Total Iron Binding Capacity: 307 ug/dL (ref 250–450)
UIBC: 137 ug/dL (ref 111–343)

## 2024-06-06 LAB — TSH: TSH: 2.34 u[IU]/mL (ref 0.450–4.500)

## 2024-06-06 LAB — HEMOGLOBIN A1C
Est. average glucose Bld gHb Est-mCnc: 120 mg/dL
Hgb A1c MFr Bld: 5.8 % — ABNORMAL HIGH (ref 4.8–5.6)

## 2024-06-06 LAB — LIPID PANEL
Chol/HDL Ratio: 3.2 ratio (ref 0.0–5.0)
Cholesterol, Total: 130 mg/dL (ref 100–199)
HDL: 41 mg/dL
LDL Chol Calc (NIH): 72 mg/dL (ref 0–99)
Triglycerides: 90 mg/dL (ref 0–149)
VLDL Cholesterol Cal: 17 mg/dL (ref 5–40)

## 2024-06-06 LAB — BRAIN NATRIURETIC PEPTIDE: BNP: 56 pg/mL (ref 0.0–100.0)

## 2024-06-06 NOTE — Telephone Encounter (Signed)
 Called [facility] she stated that it will be resulted by the end of the day

## 2024-06-13 NOTE — Progress Notes (Signed)
 Called pt he is advised of his lab results and recommendation pt stated that he is still having difficultly breathing and he said that he did not go to UC  I express that he should go .

## 2024-06-23 ENCOUNTER — Ambulatory Visit: Payer: 59

## 2024-06-29 ENCOUNTER — Other Ambulatory Visit: Payer: Self-pay | Admitting: Family Medicine

## 2024-08-31 ENCOUNTER — Ambulatory Visit: Admitting: Family Medicine

## 2024-12-21 ENCOUNTER — Ambulatory Visit: Admitting: Adult Health
